# Patient Record
Sex: Male | Born: 1950 | Race: Black or African American | Hispanic: No | Marital: Single | Smoking: Former smoker
Health system: Southern US, Community
[De-identification: ages and names within clinical notes are randomized; demographics above are authoritative.]

## PROBLEM LIST (undated history)

## (undated) DIAGNOSIS — I5189 Other ill-defined heart diseases: Secondary | ICD-10-CM

## (undated) DIAGNOSIS — N183 Chronic kidney disease, stage 3 unspecified: Secondary | ICD-10-CM

## (undated) DIAGNOSIS — R197 Diarrhea, unspecified: Secondary | ICD-10-CM

## (undated) DIAGNOSIS — D638 Anemia in other chronic diseases classified elsewhere: Secondary | ICD-10-CM

## (undated) DIAGNOSIS — E119 Type 2 diabetes mellitus without complications: Secondary | ICD-10-CM

## (undated) DIAGNOSIS — I951 Orthostatic hypotension: Secondary | ICD-10-CM

## (undated) DIAGNOSIS — D472 Monoclonal gammopathy: Secondary | ICD-10-CM

## (undated) DIAGNOSIS — I10 Essential (primary) hypertension: Secondary | ICD-10-CM

## (undated) DIAGNOSIS — R55 Syncope and collapse: Secondary | ICD-10-CM

## (undated) DIAGNOSIS — E039 Hypothyroidism, unspecified: Secondary | ICD-10-CM

## (undated) DIAGNOSIS — H5440 Blindness, one eye, unspecified eye: Secondary | ICD-10-CM

## (undated) HISTORY — DX: Syncope and collapse: R55

## (undated) HISTORY — DX: Chronic kidney disease, stage 3 unspecified: N18.30

## (undated) HISTORY — DX: Chronic kidney disease, stage 3 (moderate): N18.3

## (undated) HISTORY — DX: Blindness, one eye, unspecified eye: H54.40

## (undated) HISTORY — DX: Type 2 diabetes mellitus without complications: E11.9

## (undated) HISTORY — DX: Essential (primary) hypertension: I10

## (undated) HISTORY — PX: THYROID SURGERY: SHX805

---

## 2006-07-14 ENCOUNTER — Other Ambulatory Visit: Payer: Self-pay

## 2006-07-14 ENCOUNTER — Inpatient Hospital Stay: Payer: Self-pay | Admitting: Internal Medicine

## 2011-04-10 ENCOUNTER — Emergency Department: Payer: Self-pay | Admitting: Emergency Medicine

## 2012-04-04 ENCOUNTER — Emergency Department: Payer: Self-pay | Admitting: Internal Medicine

## 2013-04-18 DIAGNOSIS — N62 Hypertrophy of breast: Secondary | ICD-10-CM | POA: Insufficient documentation

## 2013-06-16 DIAGNOSIS — H44522 Atrophy of globe, left eye: Secondary | ICD-10-CM | POA: Insufficient documentation

## 2013-06-16 DIAGNOSIS — E11319 Type 2 diabetes mellitus with unspecified diabetic retinopathy without macular edema: Secondary | ICD-10-CM | POA: Insufficient documentation

## 2013-10-10 ENCOUNTER — Inpatient Hospital Stay: Payer: Self-pay | Admitting: Internal Medicine

## 2013-10-10 LAB — BASIC METABOLIC PANEL
ANION GAP: 5 — AB (ref 7–16)
BUN: 26 mg/dL — ABNORMAL HIGH (ref 7–18)
CHLORIDE: 88 mmol/L — AB (ref 98–107)
Calcium, Total: 9.2 mg/dL (ref 8.5–10.1)
Co2: 28 mmol/L (ref 21–32)
Creatinine: 2.31 mg/dL — ABNORMAL HIGH (ref 0.60–1.30)
EGFR (African American): 34 — ABNORMAL LOW
EGFR (Non-African Amer.): 29 — ABNORMAL LOW
Glucose: 693 mg/dL (ref 65–99)
OSMOLALITY: 282 (ref 275–301)
Potassium: 6.6 mmol/L (ref 3.5–5.1)
SODIUM: 121 mmol/L — AB (ref 136–145)

## 2013-10-10 LAB — CBC
HCT: 40.6 % (ref 40.0–52.0)
HGB: 13.2 g/dL (ref 13.0–18.0)
MCH: 26.9 pg (ref 26.0–34.0)
MCHC: 32.5 g/dL (ref 32.0–36.0)
MCV: 83 fL (ref 80–100)
PLATELETS: 217 10*3/uL (ref 150–440)
RBC: 4.91 10*6/uL (ref 4.40–5.90)
RDW: 12.5 % (ref 11.5–14.5)
WBC: 17 10*3/uL — ABNORMAL HIGH (ref 3.8–10.6)

## 2013-10-10 LAB — TROPONIN I
TROPONIN-I: 0.05 ng/mL
TROPONIN-I: 0.05 ng/mL
Troponin-I: 0.05 ng/mL

## 2013-10-10 LAB — CK TOTAL AND CKMB (NOT AT ARMC)
CK, TOTAL: 101 U/L (ref 35–232)
CK, TOTAL: 102 U/L (ref 35–232)
CK, Total: 95 U/L (ref 35–232)
CK-MB: 0.7 ng/mL (ref 0.5–3.6)
CK-MB: 0.7 ng/mL (ref 0.5–3.6)
CK-MB: 0.7 ng/mL (ref 0.5–3.6)

## 2013-10-10 LAB — RAPID INFLUENZA A&B ANTIGENS (ARMC ONLY)

## 2013-10-10 LAB — POTASSIUM: POTASSIUM: 5.3 mmol/L — AB (ref 3.5–5.1)

## 2013-10-11 LAB — CBC WITH DIFFERENTIAL/PLATELET
BASOS PCT: 0.1 %
Basophil #: 0 10*3/uL (ref 0.0–0.1)
EOS ABS: 0 10*3/uL (ref 0.0–0.7)
Eosinophil %: 0.2 %
HCT: 33.8 % — ABNORMAL LOW (ref 40.0–52.0)
HGB: 11.3 g/dL — ABNORMAL LOW (ref 13.0–18.0)
LYMPHS ABS: 0.8 10*3/uL — AB (ref 1.0–3.6)
Lymphocyte %: 4.3 %
MCH: 27.4 pg (ref 26.0–34.0)
MCHC: 33.4 g/dL (ref 32.0–36.0)
MCV: 82 fL (ref 80–100)
Monocyte #: 0.8 x10 3/mm (ref 0.2–1.0)
Monocyte %: 4.5 %
NEUTROS PCT: 90.9 %
Neutrophil #: 16.2 10*3/uL — ABNORMAL HIGH (ref 1.4–6.5)
Platelet: 182 10*3/uL (ref 150–440)
RBC: 4.12 10*6/uL — ABNORMAL LOW (ref 4.40–5.90)
RDW: 12.6 % (ref 11.5–14.5)
WBC: 17.9 10*3/uL — ABNORMAL HIGH (ref 3.8–10.6)

## 2013-10-11 LAB — BASIC METABOLIC PANEL
Anion Gap: 4 — ABNORMAL LOW (ref 7–16)
BUN: 30 mg/dL — AB (ref 7–18)
CHLORIDE: 98 mmol/L (ref 98–107)
Calcium, Total: 8.5 mg/dL (ref 8.5–10.1)
Co2: 31 mmol/L (ref 21–32)
Creatinine: 2.25 mg/dL — ABNORMAL HIGH (ref 0.60–1.30)
EGFR (African American): 35 — ABNORMAL LOW
EGFR (Non-African Amer.): 30 — ABNORMAL LOW
Glucose: 121 mg/dL — ABNORMAL HIGH (ref 65–99)
OSMOLALITY: 274 (ref 275–301)
POTASSIUM: 3.7 mmol/L (ref 3.5–5.1)
SODIUM: 133 mmol/L — AB (ref 136–145)

## 2013-10-12 LAB — CBC WITH DIFFERENTIAL/PLATELET
Basophil #: 0 10*3/uL (ref 0.0–0.1)
Basophil %: 0.1 %
Eosinophil #: 0.1 10*3/uL (ref 0.0–0.7)
Eosinophil %: 0.6 %
HCT: 30.8 % — ABNORMAL LOW (ref 40.0–52.0)
HGB: 10.1 g/dL — AB (ref 13.0–18.0)
Lymphocyte #: 0.9 10*3/uL — ABNORMAL LOW (ref 1.0–3.6)
Lymphocyte %: 4.6 %
MCH: 26.7 pg (ref 26.0–34.0)
MCHC: 33 g/dL (ref 32.0–36.0)
MCV: 81 fL (ref 80–100)
Monocyte #: 1.3 x10 3/mm — ABNORMAL HIGH (ref 0.2–1.0)
Monocyte %: 6.9 %
NEUTROS ABS: 16.4 10*3/uL — AB (ref 1.4–6.5)
NEUTROS PCT: 87.8 %
PLATELETS: 174 10*3/uL (ref 150–440)
RBC: 3.79 10*6/uL — ABNORMAL LOW (ref 4.40–5.90)
RDW: 12.5 % (ref 11.5–14.5)
WBC: 18.7 10*3/uL — ABNORMAL HIGH (ref 3.8–10.6)

## 2013-10-12 LAB — BASIC METABOLIC PANEL
ANION GAP: 4 — AB (ref 7–16)
BUN: 21 mg/dL — AB (ref 7–18)
CHLORIDE: 103 mmol/L (ref 98–107)
Calcium, Total: 8.1 mg/dL — ABNORMAL LOW (ref 8.5–10.1)
Co2: 29 mmol/L (ref 21–32)
Creatinine: 1.75 mg/dL — ABNORMAL HIGH (ref 0.60–1.30)
EGFR (African American): 47 — ABNORMAL LOW
GFR CALC NON AF AMER: 41 — AB
GLUCOSE: 93 mg/dL (ref 65–99)
Osmolality: 275 (ref 275–301)
POTASSIUM: 3.3 mmol/L — AB (ref 3.5–5.1)
Sodium: 136 mmol/L (ref 136–145)

## 2013-10-13 DIAGNOSIS — I517 Cardiomegaly: Secondary | ICD-10-CM

## 2013-10-13 LAB — CREATININE, SERUM
Creatinine: 1.65 mg/dL — ABNORMAL HIGH (ref 0.60–1.30)
GFR CALC AF AMER: 50 — AB
GFR CALC NON AF AMER: 44 — AB

## 2013-10-14 LAB — CBC WITH DIFFERENTIAL/PLATELET
Basophil #: 0 10*3/uL (ref 0.0–0.1)
Basophil %: 0.2 %
Eosinophil #: 0.1 10*3/uL (ref 0.0–0.7)
Eosinophil %: 0.9 %
HCT: 31.1 % — ABNORMAL LOW (ref 40.0–52.0)
HGB: 10.3 g/dL — ABNORMAL LOW (ref 13.0–18.0)
LYMPHS PCT: 5 %
Lymphocyte #: 0.6 10*3/uL — ABNORMAL LOW (ref 1.0–3.6)
MCH: 26.9 pg (ref 26.0–34.0)
MCHC: 33.1 g/dL (ref 32.0–36.0)
MCV: 81 fL (ref 80–100)
MONOS PCT: 12.3 %
Monocyte #: 1.5 x10 3/mm — ABNORMAL HIGH (ref 0.2–1.0)
Neutrophil #: 9.9 10*3/uL — ABNORMAL HIGH (ref 1.4–6.5)
Neutrophil %: 81.6 %
Platelet: 197 10*3/uL (ref 150–440)
RBC: 3.83 10*6/uL — ABNORMAL LOW (ref 4.40–5.90)
RDW: 13 % (ref 11.5–14.5)
WBC: 12.1 10*3/uL — ABNORMAL HIGH (ref 3.8–10.6)

## 2013-10-14 LAB — BASIC METABOLIC PANEL
Anion Gap: 5 — ABNORMAL LOW (ref 7–16)
BUN: 11 mg/dL (ref 7–18)
CHLORIDE: 106 mmol/L (ref 98–107)
CO2: 27 mmol/L (ref 21–32)
Calcium, Total: 8.1 mg/dL — ABNORMAL LOW (ref 8.5–10.1)
Creatinine: 1.61 mg/dL — ABNORMAL HIGH (ref 0.60–1.30)
EGFR (Non-African Amer.): 45 — ABNORMAL LOW
GFR CALC AF AMER: 52 — AB
Glucose: 104 mg/dL — ABNORMAL HIGH (ref 65–99)
Osmolality: 275 (ref 275–301)
POTASSIUM: 3.9 mmol/L (ref 3.5–5.1)
Sodium: 138 mmol/L (ref 136–145)

## 2013-10-14 LAB — VANCOMYCIN, TROUGH: Vancomycin, Trough: 11 ug/mL (ref 10–20)

## 2013-10-15 LAB — CULTURE, BLOOD (SINGLE)

## 2013-10-16 LAB — CREATININE, SERUM
Creatinine: 1.52 mg/dL — ABNORMAL HIGH (ref 0.60–1.30)
GFR CALC AF AMER: 56 — AB
GFR CALC NON AF AMER: 48 — AB

## 2013-10-16 LAB — VANCOMYCIN, TROUGH: Vancomycin, Trough: 18 ug/mL (ref 10–20)

## 2013-10-20 LAB — EXPECTORATED SPUTUM ASSESSMENT W REFEX TO RESP CULTURE

## 2013-10-31 ENCOUNTER — Encounter: Payer: Self-pay | Admitting: Pulmonary Disease

## 2013-10-31 ENCOUNTER — Ambulatory Visit (INDEPENDENT_AMBULATORY_CARE_PROVIDER_SITE_OTHER): Payer: Medicare Other | Admitting: Pulmonary Disease

## 2013-10-31 VITALS — BP 116/60 | HR 85 | Temp 98.4°F | Ht 70.0 in | Wt 178.0 lb

## 2013-10-31 DIAGNOSIS — R079 Chest pain, unspecified: Secondary | ICD-10-CM

## 2013-10-31 DIAGNOSIS — J9611 Chronic respiratory failure with hypoxia: Secondary | ICD-10-CM | POA: Insufficient documentation

## 2013-10-31 DIAGNOSIS — R0902 Hypoxemia: Secondary | ICD-10-CM

## 2013-10-31 DIAGNOSIS — J9610 Chronic respiratory failure, unspecified whether with hypoxia or hypercapnia: Secondary | ICD-10-CM

## 2013-10-31 DIAGNOSIS — J189 Pneumonia, unspecified organism: Secondary | ICD-10-CM | POA: Insufficient documentation

## 2013-10-31 NOTE — Assessment & Plan Note (Signed)
His chest pain is atypical but it is left-sided and exertional so with his prior smoking history I think it is reasonable for him to see doctors Rockey Situ and Fletcher Anon for possibly a stress test or further workup.

## 2013-10-31 NOTE — Patient Instructions (Signed)
Go get a chest x-ray at the [facility] office this week Use Mucinex (guaifenesin) liquid or tablet twice a day as needed to help break up the mucus in your chest We will refer you to Dr. Rockey Situ and Fletcher Anon for the chest pain We will see you back in 2-3 months or sooner if needed

## 2013-10-31 NOTE — Assessment & Plan Note (Signed)
Cameron Adams had severe community acquired pneumonia but is making slow steady progress since leaving the hospital.  Today we checked for COPD with simple spirometry and it was normal so he does not have COPD.  It appears that he is progressing as one would expect a 63 year old gentleman with poorly controlled diabetes to progress after severe community acquired pneumonia. In fact, I think he is lucky that he survive this illness. I am happy to see that he has made slow progress.  Plan: - Use Mucinex with chest congestion -Repeat chest x-ray now to ensure that radiographic changes are not worsens -Plan repeat chest x-ray in 2-3 months with an office visit, hopefully by then we will see complete resolution

## 2013-10-31 NOTE — Assessment & Plan Note (Signed)
We checked his oxygenation today in the office and he still needs 2 L nasal cannula continuously with both rest and exertion. In fact, his O2 saturation was 89% on room air but with any movement he would drop to think it's best for him to stay on continuous therapy at this point.

## 2013-10-31 NOTE — Progress Notes (Signed)
Subjective:    Patient ID: Cameron Adams, male    DOB: 02-02-1951, 63 y.o.   MRN: [id number]  HPI  Cameron Adams is here to see me for follow up from a recent hospitalziation for severe pneumonia. On Christmas day he got cold symptoms and he started taking over the counter cough and cold medicines.  He took the medicines but his symptoms worsened.  He started taking mucinex in addition and tried this with other OTC medicines for a week at home. None of the medicines helped so he ended up going to the [facility] ED and was admitted.  Apparently his blood sugar went up a lot just before the admission.  He was admitted to the ICU and was treated with IV insulin.  His total hospital stay was 7 days and discharged to a SNF.  He has been at the SNF for three weeks now.  He has been participating in physical therapy daily and he has been taking his medicines regularly.  His appetite has improved quite a bit.   He still has some problems with shortness of breath on exertion.  He feels that he is getting stronger and yesterday he was able to leave the facility and make it out for a few hours without oxygen.  He is still coughing but not as bad as before.  He feels chest congestion is taking a while to get out.  He still sees yellow sputum.   Before going to the hospital he noticed regurgitation of meats and acid reflux.  He treated this by avoiding foods that would cause reflux.   Childhood was normal without asthma.  He had pneumonia once in his early 20's and he was hospitalized for this.  Apparently he underwent a bronchoscopy at [facility] as an inpatient for this.    He previously smoked about 3 cigarettes a day and quit in his late 30's-early 40's, some days he would smoke more than this.  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  No respiratory problems after this.     He feels chest pressure in the left chest and shoulder with exertion.  This has been going on for a few months.     Past Medical History  Diagnosis Date  . Diabetes   . Thyroid dysfunction      Family History  Problem Relation Age of Onset  . Heart disease Brother   . Heart disease Mother      History   Social History  . Marital Status: Single    Spouse Name: N/A    Number of Children: N/A  . Years of Education: N/A   Occupational History  . Not on file.   Social History Main Topics  . Smoking status: Former Smoker -- 0.10 packs/day for 12 years    Types: Cigarettes    Quit date: 10/31/1978  . Smokeless tobacco: Never Used     Comment: smoked 2-3 cigarettes/day  . Alcohol Use: No  . Drug Use: Not on file  . Sexual Activity: Not on file   Other Topics Concern  . Not on file   Social History Narrative  . No narrative on file     Not on File   No outpatient prescriptions prior to visit.   No facility-administered medications prior to visit.      Review of Systems  Constitutional: Positive for appetite change and unexpected weight change. Negative for fever.  HENT: Positive for dental problem. Negative for congestion, ear pain, nosebleeds, postnasal drip, rhinorrhea, sinus pressure, sneezing, sore throat and trouble swallowing.   Eyes: Negative for redness and itching.  Respiratory: Positive for shortness of breath. Negative for cough, chest tightness and wheezing.   Cardiovascular: Positive for leg swelling. Negative for palpitations.  Gastrointestinal: Negative for nausea and vomiting.  Genitourinary: Negative for dysuria.  Musculoskeletal: Positive for joint swelling.  Skin: Negative for rash.  Neurological: Negative for headaches.  Hematological: Does not bruise/bleed easily.  Psychiatric/Behavioral: Negative for dysphoric mood. The patient is not nervous/anxious.        Objective:   Physical Exam  Filed Vitals:   10/31/13 1439  BP: 116/60   Pulse: 85  Temp: 98.4 F (36.9 C)  TempSrc: Oral  Height: 5\' 10"  (1.778 m)  Weight: 178 lb (80.74 kg)  SpO2: 95%  2 L [place]  Gen: well appearing, no acute distress HEENT: NCAT, PERRL, EOMi, OP clear, neck supple without masses PULM: CTA B CV: RRR, no mgr, no JVD AB: BS+, soft, nontender, no hsm Ext: warm, no edema, no clubbing, no cyanosis Derm: no rash or skin breakdown Neuro: A&Ox4, CN II-XII intact, strength 5/5 in all 4 extremities       Assessment & Plan:   CAP (community acquired pneumonia) Cameron Adams had severe community acquired pneumonia but is making slow steady progress since leaving the hospital.  Today we checked for COPD with simple spirometry and it was normal so he does not have COPD.  It appears that he is progressing as one would expect a 63 year old gentleman with poorly controlled diabetes to progress after severe community acquired pneumonia. In fact, I think he is lucky that he survive this illness. I am happy to see that he has made slow progress.  Plan: - Use Mucinex with chest congestion -Repeat chest x-ray now to ensure that radiographic changes are not worsens -Plan repeat chest x-ray in 2-3 months with an office visit, hopefully by then we will see complete resolution  Chronic respiratory failure with hypoxia We checked his oxygenation today in the office and he still needs 2 L nasal cannula continuously with both rest and exertion. In fact, his O2 saturation was 89% on room air but with any movement he would drop to think it's best for him to stay on continuous therapy at this point.  Chest pain His chest pain is atypical but it is left-sided and exertional so with his prior smoking history I think it is reasonable for him to see doctors Rockey Situ and Arida for possibly a stress test or further workup.   Updated Medication List Outpatient Encounter Prescriptions as of 10/31/2013  Medication Sig  . aspirin 81 MG tablet Take 81 mg by mouth daily.   . citalopram (CELEXA) 20 MG tablet Take 20 mg by mouth daily.  . insulin aspart protamine- aspart (NOVOLOG MIX 70/30) (70-30) 100 UNIT/ML injection Inject into the skin 2 (two) times daily with a meal. 40 units a.m., 25 units p.m.  Marland Kitchen levothyroxine (SYNTHROID, LEVOTHROID) 175 MCG tablet Take 175 mcg by mouth daily before breakfast.

## 2013-11-01 ENCOUNTER — Ambulatory Visit: Payer: Medicare Other | Admitting: Cardiology

## 2013-11-07 ENCOUNTER — Encounter: Payer: Self-pay | Admitting: Pulmonary Disease

## 2013-11-07 ENCOUNTER — Telehealth: Payer: Self-pay

## 2013-11-07 NOTE — Telephone Encounter (Signed)
lmtcb X1 

## 2013-11-07 NOTE — Telephone Encounter (Signed)
Message copied by Len Blalock on Mon Nov 07, 2013 10:20 AM ------      Message from: Simonne Maffucci B      Created: Mon Nov 07, 2013 10:00 AM       A,            Please let him know that his CXR showed that his pneumonia is clearing up slowly.  We will get another CXR with the next visit.            Thanks      B ------

## 2013-11-07 NOTE — Telephone Encounter (Signed)
Pt return call °

## 2013-11-07 NOTE — Telephone Encounter (Signed)
Pt aware of cxr results and recs.  Nothing further needed.

## 2013-11-09 ENCOUNTER — Encounter: Payer: Self-pay | Admitting: Cardiovascular Disease

## 2013-11-09 ENCOUNTER — Ambulatory Visit (INDEPENDENT_AMBULATORY_CARE_PROVIDER_SITE_OTHER): Payer: Medicare Other | Admitting: Cardiovascular Disease

## 2013-11-09 VITALS — BP 92/50 | HR 82 | Ht 71.0 in | Wt 178.8 lb

## 2013-11-09 DIAGNOSIS — R0989 Other specified symptoms and signs involving the circulatory and respiratory systems: Secondary | ICD-10-CM

## 2013-11-09 DIAGNOSIS — R9431 Abnormal electrocardiogram [ECG] [EKG]: Secondary | ICD-10-CM

## 2013-11-09 DIAGNOSIS — R0609 Other forms of dyspnea: Secondary | ICD-10-CM

## 2013-11-09 DIAGNOSIS — R0600 Dyspnea, unspecified: Secondary | ICD-10-CM

## 2013-11-09 DIAGNOSIS — R0602 Shortness of breath: Secondary | ICD-10-CM

## 2013-11-09 NOTE — Assessment & Plan Note (Signed)
Cameron Adams presents today for further evaluation of his dyspnea. He was recently admitted to the hospital with pneumonia. He complains of having shortness of breath prior to his hospitalization and also since his hospitalization. He has been seen by Dr. Lake Bells of pulmonary and was told that he did not have COPD.  He does have some nonspecific ST changes in the inferior leads of  his EKG.  We will schedule him for a Lexiscan Myoview.  Given his dyspnea, we will also get an echo.    I will see him again in 2 months.

## 2013-11-09 NOTE — Patient Instructions (Addendum)
Your physician has requested that you have an echocardiogram. Echocardiography is a painless test that uses sound waves to create images of your heart. It provides your doctor with information about the size and shape of your heart and how well your heart's chambers and valves are working. This procedure takes approximately one hour. There are no restrictions for this procedure.  [place]  Your caregiver has ordered a Stress Test with nuclear imaging. The purpose of this test is to evaluate the blood supply to your heart muscle. This procedure is referred to as a "Non-Invasive Stress Test." This is because other than having an IV started in your vein, nothing is inserted or "invades" your body. Cardiac stress tests are done to find areas of poor blood flow to the heart by determining the extent of coronary artery disease (CAD). Some patients exercise on a treadmill, which naturally increases the blood flow to your heart, while others who are  unable to walk on a treadmill due to physical limitations have a pharmacologic/chemical stress agent called Lexiscan . This medicine will mimic walking on a treadmill by temporarily increasing your coronary blood flow.   Please note: these test may take anywhere between 2-4 hours to complete  PLEASE REPORT TO [place] AT THE FIRST DESK WILL DIRECT YOU WHERE TO GO  Date of Procedure:___________02/11/15__________________  Arrival Time for Procedure:_________10:00 am___________   PLEASE NOTIFY THE OFFICE AT LEAST 24 HOURS IN ADVANCE IF YOU ARE UNABLE TO KEEP YOUR APPOINTMENT.  [phone] AND  PLEASE NOTIFY NUCLEAR MEDICINE AT [facility] AT LEAST 24 HOURS IN ADVANCE IF YOU ARE UNABLE TO KEEP YOUR APPOINTMENT. [phone]     Hold insulin the morning of your stress test!   How to prepare for your Myoview test:  1. Do not eat or drink after midnight 2. No caffeine for 24 hours prior to test 3. No smoking 24 hours prior  to test. 4. Your medication may be taken with water.  If your doctor stopped a medication because of this test, do not take that medication. 5. Ladies, please do not wear dresses.  Skirts or pants are appropriate. Please wear a short sleeve shirt. 6. No perfume, cologne or lotion. 7. Wear comfortable walking shoes. No heels!    Your physician recommends that you schedule a follow-up appointment in:  2 months   Call Elmyra Ricks at [phone] if you can not do stress test on Wednesday 11/16/13

## 2013-11-09 NOTE — Progress Notes (Signed)
Cameron Adams Date of Birth  Mar 18, 1951       [id number], [address], [place], [place]  [address]   [place], [place]  [address] [place]   Fax  [phone]    Fax [phone]  Problem List: 1. Diabetes mellitus 2. Hypothyroidism 3. Anxiety 4. Hypertension 5. Chronic kidney disease, stage III   History of Present Illness:  Feb. 4, 2015:  Cameron Adams is a 63 yo with hx of DM, HTN, CKD.  He was admitted recently to [facility] with pneumonia. He still has occasional cough - not often.   He was concerned about his heart.   He presents today mostly as a self referral.   He still has dyspnea  - especially with exertion.    Denies any chest pain.  He just started getting exercise - PT 7 days a week.  He is semi-retired.    Current Outpatient Prescriptions on File Prior to Visit  Medication Sig Dispense Refill  . aspirin 81 MG tablet Take 81 mg by mouth daily.      . citalopram (CELEXA) 20 MG tablet Take 20 mg by mouth daily.      . insulin aspart protamine- aspart (NOVOLOG MIX 70/30) (70-30) 100 UNIT/ML injection Inject into the skin 2 (two) times daily with a meal. 40 units a.m., 25 units p.m.      Marland Kitchen levothyroxine (SYNTHROID, LEVOTHROID) 175 MCG tablet Take 175 mcg by mouth daily before breakfast.       No current facility-administered medications on file prior to visit.    No Known Allergies  Past Medical History  Diagnosis Date  . Diabetes   . Thyroid dysfunction   . Syncope and collapse   . Hypertension   . Blindness of left eye   . Chronic kidney disease, stage III (moderate)     Past Surgical History  Procedure Laterality Date  . Thyroid surgery      History  Smoking status  . Former Smoker -- 0.10 packs/day for 25 years  . Types: Cigarettes  . Quit date: 10/31/1978  Smokeless tobacco  . Never Used    Comment: smoked 2-3 cigarettes/day    History  Alcohol Use No     Family History  Problem Relation Age of Onset  . Heart disease Brother   . Kidney failure Mother   . Heart disease Father   . Kidney failure Father     Reviw of Systems:  Reviewed in the HPI.  All other systems are negative.  Physical Exam: Blood pressure 92/50, pulse 82, height 5\' 11"  (1.803 m), weight 178 lb 12 oz (81.08 kg). Wt Readings from Last 3 Encounters:  11/09/13 178 lb 12 oz (81.08 kg)  10/31/13 178 lb (80.74 kg)     General: Well developed, well nourished, in no acute distress. Head: Normocephalic,  Blind in left eye, mucus membranes are moist,  Neck: Supple. Carotids are 2 + without bruits. No JVD  Lungs: Clear  Heart: RR, soft systolic murmur Abdomen: Soft, non-tender, non-distended with normal bowel sounds. Msk:  Strength and tone are normal  Extremities: No clubbing or cyanosis. No edema.  Distal pedal pulses are 2+ and equal  Neuro: CN II - XII intact.  Alert and oriented X 3.  Psych:  Normal   ECG: Feb. 4, 2015:  NSR at 83.  T wave abn. Inferiorly.  RVH.  Assessment / Plan:

## 2013-11-16 ENCOUNTER — Ambulatory Visit: Payer: Self-pay

## 2013-11-17 ENCOUNTER — Other Ambulatory Visit: Payer: Self-pay

## 2013-11-17 DIAGNOSIS — R0602 Shortness of breath: Secondary | ICD-10-CM

## 2013-11-17 DIAGNOSIS — R9431 Abnormal electrocardiogram [ECG] [EKG]: Secondary | ICD-10-CM

## 2013-11-18 ENCOUNTER — Other Ambulatory Visit (INDEPENDENT_AMBULATORY_CARE_PROVIDER_SITE_OTHER): Payer: Medicare Other

## 2013-11-18 ENCOUNTER — Other Ambulatory Visit (HOSPITAL_COMMUNITY): Payer: Self-pay | Admitting: *Deleted

## 2013-11-18 ENCOUNTER — Other Ambulatory Visit: Payer: Self-pay

## 2013-11-18 ENCOUNTER — Telehealth: Payer: Self-pay | Admitting: *Deleted

## 2013-11-18 DIAGNOSIS — R0600 Dyspnea, unspecified: Secondary | ICD-10-CM

## 2013-11-18 DIAGNOSIS — R0602 Shortness of breath: Secondary | ICD-10-CM

## 2013-11-18 DIAGNOSIS — R9431 Abnormal electrocardiogram [ECG] [EKG]: Secondary | ICD-10-CM

## 2013-11-18 NOTE — Telephone Encounter (Signed)
If he has any worsening symptoms, he should go to the er

## 2013-11-18 NOTE — Telephone Encounter (Signed)
Patient was in for echo today and stated that he was worried he had a stroke and has been feeling funny. I asked him several questions and performed NIH stroke scale exam. He had no deficits on either side that I could see. Upon further questioning he stated that his blood pressure has been running lower than usual. 90's/50's on average. His pressure today was 102/50. He has been feeling dizzy and weak. I instructed him to change positions slowly and wait before taking steps away from his seat when he goes from sitting standing. I told him I would let Dr. Acie Fredrickson know about his symptoms.

## 2013-11-21 NOTE — Telephone Encounter (Signed)
LVM 2/16

## 2013-11-21 NOTE — Telephone Encounter (Signed)
Instructed patient to monitor blood pressure and call if it sustains below 90/50 to call us right away and per Dr. Acie Fredrickson go to ED if he feels he is having stroke symptoms. Patient verbalized understanding.

## 2014-01-16 ENCOUNTER — Ambulatory Visit: Payer: Medicare Other | Admitting: Cardiovascular Disease

## 2014-01-16 ENCOUNTER — Encounter: Payer: Self-pay | Admitting: *Deleted

## 2014-09-16 DIAGNOSIS — R45851 Suicidal ideations: Secondary | ICD-10-CM | POA: Insufficient documentation

## 2014-09-16 DIAGNOSIS — F329 Major depressive disorder, single episode, unspecified: Secondary | ICD-10-CM | POA: Insufficient documentation

## 2014-10-19 ENCOUNTER — Emergency Department: Payer: Self-pay | Admitting: Emergency Medicine

## 2015-01-27 NOTE — Discharge Summary (Signed)
PATIENT NAME:  Cameron Adams, Cameron Adams MR#:  [id number] DATE OF BIRTH:  06-20-1951  DATE OF ADMISSION:  10/10/2013 DATE OF DISCHARGE:  10/17/2013   Please refer to interim discharge summary dictated by Dr. Darvin Neighbours on 11th of January.   FINAL DISCHARGE DIAGNOSES: 1.  Sepsis from bilateral multilobar pneumonia, improving on antibiotic, down to 2 liters of oxygen now.  2.  Acute on chronic kidney disease, stage III, with creatinine of 1.5, likely at baseline, prerenal. 3.  Chest pressure, now resolved. May need outpatient stress test.  4.  Uncontrolled diabetes with hyperglycemia, improving.   SECONDARY DIAGNOSES: As dictated by Dr. Darvin Neighbours in the interim discharge summary.   CONSULTATIONS: Pulmonary, Dr. Devona Konig.   PROCEDURES AND RADIOLOGY: As dictated by Dr. Darvin Neighbours in the interim discharge summary on 11th of January. No new procedures or radiology were obtained subsequent to that.   MAJOR LABORATORY PANEL: As dictated by Dr. Darvin Neighbours.   HISTORY AND SHORT HOSPITAL COURSE: The patient is a 64 year old male with above-mentioned medical problems who was admitted for bilateral pneumonia with hyperglycemia and acute renal failure. Please see Dr. Graciela Husbands dictated history and physical for further details. The patient was started on broad-spectrum IV antibiotics. Please see Dr. Boykin Reaper dictated interim discharge summary on 11th of January for detailed course from admission until January 11. Subsequently, the patient was waiting for oxygen weaning to 2 liters, which has happened by now and he is down to 2 liters. He was also waiting for bed placement, which he has now, and he is being discharged to skilled nursing facility, after discussion with the patient's wife and patient who are both in agreement with the discharge planning.   On the date of discharge, his vital signs are as follows: Temperature 98.9, heart rate 85 per minute, respirations 19 per minute, blood pressure 132/72 mmHg. He was saturating 99%  on 2 liters oxygen via nasal cannula.   PERTINENT PHYSICAL EXAMINATION: On the date of discharge: CARDIOVASCULAR: S1, S2 normal. No murmurs, rubs, or gallop.  LUNGS: Clear to auscultation bilaterally. No wheezing, rales, rhonchi, or crepitation.  ABDOMEN: Soft, benign.  NEUROLOGIC: Nonfocal examination. All other physical examination remained at baseline.   DISCHARGE MEDICATIONS: 1.  Novolin 70/30, 25 units subcutaneous daily in the evening.  2.  Synthroid 175 mcg p.o. daily. 3.  Citalopram 40 mg p.o. daily.  4.  Novolin 70/30, 40 units subcutaneous every morning.  5.  Augmentin 875/125 mg 1 tablet p.o. b.i.d. for 7 days.   DISCHARGE DIET: Low-sodium, low-fat, low-cholesterol, 1800 ADA.   DISCHARGE ACTIVITY: As tolerated.   DISCHARGE INSTRUCTIONS AND FOLLOWUP: The patient was instructed to follow up with his primary care physician at the facility where he is being discharged sometime this week. He will need followup with [facility], Dr. Simonne Maffucci, in 2 to 4 weeks. He will also need followup with Dr. Fletcher Anon from [facility] cardiology in 4 to 6 weeks. He will get physical therapy evaluation and management while at the facility. He will need oxygen weaning trial while at the facility in the next 1 to 2 weeks as his pneumonia antibiotic gets finished.   TOTAL TIME DISCHARGING THIS PATIENT: 55 minutes.    ____________________________ Lucina Mellow. Manuella Ghazi, MD vss:jcm D: 10/17/2013 17:01:40 ET T: 10/17/2013 17:31:11 ET JOB#: [id number]  Enclosure: Interim Discharge Summary, 10/16/2013  cc: Calypso Hagarty S. Manuella Ghazi, MD, <Dictator> Primary Care Physician Juanito Doom, MD Mertie Clause Fletcher Anon, [place] MD ELECTRONICALLY SIGNED 10/19/2013 23:25

## 2015-01-27 NOTE — H&P (Signed)
PATIENT NAME:  Cameron Adams, Cameron Adams MR#:  [id number] DATE OF BIRTH:  Feb 18, 1951  DATE OF ADMISSION:  10/10/2013  REFERRING PHYSICIAN: Lurline Hare, MD  PRIMARY CARE PHYSICIAN: Tora Duck, MD [facility])  CHIEF COMPLAINT: Cough, shortness of breath, fever.   HISTORY OF PRESENT ILLNESS: This is a 64 year old male with significant past medical history of diabetes mellitus, anxiety, and hypothyroidism. The patient presents with complaints of cough for the last week. As well he reports having fever and chills at home and having flulike symptoms as well as having sweating, sore throat, and cough productive with yellow-green sputum in color. As well he reports some chest pain mainly related to cough. The patient's chest x-ray did show what appears to be evolving bilateral lung opacities. As well the patient has significant leukocytosis at 17,000. He had a low-grade temp of 99.6. The patient had blood cultures sent and was started on IV antibiotics for his pneumonia. As well the patient was noticed to have multiple lab abnormalities most significantly for hyperkalemia at 6.6. He received p.o. Kayexalate, received 1 gram of IV calcium gluconate. As well he received subcu NovoLog insulin. The patient has had significantly elevated hyperglycemia at glucose of 693. The patient reports he has been compliant with his insulin. The patient was not in DKA. The patient's sodium was 121 which once corrected his hyperglycemia was around 130, mildly low. Reports he had decreased p.o. intake and fluid intake as well. The patient currently denies any chest pain. Reports his chest pain is mainly with coughing. His EKG did show a right bundle branch block, but we have no baseline EKG for him here. The patient was ordered 324 mg of aspirin. Hospitalist service was requested to admit the patient for further treatment of his pneumonia.   PAST MEDICAL HISTORY:   1.  Diabetes mellitus, on insulin. 2.  Hypertension.  3.   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  4.  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   ALLERGIES: No known drug allergies.   HOME MEDICATIONS: 1.  Novolin 70/30, 40 units in the morning and 25 units in the evening.  2.  Citalopram 40 mg 2 tablets oral daily.  3.  Synthroid 175 mcg oral daily.   FAMILY HISTORY: Significant for hypertension and coronary artery disease.   SOCIAL HISTORY: The patient has history of remote smoking. No alcohol. No illicit drug use.   REVIEW OF SYSTEMS:  CONSTITUTIONAL: Reports fever, chills, fatigue, weakness, poor appetite.  EYES: Denies blurry vision, double vision. He has left eye blindness due to retinal detachment. 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  ENT: Denies tinnitus, ear pain, hearing loss, epistaxis. Reports sore throat and flulike symptoms.  RESPIRATORY: Complains of cough, shortness of breath, productive sputum, green/yellow in color.  CARDIOVASCULAR: Reports chest pain with coughing. Denies any edema, arrhythmia, palpitations, syncope.  GASTROINTESTINAL: Denies nausea, vomiting, diarrhea, abdominal pain, hematemesis, melena, rectal bleed.  GENITOURINARY: Denies dysuria, hematuria, renal colic.  ENDOCRINE: Denies polyuria, polydipsia, heat or cold intolerance.  HEMATOLOGY: Denies anemia, easy bruising, bleeding diathesis.  INTEGUMENTARY: Denies acne, rash or skin lesion.  MUSCULOSKELETAL: Denies any neck pain, cramps, gout.  NEUROLOGIC: Denies history of CVA, TIA, headache, tremors.  PSYCHIATRIC: Denies anxiety, insomnia, schizophrenia. Reports history of depression.   PHYSICAL EXAMINATION: VITAL SIGNS: Temperature 99.1, pulse 80, respiratory rate 20, blood pressure 116/68, saturating 92% on room air.  GENERAL: Elderly male in bed, in no apparent distress.  HEENT: Head atraumatic, normocephalic.  Right eye pupil equal and reactive to light. Pink conjunctivae. Anicteric sclerae. Has  left eye blindness with let eyelid drooping as well. Dry oral mucosa.  NECK: Supple. No thyromegaly. No JVD. No carotid bruits.  CHEST: [place] air entry bilaterally with bilateral rales and rhonchi. No wheezing.  CARDIOVASCULAR: S1, S2 heard. No rubs, murmurs, or gallops.  ABDOMEN: Soft, nontender, nondistended. Bowel sounds present.  EXTREMITIES: No edema. No clubbing. No cyanosis. Has slightly diminished pulses bilaterally but they were felt.  PSYCHIATRIC: Appropriate affect. Awake and alert x3. Intact judgment and insight.  NEUROLOGIC: Cranial nerves grossly intact. Motor 5 out of 5. No focal deficits.  SKIN: Dry, delayed skin turgor.  LYMPHATICS: No cervical lymphadenopathy.  MUSCULOSKELETAL: No joint effusion or erythema.   PERTINENT LABORATORY AND DIAGNOSTICS: Glucose 693. BUN 26, creatinine 2.31, sodium 121, potassium 6.6, chloride 88. Troponin 0.05. White blood cells 17, hemoglobin 13.2, hematocrit 40.6, platelets 217. Influenza negative.  Chest x-ray: Questionable infiltrate in the retrocardiac and right midlung.   ASSESSMENT AND PLAN: 1.  Pneumonia. The patient has evidence of pneumonia. Blood cultures were sent. The patient had evidence of pneumonia on the chest x-ray. Appears to be having right lung infiltrate and retrocardiac opacity. Will have him on Rocephin and Zithromax. We will follow on the blood cultures.  2.  Diabetes mellitus. Has uncontrolled hyperglycemia, but he is not in DKA. We will resume him back on insulin 70/30, but will have him on 20 units 2 times a day. Will add insulin sliding scale for him.  3.  Hyperkalemia. This is most likely due to his renal failure. The patient was given calcium gluconate, given Kayexalate, and subcutaneous insulin and IV fluids. We will recheck level in 3 hours.  4.  Hyponatremia. Appears to be pseudohyponatremia. Once corrected to his glucose level it will be 130. As well it is slightly on the lower side. This is most likely related to  dehydration. We will continue with intravenous normal saline.  5.  Chest pain. Appears to be due to dough, but he will be given 324 mg of aspirin. We will continue to cycle his cardiac enzymes. He is having abnormal EKG, but unsure what is his baseline.  6.  Acute renal failure. This is most likely related to dehydration. Will keep him on IV fluids.  7.  Deep vein thrombosis prophylaxis. Subcutaneous heparin.   CODE STATUS: The patient reports he is a FULL code.   TOTAL TIME SPENT ON ADMISSION AND PATIENT CARE: 55 minutes.  ____________________________ Albertine Patricia, MD dse:sb D: 10/10/2013 06:12:35 ET T: 10/10/2013 07:16:04 ET JOB#: [id number]  cc: Albertine Patricia, MD, <Dictator> Tinzlee Craker Graciela Husbands MD ELECTRONICALLY SIGNED 10/11/2013 1:13

## 2015-08-24 ENCOUNTER — Emergency Department: Payer: Medicare Other

## 2015-08-24 ENCOUNTER — Encounter: Payer: Self-pay | Admitting: Emergency Medicine

## 2015-08-24 ENCOUNTER — Emergency Department
Admission: EM | Admit: 2015-08-24 | Discharge: 2015-08-24 | Disposition: A | Discharge status: Home / Self Care | Payer: Medicare Other | Attending: Emergency Medicine | Admitting: Emergency Medicine

## 2015-08-24 DIAGNOSIS — Z79899 Other long term (current) drug therapy: Secondary | ICD-10-CM | POA: Diagnosis not present

## 2015-08-24 DIAGNOSIS — N183 Chronic kidney disease, stage 3 (moderate): Secondary | ICD-10-CM | POA: Insufficient documentation

## 2015-08-24 DIAGNOSIS — Z794 Long term (current) use of insulin: Secondary | ICD-10-CM | POA: Insufficient documentation

## 2015-08-24 DIAGNOSIS — E11621 Type 2 diabetes mellitus with foot ulcer: Secondary | ICD-10-CM | POA: Insufficient documentation

## 2015-08-24 DIAGNOSIS — Z7982 Long term (current) use of aspirin: Secondary | ICD-10-CM | POA: Insufficient documentation

## 2015-08-24 DIAGNOSIS — I129 Hypertensive chronic kidney disease with stage 1 through stage 4 chronic kidney disease, or unspecified chronic kidney disease: Secondary | ICD-10-CM | POA: Diagnosis not present

## 2015-08-24 DIAGNOSIS — Z87891 Personal history of nicotine dependence: Secondary | ICD-10-CM | POA: Diagnosis not present

## 2015-08-24 DIAGNOSIS — L089 Local infection of the skin and subcutaneous tissue, unspecified: Secondary | ICD-10-CM | POA: Diagnosis present

## 2015-08-24 DIAGNOSIS — L97511 Non-pressure chronic ulcer of other part of right foot limited to breakdown of skin: Secondary | ICD-10-CM | POA: Insufficient documentation

## 2015-08-24 DIAGNOSIS — L97519 Non-pressure chronic ulcer of other part of right foot with unspecified severity: Secondary | ICD-10-CM

## 2015-08-24 LAB — CBC WITH DIFFERENTIAL/PLATELET
BASOS ABS: 0.1 10*3/uL (ref 0–0.1)
BASOS PCT: 1 %
EOS ABS: 0.3 10*3/uL (ref 0–0.7)
Eosinophils Relative: 5 %
HCT: 35.7 % — ABNORMAL LOW (ref 40.0–52.0)
HEMOGLOBIN: 11.8 g/dL — AB (ref 13.0–18.0)
Lymphocytes Relative: 22 %
Lymphs Abs: 1.3 10*3/uL (ref 1.0–3.6)
MCH: 27.4 pg (ref 26.0–34.0)
MCHC: 33 g/dL (ref 32.0–36.0)
MCV: 82.9 fL (ref 80.0–100.0)
Monocytes Absolute: 0.5 10*3/uL (ref 0.2–1.0)
Monocytes Relative: 9 %
NEUTROS ABS: 3.6 10*3/uL (ref 1.4–6.5)
NEUTROS PCT: 63 %
Platelets: 162 10*3/uL (ref 150–440)
RBC: 4.3 MIL/uL — ABNORMAL LOW (ref 4.40–5.90)
RDW: 13.6 % (ref 11.5–14.5)
WBC: 5.7 10*3/uL (ref 3.8–10.6)

## 2015-08-24 LAB — COMPREHENSIVE METABOLIC PANEL
ALBUMIN: 3.5 g/dL (ref 3.5–5.0)
ALT: 22 U/L (ref 17–63)
AST: 26 U/L (ref 15–41)
Alkaline Phosphatase: 139 U/L — ABNORMAL HIGH (ref 38–126)
Anion gap: 5 (ref 5–15)
BILIRUBIN TOTAL: 0.5 mg/dL (ref 0.3–1.2)
BUN: 20 mg/dL (ref 6–20)
CO2: 27 mmol/L (ref 22–32)
Calcium: 8.7 mg/dL — ABNORMAL LOW (ref 8.9–10.3)
Chloride: 103 mmol/L (ref 101–111)
Creatinine, Ser: 2 mg/dL — ABNORMAL HIGH (ref 0.61–1.24)
GFR calc Af Amer: 39 mL/min — ABNORMAL LOW (ref >60)
GFR calc non Af Amer: 33 mL/min — ABNORMAL LOW (ref >60)
GLUCOSE: 306 mg/dL — AB (ref 65–99)
POTASSIUM: 4.6 mmol/L (ref 3.5–5.1)
SODIUM: 135 mmol/L (ref 135–145)
TOTAL PROTEIN: 7 g/dL (ref 6.5–8.1)

## 2015-08-24 MED ORDER — CEPHALEXIN 500 MG PO CAPS
500.0000 mg | ORAL_CAPSULE | Freq: Once | ORAL | Status: AC
  Administered 2015-08-24: 500 mg via ORAL
  Filled 2015-08-24: qty 1

## 2015-08-24 MED ORDER — SULFAMETHOXAZOLE-TRIMETHOPRIM 800-160 MG PO TABS
1.0000 | ORAL_TABLET | Freq: Once | ORAL | Status: AC
  Administered 2015-08-24: 1 via ORAL
  Filled 2015-08-24: qty 1

## 2015-08-24 MED ORDER — CEPHALEXIN 500 MG PO CAPS: 500.0000 mg | ORAL_CAPSULE | Freq: Two times a day (BID) | ORAL | Status: DC

## 2015-08-24 MED ORDER — SULFAMETHOXAZOLE-TRIMETHOPRIM 800-160 MG PO TABS
1.0000 | ORAL_TABLET | Freq: Two times a day (BID) | ORAL | Status: DC
Start: 2015-08-24 — End: 2016-09-08

## 2015-08-24 NOTE — ED Notes (Signed)
Pt states history of DM, was cutting toenails approx 5 days ago when he lacerated right toe. Pt with wound noted with sloughing skin to right great toe. Pt with small amount of purulent drainage noted to right medial great toenail. Pt denies pain to toe.

## 2015-08-24 NOTE — ED Notes (Signed)
Pt reports cutting toenails a week ago and cutting his toe. Pt reports being a diabetic. Pt in no acute distress

## 2015-08-24 NOTE — ED Provider Notes (Signed)
[facility] Emergency Department Provider Note   ____________________________________________  Time seen:  I have reviewed the triage vital signs and the triage nursing note.  HISTORY  Chief Complaint Wound Infection   Historian Patient  HPI Cameron Adams is a 64 y.o. male is here for evaluation of his right great toe wound. Patient does have a history of diabetes. When he was cutting his toenails about 5 days ago he lacerated his right toe. There was some swelling and some small drainage. The skin is peeling. No foot swelling or redness. No skin rash. No fever. No calf pain.    Past Medical History  Diagnosis Date  . Diabetes ([place])   . Thyroid dysfunction   . Syncope and collapse   . Hypertension   . Blindness of left eye   . Chronic kidney disease, stage III (moderate)     Patient Active Problem List   Diagnosis Date Noted  . Dyspnea 11/09/2013  . CAP (community acquired pneumonia) 10/31/2013  . Chest pain 10/31/2013  . Chronic respiratory failure with hypoxia ([place]) 10/31/2013    Past Surgical History  Procedure Laterality Date  . Thyroid surgery      Current Outpatient Rx  Name  Route  Sig  Dispense  Refill  . aspirin 81 MG tablet   Oral   Take 81 mg by mouth daily.         . cephALEXin (KEFLEX) 500 MG capsule   Oral   Take 1 capsule (500 mg total) by mouth 2 (two) times daily.   14 capsule   0   . citalopram (CELEXA) 20 MG tablet   Oral   Take 20 mg by mouth daily.         . ferrous sulfate 325 (65 FE) MG tablet   Oral   Take 325 mg by mouth daily with breakfast.         . insulin aspart protamine- aspart (NOVOLOG MIX 70/30) (70-30) 100 UNIT/ML injection   Subcutaneous   Inject into the skin 2 (two) times daily with a meal. 40 units a.m., 25 units p.m.         Marland Kitchen levothyroxine (SYNTHROID, LEVOTHROID) 175 MCG tablet   Oral   Take 175 mcg by mouth daily before breakfast.         . LORazepam (ATIVAN) 0.5 MG  tablet   Oral   Take 0.5 mg by mouth as needed for anxiety.         . psyllium (METAMUCIL) 58.6 % packet   Oral   Take 1 packet by mouth daily.         Marland Kitchen sulfamethoxazole-trimethoprim (BACTRIM DS,SEPTRA DS) 800-160 MG tablet   Oral   Take 1 tablet by mouth 2 (two) times daily.   20 tablet   0     Allergies Review of patient's allergies indicates no known allergies.  Family History  Problem Relation Age of Onset  . Heart disease Brother   . Kidney failure Mother   . Heart disease Father   . Kidney failure Father     Social History Social History  Substance Use Topics  . Smoking status: Former Smoker -- 0.10 packs/day for 25 years    Types: Cigarettes    Quit date: 10/31/1978  . Smokeless tobacco: Never Used     Comment: smoked 2-3 cigarettes/day  . Alcohol Use: No    Review of Systems  Constitutional: Negative for fever. Eyes: Negative for visual changes. ENT: Negative for  sore throat. Cardiovascular: Negative for chest pain. Respiratory: Negative for shortness of breath. Gastrointestinal: Negative for abdominal pain, vomiting and diarrhea. Genitourinary: Negative for dysuria. Musculoskeletal: Negative for back pain. Skin: Negative for rash. Neurological: Negative for headache. 10 point Review of Systems otherwise negative ____________________________________________   PHYSICAL EXAM:  VITAL SIGNS: ED Triage Vitals  Enc Vitals Group     BP 08/24/15 2019 98/51 mmHg     Pulse Rate 08/24/15 2019 72     Resp 08/24/15 2019 14     Temp 08/24/15 2019 98.3 F (36.8 C)     Temp Source 08/24/15 2019 Oral     SpO2 08/24/15 2019 100 %     Weight 08/24/15 2019 170 lb (77.111 kg)     Height 08/24/15 2019 5\' 11"  (1.803 m)     Head Cir --      Peak Flow --      Pain Score --      Pain Loc --      Pain Edu? --      Excl. in [place]? --      Constitutional: Alert and oriented. Well appearing and in no distress. Eyes: Conjunctivae are normal. PERRL. Normal  extraocular movements. ENT   Head: Normocephalic and atraumatic.   Nose: No congestion/rhinnorhea.   Mouth/Throat: Mucous membranes are moist.   Neck: No stridor. Cardiovascular/Chest: Normal rate, regular rhythm.  No murmurs, rubs, or gallops. Respiratory: Normal respiratory effort without tachypnea nor retractions. Breath sounds are clear and equal bilaterally. No wheezes/rales/rhonchi. Gastrointestinal: Soft. No distention, no guarding, no rebound. Nontender   Genitourinary/rectal:Deferred Musculoskeletal: Nontender with normal range of motion in all extremities. No joint effusions.  No lower extremity tenderness.  No edema.  Right great toe has peeling skin and some necrotic skin with some erythema. No purulent drainage noted. No fluctuance or abscess. No swelling of the foot or redness to the foot. Neurologic:  Normal speech and language. No gross or focal neurologic deficits are appreciated. Skin:  Skin is warm, dry and intact. No rash noted. Psychiatric: Mood and affect are normal. Speech and behavior are normal. Patient exhibits appropriate insight and judgment.  ____________________________________________   EKG I, Lisa [place], MD, the attending physician have personally viewed and interpreted all ECGs.  No EKG performed ____________________________________________  LABS (pertinent positives/negatives)  CBC within normal limits except hemoglobin 11.8 Comprehensive metabolic panel without significant abnormality.  ____________________________________________  RADIOLOGY All Xrays were viewed by me. Imaging interpreted by Radiologist.  Foot right complete:  IMPRESSION: Soft tissue deformity about the great toe. No evidence for underlying the osseous destruction. __________________________________________  PROCEDURES  Procedure(s) performed: None  Critical Care performed: None  ____________________________________________   ED COURSE / ASSESSMENT  AND PLAN  CONSULTATIONS: None  Pertinent labs & imaging results that were available during my care of the patient were reviewed by me and considered in my medical decision making (see chart for details).   Patient states diabetic foot wound. There is still some redness that makes me concerned for underlying infection, localized to the toe. Patient will be placed on Keflex and Bactrim. He will be referred to the [facility].  Patient / Family / Caregiver informed of clinical course, medical decision-making process, and agree with plan.   I discussed return precautions, follow-up instructions, and discharged instructions with patient and/or family.  ___________________________________________   FINAL CLINICAL IMPRESSION(S) / ED DIAGNOSES   Final diagnoses:  Diabetic ulcer of right foot associated with type 2 diabetes mellitus ([place])  Lisa [place], MD 08/24/15 [phone]

## 2015-08-24 NOTE — Discharge Instructions (Signed)
You're being treated for diabetic foot wound. You may soak up to 3 times per day in Epsom salt, available over-the-counter.  You need to follow up with the [facility], you are referred to the [place] wound center.  Return to the emergency department for any worsening condition including pain, redness, swelling, fever, or any other symptoms concerning to you.   Diabetes and Foot Care Diabetes may cause you to have problems because of poor blood supply (circulation) to your feet and legs. This may cause the skin on your feet to become thinner, break easier, and heal more slowly. Your skin may become dry, and the skin may peel and crack. You may also have nerve damage in your legs and feet causing decreased feeling in them. You may not notice minor injuries to your feet that could lead to infections or more serious problems. Taking care of your feet is one of the most important things you can do for yourself.  HOME CARE INSTRUCTIONS  Wear shoes at all times, even in the house. Do not go barefoot. Bare feet are easily injured.  Check your feet daily for blisters, cuts, and redness. If you cannot see the bottom of your feet, use a mirror or ask someone for help.  Wash your feet with warm water (do not use hot water) and mild soap. Then pat your feet and the areas between your toes until they are completely dry. Do not soak your feet as this can dry your skin.  Apply a moisturizing lotion or petroleum jelly (that does not contain alcohol and is unscented) to the skin on your feet and to dry, brittle toenails. Do not apply lotion between your toes.  Trim your toenails straight across. Do not dig under them or around the cuticle. File the edges of your nails with an emery board or nail file.  Do not cut corns or calluses or try to remove them with medicine.  Wear clean socks or stockings every day. Make sure they are not too tight. Do not wear knee-high stockings since they may decrease blood  flow to your legs.  Wear shoes that fit properly and have enough cushioning. To break in new shoes, wear them for just a few hours a day. This prevents you from injuring your feet. Always look in your shoes before you put them on to be sure there are no objects inside.  Do not cross your legs. This may decrease the blood flow to your feet.  If you find a minor scrape, cut, or break in the skin on your feet, keep it and the skin around it clean and dry. These areas may be cleansed with mild soap and water. Do not cleanse the area with peroxide, alcohol, or iodine.  When you remove an adhesive bandage, be sure not to damage the skin around it.  If you have a wound, look at it several times a day to make sure it is healing.  Do not use heating pads or hot water bottles. They may burn your skin. If you have lost feeling in your feet or legs, you may not know it is happening until it is too late.  Make sure your health care provider performs a complete foot exam at least annually or more often if you have foot problems. Report any cuts, sores, or bruises to your health care provider immediately. SEEK MEDICAL CARE IF:   You have an injury that is not healing.  You have cuts or breaks in  the skin.  You have an ingrown nail.  You notice redness on your legs or feet.  You feel burning or tingling in your legs or feet.  You have pain or cramps in your legs and feet.  Your legs or feet are numb.  Your feet always feel cold. SEEK IMMEDIATE MEDICAL CARE IF:   There is increasing redness, swelling, or pain in or around a wound.  There is a red line that goes up your leg.  Pus is coming from a wound.  You develop a fever or as directed by your health care provider.  You notice a bad smell coming from an ulcer or wound.   This information is not intended to replace advice given to you by your health care provider. Make sure you discuss any questions you have with your health care  provider.   Document Released: 09/19/2000 Document Revised: 05/25/2013 Document Reviewed: 03/01/2013 Elsevier Interactive Patient Education [employer].

## 2015-08-24 NOTE — ED Notes (Signed)
One set of blood cultures obtained and sent to lab 

## 2015-10-19 ENCOUNTER — Encounter: Payer: Self-pay | Admitting: Emergency Medicine

## 2015-10-19 ENCOUNTER — Emergency Department
Admission: EM | Admit: 2015-10-19 | Discharge: 2015-10-20 | Disposition: A | Discharge status: Home / Self Care | Payer: Medicare Other | Attending: Emergency Medicine | Admitting: Emergency Medicine

## 2015-10-19 DIAGNOSIS — R110 Nausea: Secondary | ICD-10-CM

## 2015-10-19 DIAGNOSIS — Z794 Long term (current) use of insulin: Secondary | ICD-10-CM | POA: Insufficient documentation

## 2015-10-19 DIAGNOSIS — N183 Chronic kidney disease, stage 3 (moderate): Secondary | ICD-10-CM | POA: Diagnosis not present

## 2015-10-19 DIAGNOSIS — Z7982 Long term (current) use of aspirin: Secondary | ICD-10-CM | POA: Insufficient documentation

## 2015-10-19 DIAGNOSIS — E1165 Type 2 diabetes mellitus with hyperglycemia: Secondary | ICD-10-CM | POA: Insufficient documentation

## 2015-10-19 DIAGNOSIS — I129 Hypertensive chronic kidney disease with stage 1 through stage 4 chronic kidney disease, or unspecified chronic kidney disease: Secondary | ICD-10-CM | POA: Insufficient documentation

## 2015-10-19 DIAGNOSIS — R42 Dizziness and giddiness: Secondary | ICD-10-CM

## 2015-10-19 DIAGNOSIS — Z87891 Personal history of nicotine dependence: Secondary | ICD-10-CM | POA: Insufficient documentation

## 2015-10-19 DIAGNOSIS — Z79899 Other long term (current) drug therapy: Secondary | ICD-10-CM | POA: Insufficient documentation

## 2015-10-19 DIAGNOSIS — R200 Anesthesia of skin: Secondary | ICD-10-CM | POA: Insufficient documentation

## 2015-10-19 DIAGNOSIS — R739 Hyperglycemia, unspecified: Secondary | ICD-10-CM

## 2015-10-19 LAB — URINALYSIS COMPLETE WITH MICROSCOPIC (ARMC ONLY)
BILIRUBIN URINE: NEGATIVE
Bacteria, UA: NONE SEEN
Hgb urine dipstick: NEGATIVE
KETONES UR: NEGATIVE mg/dL
LEUKOCYTES UA: NEGATIVE
NITRITE: NEGATIVE
Protein, ur: NEGATIVE mg/dL
RBC / HPF: NONE SEEN RBC/hpf (ref 0–5)
SPECIFIC GRAVITY, URINE: 1.015 (ref 1.005–1.030)
pH: 5 (ref 5.0–8.0)

## 2015-10-19 LAB — BASIC METABOLIC PANEL
Anion gap: 6 (ref 5–15)
BUN: 23 mg/dL — AB (ref 6–20)
CALCIUM: 9 mg/dL (ref 8.9–10.3)
CO2: 28 mmol/L (ref 22–32)
CREATININE: 2.04 mg/dL — AB (ref 0.61–1.24)
Chloride: 99 mmol/L — ABNORMAL LOW (ref 101–111)
GFR calc Af Amer: 38 mL/min — ABNORMAL LOW (ref >60)
GFR, EST NON AFRICAN AMERICAN: 33 mL/min — AB (ref >60)
Glucose, Bld: 319 mg/dL — ABNORMAL HIGH (ref 65–99)
POTASSIUM: 4.6 mmol/L (ref 3.5–5.1)
SODIUM: 133 mmol/L — AB (ref 135–145)

## 2015-10-19 LAB — CBC
HEMATOCRIT: 37.2 % — AB (ref 40.0–52.0)
Hemoglobin: 12.4 g/dL — ABNORMAL LOW (ref 13.0–18.0)
MCH: 27.6 pg (ref 26.0–34.0)
MCHC: 33.3 g/dL (ref 32.0–36.0)
MCV: 82.9 fL (ref 80.0–100.0)
PLATELETS: 170 10*3/uL (ref 150–440)
RBC: 4.49 MIL/uL (ref 4.40–5.90)
RDW: 13.5 % (ref 11.5–14.5)
WBC: 5.5 10*3/uL (ref 3.8–10.6)

## 2015-10-19 LAB — GLUCOSE, CAPILLARY
GLUCOSE-CAPILLARY: 270 mg/dL — AB (ref 65–99)
GLUCOSE-CAPILLARY: 269 mg/dL — AB (ref 65–99)
Glucose-Capillary: 353 mg/dL — ABNORMAL HIGH (ref 65–99)

## 2015-10-19 LAB — TROPONIN I
TROPONIN I: 0.05 ng/mL — AB (ref <0.031)
Troponin I: 0.04 ng/mL — ABNORMAL HIGH (ref <0.031)

## 2015-10-19 NOTE — Discharge Instructions (Signed)
Hyperglycemia °Hyperglycemia occurs when the glucose (sugar) in your blood is too high. Hyperglycemia can happen for many reasons, but it most often happens to people who do not know they have diabetes or are not managing their diabetes properly.  °CAUSES  °Whether you have diabetes or not, there are other causes of hyperglycemia. Hyperglycemia can occur when you have diabetes, but it can also occur in other situations that you might not be as aware of, such as: °Diabetes °· If you have diabetes and are having problems controlling your blood glucose, hyperglycemia could occur because of some of the following reasons: °· Not following your meal plan. °· Not taking your diabetes medications or not taking it properly. °· Exercising less or doing less activity than you normally do. °· Being sick. °Pre-diabetes °· This cannot be ignored. Before people develop Type 2 diabetes, they almost always have "pre-diabetes." This is when your blood glucose levels are higher than normal, but not yet high enough to be diagnosed as diabetes. Research has shown that some long-term damage to the body, especially the heart and circulatory system, may already be occurring during pre-diabetes. If you take action to manage your blood glucose when you have pre-diabetes, you may delay or prevent Type 2 diabetes from developing. °Stress °· If you have diabetes, you may be "diet" controlled or on oral medications or insulin to control your diabetes. However, you may find that your blood glucose is higher than usual in the hospital whether you have diabetes or not. This is often referred to as "stress hyperglycemia." Stress can elevate your blood glucose. This happens because of hormones put out by the body during times of stress. If stress has been the cause of your high blood glucose, it can be followed regularly by your caregiver. That way he/she can make sure your hyperglycemia does not continue to get worse or progress to  diabetes. °Steroids °· Steroids are medications that act on the infection fighting system (immune system) to block inflammation or infection. One side effect can be a rise in blood glucose. Most people can produce enough extra insulin to allow for this rise, but for those who cannot, steroids make blood glucose levels go even higher. It is not unusual for steroid treatments to "uncover" diabetes that is developing. It is not always possible to determine if the hyperglycemia will go away after the steroids are stopped. A special blood test called an A1c is sometimes done to determine if your blood glucose was elevated before the steroids were started. °SYMPTOMS °· Thirsty. °· Frequent urination. °· Dry mouth. °· Blurred vision. °· Tired or fatigue. °· Weakness. °· Sleepy. °· Tingling in feet or leg. °DIAGNOSIS  °Diagnosis is made by monitoring blood glucose in one or all of the following ways: °· A1c test. This is a chemical found in your blood. °· Fingerstick blood glucose monitoring. °· Laboratory results. °TREATMENT  °First, knowing the cause of the hyperglycemia is important before the hyperglycemia can be treated. Treatment may include, but is not be limited to: °· Education. °· Change or adjustment in medications. °· Change or adjustment in meal plan. °· Treatment for an illness, infection, etc. °· More frequent blood glucose monitoring. °· Change in exercise plan. °· Decreasing or stopping steroids. °· Lifestyle changes. °HOME CARE INSTRUCTIONS  °· Test your blood glucose as directed. °· Exercise regularly. Your caregiver will give you instructions about exercise. Pre-diabetes or diabetes which comes on with stress is helped by exercising. °· Eat wholesome,   balanced meals. Eat often and at regular, fixed times. Your caregiver or nutritionist will give you a meal plan to guide your sugar intake.  Being at an ideal weight is important. If needed, losing as little as 10 to 15 pounds may help improve blood  glucose levels. SEEK MEDICAL CARE IF:   You have questions about medicine, activity, or diet.  You continue to have symptoms (problems such as increased thirst, urination, or weight gain). SEEK IMMEDIATE MEDICAL CARE IF:   You are vomiting or have diarrhea.  Your breath smells fruity.  You are breathing faster or slower.  You are very sleepy or incoherent.  You have numbness, tingling, or pain in your feet or hands.  You have chest pain.  Your symptoms get worse even though you have been following your caregiver's orders.  If you have any other questions or concerns.   This information is not intended to replace advice given to you by your health care provider. Make sure you discuss any questions you have with your health care provider.   Document Released: 03/18/2001 Document Revised: 12/15/2011 Document Reviewed: 05/29/2015 Elsevier Interactive Patient Education 2016 Elsevier Inc.  Nausea, Adult Nausea means you feel sick to your stomach or need to throw up (vomit). It may be a sign of a more serious problem. If nausea gets worse, you may throw up. If you throw up a lot, you may lose too much body fluid (dehydration). HOME CARE   Get plenty of rest.  Ask your doctor how to replace body fluid losses (rehydrate).  Eat small amounts of food. Sip liquids more often.  Take all medicines as told by your doctor. GET HELP RIGHT AWAY IF:  You have a fever.  You pass out (faint).  You keep throwing up or have blood in your throw up.  You are very weak, have dry lips or a dry mouth, or you are very thirsty (dehydrated).  You have dark or bloody poop (stool).  You have very bad chest or belly (abdominal) pain.  You do not get better after 2 days, or you get worse.  You have a headache. MAKE SURE YOU:  Understand these instructions.  Will watch your condition.  Will get help right away if you are not doing well or get worse.   This information is not intended to  replace advice given to you by your health care provider. Make sure you discuss any questions you have with your health care provider.   Document Released: 09/11/2011 Document Revised: 12/15/2011 Document Reviewed: 09/11/2011 Elsevier Interactive Patient Education [employer].

## 2015-10-19 NOTE — ED Provider Notes (Addendum)
[facility] Emergency Department Provider Note  ____________________________________________  Time seen: Approximately 850 PM  I have reviewed the triage vital signs and the nursing notes.   HISTORY  Chief Complaint Blood Sugar Problem    HPI Cameron Adams is a 65 y.o. male with a history of legal blindness, diabetes and hypertension who is presenting today with feelings of lightheadedness as well as nausea. He says that he feels this way when his blood sugar is elevated. He says that his blood sugar is normally in the 180s to 200s. He denies any noncompliance with his blood pressure medications and says that he has not eaten any particular sugary foods and says he has been eating fruits and vegetables. He said that he thinks he did overexert himself today by "doing the laundry and then walking a lot." He said that the last time he overexerted himself by doing his own laundry and then walking he had numbness in his left arm. He denies any pain or shortness of breath.     Past Medical History  Diagnosis Date  . Diabetes ([place])   . Thyroid dysfunction   . Syncope and collapse   . Hypertension   . Blindness of left eye   . Chronic kidney disease, stage III (moderate)     Patient Active Problem List   Diagnosis Date Noted  . Dyspnea 11/09/2013  . CAP (community acquired pneumonia) 10/31/2013  . Chest pain 10/31/2013  . Chronic respiratory failure with hypoxia ([place]) 10/31/2013    Past Surgical History  Procedure Laterality Date  . Thyroid surgery      Current Outpatient Rx  Name  Route  Sig  Dispense  Refill  . levothyroxine (SYNTHROID, LEVOTHROID) 150 MCG tablet   Oral   Take 150 mcg by mouth daily before breakfast.         . aspirin 81 MG tablet   Oral   Take 81 mg by mouth daily.         . cephALEXin (KEFLEX) 500 MG capsule   Oral   Take 1 capsule (500 mg total) by mouth 2 (two) times daily. Patient not taking: Reported on 10/19/2015    14 capsule   0   . citalopram (CELEXA) 20 MG tablet   Oral   Take 20 mg by mouth daily.         . ferrous sulfate 325 (65 FE) MG tablet   Oral   Take 325 mg by mouth daily with breakfast.         . insulin aspart protamine- aspart (NOVOLOG MIX 70/30) (70-30) 100 UNIT/ML injection   Subcutaneous   Inject into the skin 2 (two) times daily with a meal. 40 units a.m., 25 units p.m.         Marland Kitchen LEVEMIR 100 UNIT/ML injection   Subcutaneous   Inject 12 Units into the skin at bedtime.      4     Dispense as written.   Marland Kitchen LORazepam (ATIVAN) 0.5 MG tablet   Oral   Take 0.5 mg by mouth as needed for anxiety.         . psyllium (METAMUCIL) 58.6 % packet   Oral   Take 1 packet by mouth daily.         Marland Kitchen sulfamethoxazole-trimethoprim (BACTRIM DS,SEPTRA DS) 800-160 MG tablet   Oral   Take 1 tablet by mouth 2 (two) times daily. Patient not taking: Reported on 10/19/2015   20 tablet   0  Allergies Review of patient's allergies indicates no known allergies.  Family History  Problem Relation Age of Onset  . Heart disease Brother   . Kidney failure Mother   . Heart disease Father   . Kidney failure Father     Social History Social History  Substance Use Topics  . Smoking status: Former Smoker -- 0.10 packs/day for 25 years    Types: Cigarettes    Quit date: 10/31/1978  . Smokeless tobacco: Never Used     Comment: smoked 2-3 cigarettes/day  . Alcohol Use: No    Review of Systems Constitutional: No fever/chills Eyes: No visual changes. ENT: No sore throat. Cardiovascular: Denies chest pain. Respiratory: Denies shortness of breath. Gastrointestinal: No abdominal pain.  no vomiting.  No diarrhea.  No constipation. Genitourinary: Negative for dysuria. Musculoskeletal: Negative for back pain. Skin: Negative for rash. Neurological: Negative for headaches, focal weakness or numbness.  10-point ROS otherwise  negative.  ____________________________________________   PHYSICAL EXAM:  VITAL SIGNS: ED Triage Vitals  Enc Vitals Group     BP 10/19/15 1551 94/51 mmHg     Pulse Rate 10/19/15 1551 69     Resp 10/19/15 1551 16     Temp 10/19/15 1551 97.9 F (36.6 C)     Temp Source 10/19/15 1551 Oral     SpO2 10/19/15 1551 100 %     Weight 10/19/15 1551 173 lb (78.472 kg)     Height 10/19/15 1551 5\' 11"  (1.803 m)     Head Cir --      Peak Flow --      Pain Score --      Pain Loc --      Pain Edu? --      Excl. in [place]? --     Constitutional: Alert and oriented. Well appearing and in no acute distress. Eyes: Conjunctivae are normal. PERRL. EOMI. Head: Atraumatic. Nose: No congestion/rhinnorhea. Mouth/Throat: Mucous membranes are moist.  Neck: No stridor.   Cardiovascular: Normal rate, regular rhythm. Grossly normal heart sounds.  Good peripheral circulation. Respiratory: Normal respiratory effort.  No retractions. Lungs CTAB. Gastrointestinal: Soft and nontender. No distention. No abdominal bruits. No CVA tenderness. Musculoskeletal: No lower extremity tenderness nor edema.  No joint effusions. Neurologic:  Normal speech and language. No gross focal neurologic deficits are appreciated. No gait instability. Skin:  Skin is warm, dry and intact. No rash noted. Psychiatric: Mood and affect are normal. Speech and behavior are normal.  ____________________________________________   LABS (all labs ordered are listed, but only abnormal results are displayed)  Labs Reviewed  GLUCOSE, CAPILLARY - Abnormal; Notable for the following:    Glucose-Capillary 270 (*)    All other components within normal limits  BASIC METABOLIC PANEL - Abnormal; Notable for the following:    Sodium 133 (*)    Chloride 99 (*)    Glucose, Bld 319 (*)    BUN 23 (*)    Creatinine, Ser 2.04 (*)    GFR calc non Af Amer 33 (*)    GFR calc Af Amer 38 (*)    All other components within normal limits  CBC - Abnormal;  Notable for the following:    Hemoglobin 12.4 (*)    HCT 37.2 (*)    All other components within normal limits  URINALYSIS COMPLETEWITH MICROSCOPIC ([facility] ONLY) - Abnormal; Notable for the following:    Color, Urine YELLOW (*)    APPearance CLEAR (*)    Glucose, UA >500 (*)  Squamous Epithelial / LPF 0-5 (*)    All other components within normal limits  GLUCOSE, CAPILLARY - Abnormal; Notable for the following:    Glucose-Capillary 353 (*)    All other components within normal limits  TROPONIN I - Abnormal; Notable for the following:    Troponin I 0.04 (*)    All other components within normal limits  TROPONIN I - Abnormal; Notable for the following:    Troponin I 0.05 (*)    All other components within normal limits  GLUCOSE, CAPILLARY - Abnormal; Notable for the following:    Glucose-Capillary 269 (*)    All other components within normal limits  CBG MONITORING, ED  CBG MONITORING, ED   ____________________________________________  EKG  ED ECG REPORT I, Jamarius Saha,  Youlanda Roys, the attending physician, personally viewed and interpreted this ECG.   Date: 10/19/2015  EKG Time: 2124  Rate: 66  Rhythm: normal sinus rhythm  Axis: Left axis deviation  Intervals:none  ST&T Change: No ST segment elevation or depression. No abnormal T-wave inversion.  ____________________________________________  RADIOLOGY   ____________________________________________   PROCEDURES   ____________________________________________   INITIAL IMPRESSION / ASSESSMENT AND PLAN / ED COURSE  Pertinent labs & imaging results that were available during my care of the patient were reviewed by me and considered in my medical decision making (see chart for details).  ----------------------------------------- 11:53 PM on 10/19/2015 -----------------------------------------  Patient feeling back to baseline now without any complaints. Says that he is ready to go home. I did discuss with him the  slightly increased troponin. He understands that this could be related to heart disease and that this could result in death or permanent disability of his condition worsens. However, he thinks he just overexerted himself and would like to be discharged home.  Patient is well-appearing and has capacity to make his medical decisions. He has insight into his current medical condition as well as repercussions of refusing further medical care. He'll be discharged home. ____________________________________________   FINAL CLINICAL IMPRESSION(S) / ED DIAGNOSES  Hyperglycemia. Nausea.lightheadedness     Orbie Pyo, MD 10/19/15 2355  The patient understands that he may return to the hospital at any time for any worsening or concerning symptoms.  Orbie Pyo, MD 10/19/15 [phone]

## 2015-10-19 NOTE — ED Notes (Signed)
Patient states that he was feeling light headed earlier and also felt nauseated. Patient went to the pharmacy to have BP checked and it was 114/65 so patient came here to have his blood sugar checked. Patient states that he does not have meter at home because he is legally blind and he is unable to check his blood sugar.   Patient states that the nausea and lightheadedness if now better.

## 2015-10-19 NOTE — ED Notes (Signed)
Pt present with c/o light headed and wants to have his blood sugar checked.

## 2016-03-23 DIAGNOSIS — R739 Hyperglycemia, unspecified: Secondary | ICD-10-CM | POA: Insufficient documentation

## 2016-03-26 DIAGNOSIS — D472 Monoclonal gammopathy: Secondary | ICD-10-CM | POA: Insufficient documentation

## 2016-03-26 DIAGNOSIS — L299 Pruritus, unspecified: Secondary | ICD-10-CM | POA: Insufficient documentation

## 2016-03-26 DIAGNOSIS — I951 Orthostatic hypotension: Secondary | ICD-10-CM | POA: Insufficient documentation

## 2016-03-26 DIAGNOSIS — F1010 Alcohol abuse, uncomplicated: Secondary | ICD-10-CM | POA: Insufficient documentation

## 2016-09-08 ENCOUNTER — Emergency Department
Admission: EM | Admit: 2016-09-08 | Discharge: 2016-09-08 | Disposition: A | Discharge status: Home / Self Care | Payer: Medicare HMO | Attending: Emergency Medicine | Admitting: Emergency Medicine

## 2016-09-08 ENCOUNTER — Encounter: Payer: Self-pay | Admitting: Medical Oncology

## 2016-09-08 DIAGNOSIS — N183 Chronic kidney disease, stage 3 (moderate): Secondary | ICD-10-CM | POA: Insufficient documentation

## 2016-09-08 DIAGNOSIS — Y999 Unspecified external cause status: Secondary | ICD-10-CM | POA: Diagnosis not present

## 2016-09-08 DIAGNOSIS — Z79899 Other long term (current) drug therapy: Secondary | ICD-10-CM | POA: Insufficient documentation

## 2016-09-08 DIAGNOSIS — Z794 Long term (current) use of insulin: Secondary | ICD-10-CM | POA: Insufficient documentation

## 2016-09-08 DIAGNOSIS — Z7982 Long term (current) use of aspirin: Secondary | ICD-10-CM | POA: Diagnosis not present

## 2016-09-08 DIAGNOSIS — E1122 Type 2 diabetes mellitus with diabetic chronic kidney disease: Secondary | ICD-10-CM | POA: Diagnosis not present

## 2016-09-08 DIAGNOSIS — I129 Hypertensive chronic kidney disease with stage 1 through stage 4 chronic kidney disease, or unspecified chronic kidney disease: Secondary | ICD-10-CM | POA: Insufficient documentation

## 2016-09-08 DIAGNOSIS — S0101XA Laceration without foreign body of scalp, initial encounter: Secondary | ICD-10-CM | POA: Diagnosis present

## 2016-09-08 DIAGNOSIS — Z87891 Personal history of nicotine dependence: Secondary | ICD-10-CM | POA: Insufficient documentation

## 2016-09-08 DIAGNOSIS — Y929 Unspecified place or not applicable: Secondary | ICD-10-CM | POA: Diagnosis not present

## 2016-09-08 DIAGNOSIS — Y9389 Activity, other specified: Secondary | ICD-10-CM | POA: Insufficient documentation

## 2016-09-08 DIAGNOSIS — W269XXA Contact with unspecified sharp object(s), initial encounter: Secondary | ICD-10-CM | POA: Insufficient documentation

## 2016-09-08 MED ORDER — TRAMADOL HCL 50 MG PO TABS: 50.0000 mg | ORAL_TABLET | Freq: Four times a day (QID) | ORAL | 0 refills | Status: DC | PRN

## 2016-09-08 NOTE — ED Notes (Signed)
Pt verbalizes understanding about BP and follow up. Pt denies any headache or new onset blurred vision.

## 2016-09-08 NOTE — ED Provider Notes (Signed)
[facility] Emergency Department Provider Note  ____________________________________________   First MD Initiated Contact with Patient 09/08/16 1233     (approximate)  I have reviewed the triage vital signs and the nursing notes.   HISTORY  Chief Complaint Laceration   HPI Cameron Adams is a 65 y.o. male is here with a skin avulsion after cutting his scalp yesterday while shaving it. Patient denies being on any blood thinners. He states that at this present time he believes that there is no active bleeding. Patient states that he has had a tetanus shot within last 5 years. Patient states that the area is tender to touch. Patient states that he was in rehabilitation until July of this year and has not seen his primary care doctor since that time. He states he has a history of what he believes to be low blood pressure and that he is taking medication for it. He is unaware of the name of his medication. He continues to take his regular medication as prescribed by his primary care doctor. Currently he denies any headache, dizziness, shortness of breath, chest pain, vision changes.   Past Medical History:  Diagnosis Date  . Blindness of left eye   . Chronic kidney disease, stage III (moderate)   . Diabetes ([place])   . Hypertension   . Syncope and collapse   . Thyroid dysfunction     Patient Active Problem List   Diagnosis Date Noted  . Dyspnea 11/09/2013  . CAP (community acquired pneumonia) 10/31/2013  . Chest pain 10/31/2013  . Chronic respiratory failure with hypoxia ([place]) 10/31/2013    Past Surgical History:  Procedure Laterality Date  . THYROID SURGERY      Prior to Admission medications   Medication Sig Start Date End Date Taking? Authorizing Provider  aspirin 81 MG tablet Take 81 mg by mouth daily.    Historical Provider, MD  cephALEXin (KEFLEX) 500 MG capsule Take 1 capsule (500 mg total) by mouth 2 (two) times daily. Patient not taking:  Reported on 10/19/2015 08/24/15   Lisa Roca, MD  citalopram (CELEXA) 20 MG tablet Take 20 mg by mouth daily.    Historical Provider, MD  ferrous sulfate 325 (65 FE) MG tablet Take 325 mg by mouth daily with breakfast.    Historical Provider, MD  insulin aspart protamine- aspart (NOVOLOG MIX 70/30) (70-30) 100 UNIT/ML injection Inject into the skin 2 (two) times daily with a meal. 40 units a.m., 25 units p.m.    Historical Provider, MD  LEVEMIR 100 UNIT/ML injection Inject 12 Units into the skin at bedtime. 10/08/15   Historical Provider, MD  levothyroxine (SYNTHROID, LEVOTHROID) 150 MCG tablet Take 150 mcg by mouth daily before breakfast.    Historical Provider, MD  LORazepam (ATIVAN) 0.5 MG tablet Take 0.5 mg by mouth as needed for anxiety.    Historical Provider, MD  psyllium (METAMUCIL) 58.6 % packet Take 1 packet by mouth daily.    Historical Provider, MD  sulfamethoxazole-trimethoprim (BACTRIM DS,SEPTRA DS) 800-160 MG tablet Take 1 tablet by mouth 2 (two) times daily. Patient not taking: Reported on 10/19/2015 08/24/15   Lisa Roca, MD    Allergies Patient has no known allergies.  Family History  Problem Relation Age of Onset  . Kidney failure Mother   . Heart disease Father   . Kidney failure Father   . Heart disease Brother     Social History Social History  Substance Use Topics  . Smoking status: Former Smoker  Packs/day: 0.10    Years: 25.00    Types: Cigarettes    Quit date: 10/31/1978  . Smokeless tobacco: Never Used     Comment: smoked 2-3 cigarettes/day  . Alcohol use No    Review of Systems Constitutional: No fever/chills Eyes: No visual changes. ENT: No sore throat. Cardiovascular: Denies chest pain. Respiratory: Denies shortness of breath. Gastrointestinal: No abdominal pain.  No nausea, no vomiting.  No diarrhea.   Genitourinary: Negative for dysuria. Musculoskeletal: Negative for back pain. Skin: Positive for superficial skin avulsion  scalp. Neurological: Negative for headaches, focal weakness or numbness.  10-point ROS otherwise negative.  ____________________________________________   PHYSICAL EXAM:  VITAL SIGNS: ED Triage Vitals  Enc Vitals Group     BP 09/08/16 1052 (!) 141/71     Pulse Rate 09/08/16 1052 77     Resp 09/08/16 1052 18     Temp 09/08/16 1052 97.8 F (36.6 C)     Temp Source 09/08/16 1052 Oral     SpO2 09/08/16 1052 95 %     Weight 09/08/16 1037 173 lb (78.5 kg)     Height --      Head Circumference --      Peak Flow --      Pain Score 09/08/16 1037 8     Pain Loc --      Pain Edu? --      Excl. in [place]? --     Constitutional: Alert and oriented. Well appearing and in no acute distress. Eyes: Conjunctivae are normal. PERRL. EOMI. Head: Atraumatic. Nose: No congestion/rhinnorhea. Neck: No stridor.   Cardiovascular: Normal rate, regular rhythm. Grossly normal heart sounds.  Good peripheral circulation. Respiratory: Normal respiratory effort.  No retractions. Lungs CTAB. Gastrointestinal: Soft and nontender. No distention.  Musculoskeletal: Moves upper and lower extremities without any difficulty. Normal gait was noted. Neurologic:  Normal speech and language. No gross focal neurologic deficits are appreciated. No gait instability. Skin:  Skin is warm, dry.  There is very superficial 1-1-1/2 cm area superior scalp area without any active bleeding. Patient has a dry dressing placed which has adhered to the area. There is small amount of bleeding after removing this dressing. Psychiatric: Mood and affect are normal. Speech and behavior are normal.  ____________________________________________   LABS (all labs ordered are listed, but only abnormal results are displayed)  Labs Reviewed - No data to display   PROCEDURES  Procedure(s) performed: None  Procedures  Critical Care performed: No  ____________________________________________   INITIAL IMPRESSION / ASSESSMENT AND PLAN  / ED COURSE  Pertinent labs & imaging results that were available during my care of the patient were reviewed by me and considered in my medical decision making (see chart for details).    Clinical Course    Patient's blood pressure was elevated while in the emergency room however he denies any symptoms with his blood pressure. He states that he will make an appointment to see his primary care doctor. Attempts were made to find out what medication he is taking however patient does not have a list and it appears that he does not take any medications other than Synthroid, Ativan, insulin and Metamucil in his chart today. Patient will call his doctor to be seen. He was given instructions to watch area for signs of infection and return if any urgent concerns. Blood pressure was rechecked prior to his discharge however patient states that he is ready to go home and is up in the hallway. He  was again encouraged to follow up with his primary care doctor.  ____________________________________________   FINAL CLINICAL IMPRESSION(S) / ED DIAGNOSES  Final diagnoses:  None      NEW MEDICATIONS STARTED DURING THIS VISIT:  New Prescriptions   No medications on file     Note:  This document was prepared using Dragon voice recognition software and may include unintentional dictation errors.    Johnn Hai, PA-C 09/08/16 1552    Earleen Newport, MD 09/09/16 [phone]

## 2016-09-08 NOTE — ED Triage Notes (Signed)
Pt reports he cut the top of his head shaving it yesterday.

## 2016-09-08 NOTE — Discharge Instructions (Addendum)
Clean area daily with mild soap and water. You may apply a very thin layer of Neosporin to the area once a day. Watch for any signs of infection. Follow-up with her primary care doctor if any continued problems.

## 2016-09-08 NOTE — ED Notes (Signed)
Dressing removed from scalp   Area cleansed with saline

## 2016-09-08 NOTE — ED Notes (Signed)
Provider notified about BP, will recheck in 62min

## 2016-09-13 ENCOUNTER — Encounter: Payer: Self-pay | Admitting: Emergency Medicine

## 2016-09-13 ENCOUNTER — Emergency Department: Payer: Medicare HMO

## 2016-09-13 ENCOUNTER — Inpatient Hospital Stay
Admission: EM | Admit: 2016-09-13 | Discharge: 2016-09-16 | DRG: 300 | Disposition: A | Discharge status: Home / Self Care | Payer: Medicare HMO | Attending: Internal Medicine | Admitting: Internal Medicine

## 2016-09-13 DIAGNOSIS — Z87891 Personal history of nicotine dependence: Secondary | ICD-10-CM | POA: Diagnosis not present

## 2016-09-13 DIAGNOSIS — R9389 Abnormal findings on diagnostic imaging of other specified body structures: Secondary | ICD-10-CM

## 2016-09-13 DIAGNOSIS — E11319 Type 2 diabetes mellitus with unspecified diabetic retinopathy without macular edema: Secondary | ICD-10-CM | POA: Diagnosis present

## 2016-09-13 DIAGNOSIS — R2681 Unsteadiness on feet: Secondary | ICD-10-CM

## 2016-09-13 DIAGNOSIS — E1165 Type 2 diabetes mellitus with hyperglycemia: Secondary | ICD-10-CM | POA: Diagnosis present

## 2016-09-13 DIAGNOSIS — E1140 Type 2 diabetes mellitus with diabetic neuropathy, unspecified: Secondary | ICD-10-CM | POA: Diagnosis present

## 2016-09-13 DIAGNOSIS — Z7982 Long term (current) use of aspirin: Secondary | ICD-10-CM

## 2016-09-13 DIAGNOSIS — E039 Hypothyroidism, unspecified: Secondary | ICD-10-CM | POA: Diagnosis present

## 2016-09-13 DIAGNOSIS — I6523 Occlusion and stenosis of bilateral carotid arteries: Secondary | ICD-10-CM | POA: Diagnosis present

## 2016-09-13 DIAGNOSIS — IMO0002 Reserved for concepts with insufficient information to code with codable children: Secondary | ICD-10-CM

## 2016-09-13 DIAGNOSIS — E785 Hyperlipidemia, unspecified: Secondary | ICD-10-CM | POA: Diagnosis present

## 2016-09-13 DIAGNOSIS — Z79899 Other long term (current) drug therapy: Secondary | ICD-10-CM | POA: Diagnosis not present

## 2016-09-13 DIAGNOSIS — I129 Hypertensive chronic kidney disease with stage 1 through stage 4 chronic kidney disease, or unspecified chronic kidney disease: Secondary | ICD-10-CM | POA: Diagnosis present

## 2016-09-13 DIAGNOSIS — E876 Hypokalemia: Secondary | ICD-10-CM | POA: Diagnosis present

## 2016-09-13 DIAGNOSIS — L97519 Non-pressure chronic ulcer of other part of right foot with unspecified severity: Secondary | ICD-10-CM

## 2016-09-13 DIAGNOSIS — Z794 Long term (current) use of insulin: Secondary | ICD-10-CM

## 2016-09-13 DIAGNOSIS — Z23 Encounter for immunization: Secondary | ICD-10-CM

## 2016-09-13 DIAGNOSIS — M79604 Pain in right leg: Secondary | ICD-10-CM | POA: Diagnosis not present

## 2016-09-13 DIAGNOSIS — M79674 Pain in right toe(s): Secondary | ICD-10-CM | POA: Diagnosis present

## 2016-09-13 DIAGNOSIS — H5462 Unqualified visual loss, left eye, normal vision right eye: Secondary | ICD-10-CM | POA: Diagnosis present

## 2016-09-13 DIAGNOSIS — E871 Hypo-osmolality and hyponatremia: Secondary | ICD-10-CM | POA: Diagnosis present

## 2016-09-13 DIAGNOSIS — E1152 Type 2 diabetes mellitus with diabetic peripheral angiopathy with gangrene: Principal | ICD-10-CM | POA: Diagnosis present

## 2016-09-13 DIAGNOSIS — E1122 Type 2 diabetes mellitus with diabetic chronic kidney disease: Secondary | ICD-10-CM | POA: Diagnosis present

## 2016-09-13 DIAGNOSIS — N183 Chronic kidney disease, stage 3 (moderate): Secondary | ICD-10-CM | POA: Diagnosis present

## 2016-09-13 DIAGNOSIS — I96 Gangrene, not elsewhere classified: Secondary | ICD-10-CM | POA: Diagnosis present

## 2016-09-13 DIAGNOSIS — I739 Peripheral vascular disease, unspecified: Secondary | ICD-10-CM

## 2016-09-13 DIAGNOSIS — I70261 Atherosclerosis of native arteries of extremities with gangrene, right leg: Secondary | ICD-10-CM | POA: Diagnosis present

## 2016-09-13 DIAGNOSIS — L03115 Cellulitis of right lower limb: Secondary | ICD-10-CM

## 2016-09-13 DIAGNOSIS — I10 Essential (primary) hypertension: Secondary | ICD-10-CM

## 2016-09-13 DIAGNOSIS — Z8673 Personal history of transient ischemic attack (TIA), and cerebral infarction without residual deficits: Secondary | ICD-10-CM

## 2016-09-13 LAB — COMPREHENSIVE METABOLIC PANEL
ALK PHOS: 136 U/L — AB (ref 38–126)
ALT: 12 U/L — ABNORMAL LOW (ref 17–63)
ANION GAP: 7 (ref 5–15)
AST: 19 U/L (ref 15–41)
Albumin: 3.2 g/dL — ABNORMAL LOW (ref 3.5–5.0)
BILIRUBIN TOTAL: 0.5 mg/dL (ref 0.3–1.2)
BUN: 18 mg/dL (ref 6–20)
CALCIUM: 8.4 mg/dL — AB (ref 8.9–10.3)
CO2: 28 mmol/L (ref 22–32)
Chloride: 99 mmol/L — ABNORMAL LOW (ref 101–111)
Creatinine, Ser: 1.64 mg/dL — ABNORMAL HIGH (ref 0.61–1.24)
GFR calc non Af Amer: 42 mL/min — ABNORMAL LOW (ref >60)
GFR, EST AFRICAN AMERICAN: 49 mL/min — AB (ref >60)
Glucose, Bld: 447 mg/dL — ABNORMAL HIGH (ref 65–99)
POTASSIUM: 3 mmol/L — AB (ref 3.5–5.1)
SODIUM: 134 mmol/L — AB (ref 135–145)
TOTAL PROTEIN: 7.1 g/dL (ref 6.5–8.1)

## 2016-09-13 LAB — CBC WITH DIFFERENTIAL/PLATELET
Basophils Absolute: 0.1 10*3/uL (ref 0–0.1)
Basophils Relative: 1 %
EOS ABS: 0.2 10*3/uL (ref 0–0.7)
Eosinophils Relative: 3 %
HCT: 34.7 % — ABNORMAL LOW (ref 40.0–52.0)
HEMOGLOBIN: 11.5 g/dL — AB (ref 13.0–18.0)
LYMPHS ABS: 1.4 10*3/uL (ref 1.0–3.6)
Lymphocytes Relative: 20 %
MCH: 26.4 pg (ref 26.0–34.0)
MCHC: 33.2 g/dL (ref 32.0–36.0)
MCV: 79.4 fL — ABNORMAL LOW (ref 80.0–100.0)
MONO ABS: 0.6 10*3/uL (ref 0.2–1.0)
MONOS PCT: 9 %
NEUTROS PCT: 67 %
Neutro Abs: 4.9 10*3/uL (ref 1.4–6.5)
Platelets: 202 10*3/uL (ref 150–440)
RBC: 4.37 MIL/uL — ABNORMAL LOW (ref 4.40–5.90)
RDW: 13.1 % (ref 11.5–14.5)
WBC: 7.3 10*3/uL (ref 3.8–10.6)

## 2016-09-13 LAB — GLUCOSE, CAPILLARY
GLUCOSE-CAPILLARY: 241 mg/dL — AB (ref 65–99)
GLUCOSE-CAPILLARY: 450 mg/dL — AB (ref 65–99)
Glucose-Capillary: 154 mg/dL — ABNORMAL HIGH (ref 65–99)
Glucose-Capillary: 279 mg/dL — ABNORMAL HIGH (ref 65–99)

## 2016-09-13 LAB — SEDIMENTATION RATE: SED RATE: 63 mm/h — AB (ref 0–20)

## 2016-09-13 MED ORDER — INSULIN ASPART 100 UNIT/ML ~~LOC~~ SOLN
5.0000 [IU] | Freq: Once | SUBCUTANEOUS | Status: AC
  Administered 2016-09-13: 5 [IU] via INTRAVENOUS
  Filled 2016-09-13: qty 5

## 2016-09-13 MED ORDER — CITALOPRAM HYDROBROMIDE 20 MG PO TABS
20.0000 mg | ORAL_TABLET | Freq: Every day | ORAL | Status: DC
  Administered 2016-09-13 – 2016-09-16 (×4): 20 mg via ORAL
  Filled 2016-09-13 (×4): qty 1

## 2016-09-13 MED ORDER — INSULIN ASPART 100 UNIT/ML ~~LOC~~ SOLN
5.0000 [IU] | Freq: Three times a day (TID) | SUBCUTANEOUS | Status: DC
  Administered 2016-09-14 – 2016-09-15 (×4): 5 [IU] via SUBCUTANEOUS
  Filled 2016-09-13 (×3): qty 5
  Filled 2016-09-13: qty 2
  Filled 2016-09-13: qty 5

## 2016-09-13 MED ORDER — INSULIN DETEMIR 100 UNIT/ML ~~LOC~~ SOLN
28.0000 [IU] | Freq: Every day | SUBCUTANEOUS | Status: DC
  Administered 2016-09-13 – 2016-09-15 (×2): 28 [IU] via SUBCUTANEOUS
  Filled 2016-09-13 (×5): qty 0.28

## 2016-09-13 MED ORDER — ENOXAPARIN SODIUM 40 MG/0.4ML ~~LOC~~ SOLN
40.0000 mg | SUBCUTANEOUS | Status: DC
  Administered 2016-09-13 – 2016-09-15 (×3): 40 mg via SUBCUTANEOUS
  Filled 2016-09-13 (×3): qty 0.4

## 2016-09-13 MED ORDER — VANCOMYCIN HCL IN DEXTROSE 1-5 GM/200ML-% IV SOLN
1000.0000 mg | Freq: Once | INTRAVENOUS | Status: AC
  Administered 2016-09-13: 1000 mg via INTRAVENOUS
  Filled 2016-09-13: qty 200

## 2016-09-13 MED ORDER — SENNA 8.6 MG PO TABS
1.0000 | ORAL_TABLET | Freq: Two times a day (BID) | ORAL | Status: DC
  Administered 2016-09-13 – 2016-09-15 (×5): 8.6 mg via ORAL
  Filled 2016-09-13 (×6): qty 1

## 2016-09-13 MED ORDER — INFLUENZA VAC SPLIT QUAD 0.5 ML IM SUSY: 0.5000 mL | PREFILLED_SYRINGE | INTRAMUSCULAR | Status: DC

## 2016-09-13 MED ORDER — BISACODYL 5 MG PO TBEC: 5.0000 mg | DELAYED_RELEASE_TABLET | Freq: Every day | ORAL | Status: DC | PRN

## 2016-09-13 MED ORDER — ACETAMINOPHEN 325 MG PO TABS: 650.0000 mg | ORAL_TABLET | Freq: Four times a day (QID) | ORAL | Status: DC | PRN

## 2016-09-13 MED ORDER — PANCRELIPASE (LIP-PROT-AMYL) 12000-38000 UNITS PO CPEP
24000.0000 [IU] | ORAL_CAPSULE | Freq: Three times a day (TID) | ORAL | Status: DC
  Administered 2016-09-14 – 2016-09-16 (×7): 24000 [IU] via ORAL
  Filled 2016-09-13 (×8): qty 2

## 2016-09-13 MED ORDER — ATORVASTATIN CALCIUM 20 MG PO TABS
40.0000 mg | ORAL_TABLET | Freq: Every day | ORAL | Status: DC
  Administered 2016-09-13 – 2016-09-15 (×3): 40 mg via ORAL
  Filled 2016-09-13 (×3): qty 2

## 2016-09-13 MED ORDER — INSULIN ASPART 100 UNIT/ML ~~LOC~~ SOLN
0.0000 [IU] | Freq: Three times a day (TID) | SUBCUTANEOUS | Status: DC
Start: 2016-09-13 — End: 2016-09-16
  Administered 2016-09-13: 3 [IU] via SUBCUTANEOUS
  Administered 2016-09-14: 8 [IU] via SUBCUTANEOUS
  Administered 2016-09-15: 2 [IU] via SUBCUTANEOUS
  Administered 2016-09-15: 5 [IU] via SUBCUTANEOUS
  Administered 2016-09-16 (×2): 2 [IU] via SUBCUTANEOUS
  Filled 2016-09-13: qty 3
  Filled 2016-09-13: qty 8
  Filled 2016-09-13: qty 5
  Filled 2016-09-13: qty 1
  Filled 2016-09-13: qty 2

## 2016-09-13 MED ORDER — LEVOTHYROXINE SODIUM 75 MCG PO TABS
150.0000 ug | ORAL_TABLET | Freq: Every day | ORAL | Status: DC
  Administered 2016-09-14 – 2016-09-16 (×3): 150 ug via ORAL
  Filled 2016-09-13 (×3): qty 2

## 2016-09-13 MED ORDER — ACETAMINOPHEN 650 MG RE SUPP: 650.0000 mg | Freq: Four times a day (QID) | RECTAL | Status: DC | PRN

## 2016-09-13 MED ORDER — SODIUM CHLORIDE 0.9 % IV SOLN
3.0000 g | Freq: Four times a day (QID) | INTRAVENOUS | Status: DC
  Administered 2016-09-13 – 2016-09-16 (×12): 3 g via INTRAVENOUS
  Filled 2016-09-13 (×14): qty 3

## 2016-09-13 MED ORDER — HYDROXYZINE HCL 10 MG PO TABS
10.0000 mg | ORAL_TABLET | Freq: Four times a day (QID) | ORAL | Status: DC | PRN
  Administered 2016-09-13: 10 mg via ORAL
  Filled 2016-09-13 (×3): qty 1

## 2016-09-13 MED ORDER — TRAMADOL HCL 50 MG PO TABS
50.0000 mg | ORAL_TABLET | Freq: Four times a day (QID) | ORAL | Status: DC | PRN
  Administered 2016-09-13 – 2016-09-15 (×3): 50 mg via ORAL
  Filled 2016-09-13 (×3): qty 1

## 2016-09-13 NOTE — H&P (Signed)
[facility] [place] NAME: Cameron Adams    MR#:  [id number]  DATE OF BIRTH:  09-16-1951  DATE OF ADMISSION:  09/13/2016  PRIMARY CARE PHYSICIAN: Tora Duck, MD   REQUESTING/REFERRING PHYSICIAN: Dr Jacqualine Code  CHIEF COMPLAINT:   Right toe swelling and darkening for last few days HISTORY OF PRESENT ILLNESS:  Cameron Adams  is a 65 y.o. 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 Patient has had history of diabetic foot ulcer in the past comes in with darkening/discoloration of his right second toe. Denies any pain since he has significant neuropathy. In the ER patient received 1 dose of IV vancomycin. His white count is normal. His x-ray of the foot shows minimal air around the second toe. 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  Patient reports his sugars have been out of control lately. He takes insulin at home.  PAST MEDICAL HISTORY:   Past Medical History:  Diagnosis Date  . Blindness of left eye   . Chronic kidney disease, stage III (moderate)   . Diabetes ([place])   . Hypertension   . Syncope and collapse   . Thyroid dysfunction     PAST SURGICAL HISTOIRY:   Past Surgical History:  Procedure Laterality Date  . THYROID SURGERY      SOCIAL HISTORY:   Social History  Substance Use Topics  . Smoking status: Former Smoker    Packs/day: 0.10    Years: 25.00    Types: Cigarettes    Quit date: 10/31/1978  . Smokeless tobacco: Never Used     Comment: smoked 2-3 cigarettes/day  . Alcohol use No    FAMILY HISTORY:   Family History  Problem Relation Age of Onset  . Kidney failure Mother   . Heart disease Father   . Kidney failure Father   . Heart disease Brother     DRUG ALLERGIES:  No Known  Allergies  REVIEW OF SYSTEMS:  Review of Systems  Constitutional: Negative for chills, fever and weight loss.  HENT: Negative for ear discharge, ear pain and nosebleeds.   Eyes: Negative for blurred vision, pain and discharge.  Respiratory: Negative for sputum production, shortness of breath, wheezing and stridor.   Cardiovascular: Negative for chest pain, palpitations, orthopnea and PND.  Gastrointestinal: Negative for abdominal pain, diarrhea, nausea and vomiting.  Genitourinary: Negative for frequency and urgency.  Musculoskeletal: Positive for joint pain. Negative for back pain.  Neurological: Positive for weakness. Negative for sensory change, speech change and focal weakness.  Psychiatric/Behavioral: Negative for depression and hallucinations. The patient is not nervous/anxious.      MEDICATIONS AT HOME:   Prior to Admission medications   Medication Sig Start Date End Date Taking? Authorizing Provider  aspirin EC 81 MG tablet Take 81 mg by mouth daily.   Yes Historical Provider, MD  atorvastatin (LIPITOR) 40 MG tablet Take 40 mg by mouth at bedtime.   Yes Historical Provider, MD  citalopram (CELEXA) 20 MG tablet Take 20 mg by mouth daily.   Yes Historical Provider, MD  fludrocortisone (FLORINEF) 0.1 MG tablet Take 0.1 mg by mouth 2 (two) times daily.   Yes Historical Provider, MD  hydrOXYzine (ATARAX/VISTARIL) 10 MG tablet Take 10 mg by mouth every 6 (six) hours as needed  for itching.    Yes Historical Provider, MD  insulin aspart (NOVOLOG) 100 UNIT/ML injection Inject 5 Units into the skin 3 (three) times daily before meals.   Yes Historical Provider, MD  insulin detemir (LEVEMIR) 100 UNIT/ML injection Inject 28 Units into the skin at bedtime.   Yes Historical Provider, MD  levothyroxine (SYNTHROID, LEVOTHROID) 150 MCG tablet Take 150 mcg by mouth daily before breakfast.   Yes Historical Provider, MD  lipase/protease/amylase (CREON) 12000 units CPEP capsule Take 24,000 Units by  mouth 3 (three) times daily with meals.    Yes Historical Provider, MD      VITAL SIGNS:  Blood pressure 134/82, pulse 73, temperature 98.2 F (36.8 C), temperature source Oral, resp. rate 18, height 5\' 11"  (1.803 m), weight 78.5 kg (173 lb), SpO2 95 %.  PHYSICAL EXAMINATION:  GENERAL:  65 y.o.-year-old patient lying in the bed with no acute distress.  EYES: Pupils equal, round, reactive to light and accommodation. No scleral icterus. Extraocular muscles intact.  HEENT: Head atraumatic, normocephalic. Oropharynx and nasopharynx clear.  NECK:  Supple, no jugular venous distention. No thyroid enlargement, no tenderness.  LUNGS: Normal breath sounds bilaterally, no wheezing, rales,rhonchi or crepitation. No use of accessory muscles of respiration.  CARDIOVASCULAR: S1, S2 normal. No murmurs, rubs, or gallops.  ABDOMEN: Soft, nontender, nondistended. Bowel sounds present. No organomegaly or mass.  EXTREMITIES: No pedal edema, cyanosis, or clubbing. Right second toe appears to be dry with discoloration/gangrene and chipped off toenail. No discharge noted.  NEUROLOGIC: Cranial nerves II through XII are intact. Muscle strength 5/5 in all extremities.Patient has peripheral neuropathy bilaterally lower extremity psychiatry: The patient is alert and oriented x 3.  SKIN: No obvious rash, lesion Right second toe for ulcer as above   LABORATORY PANEL:   CBC  Recent Labs Lab 09/13/16 1201  WBC 7.3  HGB 11.5*  HCT 34.7*  PLT 202   ------------------------------------------------------------------------------------------------------------------  Chemistries   Recent Labs Lab 09/13/16 1201  NA 134*  K 3.0*  CL 99*  CO2 28  GLUCOSE 447*  BUN 18  CREATININE 1.64*  CALCIUM 8.4*  AST 19  ALT 12*  ALKPHOS 136*  BILITOT 0.5   ------------------------------------------------------------------------------------------------------------------  Cardiac Enzymes No results for input(s):  TROPONINI in the last 168 hours. ------------------------------------------------------------------------------------------------------------------  RADIOLOGY:  Dg Foot Complete Right  Result Date: 09/13/2016 CLINICAL DATA:  Diabetic with right foot swelling and second distal toe open draining wound. EXAM: RIGHT FOOT COMPLETE - 3+ VIEW COMPARISON:  08/24/2015 FINDINGS: Examination demonstrates mild air over the soft tissues adjacent the second distal phalanx compatible patient's open wound dorsally. Adjacent bony structures are within normal. There is mild degenerative change of the first MTP joint and midfoot. Improved soft tissue irregularity adjacent the distal first toe. Small vessel atherosclerotic disease is present. IMPRESSION: Minimal air in the soft tissues adjacent the second distal phalanx compatible with known open draining wound. Normal adjacent bony structures. Interval improvement and soft tissue irregularity adjacent the first distal phalanx. Small vessel atherosclerotic disease. Electronically Signed   By: Marin Olp M.D.   On: 09/13/2016 14:40    EKG:    IMPRESSION AND PLAN:    Mikyle Sox  is a 65 y.o. 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 Patient has had history of diabetic foot ulcer in the past comes in with darkening/discoloration  of his right second toe.   1. 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 Uncontrolled diabetes  -Resume home dose of Levemir and adjust sliding scale along with regular insulin   3. Chronic kidney disease stage III  -Creatinine baseline 1.7-2   -Avoid nephrotoxins   4. Hyperlipidemia on statins   5. Hypothyroidism continue Synthroid   6. DVT prophylaxis subcutaneous Lovenox   7. Patient is legally blind left eye secondary to diabetic retinopathy  All the records are reviewed and case discussed with ED provider. Management plans discussed with the patient, family and they are in agreement.  CODE STATUS: Full  TOTAL TIME TAKING CARE OF THIS PATIENT: 45s.    Adal Sereno M.D on 09/13/2016 at 3:45 PM  Between 7am to 6pm - Pager - [phone]  After 6pm go to [email or web address] - password [email or web address] [facility]  Office  [phone]  CC: Primary care physician; Tora Duck, MD

## 2016-09-13 NOTE — Consult Note (Signed)
Patient Demographics  Cameron Adams, is a 65 y.o. male   MRN: [id number]   DOB - 1951-03-25  Admit Date - 09/13/2016    Outpatient Primary MD for the patient is Tora Duck, MD  Consult requested in the Hospital by Fritzi Mandes, MD, On 09/13/2016    Reason for consult gangrene right second toe   With History of -  Past Medical History:  Diagnosis Date  . Blindness of left eye   . Chronic kidney disease, stage III (moderate)   . Diabetes ([place])   . Hypertension   . Syncope and collapse   . Thyroid dysfunction       Past Surgical History:  Procedure Laterality Date  . THYROID SURGERY      in for   Chief Complaint  Patient presents with  . Toe Pain     HPI  Cameron Adams  is a 65 y.o. male, Patient is a chronic long-standing diabetic with chronic kidney disease and blindness the left. States that he did not realize the toe was turning colors or was a significant until he had problems with a noticed earlier today and came to the ER.    Review of Systems  patient is alert and well-oriented is pleasant  In addition to the HPI above,  No Fever-chills, No Headache, No changes with Vision or hearing, patient is blind in left eye thinks is due to his diabetic retinopathy. No problems swallowing food or Liquids, No Chest pain, Cough or Shortness of Breath, No Abdominal pain, No Nausea or Vommitting, Bowel movements are regular, No Blood in stool or Urine, No dysuria, Skin: Skin is intact to the lower extremities except for the distal portion of the left second toe No new joints pains-aches,  No new weakness, tingling, numbness in any extremity, No recent weight gain or loss, No polyuria, polydypsia or polyphagia, No significant Mental Stressors.  A full 10 point Review of Systems was done,  except as stated above, all other Review of Systems were negative.   Social History Social History  Substance Use Topics  . Smoking status: Former Smoker    Packs/day: 0.10    Years: 25.00    Types: Cigarettes    Quit date: 10/31/1978  . Smokeless tobacco: Never Used     Comment: smoked 2-3 cigarettes/day  . Alcohol use No     Family History Family History  Problem Relation Age of Onset  . Kidney failure Mother   . Heart disease Father   . Kidney failure Father   . Heart disease Brother      Prior to Admission medications   Medication Sig Start Date End Date Taking? Authorizing Provider  aspirin EC 81 MG tablet Take 81 mg by mouth daily.   Yes Historical Provider, MD  atorvastatin (LIPITOR) 40 MG tablet Take 40 mg by mouth at bedtime.   Yes Historical Provider, MD  citalopram (CELEXA) 20 MG tablet Take 20 mg by mouth daily.   Yes Historical Provider, MD  fludrocortisone (FLORINEF) 0.1 MG tablet Take 0.1 mg by mouth 2 (two) times daily.   Yes Historical Provider, MD  hydrOXYzine (ATARAX/VISTARIL) 10 MG tablet Take 10 mg by mouth  every 6 (six) hours as needed for itching.    Yes Historical Provider, MD  insulin aspart (NOVOLOG) 100 UNIT/ML injection Inject 5 Units into the skin 3 (three) times daily before meals.   Yes Historical Provider, MD  insulin detemir (LEVEMIR) 100 UNIT/ML injection Inject 28 Units into the skin at bedtime.   Yes Historical Provider, MD  levothyroxine (SYNTHROID, LEVOTHROID) 150 MCG tablet Take 150 mcg by mouth daily before breakfast.   Yes Historical Provider, MD  lipase/protease/amylase (CREON) 12000 units CPEP capsule Take 24,000 Units by mouth 3 (three) times daily with meals.    Yes Historical Provider, MD    Anti-infectives    Start     Dose/Rate Route Frequency Ordered Stop   09/13/16 1700  Ampicillin-Sulbactam (UNASYN) 3 g in sodium chloride 0.9 % 100 mL IVPB     3 g 200 mL/hr over 30 Minutes Intravenous Every 6 hours 09/13/16 1606      09/13/16 1430  vancomycin (VANCOCIN) IVPB 1000 mg/200 mL premix     1,000 mg 200 mL/hr over 60 Minutes Intravenous  Once 09/13/16 1416 09/13/16 1536      Scheduled Meds: . insulin aspart  0-15 Units Subcutaneous TID WC   Continuous Infusions: . ampicillin-sulbactam (UNASYN) IV     PRN Meds:.  No Known Allergies  Physical Exam  Vitals  Blood pressure 134/82, pulse 73, temperature 98.2 F (36.8 C), temperature source Oral, resp. rate 18, height 5\' 11"  (1.803 m), weight 78.5 kg (173 lb), SpO2 95 %.  Lower Extremity exam:  Vascular:Nonpalpable pulses to both lower extremities. Duskiness to the right second toe starting at the MTP joint with blackened necrosis to the distal half of the toe on the plantar surface. Does currently have a wound of the tip of the toes well.  Neurological: Patient has significant peripheral neuropathy secondary to diabetes  Ortho: No gross deformities  Data Review  CBC  Recent Labs Lab 09/13/16 1201  WBC 7.3  HGB 11.5*  HCT 34.7*  PLT 202  MCV 79.4*  MCH 26.4  MCHC 33.2  RDW 13.1  LYMPHSABS 1.4  MONOABS 0.6  EOSABS 0.2  BASOSABS 0.1   ------------------------------------------------------------------------------------------------------------------  Chemistries   Recent Labs Lab 09/13/16 1201  NA 134*  K 3.0*  CL 99*  CO2 28  GLUCOSE 447*  BUN 18  CREATININE 1.64*  CALCIUM 8.4*  AST 19  ALT 12*  ALKPHOS 136*  BILITOT 0.5   ---------------------------------------------------------------------------------------------------------------------------------------------------------------------------------------  Urinalysis    Component Value Date/Time   COLORURINE YELLOW (A) 10/19/2015 1615   APPEARANCEUR CLEAR (A) 10/19/2015 1615   LABSPEC 1.015 10/19/2015 1615   PHURINE 5.0 10/19/2015 1615   GLUCOSEU >500 (A) 10/19/2015 1615   HGBUR NEGATIVE 10/19/2015 1615   BILIRUBINUR NEGATIVE 10/19/2015 1615   KETONESUR NEGATIVE  10/19/2015 1615   PROTEINUR NEGATIVE 10/19/2015 1615   NITRITE NEGATIVE 10/19/2015 1615   LEUKOCYTESUR NEGATIVE 10/19/2015 1615     Imaging results:   Dg Foot Complete Right  Result Date: 09/13/2016 CLINICAL DATA:  Diabetic with right foot swelling and second distal toe open draining wound. EXAM: RIGHT FOOT COMPLETE - 3+ VIEW COMPARISON:  08/24/2015 FINDINGS: Examination demonstrates mild air over the soft tissues adjacent the second distal phalanx compatible patient's open wound dorsally. Adjacent bony structures are within normal. There is mild degenerative change of the first MTP joint and midfoot. Improved soft tissue irregularity adjacent the distal first toe. Small vessel atherosclerotic disease is present. IMPRESSION: Minimal air in the soft tissues adjacent  the second distal phalanx compatible with known open draining wound. Normal adjacent bony structures. Interval improvement and soft tissue irregularity adjacent the first distal phalanx. Small vessel atherosclerotic disease. Electronically Signed   By: Marin Olp M.D.   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 Plan to get a vascular consult and like to see if they can improve the circulation before doing any partial or complete amputation of the digit. Based on the duskiness of the toe all the way to the base of the toe to pry need then MTPJ level amputation. Does not appear to have a very significant infection to the area. X-rays are fairly normal with no indication of bony infection or gas in the tissues. We'll try to get a vascular improvement see what vascular surgery has to say about possible angioplasty. Once were able to get that then we should be out of proceed with the amputation with the hopes of improved healing ability.  Active Problems:   Gangrene [facility])     Family Communication: Plan discussed with patient and  family   Perry Mount M.D on 09/13/2016 at 5:18 PM  Thank you for the consult, we will follow the patient with you in the Hospital.

## 2016-09-13 NOTE — Consult Note (Signed)
Pharmacy Antibiotic Note  Cameron Adams is a 65 y.o. male admitted on 09/13/2016 with diabetic foot infection.  Pharmacy has been consulted for unasyn dosing.  Plan: unasyn 3g q 6hr  Height: 5\' 11"  (180.3 cm) Weight: 173 lb (78.5 kg) IBW/kg (Calculated) : 75.3  Temp (24hrs), Avg:98.2 F (36.8 C), Min:98.2 F (36.8 C), Max:98.2 F (36.8 C)   Recent Labs Lab 09/13/16 1201  WBC 7.3  CREATININE 1.64*    Estimated Creatinine Clearance: 47.2 mL/min (by C-G formula based on SCr of 1.64 mg/dL (H)).    No Known Allergies  Antimicrobials this admission: unasyn 12/9 >>  vancomycin 12/9 >> one dose  Dose adjustments this admission:   Microbiology results: 12/9 BCx:   Thank you for allowing pharmacy to be a part of this patient's care.  Ramond Dial, Pharm.D, BCPS Clinical Pharmacist  09/13/2016 5:03 PM

## 2016-09-13 NOTE — ED Triage Notes (Signed)
Reports right second toe is swollen and discolored.

## 2016-09-13 NOTE — ED Provider Notes (Signed)
[facility] Emergency Department Provider Note   ____________________________________________   First MD Initiated Contact with Patient 09/13/16 1414     (approximate)  I have reviewed the triage vital signs and the nursing notes.   HISTORY  Chief Complaint Toe Pain    HPI Cameron Adams is a 65 y.o. male here for evaluation of trouble with his right second toe.  Patient is not quite sure but his right foot started feeling swollen at some point. He is noticed that his second toe has been looking dark. His concern that he may have an infection in his foot feels swollen. He is not sure if he is any fever or chills, but reports that he can't feel anything from about his ankles down on either foot due to diabetes and because of his poor vision he can't really see if there is a problem with his foot.     Past Medical History:  Diagnosis Date  . Blindness of left eye   . Chronic kidney disease, stage III (moderate)   . Diabetes ([place])   . Hypertension   . Syncope and collapse   . Thyroid dysfunction     Patient Active Problem List   Diagnosis Date Noted  . Dyspnea 11/09/2013  . CAP (community acquired pneumonia) 10/31/2013  . Chest pain 10/31/2013  . Chronic respiratory failure with hypoxia ([place]) 10/31/2013    Past Surgical History:  Procedure Laterality Date  . THYROID SURGERY      Prior to Admission medications   Medication Sig Start Date End Date Taking? Authorizing Provider  aspirin EC 81 MG tablet Take 81 mg by mouth daily.   Yes Historical Provider, MD  atorvastatin (LIPITOR) 40 MG tablet Take 40 mg by mouth at bedtime.   Yes Historical Provider, MD  citalopram (CELEXA) 20 MG tablet Take 20 mg by mouth daily.   Yes Historical Provider, MD  hydrOXYzine (ATARAX/VISTARIL) 10 MG tablet Take 10 mg by mouth every 6 (six) hours as needed for itching.    Yes Historical Provider, MD  insulin aspart (NOVOLOG) 100 UNIT/ML injection Inject 5 Units  into the skin 3 (three) times daily before meals.   Yes Historical Provider, MD  insulin detemir (LEVEMIR) 100 UNIT/ML injection Inject 28 Units into the skin at bedtime.   Yes Historical Provider, MD  levothyroxine (SYNTHROID, LEVOTHROID) 150 MCG tablet Take 150 mcg by mouth daily before breakfast.   Yes Historical Provider, MD  lipase/protease/amylase (CREON) 12000 units CPEP capsule Take 24,000 Units by mouth 3 (three) times daily with meals.    Yes Historical Provider, MD    Allergies Patient has no known allergies.  Family History  Problem Relation Age of Onset  . Kidney failure Mother   . Heart disease Father   . Kidney failure Father   . Heart disease Brother     Social History Social History  Substance Use Topics  . Smoking status: Former Smoker    Packs/day: 0.10    Years: 25.00    Types: Cigarettes    Quit date: 10/31/1978  . Smokeless tobacco: Never Used     Comment: smoked 2-3 cigarettes/day  . Alcohol use No    Review of Systems Constitutional: No fever/chills Eyes: No visual changes.He has very poor vision ENT: No sore throat. Cardiovascular: Denies chest pain. Respiratory: Denies shortness of breath. Gastrointestinal: No abdominal pain.  No nausea, no vomiting.   Genitourinary: Negative for dysuria. Musculoskeletal: Negative for back pain. Skin: Negative for rash. Neurological:  Negative for headaches, focal weakness or numbness.  10-point ROS otherwise negative.  ____________________________________________   PHYSICAL EXAM:  VITAL SIGNS: ED Triage Vitals  Enc Vitals Group     BP 09/13/16 1157 (!) 149/61     Pulse Rate 09/13/16 1157 73     Resp 09/13/16 1157 18     Temp 09/13/16 1157 98.2 F (36.8 C)     Temp Source 09/13/16 1157 Oral     SpO2 09/13/16 1157 100 %     Weight 09/13/16 1159 173 lb (78.5 kg)     Height 09/13/16 1159 5\' 11"  (1.803 m)     Head Circumference --      Peak Flow --      Pain Score --      Pain Loc --      Pain Edu?  --      Excl. in [place]? --    Constitutional: Alert and oriented. Well appearing and in no acute distress. Eyes: Conjunctivae are normal. PERRL. EOMI. Head: Atraumatic. Nose: No congestion/rhinnorhea. Mouth/Throat: Mucous membranes are moist.  Oropharynx non-erythematous. Neck: No stridor.   Cardiovascular: Normal rate, regular rhythm. Grossly normal heart sounds.  Good peripheral circulation. Respiratory: Normal respiratory effort.  No retractions.  Gastrointestinal: Soft and nontender. No distention.  Musculoskeletal: No lower extremity tenderness, Patient reports Feel his feet bilaterally. Patient has dopplerable dorsalis pedis and posterior tibial pulses in the right foot, the right dorsal aspect of the foot is notably edematous from about mid foot down with slight erythema and warmth, and a second right toe that appears distally necrotic and ischemic. Remaining toes no evidence of acute abnormality. No Refill. Neurologic:  Normal speech and language. No gross focal neurologic deficits are appreciated.  Skin:  Skin is warm, dry and intact. No rash noted. Psychiatric: Mood and affect are normal. Speech and behavior are normal.  ____________________________________________   LABS (all labs ordered are listed, but only abnormal results are displayed)  Labs Reviewed  CBC WITH DIFFERENTIAL/PLATELET - Abnormal; Notable for the following:       Result Value   RBC 4.37 (*)    Hemoglobin 11.5 (*)    HCT 34.7 (*)    MCV 79.4 (*)    All other components within normal limits  COMPREHENSIVE METABOLIC PANEL - Abnormal; Notable for the following:    Sodium 134 (*)    Potassium 3.0 (*)    Chloride 99 (*)    Glucose, Bld 447 (*)    Creatinine, Ser 1.64 (*)    Calcium 8.4 (*)    Albumin 3.2 (*)    ALT 12 (*)    Alkaline Phosphatase 136 (*)    GFR calc non Af Amer 42 (*)    GFR calc Af Amer 49 (*)    All other components within normal limits  GLUCOSE, CAPILLARY - Abnormal; Notable for the  following:    Glucose-Capillary 450 (*)    All other components within normal limits  CULTURE, BLOOD (ROUTINE X 2)  CULTURE, BLOOD (ROUTINE X 2)  SEDIMENTATION RATE  CBG MONITORING, ED  CBG MONITORING, ED   ____________________________________________  EKG   ____________________________________________  RADIOLOGY  Dg Foot Complete Right  Result Date: 09/13/2016 CLINICAL DATA:  Diabetic with right foot swelling and second distal toe open draining wound. EXAM: RIGHT FOOT COMPLETE - 3+ VIEW COMPARISON:  08/24/2015 FINDINGS: Examination demonstrates mild air over the soft tissues adjacent the second distal phalanx compatible patient's open wound dorsally. Adjacent bony structures are within  normal. There is mild degenerative change of the first MTP joint and midfoot. Improved soft tissue irregularity adjacent the distal first toe. Small vessel atherosclerotic disease is present. IMPRESSION: Minimal air in the soft tissues adjacent the second distal phalanx compatible with known open draining wound. Normal adjacent bony structures. Interval improvement and soft tissue irregularity adjacent the first distal phalanx. Small vessel atherosclerotic disease. Electronically Signed   By: Marin Olp M.D.   On: 09/13/2016 14:40    ____________________________________________   PROCEDURES  Procedure(s) performed: None  Procedures  Critical Care performed: No  ____________________________________________   INITIAL IMPRESSION / ASSESSMENT AND PLAN / ED COURSE  Pertinent labs & imaging results that were available during my care of the patient were reviewed by me and considered in my medical decision making (see chart for details).  Patient with notable necrotic right second toe, associated with diabetes and hyperglycemia with erythema and warmth/edema tracking of the midfoot suspicious for cellulitis and diabetic foot ulcer/gangrene. Discussed with Dr. Elvina Mattes who will perform consultation,  hospitalist service admitting.  Clinical Course      ____________________________________________   FINAL CLINICAL IMPRESSION(S) / ED DIAGNOSES  Final diagnoses:  Dry gangrene ([place])  Cellulitis of right foot      NEW MEDICATIONS STARTED DURING THIS VISIT:  New Prescriptions   No medications on file     Note:  This document was prepared using Dragon voice recognition software and may include unintentional dictation errors.     Delman Kitten, MD 09/13/16 [phone]

## 2016-09-14 ENCOUNTER — Inpatient Hospital Stay: Payer: Medicare HMO

## 2016-09-14 LAB — GLUCOSE, CAPILLARY
GLUCOSE-CAPILLARY: 75 mg/dL (ref 65–99)
GLUCOSE-CAPILLARY: 74 mg/dL (ref 65–99)
GLUCOSE-CAPILLARY: 90 mg/dL (ref 65–99)
Glucose-Capillary: 289 mg/dL — ABNORMAL HIGH (ref 65–99)

## 2016-09-14 MED ORDER — POTASSIUM CHLORIDE CRYS ER 20 MEQ PO TBCR
40.0000 meq | EXTENDED_RELEASE_TABLET | Freq: Once | ORAL | Status: AC
  Administered 2016-09-14: 40 meq via ORAL
  Filled 2016-09-14: qty 2

## 2016-09-14 MED ORDER — DOCUSATE SODIUM 100 MG PO CAPS
100.0000 mg | ORAL_CAPSULE | Freq: Two times a day (BID) | ORAL | Status: DC | PRN
Start: 2016-09-14 — End: 2016-09-16
  Administered 2016-09-14: 100 mg via ORAL
  Filled 2016-09-14: qty 1

## 2016-09-14 MED ORDER — SODIUM CHLORIDE 0.9 % IV SOLN
INTRAVENOUS | Status: DC
  Administered 2016-09-14: 12:00:00 via INTRAVENOUS

## 2016-09-14 NOTE — Consult Note (Signed)
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 Rosalee Kaufman Cameron Adams is an 65 y.o. male.  HPI: Patient with poorly controlled DM, significant peripheral neuropathy, diabetic retinopathy, CKD 3. Patient noticed his right foot was swollen ~1 week ago. It progressed and he subsequently noted right 2nd toe discoloration. He denies drainage, erythema, tenderness. He denies claudication or rest pain, he is able to perform his daily activities only limited by his decreased baseline vision. He only noted discomfort in the lower calf a few days ago. Denies fever, chills. He does report what sounds like a TIA with transient left upper extremity weakness ~6 months ago. He did not seek treatment at that time. He lives alone and states overall he has not taken good care of himself or monitored his blood sugars closely.  Past Medical History:  Diagnosis Date  . Blindness of left eye   . Chronic kidney disease, stage III (moderate)   . Diabetes ([place])   . Hypertension   . Syncope and collapse   . Thyroid dysfunction     Past Surgical History:  Procedure Laterality Date  . THYROID SURGERY      Family History  Problem Relation Age of Onset  . Kidney failure Mother   . Heart disease Father   . Kidney failure Father   . Heart disease Brother     Social History:  reports that he quit smoking about 37 years ago. His smoking use included Cigarettes. He has a 2.50 pack-year smoking history. He has never used smokeless tobacco. He reports that he does not drink alcohol or use drugs.  Allergies: No Known Allergies  Medications: I have reviewed the patient's current medications.  Results for orders placed or performed during the hospital encounter of 09/13/16 (from the past 48 hour(s))  CBC with Differential     Status: Abnormal   Collection Time: 09/13/16 12:01 PM  Result Value Ref Range   WBC 7.3 3.8 - 10.6 K/uL   RBC 4.37 (L) 4.40 - 5.90 MIL/uL   Hemoglobin 11.5  (L) 13.0 - 18.0 g/dL   HCT 34.7 (L) 40.0 - 52.0 %   MCV 79.4 (L) 80.0 - 100.0 fL   MCH 26.4 26.0 - 34.0 pg   MCHC 33.2 32.0 - 36.0 g/dL   RDW 13.1 11.5 - 14.5 %   Platelets 202 150 - 440 K/uL   Neutrophils Relative % 67 %   Neutro Abs 4.9 1.4 - 6.5 K/uL   Lymphocytes Relative 20 %   Lymphs Abs 1.4 1.0 - 3.6 K/uL   Monocytes Relative 9 %   Monocytes Absolute 0.6 0.2 - 1.0 K/uL   Eosinophils Relative 3 %   Eosinophils Absolute 0.2 0 - 0.7 K/uL   Basophils Relative 1 %   Basophils Absolute 0.1 0 - 0.1 K/uL  Comprehensive metabolic panel     Status: Abnormal   Collection Time: 09/13/16 12:01 PM  Result Value Ref Range   Sodium 134 (L) 135 - 145 mmol/L   Potassium 3.0 (L) 3.5 - 5.1 mmol/L   Chloride 99 (L) 101 - 111 mmol/L   CO2 28 22 - 32 mmol/L   Glucose, Bld 447 (H) 65 - 99 mg/dL   BUN 18 6 - 20 mg/dL   Creatinine, Ser 1.64 (H) 0.61 - 1.24 mg/dL   Calcium 8.4 (L) 8.9 - 10.3 mg/dL   Total Protein 7.1 6.5 - 8.1 g/dL   Albumin 3.2 (L) 3.5 - 5.0 g/dL  AST 19 15 - 41 U/L   ALT 12 (L) 17 - 63 U/L   Alkaline Phosphatase 136 (H) 38 - 126 U/L   Total Bilirubin 0.5 0.3 - 1.2 mg/dL   GFR calc non Af Amer 42 (L) >60 mL/min   GFR calc Af Amer 49 (L) >60 mL/min    Comment: (NOTE) The eGFR has been calculated using the CKD EPI equation. This calculation has not been validated in all clinical situations. eGFR's persistently <60 mL/min signify possible Chronic Kidney Disease.    Anion gap 7 5 - 15  Sedimentation rate     Status: Abnormal   Collection Time: 09/13/16  2:29 PM  Result Value Ref Range   Sed Rate 63 (H) 0 - 20 mm/hr  Culture, blood (Routine X 2) w Reflex to ID Panel     Status: None (Preliminary result)   Collection Time: 09/13/16  2:29 PM  Result Value Ref Range   Specimen Description BLOOD LEFT ARM    Special Requests      BOTTLES DRAWN AEROBIC AND ANAEROBIC AER 8CC ANA 11 CC   Culture NO GROWTH < 24 HOURS    Report Status PENDING   Culture, blood (Routine X 2) w  Reflex to ID Panel     Status: None (Preliminary result)   Collection Time: 09/13/16  2:29 PM  Result Value Ref Range   Specimen Description BLOOD RIGHT ARM    Special Requests      BOTTLES DRAWN AEROBIC AND ANAEROBIC AER [place] ANA [place]   Culture NO GROWTH < 24 HOURS    Report Status PENDING   Glucose, capillary     Status: Abnormal   Collection Time: 09/13/16  2:34 PM  Result Value Ref Range   Glucose-Capillary 450 (H) 65 - 99 mg/dL  Glucose, capillary     Status: Abnormal   Collection Time: 09/13/16  3:20 PM  Result Value Ref Range   Glucose-Capillary 241 (H) 65 - 99 mg/dL  Glucose, capillary     Status: Abnormal   Collection Time: 09/13/16  5:56 PM  Result Value Ref Range   Glucose-Capillary 154 (H) 65 - 99 mg/dL   Comment 1 Notify RN   Glucose, capillary     Status: Abnormal   Collection Time: 09/13/16 10:13 PM  Result Value Ref Range   Glucose-Capillary 279 (H) 65 - 99 mg/dL   Comment 1 Notify RN   Glucose, capillary     Status: Abnormal   Collection Time: 09/14/16  7:22 AM  Result Value Ref Range   Glucose-Capillary 289 (H) 65 - 99 mg/dL   Comment 1 Notify RN   Glucose, capillary     Status: None   Collection Time: 09/14/16 11:17 AM  Result Value Ref Range   Glucose-Capillary 75 65 - 99 mg/dL   Comment 1 Notify RN     Dg Foot Complete Right  Result Date: 09/13/2016 CLINICAL DATA:  Diabetic with right foot swelling and second distal toe open draining wound. EXAM: RIGHT FOOT COMPLETE - 3+ VIEW COMPARISON:  08/24/2015 FINDINGS: Examination demonstrates mild air over the soft tissues adjacent the second distal phalanx compatible patient's open wound dorsally. Adjacent bony structures are within normal. There is mild degenerative change of the first MTP joint and midfoot. Improved soft tissue irregularity adjacent the distal first toe. Small vessel atherosclerotic disease is present. IMPRESSION: Minimal air in the soft tissues adjacent the second distal phalanx compatible with  known open draining wound. Normal adjacent bony structures.  Interval improvement and soft tissue irregularity adjacent the first distal phalanx. Small vessel atherosclerotic disease. Electronically Signed   By: Marin Olp M.D.   On: 09/13/2016 14:40    ROS Blood pressure (!) 169/67, pulse 69, temperature 98.1 F (36.7 C), temperature source Oral, resp. rate 18, height 5' 11"  (1.803 m), weight 78.5 kg (173 lb), SpO2 99 %. Physical Exam  Vitals reviewed. Constitutional: He is oriented to person, place, and time. He appears well-developed and well-nourished. No distress.  HENT:  Head: Normocephalic.  Neck: Normal range of motion. No JVD present.  Cardiovascular: Normal rate and regular rhythm.   Palpable right DP pulse. Biphasic PT. Foot warm.  Left Palpable DP/PT  Respiratory: Effort normal and breath sounds normal. No respiratory distress.  GI: Soft. He exhibits distension. There is no tenderness.  Musculoskeletal: He exhibits edema.  Right: lower extremity edema of calf and foot, erythema over dorsum of foot, non tender. 2nd toe- dry gangrene with area of ulceration-dry over dorsum of distal 2nd metatarsal. No drainage. 3rd toe- early gangrene, no ulcerations or lesions  Neurological: He is alert and oriented to person, place, and time.  Skin: Skin is warm. There is erythema.    Assessment/Plan:  Patient with diabetic right 2nd toe infection/dry gangrene. Patient has palpable right DP and biphasic PT signal. The presentation is likely secondary to DM infection and gangrene rather than gross/severe vascular compromise or ischemia.   Recommend vascular duplex/ABI prior to invasive procedures at this point. Continue Betadine toe 2nd toe and IV ABX.  Also, patient reports TIA symptoms in the past without investigation. Will obtain Carotid duplex.  Alexandra Posadas A 09/14/2016, 1:54 PM

## 2016-09-14 NOTE — Clinical Social Work Note (Signed)
CSW received consult that just said "d/c". The CSW spoke with the RN covering the patient, and she was unsure as to what that consult was for. CSW will follow pending PT recommendations. No other CSW involvement seems evident looking at the chart at this time.  Santiago Bumpers, MSW, LCSW-A [phone]

## 2016-09-14 NOTE — Progress Notes (Signed)
PT Cancellation Note  Patient Details Name: Lamir Racca MRN: [id number] DOB: 09-08-1951   Cancelled Treatment:    Reason Eval/Treat Not Completed: Medical issues which prohibited therapy. Consult received. Upon chart review, patient noted to have 3.0 K+ levels, contraindicating movement analysis. PT will continue to follow and perform evaluation when medically ready.   Dorice Lamas, PT, DPT 09/14/2016, 10:44 AM

## 2016-09-14 NOTE — Progress Notes (Signed)
[facility] [place] NAME: Cameron Adams    MR#:  [id number]  DATE OF BIRTH:  June 18, 1951  SUBJECTIVE:  CHIEF COMPLAINT:   Chief Complaint  Patient presents with  . Toe Pain  The patient is a 65 year old African American male with past medical history significant for history of diabetes, CK D stage III, hypothyroidism, who presents to the hospital with complaints of swelling and pain in right foot, right second toe dark discoloration. On arrival to the hospital. It was felt to have right second toe gangrene and was admitted. He was seen by podiatrist, who recommended vascular evaluation due to poor peripheral pulses. X-ray of right foot revealed minimal ear and subcutaneous tissues at second distal phalanx compatible with open draining wound, per radiologist. Podiatrist did not feel that patient had progressing cellulitis or infection. The patient is initiated on Unasyn.    Review of Systems  Constitutional: Negative for chills, fever and weight loss.  HENT: Negative for congestion.   Eyes: Negative for blurred vision and double vision.  Respiratory: Negative for cough, sputum production, shortness of breath and wheezing.   Cardiovascular: Negative for chest pain, palpitations, orthopnea, leg swelling and PND.  Gastrointestinal: Negative for abdominal pain, blood in stool, constipation, diarrhea, nausea and vomiting.  Genitourinary: Negative for dysuria, frequency, hematuria and urgency.  Musculoskeletal: Negative for falls.  Neurological: Negative for dizziness, tremors, focal weakness and headaches.  Endo/Heme/Allergies: Does not bruise/bleed easily.  Psychiatric/Behavioral: Negative for depression. The patient does not have insomnia.     VITAL SIGNS: Blood pressure (!) 169/67, pulse 69, temperature 98.1 F (36.7 C), temperature source Oral, resp. rate 18, height 5\' 11"  (1.803 m), weight 78.5 kg (173 lb), SpO2 99 %.  PHYSICAL  EXAMINATION:   GENERAL:  65 y.o.-year-old patient lying in the bed with no acute distress.  EYES: Pupils equal, round, reactive to light and accommodation. No scleral icterus. Extraocular muscles intact.  HEENT: Head atraumatic, normocephalic. Oropharynx and nasopharynx clear.  NECK:  Supple, no jugular venous distention. No thyroid enlargement, no tenderness.  LUNGS: Normal breath sounds bilaterally, no wheezing, rales,rhonchi or crepitation. No use of accessory muscles of respiration.  CARDIOVASCULAR: S1, S2 normal. No murmurs, rubs, or gallops.  ABDOMEN: Soft, nontender, nondistended. Bowel sounds present. No organomegaly or mass.  EXTREMITIES: Right foot, pedal edema, right second toe distal aspect gangrene, some cyanosis , but no clubbing. Dorsalis pedis pulses are palpable bilaterally NEUROLOGIC: Cranial nerves II through XII are intact. Muscle strength 5/5 in all extremities. Sensation decreased to light touch bilaterally in distal lower extremities. Gait not checked.  PSYCHIATRIC: The patient is alert and oriented x 3.  SKIN: No obvious rash, lesion, or ulcer.   ORDERS/RESULTS REVIEWED:   CBC  Recent Labs Lab 09/13/16 1201  WBC 7.3  HGB 11.5*  HCT 34.7*  PLT 202  MCV 79.4*  MCH 26.4  MCHC 33.2  RDW 13.1  LYMPHSABS 1.4  MONOABS 0.6  EOSABS 0.2  BASOSABS 0.1   ------------------------------------------------------------------------------------------------------------------  Chemistries   Recent Labs Lab 09/13/16 1201  NA 134*  K 3.0*  CL 99*  CO2 28  GLUCOSE 447*  BUN 18  CREATININE 1.64*  CALCIUM 8.4*  AST 19  ALT 12*  ALKPHOS 136*  BILITOT 0.5   ------------------------------------------------------------------------------------------------------------------ estimated creatinine clearance is 47.2 mL/min (by C-G formula based on SCr of 1.64 mg/dL  (H)). ------------------------------------------------------------------------------------------------------------------ No results for input(s): TSH, T4TOTAL, T3FREE, THYROIDAB in the last 72 hours.  Invalid  input(s): FREET3  Cardiac Enzymes No results for input(s): CKMB, TROPONINI, MYOGLOBIN in the last 168 hours.  Invalid input(s): CK ------------------------------------------------------------------------------------------------------------------ Invalid input(s): POCBNP ---------------------------------------------------------------------------------------------------------------  RADIOLOGY: Dg Foot Complete Right  Result Date: 09/13/2016 CLINICAL DATA:  Diabetic with right foot swelling and second distal toe open draining wound. EXAM: RIGHT FOOT COMPLETE - 3+ VIEW COMPARISON:  08/24/2015 FINDINGS: Examination demonstrates mild air over the soft tissues adjacent the second distal phalanx compatible patient's open wound dorsally. Adjacent bony structures are within normal. There is mild degenerative change of the first MTP joint and midfoot. Improved soft tissue irregularity adjacent the distal first toe. Small vessel atherosclerotic disease is present. IMPRESSION: Minimal air in the soft tissues adjacent the second distal phalanx compatible with known open draining wound. Normal adjacent bony structures. Interval improvement and soft tissue irregularity adjacent the first distal phalanx. Small vessel atherosclerotic disease. Electronically Signed   By: Marin Olp M.D.   On: 09/13/2016 14:40    EKG:  Orders placed or performed during the hospital encounter of 10/19/15  . ED EKG  . ED EKG  . EKG 12-Lead  . EKG 12-Lead    ASSESSMENT AND PLAN:  Active Problems:   Gangrene ([place])  #1. Right second toe gangrene, continue Unasyn, blood cultures are negative so far, but that this is planning amputation of the second toe. #2. Suspected peripheral vascular disease, vascular consultation  is pending #3. Chronic renal insufficiency, CK D stage III, seems to be stable, initiate patient on IV fluids, follow creatinine in the morning #4. Hyponatremia, likely due to intravascular depletion, continue IV fluids, follow sodium level in the morning #5. Hypokalemia, supplement orally, follow in the morning #6. Diabetes mellitus with significant hyperglycemia and admission, continue Levemir, sliding scale insulin, diabetic diet, get hemoglobin A1c  Management plans discussed with the patient, family and they are in agreement.   DRUG ALLERGIES: No Known Allergies  CODE STATUS:     Code Status Orders        Start     Ordered   09/13/16 1756  Full code  Continuous     09/13/16 1756    Code Status History    Date Active Date Inactive Code Status Order ID Comments User Context   This patient has a current code status but no historical code status.      TOTAL TIME TAKING CARE OF THIS PATIENT: 50 minutes.  Discussed with patient's daughter extensively, all questions were answered, she voiced understanding and appreciation  Cornelio Parkerson M.D on 09/14/2016 at 3:06 PM  Between 7am to 6pm - Pager - [phone]  After 6pm go to [email or web address] - password [email or web address] [facility]  Office  [phone]  CC: Primary care physician; Tora Duck, MD

## 2016-09-14 NOTE — Progress Notes (Signed)
Patient Demographics  Cameron Adams, is a 65 y.o. male   MRN: [id number]   DOB - [birth year]  Admit Date - 09/13/2016    Outpatient Primary MD for the patient is Tora Duck, MD  Consult requested in the Hospital by Theodoro Grist, MD, On 09/14/2016   With History of -  Past Medical History:  Diagnosis Date  . Blindness of left eye   . Chronic kidney disease, stage III (moderate)   . Diabetes ([place])   . Hypertension   . Syncope and collapse   . Thyroid dysfunction       Past Surgical History:  Procedure Laterality Date  . THYROID SURGERY      in for   Chief Complaint  Patient presents with  . Toe Pain     HPI  Cameron Adams  is a 65 y.o. male, Admitted to the hospital yesterday due to gangrenous changes to the right second toe. Blood sugar was also significantly high at 450. He is a chronic diabetic with chronic kidney disease. Currently is not on dialysis.    Review of Systems  : He is alert and well-oriented relates no significant increase in pain. States he feels a little better with it today.  In addition to the HPI above,  No Fever-chills, No Headache, No changes with Vision or hearing, but patient does have blindness of the left No problems swallowing food or Liquids, No Chest pain, Cough or Shortness of Breath, No Abdominal pain, No Nausea or Vommitting, Bowel movements are regular, No Blood in stool or Urine, No dysuria,  No new joints pains-aches,  No new weakness, tingling, numbness in any extremity, No recent weight gain or loss, No polyuria, polydypsia or polyphagia, No significant Mental Stressors.  A full 10 point Review of Systems was done, except as stated above, all other Review of Systems were negative.   Social History Social History  Substance Use Topics  .  Smoking status: Former Smoker    Packs/day: 0.10    Years: 25.00    Types: Cigarettes    Quit date: 10/31/1978  . Smokeless tobacco: Never Used     Comment: smoked 2-3 cigarettes/day  . Alcohol use No  Family History Family History  Problem Relation Age of Onset  . Kidney failure Mother   . Heart disease Father   . Kidney failure Father   . Heart disease Brother     Prior to Admission medications   Medication Sig Start Date End Date Taking? Authorizing Provider  aspirin EC 81 MG tablet Take 81 mg by mouth daily.   Yes Historical Provider, MD  atorvastatin (LIPITOR) 40 MG tablet Take 40 mg by mouth at bedtime.   Yes Historical Provider, MD  citalopram (CELEXA) 20 MG tablet Take 20 mg by mouth daily.   Yes Historical Provider, MD  fludrocortisone (FLORINEF) 0.1 MG tablet Take 0.1 mg by mouth 2 (two) times daily.   Yes Historical Provider, MD  hydrOXYzine (ATARAX/VISTARIL) 10 MG tablet Take 10 mg by mouth every 6 (six) hours as needed for itching.    Yes Historical Provider, MD  insulin aspart (NOVOLOG) 100 UNIT/ML injection Inject 5 Units into the skin 3 (  three) times daily before meals.   Yes Historical Provider, MD  insulin detemir (LEVEMIR) 100 UNIT/ML injection Inject 28 Units into the skin at bedtime.   Yes Historical Provider, MD  levothyroxine (SYNTHROID, LEVOTHROID) 150 MCG tablet Take 150 mcg by mouth daily before breakfast.   Yes Historical Provider, MD  lipase/protease/amylase (CREON) 12000 units CPEP capsule Take 24,000 Units by mouth 3 (three) times daily with meals.    Yes Historical Provider, MD    Anti-infectives    Start     Dose/Rate Route Frequency Ordered Stop   09/13/16 1700  Ampicillin-Sulbactam (UNASYN) 3 g in sodium chloride 0.9 % 100 mL IVPB     3 g 200 mL/hr over 30 Minutes Intravenous Every 6 hours 09/13/16 1606     09/13/16 1430  vancomycin (VANCOCIN) IVPB 1000 mg/200 mL premix     1,000 mg 200 mL/hr over 60 Minutes Intravenous  Once 09/13/16 1416  09/13/16 1536      Scheduled Meds: . ampicillin-sulbactam (UNASYN) IV  3 g Intravenous Q6H  . atorvastatin  40 mg Oral QHS  . citalopram  20 mg Oral Daily  . enoxaparin (LOVENOX) injection  40 mg Subcutaneous Q24H  . Influenza vac split quadrivalent PF  0.5 mL Intramuscular Tomorrow-1000  . insulin aspart  0-15 Units Subcutaneous TID WC  . insulin aspart  5 Units Subcutaneous TID AC  . insulin detemir  28 Units Subcutaneous QHS  . levothyroxine  150 mcg Oral QAC breakfast  . lipase/protease/amylase  24,000 Units Oral TID WC  . senna  1 tablet Oral BID  No Known Allergies  Physical Exam  Vitals  Blood pressure (!) 90/58, pulse 64, temperature 97.9 F (36.6 C), temperature source Oral, resp. rate 18, height 5\' 11"  (1.803 m), weight 78.5 kg (173 lb), SpO2 98 %.  Lower Extremity exam:Gangrene to the distal one half of the right second toe. Some discoloration is noted to the second toe with some mild cyanosis but no necrosis at this juncture. Swelling to the foot is better today. No evidence of progressing cellulitis redness or infection. No significant lymphadenopathy is noted.  Data Review  CBC  Recent Labs Lab 09/13/16 1201  WBC 7.3  HGB 11.5*  HCT 34.7*  PLT 202  MCV 79.4*  MCH 26.4  MCHC 33.2  RDW 13.1  LYMPHSABS 1.4  MONOABS 0.6  EOSABS 0.2  BASOSABS 0.1   ------------------------------------------------------------------------------------------------------------------  Urinalysis    Component Value Date/Time   COLORURINE YELLOW (A) 10/19/2015 1615   APPEARANCEUR CLEAR (A) 10/19/2015 1615   LABSPEC 1.015 10/19/2015 1615   PHURINE 5.0 10/19/2015 1615   GLUCOSEU >500 (A) 10/19/2015 1615   HGBUR NEGATIVE 10/19/2015 1615   BILIRUBINUR NEGATIVE 10/19/2015 1615   KETONESUR NEGATIVE 10/19/2015 1615   PROTEINUR NEGATIVE 10/19/2015 1615   NITRITE NEGATIVE 10/19/2015 1615   LEUKOCYTESUR NEGATIVE 10/19/2015 1615     Imaging results:   Dg Foot Complete  Right  Result Date: 09/13/2016 CLINICAL DATA:  Diabetic with right foot swelling and second distal toe open draining wound. EXAM: RIGHT FOOT COMPLETE - 3+ VIEW COMPARISON:  08/24/2015 FINDINGS: Examination demonstrates mild air over the soft tissues adjacent the second distal phalanx compatible patient's open wound dorsally. Adjacent bony structures are within normal. There is mild degenerative change of the first MTP joint and midfoot. Improved soft tissue irregularity adjacent the distal first toe. Small vessel atherosclerotic disease is present. IMPRESSION: Minimal air in the soft tissues adjacent the second distal phalanx compatible with known open  draining wound. Normal adjacent bony structures. Interval improvement and soft tissue irregularity adjacent the first distal phalanx. Small vessel atherosclerotic disease. Electronically Signed   By: Marin Olp M.D.   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 Gangrenous changes to the second toe of the right foot with some early cyanosis to the third toe of the right foot. Plan: I asked her consult is in place we await see what they can do hopefully soon to improve his blood flow to this region. Once this has established and the weekend proceed with likely amputation of the second toe. This was at least a partial amputation of not a total amputation but it likely he'll better at the MTP joint level. Dressing changes are ordered for the patient daily . I will follow him again tomorrow  Active Problems:   Gangrene [facility])   Family Communication: Plan discussed with patient    Perry Mount M.D on 09/14/2016 at 9:53 AM

## 2016-09-15 ENCOUNTER — Encounter
Admission: EM | Disposition: A | Discharge status: Home / Self Care | Payer: Self-pay | Source: Home / Self Care | Attending: Internal Medicine

## 2016-09-15 ENCOUNTER — Inpatient Hospital Stay: Payer: Medicare HMO

## 2016-09-15 DIAGNOSIS — M79604 Pain in right leg: Secondary | ICD-10-CM

## 2016-09-15 DIAGNOSIS — I6523 Occlusion and stenosis of bilateral carotid arteries: Secondary | ICD-10-CM

## 2016-09-15 DIAGNOSIS — I70261 Atherosclerosis of native arteries of extremities with gangrene, right leg: Secondary | ICD-10-CM

## 2016-09-15 HISTORY — PX: PERIPHERAL VASCULAR CATHETERIZATION: SHX172C

## 2016-09-15 LAB — BASIC METABOLIC PANEL
ANION GAP: 4 — AB (ref 5–15)
BUN: 18 mg/dL (ref 6–20)
CO2: 28 mmol/L (ref 22–32)
Calcium: 8.2 mg/dL — ABNORMAL LOW (ref 8.9–10.3)
Chloride: 110 mmol/L (ref 101–111)
Creatinine, Ser: 1.38 mg/dL — ABNORMAL HIGH (ref 0.61–1.24)
GFR calc Af Amer: 60 mL/min — ABNORMAL LOW (ref >60)
GFR, EST NON AFRICAN AMERICAN: 52 mL/min — AB (ref >60)
GLUCOSE: 149 mg/dL — AB (ref 65–99)
POTASSIUM: 3.5 mmol/L (ref 3.5–5.1)
SODIUM: 142 mmol/L (ref 135–145)

## 2016-09-15 LAB — GLUCOSE, CAPILLARY
GLUCOSE-CAPILLARY: 131 mg/dL — AB (ref 65–99)
GLUCOSE-CAPILLARY: 218 mg/dL — AB (ref 65–99)
GLUCOSE-CAPILLARY: 268 mg/dL — AB (ref 65–99)
Glucose-Capillary: 190 mg/dL — ABNORMAL HIGH (ref 65–99)
Glucose-Capillary: 150 mg/dL — ABNORMAL HIGH (ref 65–99)

## 2016-09-15 LAB — HEMOGLOBIN: HEMOGLOBIN: 11.2 g/dL — AB (ref 13.0–18.0)

## 2016-09-15 SURGERY — LOWER EXTREMITY ANGIOGRAPHY
Anesthesia: Moderate Sedation | Laterality: Right

## 2016-09-15 SURGERY — LOWER EXTREMITY ANGIOGRAPHY
Anesthesia: Moderate Sedation

## 2016-09-15 MED ORDER — SODIUM CHLORIDE 0.9 % IV SOLN: INTRAVENOUS | Status: DC

## 2016-09-15 MED ORDER — FENTANYL CITRATE (PF) 100 MCG/2ML IJ SOLN
INTRAMUSCULAR | Status: AC
  Filled 2016-09-15: qty 2

## 2016-09-15 MED ORDER — HEPARIN (PORCINE) IN NACL 2-0.9 UNIT/ML-% IJ SOLN
INTRAMUSCULAR | Status: AC
  Filled 2016-09-15: qty 1000

## 2016-09-15 MED ORDER — ASPIRIN EC 81 MG PO TBEC
81.0000 mg | DELAYED_RELEASE_TABLET | Freq: Every day | ORAL | Status: DC
  Administered 2016-09-15 – 2016-09-16 (×2): 81 mg via ORAL
  Filled 2016-09-15 (×2): qty 1

## 2016-09-15 MED ORDER — FENTANYL CITRATE (PF) 100 MCG/2ML IJ SOLN
INTRAMUSCULAR | Status: DC | PRN
  Administered 2016-09-15: 50 ug via INTRAVENOUS

## 2016-09-15 MED ORDER — AMLODIPINE BESYLATE 5 MG PO TABS
5.0000 mg | ORAL_TABLET | Freq: Every day | ORAL | Status: DC
  Administered 2016-09-15: 5 mg via ORAL
  Filled 2016-09-15 (×2): qty 1

## 2016-09-15 MED ORDER — MIDAZOLAM HCL 2 MG/2ML IJ SOLN
INTRAMUSCULAR | Status: DC | PRN
  Administered 2016-09-15: 2 mg via INTRAVENOUS

## 2016-09-15 MED ORDER — HEPARIN SODIUM (PORCINE) 1000 UNIT/ML IJ SOLN
INTRAMUSCULAR | Status: AC
  Filled 2016-09-15: qty 1

## 2016-09-15 MED ORDER — IOPAMIDOL (ISOVUE-300) INJECTION 61%
INTRAVENOUS | Status: DC | PRN
  Administered 2016-09-15: 45 mg via INTRAVENOUS

## 2016-09-15 MED ORDER — MIDAZOLAM HCL 5 MG/5ML IJ SOLN
INTRAMUSCULAR | Status: AC
  Filled 2016-09-15: qty 5

## 2016-09-15 MED ORDER — LIDOCAINE-EPINEPHRINE 1 %-1:100000 IJ SOLN
INTRAMUSCULAR | Status: AC
  Filled 2016-09-15: qty 1

## 2016-09-15 MED ORDER — CEFAZOLIN IN D5W 1 GM/50ML IV SOLN
1.0000 g | Freq: Once | INTRAVENOUS | Status: AC
  Administered 2016-09-15: 1 g via INTRAVENOUS

## 2016-09-15 SURGICAL SUPPLY — 8 items
CATH PIG 70CM (CATHETERS) ×3 IMPLANT
DEVICE STARCLOSE SE CLOSURE (Vascular Products) ×3 IMPLANT
GLIDEWIRE ADV .035X260CM (WIRE) ×3 IMPLANT
PACK ANGIOGRAPHY (CUSTOM PROCEDURE TRAY) ×3 IMPLANT
SHEATH BRITE TIP 5FRX11 (SHEATH) ×3 IMPLANT
SYR MEDRAD MARK V 150ML (SYRINGE) ×3 IMPLANT
TUBING CONTRAST HIGH PRESS 72 (TUBING) ×3 IMPLANT
WIRE J 3MM .035X145CM (WIRE) ×3 IMPLANT

## 2016-09-15 NOTE — Consult Note (Signed)
Patient Demographics  Rithy Mandley, is a 65 y.o. male   MRN: [id number]   DOB - January 08, 1951  Admit Date - 09/13/2016    Outpatient Primary MD for the patient is Tora Duck, MD  Consult requested in the Hospital by Theodoro Grist, MD, On 09/15/2016  With History of -  Past Medical History:  Diagnosis Date  . Blindness of left eye   . Chronic kidney disease, stage III (moderate)   . Diabetes ([place])   . Hypertension   . Syncope and collapse   . Thyroid dysfunction       Past Surgical History:  Procedure Laterality Date  . THYROID SURGERY      in for   Chief Complaint  Patient presents with  . Toe Pain     HPI  Samwise Eckardt  is a 65 y.o. male, Admitted to hospital with gangrenous changes to the second toe of the right foot. He underwent a catheterization on the right lower extremity today and Dr. dew felt like he had reasonable large vessel flow to the right foot. Posterior tibial vessel was occluded but the anterior tibial vessel and peroneals were patent to the foot. He does feel like there significant small vessel disease which is likely contributing to the gangrenous changes to the digit. Previous x-rays showed small vessel calcification.    Social History Social History  Substance Use Topics  . Smoking status: Former Smoker    Packs/day: 0.10    Years: 25.00    Types: Cigarettes    Quit date: 10/31/1978  . Smokeless tobacco: Never Used     Comment: smoked 2-3 cigarettes/day  . Alcohol use No     Family History Family History  Problem Relation Age of Onset  . Kidney failure Mother   . Heart disease Father   . Kidney failure Father   . Heart disease Brother     Prior to Admission medications   Medication Sig Start Date End Date Taking? Authorizing Provider  aspirin EC 81 MG  tablet Take 81 mg by mouth daily.   Yes Historical Provider, MD  atorvastatin (LIPITOR) 40 MG tablet Take 40 mg by mouth at bedtime.   Yes Historical Provider, MD  citalopram (CELEXA) 20 MG tablet Take 20 mg by mouth daily.   Yes Historical Provider, MD  fludrocortisone (FLORINEF) 0.1 MG tablet Take 0.1 mg by mouth 2 (two) times daily.   Yes Historical Provider, MD  hydrOXYzine (ATARAX/VISTARIL) 10 MG tablet Take 10 mg by mouth every 6 (six) hours as needed for itching.    Yes Historical Provider, MD  insulin aspart (NOVOLOG) 100 UNIT/ML injection Inject 5 Units into the skin 3 (three) times daily before meals.   Yes Historical Provider, MD  insulin detemir (LEVEMIR) 100 UNIT/ML injection Inject 28 Units into the skin at bedtime.   Yes Historical Provider, MD  levothyroxine (SYNTHROID, LEVOTHROID) 150 MCG tablet Take 150 mcg by mouth daily before breakfast.   Yes Historical Provider, MD  lipase/protease/amylase (CREON) 12000 units CPEP capsule Take 24,000 Units by mouth 3 (three) times daily with meals.    Yes Historical Provider, MD    Anti-infectives    Start  Dose/Rate Route Frequency Ordered Stop   09/15/16 1500  ceFAZolin (ANCEF) IVPB 1 g/50 mL premix     1 g 100 mL/hr over 30 Minutes Intravenous  Once 09/15/16 1448 09/15/16 1518   09/13/16 1700  Ampicillin-Sulbactam (UNASYN) 3 g in sodium chloride 0.9 % 100 mL IVPB     3 g 200 mL/hr over 30 Minutes Intravenous Every 6 hours 09/13/16 1606     09/13/16 1430  vancomycin (VANCOCIN) IVPB 1000 mg/200 mL premix     1,000 mg 200 mL/hr over 60 Minutes Intravenous  Once 09/13/16 1416 09/13/16 1536      Scheduled Meds: . amLODipine  5 mg Oral Daily  . ampicillin-sulbactam (UNASYN) IV  3 g Intravenous Q6H  . aspirin EC  81 mg Oral Daily  . atorvastatin  40 mg Oral QHS  . citalopram  20 mg Oral Daily  . enoxaparin (LOVENOX) injection  40 mg Subcutaneous Q24H  . Influenza vac split quadrivalent PF  0.5 mL Intramuscular Tomorrow-1000  .  insulin aspart  0-15 Units Subcutaneous TID WC  . insulin aspart  5 Units Subcutaneous TID AC  . insulin detemir  28 Units Subcutaneous QHS  . levothyroxine  150 mcg Oral QAC breakfast  . lipase/protease/amylase  24,000 Units Oral TID WC  . senna  1 tablet Oral BID   Continuous Infusions: PRN Meds:.acetaminophen **OR** acetaminophen, bisacodyl, docusate sodium, hydrOXYzine, traMADol  No Known Allergies  Physical Exam  Vitals  Blood pressure (!) 150/62, pulse 69, temperature 97.8 F (36.6 C), temperature source Oral, resp. rate 15, height 5\' 11"  (1.803 m), weight 78.5 kg (173 lb), SpO2 96 %.  Lower Extremity exam:Gangrenous changes second toe left foot no overt evidence of any spreading infection at this timeframe.  Data Review  CBC  Recent Labs Lab 09/13/16 1201 09/15/16 0351  WBC 7.3  --   HGB 11.5* 11.2*  HCT 34.7*  --   PLT 202  --   MCV 79.4*  --   MCH 26.4  --   MCHC 33.2  --   RDW 13.1  --   LYMPHSABS 1.4  --   MONOABS 0.6  --   EOSABS 0.2  --   BASOSABS 0.1  --    ------------------------------------------------------------------------------------------------------------------  Chemistries   Recent Labs Lab 09/13/16 1201 09/15/16 0351  NA 134* 142  K 3.0* 3.5  CL 99* 110  CO2 28 28  GLUCOSE 447* 149*  BUN 18 18  CREATININE 1.64* 1.38*  CALCIUM 8.4* 8.2*  AST 19  --   ALT 12*  --   ALKPHOS 136*  --   BILITOT 0.5  --    ------Imaging results:   US Carotid Bilateral  Result Date: 09/15/2016 CLINICAL DATA:  TIA. EXAM: BILATERAL CAROTID DUPLEX ULTRASOUND TECHNIQUE: Pearline Cables scale imaging, color Doppler and duplex ultrasound were performed of bilateral carotid and vertebral arteries in the neck. COMPARISON:  No prior. FINDINGS: Criteria: Quantification of carotid stenosis is based on velocity parameters that correlate the residual internal carotid diameter with NASCET-based stenosis levels, using the diameter of the distal internal carotid lumen as the  denominator for stenosis measurement. The following velocity measurements were obtained: RIGHT ICA:  119/11 cm/sec CCA:  997/7 cm/sec SYSTOLIC ICA/CCA RATIO:  1.0 DIASTOLIC ICA/CCA RATIO:  1.5 ECA:  154 cm/sec LEFT ICA:  97/9 cm/sec CCA:  414/2 cm/sec SYSTOLIC ICA/CCA RATIO:  0.8 DIASTOLIC ICA/CCA RATIO:  1.1 ECA:  151 cm/sec RIGHT CAROTID ARTERY: Mild right common carotid and proximal ICA atherosclerotic  vascular plaque. No flow limiting stenosis. RIGHT VERTEBRAL ARTERY:  Patent with antegrade flow. LEFT CAROTID ARTERY: Mild left carotid bifurcation atherosclerotic vascular plaque. No flow limiting stenosis. LEFT VERTEBRAL ARTERY:  Patent with antegrade flow. IMPRESSION: 1. Mild right common carotid and proximal ICA atherosclerotic vascular disease. No flow limiting stenosis. Degree of stenosis less than 50%. 2. Mild left proximal ICA atherosclerotic vascular disease. No flow limiting stenosis. Degree of stenosis less than 50%. 3.  Vertebral arteries are patent with antegrade flow. 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 EXAM: BILATERAL LOWER EXTREMITY ARTERIAL DUPLEX SCAN TECHNIQUE: Gray-scale sonography as well as color Doppler and duplex ultrasound was performed to evaluate the arteries of both lower extremities. COMPARISON:  None. FINDINGS: Right lower extremity: The common femoral, superficial femoral, profunda femoral and popliteal arteries demonstrate normal velocities and triphasic Doppler waveforms. Mild amount of calcified plaque is present at the level of the profunda femoral origin. There is a minimal amount of plaque scattered in the SFA and popliteal arteries. Below the knee, the anterior tibial artery demonstrates triphasic Doppler waveform and normal velocity. The proximal posterior tibial artery demonstrates some mild plaque and monophasic waveform.  Left lower extremity: The common femoral, superficial femoral, profunda femoral and popliteal arteries demonstrate normal triphasic Doppler waveforms. There is a mild amount of calcified plaque present in the left SFA and profunda femoral arteries. Below the knee, anterior tibial and posterior tibial arteries demonstrate normal triphasic waveforms and normal velocities. IMPRESSION: No significant proximal arterial occlusive disease identified in either lower extremity. There likely is some degree of tibial disease present in the distribution of the posterior tibial artery on the right. Distal tibial arteries and pedal vessels were not interrogated. Electronically Signed   By: Aletta Edouard M.D.   On: 09/15/2016 12:25     Assessment & Plan: Patient has gangrene and will eventually need this toe amputated. He is somewhat hesitant to do that at this point. His flow cannot be improved upon due to the microvascular changes that he has calcification in the asthma small vessels to the right lower extremity. Vascular felt like he had a reasonable chance of healing with an amputation to the MTP joint level. He will need amputation at that site. Also there is some color changes to the third toe but does not appear to be significantly cyanotic or necrotic at this timeframe. I explained once again and today the gangrenous changes that he has called his daughter and also explained to her the significant problems that are going on with that foot. She has a good understanding of it and she wants her father to go ahead and have amputation of the digit. He is hesitant at this timeframe. I explained that if he does want to do at this timeframe that's his choice that he could consider doing a lighter but it will need to be done eventually. Preferably before he develops any infection.  Active Problems:   Gangrene [facility])  Family Communication: Plan discussed with patient and family   Perry Mount M.D on 09/15/2016 at  5:31 PM

## 2016-09-15 NOTE — Progress Notes (Signed)
Patient here today for lower extremity angiogram with possible intervention, vss , alert and oriented. Will cont to monitor vitals during and throughout entire procedure.

## 2016-09-15 NOTE — Progress Notes (Signed)
[facility] NAME: Cristhian Vanhook    MR#:  [id number]  DATE OF BIRTH:  August 23, 1951  SUBJECTIVE:  CHIEF COMPLAINT:   Chief Complaint  Patient presents with  . Toe Pain  The patient is a 65 year old African American male with past medical history significant for history of diabetes, CK D stage III, hypothyroidism, who presents to the hospital with complaints of swelling and pain in right foot, right second toe dark discoloration. On arrival to the hospital. It was felt to have right second toe gangrene and was admitted. He was seen by podiatrist, who recommended vascular evaluation due to poor peripheral pulses. X-ray of right foot revealed minimal ear and subcutaneous tissues at second distal phalanx compatible with open draining wound, per radiologist. Podiatrist did not feel that patient had progressing cellulitis or infection. The patient is initiated on Unasyn.  Today, patient feels comfortable, however, he is not sure if he would like to have his toe amputated, he is requesting second opinion, which was discussed with Dr. Elvina Mattes. Dr. Lucky Cowboy saw the patient and patient underwent angiogram, revealing posterior tibialis occlusion above the ankle with no reconstitution.    Review of Systems  Constitutional: Negative for chills, fever and weight loss.  HENT: Negative for congestion.   Eyes: Negative for blurred vision and double vision.  Respiratory: Negative for cough, sputum production, shortness of breath and wheezing.   Cardiovascular: Negative for chest pain, palpitations, orthopnea, leg swelling and PND.  Gastrointestinal: Negative for abdominal pain, blood in stool, constipation, diarrhea, nausea and vomiting.  Genitourinary: Negative for dysuria, frequency, hematuria and urgency.  Musculoskeletal: Negative for falls.  Neurological: Negative for dizziness, tremors, focal weakness and headaches.  Endo/Heme/Allergies: Does not  bruise/bleed easily.  Psychiatric/Behavioral: Negative for depression. The patient does not have insomnia.     VITAL SIGNS: Blood pressure (!) 150/62, pulse 69, temperature 97.8 F (36.6 C), temperature source Oral, resp. rate 15, height 5\' 11"  (1.803 m), weight 78.5 kg (173 lb), SpO2 96 %.  PHYSICAL EXAMINATION:   GENERAL:  65 y.o.-year-old patient lying in the bed with no acute distress.  EYES: Pupils equal, round, reactive to light and accommodation. No scleral icterus. Extraocular muscles intact.  HEENT: Head atraumatic, normocephalic. Oropharynx and nasopharynx clear.  NECK:  Supple, no jugular venous distention. No thyroid enlargement, no tenderness.  LUNGS: Normal breath sounds bilaterally, no wheezing, rales,rhonchi or crepitation. No use of accessory muscles of respiration.  CARDIOVASCULAR: S1, S2 normal. No murmurs, rubs, or gallops.  ABDOMEN: Soft, nontender, nondistended. Bowel sounds present. No organomegaly or mass.  EXTREMITIES: Right foot, pedal edema, right second toe distal aspect gangrene, some cyanosis , but no clubbing. Erythema or dorsum of right foot. Dorsalis pedis pulses are palpable bilaterally NEUROLOGIC: Cranial nerves II through XII are intact. Muscle strength 5/5 in all extremities. Sensation decreased to light touch bilaterally in distal lower extremities. Gait not checked.  PSYCHIATRIC: The patient is alert and oriented x 3.  SKIN: No obvious rash, lesion, or ulcer.   ORDERS/RESULTS REVIEWED:   CBC  Recent Labs Lab 09/13/16 1201 09/15/16 0351  WBC 7.3  --   HGB 11.5* 11.2*  HCT 34.7*  --   PLT 202  --   MCV 79.4*  --   MCH 26.4  --   MCHC 33.2  --   RDW 13.1  --   LYMPHSABS 1.4  --   MONOABS 0.6  --   EOSABS 0.2  --  BASOSABS 0.1  --    ------------------------------------------------------------------------------------------------------------------  Chemistries   Recent Labs Lab 09/13/16 1201 09/15/16 0351  NA 134* 142  K 3.0* 3.5   CL 99* 110  CO2 28 28  GLUCOSE 447* 149*  BUN 18 18  CREATININE 1.64* 1.38*  CALCIUM 8.4* 8.2*  AST 19  --   ALT 12*  --   ALKPHOS 136*  --   BILITOT 0.5  --    ------------------------------------------------------------------------------------------------------------------ estimated creatinine clearance is 56.1 mL/min (by C-G formula based on SCr of 1.38 mg/dL (H)). ------------------------------------------------------------------------------------------------------------------ No results for input(s): TSH, T4TOTAL, T3FREE, THYROIDAB in the last 72 hours.  Invalid input(s): FREET3  Cardiac Enzymes No results for input(s): CKMB, TROPONINI, MYOGLOBIN in the last 168 hours.  Invalid input(s): CK ------------------------------------------------------------------------------------------------------------------ Invalid input(s): POCBNP ---------------------------------------------------------------------------------------------------------------  RADIOLOGY: US Carotid Bilateral  Result Date: 09/15/2016 CLINICAL DATA:  TIA. EXAM: BILATERAL CAROTID DUPLEX ULTRASOUND TECHNIQUE: Pearline Cables scale imaging, color Doppler and duplex ultrasound were performed of bilateral carotid and vertebral arteries in the neck. COMPARISON:  No prior. FINDINGS: Criteria: Quantification of carotid stenosis is based on velocity parameters that correlate the residual internal carotid diameter with NASCET-based stenosis levels, using the diameter of the distal internal carotid lumen as the denominator for stenosis measurement. The following velocity measurements were obtained: RIGHT ICA:  119/11 cm/sec CCA:  097/3 cm/sec SYSTOLIC ICA/CCA RATIO:  1.0 DIASTOLIC ICA/CCA RATIO:  1.5 ECA:  154 cm/sec LEFT ICA:  97/9 cm/sec CCA:  532/9 cm/sec SYSTOLIC ICA/CCA RATIO:  0.8 DIASTOLIC ICA/CCA RATIO:  1.1 ECA:  151 cm/sec RIGHT CAROTID ARTERY: Mild right common carotid and proximal ICA atherosclerotic vascular plaque. No flow  limiting stenosis. RIGHT VERTEBRAL ARTERY:  Patent with antegrade flow. LEFT CAROTID ARTERY: Mild left carotid bifurcation atherosclerotic vascular plaque. No flow limiting stenosis. LEFT VERTEBRAL ARTERY:  Patent with antegrade flow. IMPRESSION: 1. Mild right common carotid and proximal ICA atherosclerotic vascular disease. No flow limiting stenosis. Degree of stenosis less than 50%. 2. Mild left proximal ICA atherosclerotic vascular disease. No flow limiting stenosis. Degree of stenosis less than 50%. 3.  Vertebral arteries are patent with antegrade flow. 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 EXAM: BILATERAL LOWER EXTREMITY ARTERIAL DUPLEX SCAN TECHNIQUE: Gray-scale sonography as well as color Doppler and duplex ultrasound was performed to evaluate the arteries of both lower extremities. COMPARISON:  None. FINDINGS: Right lower extremity: The common femoral, superficial femoral, profunda femoral and popliteal arteries demonstrate normal velocities and triphasic Doppler waveforms. Mild amount of calcified plaque is present at the level of the profunda femoral origin. There is a minimal amount of plaque scattered in the SFA and popliteal arteries. Below the knee, the anterior tibial artery demonstrates triphasic Doppler waveform and normal velocity. The proximal posterior tibial artery demonstrates some mild plaque and monophasic waveform. Left lower extremity: The common femoral, superficial femoral, profunda femoral and popliteal arteries demonstrate normal triphasic Doppler waveforms. There is a mild amount of calcified plaque present in the left SFA and profunda femoral arteries. Below the knee, anterior tibial and posterior tibial arteries demonstrate normal triphasic waveforms and normal velocities. IMPRESSION: No significant proximal arterial occlusive disease  identified in either lower extremity. There likely is some degree of tibial disease present in the distribution of the posterior tibial artery on the right. Distal tibial arteries and pedal vessels were not interrogated.  Electronically Signed   By: Aletta Edouard M.D.   On: 09/15/2016 12:25    EKG:  Orders placed or performed during the hospital encounter of 10/19/15  . ED EKG  . ED EKG  . EKG 12-Lead  . EKG 12-Lead    ASSESSMENT AND PLAN:  Active Problems:   Gangrene ([place])  #1. Right second toe gangrene, Status post angiogram, continue Unasyn, blood cultures are negative so far, podiatrist is planning amputation of the second toe, extent of amputation is unclear at this time. #2. peripheral vascular disease, status post angiogram 09/15/2016 by Dr. dew, posterior tibialis artery occlusion was noted above the ankle with no reconstitution distally, anterior tibialis is patent #3. Chronic renal insufficiency, CK D stage III, follow after the angiogram #4. Hyponatremia, likely due to intravascular depletion, resolved, follow  sodium level in the morning #5. Hypokalemia, supplemented orally, resolved #6. Diabetes mellitus with significant hyperglycemia and admission, continue Levemir, sliding scale insulin, diabetic diet, awaiting for hemoglobin A1c #7. Essential hypertension, significant blood pressure elevation of his IV fluid administration, now off IV fluids, initiate Norvasc, advanced medications as needed   Management plans discussed with the patient, family and they are in agreement.   DRUG ALLERGIES: No Known Allergies  CODE STATUS:     Code Status Orders        Start     Ordered   09/13/16 1756  Full code  Continuous     09/13/16 1756    Code Status History    Date Active Date Inactive Code Status Order ID Comments User Context   This patient has a current code status but no historical code status.      TOTAL TIME TAKING CARE OF THIS PATIENT: 40 minutes.   Discussed with Dr. Elvina Mattes for prolonged discussion this patient himself and his family   Kortlynn Poust M.D on 09/15/2016 at 4:16 PM  Between 7am to 6pm - Pager - [phone]  After 6pm go to [email or web address] - password [email or web address] [facility]  Office  [phone]  CC: Primary care physician; Tora Duck, MD

## 2016-09-15 NOTE — Progress Notes (Signed)
Procedure done, pt stable throughout , vitals stable.will transfer to recovery specials to further monitor prior to transfer back to room

## 2016-09-15 NOTE — Op Note (Addendum)
[facility] Percutaneous Study/Intervention Procedural Note   Date of Surgery: 09/15/2016  Surgeon(s):Ezma Rehm   Assistants:none  Pre-operative Diagnosis: PAD with gangrene right foot  Post-operative diagnosis: Same  Procedure(s) Performed: 1. Ultrasound guidance for vascular access left femoral artery 2. Catheter placement into right superficial femoral artery from left femoral approach 3. Aortogram and selective right lower extremity angiogram 4.  StarClose closure device left femoral artery  EBL: Minimal  Contrast: 45 cc  Fluoro Time: 1.0 minute  Moderate Conscious Sedation Time: approximately 20 minutes using 2 mg of Versed and 50 mcg of Fentanyl  Indications: Patient is a 65 y.o.male with a gangrenous right second toe and a marginal right third toe. The patient has noninvasive study showing no proximal stenosis but calcification was present and tibial vessel disease cannot be ruled out. ABIs are not available at our institution to evaluate digital pressures and pulse volume recordings. The patient is brought in for angiography for further evaluation and potential treatment. Risks and benefits are discussed and informed consent is obtained  Procedure: The patient was identified and appropriate procedural time out was performed. The patient was then placed supine on the table and prepped and draped in the usual sterile fashion.Moderate conscious sedation was administered during a face to face encounter with the patient throughout the procedure with my supervision of the RN administering medicines and monitoring the patient's vital signs, pulse oximetry, telemetry and mental status throughout from the start of the procedure until the patient was taken to the recovery room. Ultrasound was used to evaluate the left common femoral artery. It was patent . A digital ultrasound image was  acquired. A Seldinger needle was used to access the left common femoral artery under direct ultrasound guidance and a permanent image was performed. A 0.035 J wire was advanced without resistance and a 5Fr sheath was placed. Pigtail catheter was placed into the aorta and an AP aortogram was performed. This demonstrated normal renal arteries and normal aorta and iliac segments without significant stenosis. I then crossed the aortic bifurcation and advanced to the right femoral head. Selective right lower extremity angiogram was then performed. For tibial imaging, I advanced a pigtail catheter into the mid superficial femoral artery over an advantage wire without difficulty to get better opacification of the tibial vessels. This demonstrated normal common femoral artery, profunda femoris artery, superficial femoral artery, and popliteal artery. Normal tibial trifurcation. The peroneal artery was continuous in its normal course to the ankle but was small. The anterior tibial artery was the best runoff to the foot and was continuous into the foot. There was some degree of pruning of the small vessels in the foot with significant small vessel disease present. The posterior tibial artery occluded just above the ankle with no distal reconstitution and no significant stenosis proximal to its occlusion. It appeared he had reasonably good flow into the foot with only small and microvascular disease present that would not be amenable to any endovascular revascularization and his potential of healing a toe amputation was reasonably good. I elected to terminate the procedure. The sheath was removed and StarClose closure device was deployed in the left femoral artery with excellent hemostatic result. The patient was taken to the recovery room in stable condition having tolerated the procedure well.  Findings:  Aortogram: Normal renal arteries, normal aorta, and normal iliac arteries without significant  stenosis Right Lower Extremity: Normal common femoral artery, profunda femoris artery, superficial femoral artery, and popliteal artery. Normal tibial trifurcation.  The peroneal artery was continuous in its normal course to the ankle but was small. The anterior tibial artery was the best runoff to the foot and was continuous into the foot. There was some degree of pruning of the small vessels in the foot with significant small vessel disease present. The posterior tibial artery occluded just above the ankle with no distal reconstitution and no significant stenosis proximal to its occlusion.   Disposition: Patient was taken to the recovery room in stable condition having tolerated the procedure well.  Complications: None  Cameron Adams 09/15/2016 2:59 PM   This note was created with Dragon Medical transcription system. Any errors in dictation are purely unintentional.

## 2016-09-15 NOTE — Progress Notes (Signed)
[facility]  Daily Progress Note   Subjective  - * No surgery date entered *  Patient seen during his duplex ultrasound today. Still having problems with his right foot and also ward by the jerking in his legs. He has significant neuropathy. Noninvasive studies show less than 50% carotid artery stenosis bilaterally. Arterial duplex being performed while I examined the patient and there is clearly some atherosclerotic plaque, but no high-grade proximal stenosis is identified on duplex. Final report pending.  Objective Vitals:   09/14/16 1332 09/14/16 1936 09/15/16 0436 09/15/16 0816  BP: (!) 169/67 (!) 116/48 121/65 (!) 176/67  Pulse: 69 76 66 71  Resp: 18 18 18 17   Temp: 98.1 F (36.7 C) 98.1 F (36.7 C) 98.1 F (36.7 C) 98.4 F (36.9 C)  TempSrc: Oral Oral Oral Oral  SpO2: 99% 100% 99% 100%  Weight:      Height:        Intake/Output Summary (Last 24 hours) at 09/15/16 1143 Last data filed at 09/15/16 1017  Gross per 24 hour  Intake          2652.08 ml  Output                0 ml  Net          2652.08 ml    PULM  CTAB CV  RRR   Laboratory CBC    Component Value Date/Time   WBC 7.3 09/13/2016 1201   HGB 11.2 (L) 09/15/2016 0351   HGB 10.3 (L) 10/14/2013 0435   HCT 34.7 (L) 09/13/2016 1201   HCT 31.1 (L) 10/14/2013 0435   PLT 202 09/13/2016 1201   PLT 197 10/14/2013 0435    BMET    Component Value Date/Time   NA 142 09/15/2016 0351   NA 138 10/14/2013 0435   K 3.5 09/15/2016 0351   K 3.9 10/14/2013 0435   CL 110 09/15/2016 0351   CL 106 10/14/2013 0435   CO2 28 09/15/2016 0351   CO2 27 10/14/2013 0435   GLUCOSE 149 (H) 09/15/2016 0351   GLUCOSE 104 (H) 10/14/2013 0435   BUN 18 09/15/2016 0351   BUN 11 10/14/2013 0435   CREATININE 1.38 (H) 09/15/2016 0351   CREATININE 1.52 (H) 10/16/2013 0517   CALCIUM 8.2 (L) 09/15/2016 0351   CALCIUM 8.1 (L) 10/14/2013 0435   GFRNONAA 52 (L) 09/15/2016 0351   GFRNONAA 48 (L) 10/16/2013 0517    GFRAA 60 (L) 09/15/2016 0351   GFRAA 56 (L) 10/16/2013 0517    Assessment/Planning: Gangrene right foot   I discussed the case with Dr. Elvina Mattes as well as with the patient in some detail. Although there is no proximal stenosis seen on duplex today, he does have a gangrenous toe and there is concern over the third toe on the right foot as well. He likely has small vessel disease, but he may have some tibial disease that would be amenable to treatment and an angiogram is reasonable for this reason. Discussed with the patient that there is a reasonable chance that the angiogram will be diagnostic only, but he is willing to have any procedure done that would avoid further amputation. Discussed the risks and benefits of the procedure in detail and the patient desires to proceed. In discussions with Dr. Elvina Mattes, angiogram was also felt to be reasonable to try to preserve as much tissue as possible if any lesion amenable to revascularization is found. This has been scheduled for today  Mild carotid stenosis bilaterally was likely not the cause of his previous TIA. Should be checked every one to 2 years with follow-up duplex. This can be done as an outpatient and I would be happy to do this.    Leotis Pain  09/15/2016, 11:43 AM

## 2016-09-15 NOTE — Evaluation (Signed)
Physical Therapy Evaluation Patient Details Name: Cameron Cameron Adams MRN: [id number] DOB: March 09, 1951 Today's Date: 09/15/2016   History of Present Illness  Cameron Cameron Adams  is a 65 y.o. 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 Patient has had history of diabetic foot ulcer in the past comes in with darkening/discoloration of his right second toe. Denies any pain since he has significant neuropathy. In the ER patient received 1 dose of IV vancomycin. His white count is normal. His x-ray of the foot shows minimal air around the second toe. 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 Currently MD is considering revascularization procedure and DPM considering possible toe amputation  Clinical Impression  Pt admitted with above diagnosis. Pt currently with functional limitations due to the deficits listed below (see PT Problem List).  Pt demonstrates good mobility but does requires CGA during ambulation due to some mild unsteadiness with gait. Ambulation is antalgic on RLE with most of his weight through his right heel. He presents with some balance deficits with feet together and in single leg stance. Pt reports 5 falls in the last 12 months but relates them to syncopal episodes due to hypotension. He would benefit from use of single point cane at this time for added stability with ambulation. Will continue to follow patient as medical team is still trying to determine his plan of care with respect to revascularization/amputation. If patient undergoes surgical procedure under general anesthesia or amputation will need continuation or new orders as well as updated WB status. He would benefit from OP PT to work on balance and strength but unclear if patient would agree to this. Major discharge  barrier is 13 steps to get into his second floor apartment. Pt will benefit from skilled PT services to address deficits in strength, balance, and mobility in order to return to full function at home.     Follow Up Recommendations Outpatient PT;Other (comment) (Unclear if pt would agree)    Equipment Recommendations  None recommended by PT  Utilize single point cane vs RW at discharge   Recommendations for Other Services       Precautions / Restrictions Precautions Precautions: None Restrictions Weight Bearing Restrictions: No      Mobility  Bed Mobility Overal bed mobility: Independent             General bed mobility comments: Good speed and sequencing noted during bed mobility  Transfers Overall transfer level: Modified independent Equipment used: None             General transfer comment: Pt with Cameron Adams hand placement during transfer but does appear to rely on UE support to perform. Fair stability noted in standing without UE support  Ambulation/Gait Ambulation/Gait assistance: Min guard Ambulation Distance (Feet): 200 Feet Assistive device: None Gait Pattern/deviations: Antalgic Gait velocity: Decreased but functional for limited community mobility Gait velocity interpretation: <1.8 ft/sec, indicative of risk for recurrent falls General Gait Details: Pt ambulates with antalgic gait pattern on RLE. Decreased stance time on RLE and wide BOS. Pt appears mildly unsteady but no overt LOB. Able to perform head turns with only minimal lateral gait deviations. VSS and pt denies DOE during ambulation. Pt mostly ambulates on right heel  Stairs            Wheelchair Mobility  Modified Rankin (Stroke Patients Only)       Balance Overall balance assessment: Needs assistance Sitting-balance support: No upper extremity supported Sitting balance-Leahy Scale: Good     Standing balance support: No upper extremity supported Standing balance-Leahy Scale:  Fair Standing balance comment: Pt with some increased trunk sway with feet together stance. Positive Rhomberg and decreased single leg stance balance falling backwards onto bed during attempt.                             Pertinent Vitals/Pain Pain Assessment: 0-10 Pain Score: 9  Pain Location: Pt reports pain from R hip extending down to R foot. Medical staff aware of RLE pain Pain Intervention(s): Monitored during session    Fidelis expects to be discharged to:: Private residence Living Arrangements: Alone Available Help at Discharge: Other (Comment) (Pt very vague regarding family situation) Type of Home: Apartment Home Access: Stairs to enter Entrance Stairs-Rails: Left Entrance Stairs-Number of Steps: 13 Home Layout: One level Home Equipment: Walker - 2 wheels;Grab bars - tub/shower      Prior Function Level of Independence: Independent         Comments: Pt reports independent community ambulation without assistive device. Reports independence with ADLs/IADLs but also states that he has help with transportation because he doesn't drive currently. Pt reports 5 falls in the last 12 months due to hypotension     Hand Dominance   Dominant Hand: Right    Extremity/Trunk Assessment   Upper Extremity Assessment: Overall WFL for tasks assessed           Lower Extremity Assessment: Overall WFL for tasks assessed (No focal deficits identified)         Communication   Communication: No difficulties  Cognition Arousal/Alertness: Awake/alert Behavior During Therapy: WFL for tasks assessed/performed Overall Cognitive Status: Within Functional Limits for tasks assessed                      General Comments      Exercises     Assessment/Plan    PT Assessment Patient needs continued PT services  PT Problem List Decreased strength;Decreased balance;Pain;Decreased knowledge of use of DME          PT Treatment Interventions  DME instruction;Gait training;Stair training;Balance training;Patient/family education    PT Goals (Current goals can be found in the Care Plan section)  Acute Rehab PT Goals Patient Stated Goal: Return to prior level of function at home PT Goal Formulation: With patient Time For Goal Achievement: 09/29/16 Potential to Achieve Goals: Fair    Frequency Min 2X/week   Barriers to discharge Decreased caregiver support;Inaccessible home environment Pt lives alone, has 13 steps to get to his second floor apartment    Co-evaluation               End of Session Equipment Utilized During Treatment: Gait belt Activity Tolerance: Patient tolerated treatment well Patient left: in bed;with call bell/phone within reach Nurse Communication: Mobility status         Time: 9326-7124 PT Time Calculation (min) (ACUTE ONLY): 14 min   Charges:   PT Evaluation $PT Eval Low Complexity: 1 Procedure     PT G Codes:       Cameron Cameron Adams Cameron Cameron Adams PT, DPT   Cameron Cameron Adams 09/15/2016, 11:32 AM

## 2016-09-15 NOTE — Progress Notes (Signed)
Pt clinically stable post lower extremity angiogram,diagnostic. Vitals stable. Left groin without bleeding nor hematoma, sr per monitor. Report called to care nurse on ortho- 253. With plan reviewed. Dr Lucky Cowboy out to speak with patient with questions aswered.

## 2016-09-16 ENCOUNTER — Encounter: Payer: Self-pay | Admitting: Anesthesiology

## 2016-09-16 DIAGNOSIS — I70261 Atherosclerosis of native arteries of extremities with gangrene, right leg: Secondary | ICD-10-CM

## 2016-09-16 DIAGNOSIS — I10 Essential (primary) hypertension: Secondary | ICD-10-CM

## 2016-09-16 DIAGNOSIS — I96 Gangrene, not elsewhere classified: Secondary | ICD-10-CM

## 2016-09-16 DIAGNOSIS — R9389 Abnormal findings on diagnostic imaging of other specified body structures: Secondary | ICD-10-CM

## 2016-09-16 DIAGNOSIS — E1165 Type 2 diabetes mellitus with hyperglycemia: Secondary | ICD-10-CM

## 2016-09-16 DIAGNOSIS — E876 Hypokalemia: Secondary | ICD-10-CM

## 2016-09-16 DIAGNOSIS — IMO0002 Reserved for concepts with insufficient information to code with codable children: Secondary | ICD-10-CM

## 2016-09-16 DIAGNOSIS — E871 Hypo-osmolality and hyponatremia: Secondary | ICD-10-CM

## 2016-09-16 DIAGNOSIS — I739 Peripheral vascular disease, unspecified: Secondary | ICD-10-CM

## 2016-09-16 LAB — HEMOGLOBIN A1C
HEMOGLOBIN A1C: 14.6 % — AB (ref 4.8–5.6)
MEAN PLASMA GLUCOSE: 372 mg/dL

## 2016-09-16 LAB — BASIC METABOLIC PANEL
Anion gap: 5 (ref 5–15)
BUN: 19 mg/dL (ref 6–20)
CALCIUM: 8.5 mg/dL — AB (ref 8.9–10.3)
CO2: 28 mmol/L (ref 22–32)
CREATININE: 1.46 mg/dL — AB (ref 0.61–1.24)
Chloride: 105 mmol/L (ref 101–111)
GFR calc non Af Amer: 48 mL/min — ABNORMAL LOW (ref >60)
GFR, EST AFRICAN AMERICAN: 56 mL/min — AB (ref >60)
Glucose, Bld: 128 mg/dL — ABNORMAL HIGH (ref 65–99)
Potassium: 3.7 mmol/L (ref 3.5–5.1)
SODIUM: 138 mmol/L (ref 135–145)

## 2016-09-16 LAB — GLUCOSE, CAPILLARY
GLUCOSE-CAPILLARY: 127 mg/dL — AB (ref 65–99)
Glucose-Capillary: 123 mg/dL — ABNORMAL HIGH (ref 65–99)

## 2016-09-16 MED ORDER — AMLODIPINE BESYLATE 10 MG PO TABS: 10.0000 mg | ORAL_TABLET | Freq: Every day | ORAL | 6 refills | Status: DC

## 2016-09-16 MED ORDER — AMOXICILLIN-POT CLAVULANATE 875-125 MG PO TABS: 1.0000 | ORAL_TABLET | Freq: Two times a day (BID) | ORAL | 0 refills | Status: DC

## 2016-09-16 NOTE — Care Management (Addendum)
No RNCM or home health follow up needed. PT recommending OP PT. Patient drives. Lives alone. Independent.

## 2016-09-16 NOTE — Progress Notes (Signed)
DR. Elvina Mattes called from OR- requested STAT anesthesia consult while patient still in hospital. Anesthesia ( Dr. Marcello Moores called and made aware of consult request. States that they will see patient. Patient made aware that anesthesia will be coming to see patient prior to D/C as well.

## 2016-09-16 NOTE — Progress Notes (Signed)
Inpatient Diabetes Program Recommendations  AACE/ADA: New Consensus Statement on Inpatient Glycemic Control (2015)  Target Ranges:  Prepandial:   less than 140 mg/dL      Peak postprandial:   less than 180 mg/dL (1-2 hours)      Critically ill patients:  140 - 180 mg/dL   Results for PARRIS, CUDWORTH (MRN [id number]) as of 09/16/2016 14:11  Ref. Range 09/15/2016 04:34 09/15/2016 08:14 09/15/2016 11:24 09/15/2016 15:59 09/15/2016 20:50 09/16/2016 07:28 09/16/2016 11:25  Glucose-Capillary Latest Ref Range: 65 - 99 mg/dL 131 (H) 218 (H) 190 (H) 150 (H) 268 (H) 123 (H) 127 (H)   Review of Glycemic Control  Diabetes history: DM2 Outpatient Diabetes medications: Levemir 28 units QHS, Novolog 5 units TID with meals Current orders for Inpatient glycemic control: Levemir 28 units QHS, Novolog 5 units TID with meals, Novolog 0-15 units TID with meals, Novolog 0-5 units QHS  Inpatient Diabetes Program Recommendations: HgbA1C: A1C 14.6% on 09/15/16 indicating and average glucose of 372 mg/dl over the past 2-3 months.  NOTE: Spoke with patient about diabetes and home regimen for diabetes control. Patient reports that he is followed by PCP for diabetes management and currently he takes Levemir 28 units QHS and Novolog 5 units TID with meals as an outpatient for diabetes control. Patient states that he is legally blind and currently has the 1cc insulin syringes that hold 100 units of insulins and due to his poor eyesight patient is not sure he is taking the correct insulin dosages.  Patient states that he is not able to see well enough to check his own glucose and can only check it when someone comes over to visit. Patient reports that the last time it was checked a few days ago it was 127 mg/dl.  Discussed A1C results (14.6.0% on 09/15/16) and explained that his current A1C indicates an average glucose of 372 mg/dl over the past 2-3 months. Discussed glucose and A1C goals. Discussed importance of checking  CBGs and maintaining good CBG control to prevent long-term and short-term complications. Explained how hyperglycemia leads to damage within blood vessels which lead to the common complications seen with uncontrolled diabetes. Stressed to the patient the importance of improving glycemic control to prevent further complications from uncontrolled diabetes. Discussed impact of nutrition, exercise, stress, sickness, and medications on diabetes control. Patient states that he would like to have the 1/3 cc insulin syringe which only holds 30 units of insulin to use at home. Patient reports that he could see those a little better and he would not be afraid he was taking too much insulin since it only holds 30 units. Inquired about insulin pens since each click is 1 unit of insulin and patient reports that he "tried the insulin pens in the past and they don't work for me."  Informed patient a request would be made for MD to provide him with a prescription for the 1/3 cc insulin syringe at time of discharge. Patient states that he is not sure he can afford to get the prescription filled but he will take it to the pharmacy and see if he can afford to get them. Encouraged patient to see if could get assistance with monitoring his glucose more often which will help MD to make adjustments with insulins if needed. Patient verbalized understanding of information discussed and he states that he has no further questions at this time related to diabetes.  Thanks, Barnie Alderman, RN, MSN, CDE Diabetes Coordinator Inpatient Diabetes Program [phone] (Team Pager)

## 2016-09-16 NOTE — Progress Notes (Signed)
Patient was discharged home. Reviewed discharge meds, scripts, and last dose given.  IV removed with cath intact. Patient understands that he has to follow-up tomorrow.  Talked with daughter about patient leaving, no concerns expressed.

## 2016-09-16 NOTE — Discharge Summary (Signed)
[facility] [place] NAME: Cameron Adams    MR#:  [id number]  DATE OF BIRTH:  [birth year]  DATE OF ADMISSION:  09/13/2016 ADMITTING PHYSICIAN: Fritzi Mandes, MD  DATE OF DISCHARGE: 09/16/2016  3:30 PM  PRIMARY CARE PHYSICIAN: Tora Duck, MD     ADMISSION DIAGNOSIS:  Cellulitis of right foot [L03.115] Dry gangrene ([place]) [I96]  DISCHARGE DIAGNOSIS:  Active Problems:   Gangrene ([place])   Toe gangrene ([facility])   PVD (peripheral vascular disease) ([place])   Uncontrolled diabetes mellitus ([place])   Essential hypertension, malignant   Abnormal angiogram   Hyponatremia   Hypokalemia   SECONDARY DIAGNOSIS:   Past Medical History:  Diagnosis Date  . Blindness of left eye   . Chronic kidney disease, stage III (moderate)   . Diabetes ([place])   . Hypertension   . Syncope and collapse   . Thyroid dysfunction     .pro HOSPITAL COURSE:  The patient is a 65 year old African American male with past medical history significant for history of diabetes, CKD stage III, hypothyroidism, who presents to the hospital with complaints of swelling and pain in right foot, right second toe dark discoloration. On arrival to the hospital he was felt to have right second toe gangrene and was admitted. He was seen by podiatrist, who recommended vascular evaluation due to poor peripheral pulses. X-ray of right foot revealed minimal air in subcutaneous tissues at second distal phalanx compatible with open draining wound, per radiologist. Podiatrist did not feel that patient had progressing cellulitis or infection. The patient was initiated on Unasyn.  The patient was seen by vascular surgeon, Dr. Lucky Cowboy  and patient underwent angiogram, revealing posterior tibialis occlusion above the ankle with no reconstitution distally, consistent with small vessel disease. Patient was somewhat reluctant to undergo operation, he requested to have that operation done on Friday, in about 4  days from now, this was discussed with Dr. Elvina Mattes, who recommended patient to be discharged home with follow-up with him tomorrow, 09/17/2016 for further discussions about operation. She was advised to continue Augmentin for infection prophylaxis. While in the hospital patient was noted to have significant hypertension requiring Norvasc administration. Discussion by problem: #1. Right second toe gangrene, status post angiogram 09/15/2016. Small vessel disease noted, not amenable for revascularization, continue Augmentin, blood cultures were negative, podiatrist was planning amputation of the second toe, patient would like to have that operation done on Friday, discussed with Dr. Elvina Mattes who recommended patient to follow-up with him as outpatient tomorrow, 09/17/2016 and he's office to further discuss timing of operation. #2. peripheral vascular disease, status post angiogram 09/15/2016 by Dr. Lucky Cowboy, posterior tibialis artery occlusion was noted above the ankle with no reconstitution distally, anterior tibialis was patent, patient is to continue supportive therapy with aspirin #3. Chronic renal insufficiency, CK D stage III, creatinine is stable after the angiogram #4. Hyponatremia, likely due to intravascular depletion, resolved #5. Hypokalemia, supplemented orally, resolved #6. Diabetes mellitus with significant hyperglycemia and admission, continue Levemir, sliding scale insulin, diabetic diet, hemoglobin A1c 14.6, patient's blood glucose readings are ranging between 128-260 in the hospital, patient was seen by diabetic educator, who recommended to continue insulin, the patient stated he prefered to get 1/3 cc of insulin syringe, will defer this prescription to his primary care physician #7. Essential hypertension, initiated patient on Norvasc, blood pressure has improved DISCHARGE CONDITIONS:   Stable  CONSULTS OBTAINED:  Treatment Team:  Albertine Patricia, DPM  DRUG ALLERGIES:  No  Known  Allergies  DISCHARGE MEDICATIONS:   Discharge Medication List as of 09/16/2016  3:20 PM    START taking these medications   Details  amLODipine (NORVASC) 10 MG tablet Take 1 tablet (10 mg total) by mouth daily., Starting Tue 09/16/2016, Normal    amoxicillin-clavulanate (AUGMENTIN) 875-125 MG tablet Take 1 tablet by mouth 2 (two) times daily., Starting Tue 09/16/2016, Normal      CONTINUE these medications which have NOT CHANGED   Details  aspirin EC 81 MG tablet Take 81 mg by mouth daily., Historical Med    atorvastatin (LIPITOR) 40 MG tablet Take 40 mg by mouth at bedtime., Historical Med    citalopram (CELEXA) 20 MG tablet Take 20 mg by mouth daily., Historical Med    hydrOXYzine (ATARAX/VISTARIL) 10 MG tablet Take 10 mg by mouth every 6 (six) hours as needed for itching. , Historical Med    insulin aspart (NOVOLOG) 100 UNIT/ML injection Inject 5 Units into the skin 3 (three) times daily before meals., Historical Med    insulin detemir (LEVEMIR) 100 UNIT/ML injection Inject 28 Units into the skin at bedtime., Historical Med    levothyroxine (SYNTHROID, LEVOTHROID) 150 MCG tablet Take 150 mcg by mouth daily before breakfast., Historical Med    lipase/protease/amylase (CREON) 12000 units CPEP capsule Take 24,000 Units by mouth 3 (three) times daily with meals. , Historical Med      STOP taking these medications     fludrocortisone (FLORINEF) 0.1 MG tablet          DISCHARGE INSTRUCTIONS:    The patient is to follow-up with Dr. Elvina Mattes tomorrow  If you experience worsening of your admission symptoms, develop shortness of breath, life threatening emergency, suicidal or homicidal thoughts you must seek medical attention immediately by calling 911 or calling your MD immediately  if symptoms less severe.  You Must read complete instructions/literature along with all the possible adverse reactions/side effects for all the Medicines you take and that have been prescribed to  you. Take any new Medicines after you have completely understood and accept all the possible adverse reactions/side effects.   Please note  You were cared for by a hospitalist during your hospital stay. If you have any questions about your discharge medications or the care you received while you were in the hospital after you are discharged, you can call the unit and asked to speak with the hospitalist on call if the hospitalist that took care of you is not available. Once you are discharged, your primary care physician will handle any further medical issues. Please note that NO REFILLS for any discharge medications will be authorized once you are discharged, as it is imperative that you return to your primary care physician (or establish a relationship with a primary care physician if you do not have one) for your aftercare needs so that they can reassess your need for medications and monitor your lab values.    Today   CHIEF COMPLAINT:   Chief Complaint  Patient presents with  . Toe Pain    HISTORY OF PRESENT ILLNESS:  Siddharth Babington  is a 65 y.o. male with a known history of diabetes, CKD stage III, hypothyroidism, who presents to the hospital with complaints of swelling and pain in right foot, right second toe dark discoloration. On arrival to the hospital he was felt to have right second toe gangrene and was admitted. He was seen by podiatrist, who recommended vascular evaluation due to poor peripheral pulses. X-ray of  right foot revealed minimal air in subcutaneous tissues at second distal phalanx compatible with open draining wound, per radiologist. Podiatrist did not feel that patient had progressing cellulitis or infection. The patient was initiated on Unasyn.  The patient was seen by vascular surgeon, Dr. Lucky Cowboy  and patient underwent angiogram, revealing posterior tibialis occlusion above the ankle with no reconstitution distally, consistent with small vessel disease. Patient was somewhat  reluctant to undergo operation, he requested to have that operation done on Friday, in about 4 days from now, this was discussed with Dr. Elvina Mattes, who recommended patient to be discharged home with follow-up with him tomorrow, 09/17/2016 for further discussions about operation. She was advised to continue Augmentin for infection prophylaxis. While in the hospital patient was noted to have significant hypertension requiring Norvasc administration. Discussion by problem: #1. Right second toe gangrene, status post angiogram 09/15/2016. Small vessel disease noted, not amenable for revascularization, continue Augmentin, blood cultures were negative, podiatrist was planning amputation of the second toe, patient would like to have that operation done on Friday, discussed with Dr. Elvina Mattes who recommended patient to follow-up with him as outpatient tomorrow, 09/17/2016 and he's office to further discuss timing of operation. #2. peripheral vascular disease, status post angiogram 09/15/2016 by Dr. Lucky Cowboy, posterior tibialis artery occlusion was noted above the ankle with no reconstitution distally, anterior tibialis was patent, patient is to continue supportive therapy with aspirin #3. Chronic renal insufficiency, CK D stage III, creatinine is stable after the angiogram #4. Hyponatremia, likely due to intravascular depletion, resolved #5. Hypokalemia, supplemented orally, resolved #6. Diabetes mellitus with significant hyperglycemia and admission, continue Levemir, sliding scale insulin, diabetic diet, hemoglobin A1c 14.6, patient's blood glucose readings are ranging between 128-260 in the hospital, patient was seen by diabetic educator, who recommended to continue insulin, the patient stated he prefered to get 1/3 cc of insulin syringe, will defer this prescription to his primary care physician #7. Essential hypertension, initiated patient on Norvasc, blood pressure has improved    VITAL SIGNS:  Blood pressure  125/62, pulse 68, temperature 97.9 F (36.6 C), temperature source Oral, resp. rate 14, height 5\' 11"  (1.803 m), weight 78.5 kg (173 lb), SpO2 100 %.  I/O:   Intake/Output Summary (Last 24 hours) at 09/16/16 1602 Last data filed at 09/16/16 1300  Gross per 24 hour  Intake             1360 ml  Output                0 ml  Net             1360 ml    PHYSICAL EXAMINATION:  GENERAL:  65 y.o.-year-old patient lying in the bed with no acute distress.  EYES: Pupils equal, round, reactive to light and accommodation. No scleral icterus. Extraocular muscles intact.  HEENT: Head atraumatic, normocephalic. Oropharynx and nasopharynx clear.  NECK:  Supple, no jugular venous distention. No thyroid enlargement, no tenderness.  LUNGS: Normal breath sounds bilaterally, no wheezing, rales,rhonchi or crepitation. No use of accessory muscles of respiration.  CARDIOVASCULAR: S1, S2 normal. No murmurs, rubs, or gallops.  ABDOMEN: Soft, non-tender, non-distended. Bowel sounds present. No organomegaly or mass.  EXTREMITIES: No pedal edema, cyanosis, or clubbing.  NEUROLOGIC: Cranial nerves II through XII are intact. Muscle strength 5/5 in all extremities. Sensation intact. Gait not checked.  PSYCHIATRIC: The patient is alert and oriented x 3.  SKIN: No obvious rash, lesion, or ulcer.   DATA REVIEW:   CBC  Recent Labs Lab 09/13/16 1201 09/15/16 0351  WBC 7.3  --   HGB 11.5* 11.2*  HCT 34.7*  --   PLT 202  --     Chemistries   Recent Labs Lab 09/13/16 1201  09/16/16 1235  NA 134*  < > 138  K 3.0*  < > 3.7  CL 99*  < > 105  CO2 28  < > 28  GLUCOSE 447*  < > 128*  BUN 18  < > 19  CREATININE 1.64*  < > 1.46*  CALCIUM 8.4*  < > 8.5*  AST 19  --   --   ALT 12*  --   --   ALKPHOS 136*  --   --   BILITOT 0.5  --   --   < > = values in this interval not displayed.  Cardiac Enzymes No results for input(s): TROPONINI in the last 168 hours.  Microbiology Results  Results for orders placed  or performed during the hospital encounter of 09/13/16  Culture, blood (Routine X 2) w Reflex to ID Panel     Status: None (Preliminary result)   Collection Time: 09/13/16  2:29 PM  Result Value Ref Range Status   Specimen Description BLOOD LEFT ARM  Final   Special Requests   Final    BOTTLES DRAWN AEROBIC AND ANAEROBIC AER 8CC ANA 11 CC   Culture NO GROWTH 3 DAYS  Final   Report Status PENDING  Incomplete  Culture, blood (Routine X 2) w Reflex to ID Panel     Status: None (Preliminary result)   Collection Time: 09/13/16  2:29 PM  Result Value Ref Range Status   Specimen Description BLOOD RIGHT ARM  Final   Special Requests   Final    BOTTLES DRAWN AEROBIC AND ANAEROBIC AER [place] ANA [place]   Culture NO GROWTH 3 DAYS  Final   Report Status PENDING  Incomplete    RADIOLOGY:  US Carotid Bilateral  Result Date: 09/15/2016 CLINICAL DATA:  TIA. EXAM: BILATERAL CAROTID DUPLEX ULTRASOUND TECHNIQUE: Pearline Cables scale imaging, color Doppler and duplex ultrasound were performed of bilateral carotid and vertebral arteries in the neck. COMPARISON:  No prior. FINDINGS: Criteria: Quantification of carotid stenosis is based on velocity parameters that correlate the residual internal carotid diameter with NASCET-based stenosis levels, using the diameter of the distal internal carotid lumen as the denominator for stenosis measurement. The following velocity measurements were obtained: RIGHT ICA:  119/11 cm/sec CCA:  474/2 cm/sec SYSTOLIC ICA/CCA RATIO:  1.0 DIASTOLIC ICA/CCA RATIO:  1.5 ECA:  154 cm/sec LEFT ICA:  97/9 cm/sec CCA:  595/6 cm/sec SYSTOLIC ICA/CCA RATIO:  0.8 DIASTOLIC ICA/CCA RATIO:  1.1 ECA:  151 cm/sec RIGHT CAROTID ARTERY: Mild right common carotid and proximal ICA atherosclerotic vascular plaque. No flow limiting stenosis. RIGHT VERTEBRAL ARTERY:  Patent with antegrade flow. LEFT CAROTID ARTERY: Mild left carotid bifurcation atherosclerotic vascular plaque. No flow limiting stenosis. LEFT VERTEBRAL  ARTERY:  Patent with antegrade flow. IMPRESSION: 1. Mild right common carotid and proximal ICA atherosclerotic vascular disease. No flow limiting stenosis. Degree of stenosis less than 50%. 2. Mild left proximal ICA atherosclerotic vascular disease. No flow limiting stenosis. Degree of stenosis less than 50%. 3.  Vertebral arteries are patent with antegrade flow. 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 EXAM: BILATERAL LOWER EXTREMITY  ARTERIAL DUPLEX SCAN TECHNIQUE: Gray-scale sonography as well as color Doppler and duplex ultrasound was performed to evaluate the arteries of both lower extremities. COMPARISON:  None. FINDINGS: Right lower extremity: The common femoral, superficial femoral, profunda femoral and popliteal arteries demonstrate normal velocities and triphasic Doppler waveforms. Mild amount of calcified plaque is present at the level of the profunda femoral origin. There is a minimal amount of plaque scattered in the SFA and popliteal arteries. Below the knee, the anterior tibial artery demonstrates triphasic Doppler waveform and normal velocity. The proximal posterior tibial artery demonstrates some mild plaque and monophasic waveform. Left lower extremity: The common femoral, superficial femoral, profunda femoral and popliteal arteries demonstrate normal triphasic Doppler waveforms. There is a mild amount of calcified plaque present in the left SFA and profunda femoral arteries. Below the knee, anterior tibial and posterior tibial arteries demonstrate normal triphasic waveforms and normal velocities. IMPRESSION: No significant proximal arterial occlusive disease identified in either lower extremity. There likely is some degree of tibial disease present in the distribution of the posterior tibial artery on the right. Distal tibial arteries and pedal vessels  were not interrogated. Electronically Signed   By: Aletta Edouard M.D.   On: 09/15/2016 12:25    EKG:   Orders placed or performed during the hospital encounter of 10/19/15  . ED EKG  . ED EKG  . EKG 12-Lead  . EKG 12-Lead      Management plans discussed with the patient, family and they are in agreement.  CODE STATUS:     Code Status Orders        Start     Ordered   09/13/16 1756  Full code  Continuous     09/13/16 1756    Code Status History    Date Active Date Inactive Code Status Order ID Comments User Context   This patient has a current code status but no historical code status.      TOTAL TIME TAKING CARE OF THIS PATIENT: 40 minutes.    Theodoro Grist M.D on 09/16/2016 at 4:02 PM  Between 7am to 6pm - Pager - [phone]  After 6pm go to [email or web address] - password [email or web address] [facility]  Office  [phone]  CC: Primary care physician; Tora Duck, MD

## 2016-09-16 NOTE — Progress Notes (Signed)
Hospitalist, Dr Ether Griffins, made aware of patient's current vision status as patient states that he is blind in the Left eye and has impaired vision in the right eye. Patient states that he drove himself to the hospital and that that he only drives during the day. Patient encouraged to call someone to pick him up and not to drive himself home.

## 2016-09-16 NOTE — Progress Notes (Signed)
Spoke with Dr. Marcello Moores to let him know that patient needed anesthesia consult before he discharged and that the patient only drives during daylight hours. Dr. Marcello Moores informed us to give him an hour and if the anesthesia has not seen the patient within that time frame, to let him go and they will take care of it later.

## 2016-09-16 NOTE — Consult Note (Signed)
Pharmacy Antibiotic Note  Cameron Adams is a 65 y.o. male admitted on 09/13/2016 with diabetic foot infectionPharmacy has been consulted for unasyn dosing.  Plan: Will continue Unasyn 3 grams iv q 6 hours.   Height: 5\' 11"  (180.3 cm) Weight: 173 lb (78.5 kg) IBW/kg (Calculated) : 75.3  Temp (24hrs), Avg:98.1 F (36.7 C), Min:97.6 F (36.4 C), Max:98.5 F (36.9 C)   Recent Labs Lab 09/13/16 1201 09/15/16 0351  WBC 7.3  --   CREATININE 1.64* 1.38*    Estimated Creatinine Clearance: 56.1 mL/min (by C-G formula based on SCr of 1.38 mg/dL (H)).    No Known Allergies  Antimicrobials this admission: unasyn 12/9 >>  vancomycin 12/9 >> one dose  Dose adjustments this admission:   Microbiology results: 12/9 BCx: NGTD  Thank you for allowing pharmacy to be a part of this patient's care.  Ulice Dash, PharmD Clinical Pharmacist  09/16/2016 10:34 AM

## 2016-09-16 NOTE — Progress Notes (Signed)
[facility]  Daily Progress Note   Subjective: 1 Day Post-Op: Ultrasound guidance for vascular access left femoral artery, Catheter placement into right superficial femoral artery from left femoral approach, Aortogram and selective right lower extremity angiogram with StarClose closure device left femoral artery.   Patient without complaint this AM.   Objective: Vitals:   09/15/16 2001 09/15/16 2059 09/16/16 0401 09/16/16 0753  BP: (!) 168/49 (!) 179/63 (!) 168/59 (!) 182/114  Pulse: 71  64 67  Resp: 18  17   Temp: 98.5 F (36.9 C)  98.2 F (36.8 C) 97.6 F (36.4 C)  TempSrc: Oral  Oral Oral  SpO2: 99%  98% 98%  Weight:      Height:        Intake/Output Summary (Last 24 hours) at 09/16/16 1005 Last data filed at 09/16/16 0900  Gross per 24 hour  Intake             1900 ml  Output                0 ml  Net             1900 ml   Physical Exam: A&Ox3, NAD CV: RRR Pulmonary: CTA Bilaterally Abdomen: Soft, Nontender, Nondistended Left Groin: Procedure dressing in place. No swelling or drainage.  Vascular:  Right Lower Extremity: Warm, Non-tender, DP faint. Gangrene of toes.  Laboratory: CBC    Component Value Date/Time   WBC 7.3 09/13/2016 1201   HGB 11.2 (L) 09/15/2016 0351   HGB 10.3 (L) 10/14/2013 0435   HCT 34.7 (L) 09/13/2016 1201   HCT 31.1 (L) 10/14/2013 0435   PLT 202 09/13/2016 1201   PLT 197 10/14/2013 0435   BMET    Component Value Date/Time   NA 142 09/15/2016 0351   NA 138 10/14/2013 0435   K 3.5 09/15/2016 0351   K 3.9 10/14/2013 0435   CL 110 09/15/2016 0351   CL 106 10/14/2013 0435   CO2 28 09/15/2016 0351   CO2 27 10/14/2013 0435   GLUCOSE 149 (H) 09/15/2016 0351   GLUCOSE 104 (H) 10/14/2013 0435   BUN 18 09/15/2016 0351   BUN 11 10/14/2013 0435   CREATININE 1.38 (H) 09/15/2016 0351   CREATININE 1.52 (H) 10/16/2013 0517   CALCIUM 8.2 (L) 09/15/2016 0351   CALCIUM 8.1 (L) 10/14/2013 0435   GFRNONAA 52 (L)  09/15/2016 0351   GFRNONAA 48 (L) 10/16/2013 0517   GFRAA 60 (L) 09/15/2016 0351   GFRAA 56 (L) 10/16/2013 0517   Assessment/Planning: 65 year old male with 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 Patient has small vessel disease not amenable for revascularization.  2) No need for patient to follow up as outpatient. 3) Will sign off at this time. Please re-consult if necessary.   Marcelle Overlie PA-C 09/16/2016 10:05 AM

## 2016-09-16 NOTE — Progress Notes (Signed)
Anesthesia Pre-op Consult completed by Dr. Marcello Moores, MD.

## 2016-09-18 ENCOUNTER — Encounter: Payer: Self-pay | Admitting: *Deleted

## 2016-09-18 LAB — CULTURE, BLOOD (ROUTINE X 2)
Culture: NO GROWTH
Culture: NO GROWTH

## 2016-09-18 NOTE — Pre-Procedure Instructions (Signed)
DR Gildardo Griffes ANESTHESIOLOGIST REVIEWED PATIENT HISTORY ON 09/16/16 PRIOR TO PATIENT DISCHARGE

## 2016-09-19 ENCOUNTER — Ambulatory Visit: Payer: Medicare HMO | Admitting: Anesthesiology

## 2016-09-19 ENCOUNTER — Encounter: Payer: Self-pay | Admitting: *Deleted

## 2016-09-19 ENCOUNTER — Encounter
Admission: RE | Disposition: A | Discharge status: Home / Self Care | Payer: Self-pay | Source: Ambulatory Visit | Attending: Podiatry

## 2016-09-19 ENCOUNTER — Ambulatory Visit
Admission: RE | Admit: 2016-09-19 | Discharge: 2016-09-19 | Disposition: A | Discharge status: Home / Self Care | Payer: Medicare HMO | Source: Ambulatory Visit | Attending: Podiatry | Admitting: Podiatry

## 2016-09-19 DIAGNOSIS — Z7982 Long term (current) use of aspirin: Secondary | ICD-10-CM | POA: Diagnosis not present

## 2016-09-19 DIAGNOSIS — E1152 Type 2 diabetes mellitus with diabetic peripheral angiopathy with gangrene: Secondary | ICD-10-CM | POA: Insufficient documentation

## 2016-09-19 DIAGNOSIS — E1122 Type 2 diabetes mellitus with diabetic chronic kidney disease: Secondary | ICD-10-CM | POA: Insufficient documentation

## 2016-09-19 DIAGNOSIS — Z794 Long term (current) use of insulin: Secondary | ICD-10-CM | POA: Insufficient documentation

## 2016-09-19 DIAGNOSIS — I129 Hypertensive chronic kidney disease with stage 1 through stage 4 chronic kidney disease, or unspecified chronic kidney disease: Secondary | ICD-10-CM | POA: Diagnosis not present

## 2016-09-19 DIAGNOSIS — I96 Gangrene, not elsewhere classified: Secondary | ICD-10-CM | POA: Diagnosis present

## 2016-09-19 DIAGNOSIS — Z87891 Personal history of nicotine dependence: Secondary | ICD-10-CM | POA: Insufficient documentation

## 2016-09-19 DIAGNOSIS — Z79899 Other long term (current) drug therapy: Secondary | ICD-10-CM | POA: Diagnosis not present

## 2016-09-19 DIAGNOSIS — N183 Chronic kidney disease, stage 3 (moderate): Secondary | ICD-10-CM | POA: Diagnosis not present

## 2016-09-19 HISTORY — PX: AMPUTATION TOE: SHX6595

## 2016-09-19 LAB — GLUCOSE, CAPILLARY
GLUCOSE-CAPILLARY: 256 mg/dL — AB (ref 65–99)
Glucose-Capillary: 207 mg/dL — ABNORMAL HIGH (ref 65–99)

## 2016-09-19 SURGERY — AMPUTATION, TOE
Anesthesia: General | Site: Toe | Laterality: Right | Wound class: Dirty or Infected

## 2016-09-19 MED ORDER — LIDOCAINE HCL (PF) 1 % IJ SOLN
INTRAMUSCULAR | Status: AC
  Filled 2016-09-19: qty 30

## 2016-09-19 MED ORDER — SODIUM CHLORIDE 0.9 % IV SOLN
INTRAVENOUS | Status: DC
  Administered 2016-09-19 (×2): via INTRAVENOUS

## 2016-09-19 MED ORDER — HYDROCODONE-ACETAMINOPHEN 5-325 MG PO TABS: 1.0000 | ORAL_TABLET | Freq: Four times a day (QID) | ORAL | 0 refills | Status: DC | PRN

## 2016-09-19 MED ORDER — LIDOCAINE HCL 1 % IJ SOLN
INTRAMUSCULAR | Status: DC | PRN
  Administered 2016-09-19: 3.5 mL

## 2016-09-19 MED ORDER — ONDANSETRON HCL 4 MG/2ML IJ SOLN
INTRAMUSCULAR | Status: DC | PRN
Start: 2016-09-19 — End: 2016-09-19
  Administered 2016-09-19: 4 mg via INTRAVENOUS

## 2016-09-19 MED ORDER — CEFAZOLIN SODIUM-DEXTROSE 2-4 GM/100ML-% IV SOLN
INTRAVENOUS | Status: AC
Start: 2016-09-19 — End: 2016-09-19
  Filled 2016-09-19: qty 100

## 2016-09-19 MED ORDER — FENTANYL CITRATE (PF) 100 MCG/2ML IJ SOLN: 25.0000 ug | INTRAMUSCULAR | Status: DC | PRN

## 2016-09-19 MED ORDER — POVIDONE-IODINE 7.5 % EX SOLN: CUTANEOUS | Status: DC | PRN

## 2016-09-19 MED ORDER — OXYCODONE HCL 5 MG PO TABS: 5.0000 mg | ORAL_TABLET | Freq: Once | ORAL | Status: DC | PRN

## 2016-09-19 MED ORDER — FENTANYL CITRATE (PF) 100 MCG/2ML IJ SOLN
INTRAMUSCULAR | Status: DC | PRN
  Administered 2016-09-19: 50 ug via INTRAVENOUS

## 2016-09-19 MED ORDER — LIDOCAINE HCL (CARDIAC) 20 MG/ML IV SOLN
INTRAVENOUS | Status: DC | PRN
  Administered 2016-09-19: 80 mg via INTRAVENOUS

## 2016-09-19 MED ORDER — OXYCODONE HCL 5 MG/5ML PO SOLN: 5.0000 mg | Freq: Once | ORAL | Status: DC | PRN

## 2016-09-19 MED ORDER — BUPIVACAINE HCL (PF) 0.5 % IJ SOLN
INTRAMUSCULAR | Status: AC
  Filled 2016-09-19: qty 30

## 2016-09-19 MED ORDER — GENTAMICIN SULFATE 40 MG/ML IJ SOLN
INTRAMUSCULAR | Status: AC
  Filled 2016-09-19: qty 4

## 2016-09-19 MED ORDER — EPHEDRINE SULFATE 50 MG/ML IJ SOLN
INTRAMUSCULAR | Status: DC | PRN
  Administered 2016-09-19: 10 mg via INTRAVENOUS

## 2016-09-19 MED ORDER — PROMETHAZINE HCL 25 MG/ML IJ SOLN: 6.2500 mg | INTRAMUSCULAR | Status: DC | PRN

## 2016-09-19 MED ORDER — MEPERIDINE HCL 25 MG/ML IJ SOLN: 6.2500 mg | INTRAMUSCULAR | Status: DC | PRN

## 2016-09-19 MED ORDER — VANCOMYCIN HCL 1000 MG IV SOLR
INTRAVENOUS | Status: AC
  Filled 2016-09-19: qty 1000

## 2016-09-19 MED ORDER — BUPIVACAINE HCL 0.5 % IJ SOLN
INTRAMUSCULAR | Status: DC | PRN
  Administered 2016-09-19: 3.5 mL

## 2016-09-19 MED ORDER — GLYCOPYRROLATE 0.2 MG/ML IJ SOLN
INTRAMUSCULAR | Status: DC | PRN
  Administered 2016-09-19: 0.2 mg via INTRAVENOUS

## 2016-09-19 MED ORDER — CEFAZOLIN SODIUM-DEXTROSE 2-4 GM/100ML-% IV SOLN
2.0000 g | Freq: Once | INTRAVENOUS | Status: AC
  Administered 2016-09-19: 2 g via INTRAVENOUS

## 2016-09-19 MED ORDER — MIDAZOLAM HCL 2 MG/2ML IJ SOLN
INTRAMUSCULAR | Status: DC | PRN
  Administered 2016-09-19: 1 mg via INTRAVENOUS

## 2016-09-19 MED ORDER — PROPOFOL 10 MG/ML IV BOLUS
INTRAVENOUS | Status: DC | PRN
  Administered 2016-09-19: 150 mg via INTRAVENOUS

## 2016-09-19 SURGICAL SUPPLY — 34 items
BANDAGE ELASTIC 4 LF NS (GAUZE/BANDAGES/DRESSINGS) ×2 IMPLANT
BANDAGE STRETCH 3X4.1 STRL (GAUZE/BANDAGES/DRESSINGS) ×2 IMPLANT
BLADE MED AGGRESSIVE (BLADE) ×2 IMPLANT
BLADE SURG 15 STRL LF DISP TIS (BLADE) ×4 IMPLANT
BLADE SURG 15 STRL SS (BLADE) ×4
BNDG ESMARK 4X12 TAN STRL LF (GAUZE/BANDAGES/DRESSINGS) IMPLANT
BNDG GAUZE 4.5X4.1 6PLY STRL (MISCELLANEOUS) ×2 IMPLANT
CANISTER SUCT 1200ML W/VALVE (MISCELLANEOUS) ×2 IMPLANT
CUFF TOURN 18 STER (MISCELLANEOUS) ×2 IMPLANT
CUFF TOURN DUAL PL 12 NO SLV (MISCELLANEOUS) IMPLANT
DRAPE FLUOR MINI C-ARM 54X84 (DRAPES) ×2 IMPLANT
DURAPREP 26ML APPLICATOR (WOUND CARE) ×2 IMPLANT
ELECT REM PT RETURN 9FT ADLT (ELECTROSURGICAL) ×2
ELECTRODE REM PT RTRN 9FT ADLT (ELECTROSURGICAL) ×1 IMPLANT
GAUZE PETRO XEROFOAM 1X8 (MISCELLANEOUS) ×2 IMPLANT
GAUZE SPONGE 4X4 12PLY STRL (GAUZE/BANDAGES/DRESSINGS) ×2 IMPLANT
GLOVE BIO SURGEON STRL SZ8 (GLOVE) ×2 IMPLANT
GLOVE INDICATOR 7.5 STRL GRN (GLOVE) ×2 IMPLANT
GOWN STRL REUS W/ TWL LRG LVL3 (GOWN DISPOSABLE) ×2 IMPLANT
GOWN STRL REUS W/TWL LRG LVL3 (GOWN DISPOSABLE) ×2
KIT RM TURNOVER STRD PROC AR (KITS) ×2 IMPLANT
LABEL OR SOLS (LABEL) ×2 IMPLANT
NDL SAFETY 18GX1.5 (NEEDLE) ×2 IMPLANT
NEEDLE HYPO 25X1 1.5 SAFETY (NEEDLE) ×6 IMPLANT
NS IRRIG 500ML POUR BTL (IV SOLUTION) ×2 IMPLANT
PACK EXTREMITY ARMC (MISCELLANEOUS) ×2 IMPLANT
PENCIL ELECTRO HAND CTR (MISCELLANEOUS) ×2 IMPLANT
RASP SM TEAR CROSS CUT (RASP) ×2 IMPLANT
STOCKINETTE STRL 6IN 960660 (GAUZE/BANDAGES/DRESSINGS) ×2 IMPLANT
SUT ETH BLK MONO 3 0 FS 1 12/B (SUTURE) ×2 IMPLANT
SUT ETHILON 5 0 PS 2 18 (SUTURE) ×2 IMPLANT
SUT VIC AB 4-0 FS2 27 (SUTURE) ×2 IMPLANT
SWAB CULTURE AMIES ANAERIB BLU (MISCELLANEOUS) IMPLANT
SYRINGE 10CC LL (SYRINGE) ×2 IMPLANT

## 2016-09-19 NOTE — Transfer of Care (Signed)
Immediate Anesthesia Transfer of Care Note  Patient: Cameron Adams  Procedure(s) Performed: Procedure(s): AMPUTATION TOE (Right)  Patient Location: PACU  Anesthesia Type:General  Level of Consciousness: awake, oriented and patient cooperative  Airway & Oxygen Therapy: Patient Spontanous Breathing and Patient connected to face mask oxygen  Post-op Assessment: Report given to RN, Post -op Vital signs reviewed and stable and Patient moving all extremities X 4  Post vital signs: Reviewed and stable  Last Vitals:  Vitals:   09/19/16 0911  BP: (!) 90/57  Pulse: 73  Temp: 36.7 C    Last Pain:  Vitals:   09/19/16 0911  TempSrc: Oral         Complications: No apparent anesthesia complications

## 2016-09-19 NOTE — Anesthesia Procedure Notes (Signed)
Procedure Name: LMA Insertion Date/Time: 09/19/2016 10:15 AM Performed by: Silvana Newness Pre-anesthesia Checklist: Patient identified, Emergency Drugs available, Suction available, Patient being monitored and Timeout performed Patient Re-evaluated:Patient Re-evaluated prior to inductionOxygen Delivery Method: Circle system utilized Preoxygenation: Pre-oxygenation with 100% oxygen Intubation Type: IV induction Ventilation: Mask ventilation without difficulty LMA: LMA inserted LMA Size: 4.5 Number of attempts: 2 Placement Confirmation: positive ETCO2 and breath sounds checked- equal and bilateral Dental Injury: Teeth and Oropharynx as per pre-operative assessment  Comments: Placed LMA 4.0 leaking around LMA, tidal volumes decreasing.  Removed LMA and placed LMA 4.5 with ease, no leaks TV 400s sats 100%.

## 2016-09-19 NOTE — Discharge Instructions (Signed)
Haivana Nakya DR. Andrews   1. Take your medication as prescribed.  Pain medication should be taken only as needed.  2. Keep the dressing clean, dry and intact.  3. Keep your foot elevated only to the hip level for the first 48 hours.  4. Walking to the bathroom and brief periods of walking are acceptable, unless we have instructed you to be non-weight bearing.  5. Always wear your post-op shoe when walking. Do not take a shower. Baths are permissible as long as the foot is kept out of the water.   6. Every hour you are awake:  - Bend your knee 15 times. - Flex foot 15 times - Massage calf 15 times  7. Call [facility] [phone]) if any of the following problems occur: - You develop a temperature or fever. - The bandage becomes saturated with blood. - Medication does not stop your pain. - Injury of the foot occurs. - Any symptoms of infection including redness, odor, or red streaks running from wound.  AMBULATORY SURGERY  DISCHARGE INSTRUCTIONS   1) The drugs that you were given will stay in your system until tomorrow so for the next 24 hours you should not:  A) Drive an automobile B) Make any legal decisions C) Drink any alcoholic beverage   2) You may resume regular meals tomorrow.  Today it is better to start with liquids and gradually work up to solid foods.  You may eat anything you prefer, but it is better to start with liquids, then soup and crackers, and gradually work up to solid foods.   3) Please notify your doctor immediately if you have any unusual bleeding, trouble breathing, redness and pain at the surgery site, drainage, fever, or pain not relieved by medication.    4) Additional Instructions:        Please contact your physician with any problems or Same Day Surgery at [phone], Monday through Friday 6 am to  4 pm, or [place] at [facility] number at [phone].

## 2016-09-19 NOTE — Anesthesia Postprocedure Evaluation (Signed)
Anesthesia Post Note  Patient: Cameron Adams  Procedure(s) Performed: Procedure(s) (LRB): AMPUTATION TOE (Right)  Patient location during evaluation: PACU Anesthesia Type: General Level of consciousness: awake and alert and oriented Pain management: pain level controlled Vital Signs Assessment: post-procedure vital signs reviewed and stable Respiratory status: spontaneous breathing, nonlabored ventilation and respiratory function stable Cardiovascular status: blood pressure returned to baseline and stable Postop Assessment: no signs of nausea or vomiting Anesthetic complications: no    Last Vitals:  Vitals:   09/19/16 1138 09/19/16 1202  BP: (!) 158/75 (!) 141/68  Pulse:  76  Resp:  12  Temp: 36.6 C 36.7 C    Last Pain:  Vitals:   09/19/16 1202  TempSrc: Oral  PainSc: 0-No pain                 Mitchell Epling

## 2016-09-19 NOTE — Op Note (Signed)
Operative note   Surgeon: Dr. Albertine Patricia, DPM.    Assistant:none    Preop diagnosis: Gangrene second toe right foot    Postop diagnosis: Same    Procedure:   1. Amputation second toe to the MTPJ level.          EBL: Approximately 8 cc    Anesthesia:general LMA given by the anesthesia team and local block of 7 cc of lidocaine and Marcaine plain mixed half and half injected at the base the operative site by me prior to surgery.    Hemostasis: None    Specimen: Gangrenous second toe right foot    Complications: None    Operative indications: Patient started developing gangrenous changes couple of weeks ago and has progressively worsened. Vascular consult showed microvascular disease but negative no more to improve flow to the foot and amputation was indicated.    Procedure:  Patient was brought into the OR and placed on the operating table in thesupine position. After anesthesia was obtained theright lower extremity was prepped and draped in usual sterile fashion.  Operative Report: This time attention was directed the second toe of the right foot dorsally. 2 semielliptical incisions were made around the toe preserving some of the tissue and normal scan at the base of toe both mediolaterally. The incision was carried down to bone at this point soft tissue tendon and ligament and capsule tissue was released from second MTP joint and the toe was removed in toto. This was sent to pathology for evaluation. Small bleeders were identified and bovied. After copious irrigation at this time the capsular and deep tissue was closed with 3-0 Vicryl simple interrupted sutures. Skin was closed this time with 3-0 nylon simple interrupted sutures. Tissues appear to be fairly healthy blood supply was reduced. No evidence of infection to the deeper tissues was noted. At this time a sterile dressing was placed across wound consisting of Xeroform gauze 4 x 4's conformation Kerlix and an Ace wrap.   Patient tolerated the procedure and anesthesia well.  Was transported from the OR to the PACU with all vital signs stable and vascular status intact. To be discharged per routine protocol.  Will follow up in approximately 1 week in the outpatient clinic.

## 2016-09-19 NOTE — H&P (Signed)
H and P has been reviewed and no changes are noted.  

## 2016-09-19 NOTE — Anesthesia Preprocedure Evaluation (Signed)
Anesthesia Evaluation  Patient identified by MRN, date of birth, ID band Patient awake    Reviewed: Allergy & Precautions, NPO status , Patient's Chart, lab work & pertinent test results  History of Anesthesia Complications Negative for: history of anesthetic complications  Airway Mallampati: II  TM Distance: >3 FB Neck ROM: Full    Dental  (+) Poor Dentition, Missing   Pulmonary neg sleep apnea, neg COPD, former smoker,    breath sounds clear to auscultation- rhonchi (-) wheezing      Cardiovascular Exercise Tolerance: Good hypertension, Pt. on medications + Peripheral Vascular Disease  (-) CAD and (-) Past MI  Rhythm:Regular Rate:Normal - Systolic murmurs and - Diastolic murmurs Echo 9/32/35:  Left ventricular hypertrophy - mild  Normal left ventricular systolic function, ejection fraction 65 to 57%  Diastolic dysfunction - grade II (elevated filling pressures)  Degenerative mitral valve disease  Dilated ascending aorta - mild  Normal right ventricular systolic function   Neuro/Psych negative neurological ROS  negative psych ROS   GI/Hepatic negative GI ROS, Neg liver ROS,   Endo/Other  diabetes, Insulin DependentHypothyroidism   Renal/GU CRFRenal disease     Musculoskeletal negative musculoskeletal ROS (+)   Abdominal (+) - obese,   Peds  Hematology negative hematology ROS (+)   Anesthesia Other Findings Past Medical History: No date: Blindness of left eye No date: Chronic kidney disease, stage III (moderate) No date: Diabetes (HCC) No date: Hypertension No date: Syncope and collapse No date: Thyroid dysfunction   Reproductive/Obstetrics                             Anesthesia Physical Anesthesia Plan  ASA: III  Anesthesia Plan: General   Post-op Pain Management:    Induction: Intravenous  Airway Management Planned: LMA  Additional Equipment:   Intra-op Plan:    Post-operative Plan:   Informed Consent: I have reviewed the patients History and Physical, chart, labs and discussed the procedure including the risks, benefits and alternatives for the proposed anesthesia with the patient or authorized representative who has indicated his/her understanding and acceptance.   Dental advisory given  Plan Discussed with: CRNA and Anesthesiologist  Anesthesia Plan Comments:         Anesthesia Quick Evaluation

## 2016-09-22 LAB — SURGICAL PATHOLOGY

## 2017-05-13 DIAGNOSIS — N183 Chronic kidney disease, stage 3 unspecified: Secondary | ICD-10-CM | POA: Insufficient documentation

## 2017-05-13 DIAGNOSIS — E119 Type 2 diabetes mellitus without complications: Secondary | ICD-10-CM

## 2017-05-13 DIAGNOSIS — IMO0001 Reserved for inherently not codable concepts without codable children: Secondary | ICD-10-CM | POA: Insufficient documentation

## 2017-05-13 DIAGNOSIS — F411 Generalized anxiety disorder: Secondary | ICD-10-CM | POA: Insufficient documentation

## 2017-05-13 DIAGNOSIS — Z794 Long term (current) use of insulin: Secondary | ICD-10-CM

## 2017-05-13 DIAGNOSIS — E039 Hypothyroidism, unspecified: Secondary | ICD-10-CM | POA: Insufficient documentation

## 2017-05-13 DIAGNOSIS — F3341 Major depressive disorder, recurrent, in partial remission: Secondary | ICD-10-CM | POA: Insufficient documentation

## 2017-05-13 DIAGNOSIS — E7800 Pure hypercholesterolemia, unspecified: Secondary | ICD-10-CM | POA: Insufficient documentation

## 2017-05-17 ENCOUNTER — Encounter: Payer: Self-pay | Admitting: Internal Medicine

## 2017-05-17 ENCOUNTER — Emergency Department: Payer: Medicare Other

## 2017-05-17 ENCOUNTER — Inpatient Hospital Stay
Admission: EM | Admit: 2017-05-17 | Discharge: 2017-05-20 | DRG: 637 | Disposition: A | Discharge status: Home Health | Payer: Medicare Other | Attending: Internal Medicine | Admitting: Internal Medicine

## 2017-05-17 DIAGNOSIS — E871 Hypo-osmolality and hyponatremia: Secondary | ICD-10-CM | POA: Diagnosis present

## 2017-05-17 DIAGNOSIS — Z794 Long term (current) use of insulin: Secondary | ICD-10-CM | POA: Diagnosis not present

## 2017-05-17 DIAGNOSIS — R1310 Dysphagia, unspecified: Secondary | ICD-10-CM

## 2017-05-17 DIAGNOSIS — IMO0002 Reserved for concepts with insufficient information to code with codable children: Secondary | ICD-10-CM | POA: Diagnosis present

## 2017-05-17 DIAGNOSIS — W109XXA Fall (on) (from) unspecified stairs and steps, initial encounter: Secondary | ICD-10-CM | POA: Diagnosis present

## 2017-05-17 DIAGNOSIS — E11621 Type 2 diabetes mellitus with foot ulcer: Secondary | ICD-10-CM | POA: Diagnosis present

## 2017-05-17 DIAGNOSIS — I129 Hypertensive chronic kidney disease with stage 1 through stage 4 chronic kidney disease, or unspecified chronic kidney disease: Secondary | ICD-10-CM | POA: Diagnosis present

## 2017-05-17 DIAGNOSIS — N183 Chronic kidney disease, stage 3 (moderate): Secondary | ICD-10-CM | POA: Diagnosis present

## 2017-05-17 DIAGNOSIS — E1122 Type 2 diabetes mellitus with diabetic chronic kidney disease: Secondary | ICD-10-CM | POA: Diagnosis present

## 2017-05-17 DIAGNOSIS — E039 Hypothyroidism, unspecified: Secondary | ICD-10-CM | POA: Diagnosis present

## 2017-05-17 DIAGNOSIS — Z87891 Personal history of nicotine dependence: Secondary | ICD-10-CM

## 2017-05-17 DIAGNOSIS — H5462 Unqualified visual loss, left eye, normal vision right eye: Secondary | ICD-10-CM | POA: Diagnosis present

## 2017-05-17 DIAGNOSIS — E860 Dehydration: Secondary | ICD-10-CM | POA: Diagnosis present

## 2017-05-17 DIAGNOSIS — K224 Dyskinesia of esophagus: Secondary | ICD-10-CM | POA: Diagnosis not present

## 2017-05-17 DIAGNOSIS — Z7982 Long term (current) use of aspirin: Secondary | ICD-10-CM | POA: Diagnosis not present

## 2017-05-17 DIAGNOSIS — S2241XA Multiple fractures of ribs, right side, initial encounter for closed fracture: Secondary | ICD-10-CM | POA: Diagnosis present

## 2017-05-17 DIAGNOSIS — D631 Anemia in chronic kidney disease: Secondary | ICD-10-CM | POA: Diagnosis present

## 2017-05-17 DIAGNOSIS — L97529 Non-pressure chronic ulcer of other part of left foot with unspecified severity: Secondary | ICD-10-CM | POA: Diagnosis present

## 2017-05-17 DIAGNOSIS — R739 Hyperglycemia, unspecified: Secondary | ICD-10-CM | POA: Diagnosis not present

## 2017-05-17 DIAGNOSIS — S271XXA Traumatic hemothorax, initial encounter: Secondary | ICD-10-CM | POA: Diagnosis present

## 2017-05-17 DIAGNOSIS — Z9119 Patient's noncompliance with other medical treatment and regimen: Secondary | ICD-10-CM | POA: Diagnosis not present

## 2017-05-17 DIAGNOSIS — N179 Acute kidney failure, unspecified: Secondary | ICD-10-CM

## 2017-05-17 DIAGNOSIS — E1165 Type 2 diabetes mellitus with hyperglycemia: Secondary | ICD-10-CM | POA: Diagnosis present

## 2017-05-17 DIAGNOSIS — J942 Hemothorax: Secondary | ICD-10-CM | POA: Diagnosis not present

## 2017-05-17 DIAGNOSIS — Z79899 Other long term (current) drug therapy: Secondary | ICD-10-CM

## 2017-05-17 DIAGNOSIS — Z89421 Acquired absence of other right toe(s): Secondary | ICD-10-CM

## 2017-05-17 DIAGNOSIS — S2249XA Multiple fractures of ribs, unspecified side, initial encounter for closed fracture: Secondary | ICD-10-CM

## 2017-05-17 DIAGNOSIS — S2239XA Fracture of one rib, unspecified side, initial encounter for closed fracture: Secondary | ICD-10-CM

## 2017-05-17 LAB — CBC
HCT: 33.3 % — ABNORMAL LOW (ref 40.0–52.0)
Hemoglobin: 10.9 g/dL — ABNORMAL LOW (ref 13.0–18.0)
MCH: 26.9 pg (ref 26.0–34.0)
MCHC: 32.8 g/dL (ref 32.0–36.0)
MCV: 82.2 fL (ref 80.0–100.0)
PLATELETS: 157 10*3/uL (ref 150–440)
RBC: 4.05 MIL/uL — AB (ref 4.40–5.90)
RDW: 13.7 % (ref 11.5–14.5)
WBC: 8.5 10*3/uL (ref 3.8–10.6)

## 2017-05-17 LAB — URINALYSIS, COMPLETE (UACMP) WITH MICROSCOPIC
Bacteria, UA: NONE SEEN
Bilirubin Urine: NEGATIVE
HGB URINE DIPSTICK: NEGATIVE
Ketones, ur: NEGATIVE mg/dL
Leukocytes, UA: NEGATIVE
Nitrite: NEGATIVE
PH: 5 (ref 5.0–8.0)
PROTEIN: NEGATIVE mg/dL
Specific Gravity, Urine: 1.017 (ref 1.005–1.030)
Squamous Epithelial / LPF: NONE SEEN

## 2017-05-17 LAB — BASIC METABOLIC PANEL
Anion gap: 8 (ref 5–15)
BUN: 24 mg/dL — ABNORMAL HIGH (ref 6–20)
CALCIUM: 8.7 mg/dL — AB (ref 8.9–10.3)
CO2: 25 mmol/L (ref 22–32)
CREATININE: 2.35 mg/dL — AB (ref 0.61–1.24)
Chloride: 96 mmol/L — ABNORMAL LOW (ref 101–111)
GFR calc non Af Amer: 27 mL/min — ABNORMAL LOW (ref >60)
GFR, EST AFRICAN AMERICAN: 32 mL/min — AB (ref >60)
Glucose, Bld: 620 mg/dL (ref 65–99)
Potassium: 5.6 mmol/L — ABNORMAL HIGH (ref 3.5–5.1)
SODIUM: 129 mmol/L — AB (ref 135–145)

## 2017-05-17 LAB — GLUCOSE, CAPILLARY
Glucose-Capillary: 367 mg/dL — ABNORMAL HIGH (ref 65–99)
Glucose-Capillary: 319 mg/dL — ABNORMAL HIGH (ref 65–99)
Glucose-Capillary: 325 mg/dL — ABNORMAL HIGH (ref 65–99)
Glucose-Capillary: 290 mg/dL — ABNORMAL HIGH (ref 65–99)

## 2017-05-17 LAB — LACTIC ACID, PLASMA: Lactic Acid, Venous: 1.7 mmol/L (ref 0.5–1.9)

## 2017-05-17 MED ORDER — IPRATROPIUM-ALBUTEROL 0.5-2.5 (3) MG/3ML IN SOLN
3.0000 mL | Freq: Three times a day (TID) | RESPIRATORY_TRACT | Status: DC
  Administered 2017-05-17 – 2017-05-19 (×5): 3 mL via RESPIRATORY_TRACT
  Filled 2017-05-17 (×5): qty 3

## 2017-05-17 MED ORDER — ONDANSETRON HCL 4 MG PO TABS: 4.0000 mg | ORAL_TABLET | Freq: Four times a day (QID) | ORAL | Status: DC | PRN

## 2017-05-17 MED ORDER — IPRATROPIUM-ALBUTEROL 0.5-2.5 (3) MG/3ML IN SOLN
3.0000 mL | Freq: Four times a day (QID) | RESPIRATORY_TRACT | Status: DC
  Administered 2017-05-17: 3 mL via RESPIRATORY_TRACT
  Filled 2017-05-17: qty 3

## 2017-05-17 MED ORDER — ATORVASTATIN CALCIUM 20 MG PO TABS
40.0000 mg | ORAL_TABLET | Freq: Every day | ORAL | Status: DC
  Administered 2017-05-17 – 2017-05-19 (×3): 40 mg via ORAL
  Filled 2017-05-17 (×3): qty 2

## 2017-05-17 MED ORDER — DEXTROSE 5 % IV SOLN
500.0000 mg | INTRAVENOUS | Status: DC
  Administered 2017-05-17: 500 mg via INTRAVENOUS
  Filled 2017-05-17 (×2): qty 500

## 2017-05-17 MED ORDER — INSULIN DETEMIR 100 UNIT/ML ~~LOC~~ SOLN
30.0000 [IU] | Freq: Every day | SUBCUTANEOUS | Status: DC
  Administered 2017-05-17 – 2017-05-18 (×2): 30 [IU] via SUBCUTANEOUS
  Filled 2017-05-17 (×3): qty 0.3

## 2017-05-17 MED ORDER — LEVOTHYROXINE SODIUM 50 MCG PO TABS
150.0000 ug | ORAL_TABLET | Freq: Every day | ORAL | Status: DC
  Administered 2017-05-18: 09:00:00 150 ug via ORAL
  Filled 2017-05-17: qty 1

## 2017-05-17 MED ORDER — HYDROCODONE-ACETAMINOPHEN 10-325 MG PO TABS
1.0000 | ORAL_TABLET | ORAL | Status: DC | PRN
  Administered 2017-05-17 – 2017-05-19 (×3): 1 via ORAL
  Filled 2017-05-17 (×3): qty 1

## 2017-05-17 MED ORDER — FENTANYL CITRATE (PF) 100 MCG/2ML IJ SOLN
50.0000 ug | Freq: Once | INTRAMUSCULAR | Status: AC
  Administered 2017-05-17: 50 ug via INTRAVENOUS
  Filled 2017-05-17: qty 2

## 2017-05-17 MED ORDER — PANTOPRAZOLE SODIUM 40 MG IV SOLR
40.0000 mg | Freq: Two times a day (BID) | INTRAVENOUS | Status: DC
  Administered 2017-05-17 – 2017-05-19 (×4): 40 mg via INTRAVENOUS
  Filled 2017-05-17 (×4): qty 40

## 2017-05-17 MED ORDER — INSULIN ASPART 100 UNIT/ML ~~LOC~~ SOLN
0.0000 [IU] | Freq: Three times a day (TID) | SUBCUTANEOUS | Status: DC
  Administered 2017-05-17: 7 [IU] via SUBCUTANEOUS
  Administered 2017-05-18 (×2): 3 [IU] via SUBCUTANEOUS
  Administered 2017-05-18 – 2017-05-20 (×5): 2 [IU] via SUBCUTANEOUS
  Administered 2017-05-20: 1 [IU] via SUBCUTANEOUS
  Filled 2017-05-17 (×8): qty 1

## 2017-05-17 MED ORDER — INSULIN ASPART 100 UNIT/ML ~~LOC~~ SOLN
10.0000 [IU] | Freq: Once | SUBCUTANEOUS | Status: AC
  Administered 2017-05-17: 10 [IU] via SUBCUTANEOUS
  Filled 2017-05-17: qty 1

## 2017-05-17 MED ORDER — HYDROCODONE-ACETAMINOPHEN 10-325 MG PO TABS: 1.0000 | ORAL_TABLET | ORAL | Status: DC | PRN

## 2017-05-17 MED ORDER — SODIUM CHLORIDE 0.9 % IV BOLUS (SEPSIS)
500.0000 mL | Freq: Once | INTRAVENOUS | Status: AC
  Administered 2017-05-17: 500 mL via INTRAVENOUS

## 2017-05-17 MED ORDER — CITALOPRAM HYDROBROMIDE 20 MG PO TABS
20.0000 mg | ORAL_TABLET | Freq: Every day | ORAL | Status: DC
  Administered 2017-05-17 – 2017-05-20 (×4): 20 mg via ORAL
  Filled 2017-05-17 (×4): qty 1

## 2017-05-17 MED ORDER — MORPHINE SULFATE (PF) 2 MG/ML IV SOLN: 2.0000 mg | INTRAVENOUS | Status: DC | PRN

## 2017-05-17 MED ORDER — ASPIRIN EC 81 MG PO TBEC
81.0000 mg | DELAYED_RELEASE_TABLET | Freq: Every day | ORAL | Status: DC
  Administered 2017-05-17 – 2017-05-20 (×4): 81 mg via ORAL
  Filled 2017-05-17 (×4): qty 1

## 2017-05-17 MED ORDER — HYDROXYZINE HCL 10 MG PO TABS
10.0000 mg | ORAL_TABLET | Freq: Three times a day (TID) | ORAL | Status: DC | PRN
  Administered 2017-05-17: 10 mg via ORAL
  Filled 2017-05-17 (×2): qty 1

## 2017-05-17 MED ORDER — LIDOCAINE 5 % EX PTCH
1.0000 | MEDICATED_PATCH | CUTANEOUS | Status: DC
  Administered 2017-05-17 – 2017-05-19 (×3): 1 via TRANSDERMAL
  Filled 2017-05-17 (×4): qty 1

## 2017-05-17 MED ORDER — ACETAMINOPHEN 650 MG RE SUPP: 650.0000 mg | Freq: Four times a day (QID) | RECTAL | Status: DC | PRN

## 2017-05-17 MED ORDER — BISACODYL 10 MG RE SUPP: 10.0000 mg | Freq: Every day | RECTAL | Status: DC | PRN

## 2017-05-17 MED ORDER — SODIUM CHLORIDE 0.9 % IV SOLN
INTRAVENOUS | Status: DC
  Administered 2017-05-17 – 2017-05-20 (×6): via INTRAVENOUS

## 2017-05-17 MED ORDER — MORPHINE SULFATE (PF) 2 MG/ML IV SOLN
2.0000 mg | Freq: Once | INTRAVENOUS | Status: AC
  Administered 2017-05-17: 2 mg via INTRAVENOUS
  Filled 2017-05-17: qty 1

## 2017-05-17 MED ORDER — ACETAMINOPHEN 325 MG PO TABS: 650.0000 mg | ORAL_TABLET | Freq: Four times a day (QID) | ORAL | Status: DC | PRN

## 2017-05-17 MED ORDER — AMLODIPINE BESYLATE 5 MG PO TABS
5.0000 mg | ORAL_TABLET | Freq: Every day | ORAL | Status: DC
  Administered 2017-05-18 – 2017-05-20 (×2): 5 mg via ORAL
  Filled 2017-05-17 (×3): qty 1

## 2017-05-17 MED ORDER — ONDANSETRON HCL 4 MG/2ML IJ SOLN: 4.0000 mg | Freq: Four times a day (QID) | INTRAMUSCULAR | Status: DC | PRN

## 2017-05-17 MED ORDER — SODIUM CHLORIDE 0.9 % IV BOLUS (SEPSIS)
1000.0000 mL | Freq: Once | INTRAVENOUS | Status: AC
  Administered 2017-05-17: 1000 mL via INTRAVENOUS

## 2017-05-17 MED ORDER — DOCUSATE SODIUM 100 MG PO CAPS
100.0000 mg | ORAL_CAPSULE | Freq: Two times a day (BID) | ORAL | Status: DC
  Administered 2017-05-18 – 2017-05-19 (×4): 100 mg via ORAL
  Filled 2017-05-17 (×5): qty 1

## 2017-05-17 MED ORDER — PANCRELIPASE (LIP-PROT-AMYL) 12000-38000 UNITS PO CPEP
24000.0000 [IU] | ORAL_CAPSULE | Freq: Three times a day (TID) | ORAL | Status: DC
  Administered 2017-05-17 – 2017-05-20 (×9): 24000 [IU] via ORAL
  Filled 2017-05-17 (×10): qty 2

## 2017-05-17 NOTE — ED Notes (Signed)
Critical glucose reported to Dr Jacqualine Code - new orders received - discussed diabetic status with pt - he states he is insulin dependent diabetic and takes 30u of levemir every night but he has not checked his sugar or taken insulin since Thursday due to rib/right side pain

## 2017-05-17 NOTE — ED Notes (Signed)
Pt states that Friday he fell off a "step off" and hit his right side on a metal railing - since this time pt c/o severe right sided pain and shortness of breath - denies loss of consciousness - denies N/V - denies dizziness

## 2017-05-17 NOTE — ED Notes (Signed)
Patient transported to X-ray 

## 2017-05-17 NOTE — H&P (Signed)
History and Physical    Cameron Adams ZOX:[id number] DOB: 01-04-1951 DOA: 05/17/2017  Referring physician: Dr. Jacqualine Code PCP: Patient, No Pcp Per  Specialists: none  Chief Complaint: rib/chest pain  HPI: Cameron Adams is a 66 y.o. male has a past medical history significant for DM, HTN, and CKD who fell down some steps 2 days ago who presents to ER with worsening rib and pleuritic chest pain. In ER, pt was noted to have multiple rib fractures with hemothorax noted on CT. He is in severe pain despite IV morphine and Fentanyl. Initial blood sugar>600 and sodium is low once again. He is now admitted. No fever. No N/V/D.  Review of Systems: The patient denies anorexia, fever, weight loss,, vision loss, decreased hearing, hoarseness,  syncope, peripheral edema, balance deficits, hemoptysis, abdominal pain, melena, hematochezia, severe indigestion/heartburn, hematuria, incontinence, genital sores, muscle weakness, suspicious skin lesions, transient blindness, difficulty walking, depression, unusual weight change, abnormal bleeding, enlarged lymph nodes, angioedema, and breast masses.   Past Medical History:  Diagnosis Date  . Blindness of left eye   . Chronic kidney disease, stage III (moderate)   . Diabetes ([place])   . Hypertension   . Syncope and collapse   . Thyroid dysfunction    Past Surgical History:  Procedure Laterality Date  . 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 PERIPHERAL VASCULAR CATHETERIZATION N/A 09/15/2016   Procedure: Lower Extremity Angiography;  Surgeon: Algernon Huxley, MD;  Location: [place] [facility];  Service: Cardiovascular;  Laterality: N/A;  . THYROID SURGERY     Social History:  reports that he quit smoking about 38 years ago. His smoking use included Cigarettes. He has a 2.50 pack-year smoking history. He has never used smokeless tobacco. He  reports that he does not drink alcohol or use drugs.  No Known Allergies  Family History  Problem Relation Age of Onset  . Kidney failure Mother   . Heart disease Father   . Kidney failure Father   . Heart disease Brother     Prior to Admission medications   Medication Sig Start Date End Date Taking? Authorizing Provider  aspirin EC 81 MG tablet Take 81 mg by mouth daily.   Yes [provider]  atorvastatin (LIPITOR) 40 MG tablet Take 40 mg by mouth at bedtime.   Yes [provider]  citalopram (CELEXA) 20 MG tablet Take 20 mg by mouth daily.   Yes [provider]  hydroxypropyl methylcellulose / hypromellose (ISOPTO TEARS / GONIOVISC) 2.5 % ophthalmic solution Place 1 drop into both eyes 3 (three) times daily as needed for dry eyes.   Yes [provider]  hydrOXYzine (ATARAX/VISTARIL) 10 MG tablet Take 10 mg by mouth every 8 (eight) hours as needed for itching.    Yes [provider]  insulin aspart (NOVOLOG) 100 UNIT/ML injection Inject 5 Units into the skin 3 (three) times daily before meals.   Yes [provider]  insulin detemir (LEVEMIR) 100 UNIT/ML injection Inject 30 Units into the skin at bedtime.    Yes [provider]  levothyroxine (SYNTHROID, LEVOTHROID) 150 MCG tablet Take 150 mcg by mouth daily before breakfast.   Yes [provider]  lipase/protease/amylase (CREON) 12000 units CPEP capsule Take 24,000 Units by mouth 3 (three) times daily with meals.    Yes [provider]  neomycin-bacitracin-polymyxin (NEOSPORIN) [phone] ointment Apply 1 application topically  4 (four) times daily. Apply to toe   Yes [provider]  amLODipine (NORVASC) 10 MG tablet Take 1 tablet (10 mg total) by mouth daily. Patient not taking: Reported on 05/17/2017 09/16/16   Theodoro Grist, MD  HYDROcodone-acetaminophen (NORCO) 5-325 MG tablet Take 1 tablet by mouth every 6 (six) hours as needed for moderate  pain. Patient not taking: Reported on 05/17/2017 09/19/16   Albertine Patricia, DPM   Physical Exam: Vitals:   05/17/17 1130 05/17/17 1200 05/17/17 1215 05/17/17 1230  BP: (!) 146/66 (!) 150/71 (!) 116/59 (!) 156/72  Pulse: 72 71 64 68  Resp: 14 10 (!) 9 11  Temp:      TempSrc:      SpO2: 97% 100% 91% 99%  Weight:      Height:         General:  [name]/AT, WDWN, in moderate distress  Eyes: PERRL, EOMI, no scleral icterus, conjunctiva clear  ENT: moist oropharynx without exudate, TM's benign, dentition poor  Neck: supple, no lymphadenopathy. No bruits or thyromegaly  Cardiovascular: regular rate without MRG; 2+ peripheral pulses, no JVD, no peripheral edema  Respiratory: decreased breath sounds with slpinting and scattered rhonchi. Dullness at the bases. No wheezes or rales. Respiratory effort is increased  Abdomen: soft, non tender to palpation, positive bowel sounds, no guarding, no rebound  Skin: no rashes or lesions  Musculoskeletal: normal bulk and tone, no joint swelling  Psychiatric: normal mood and affect, A&OX3  Neurologic: CN 2-12 grossly intact, Motor strength 5/5 in all 4 groups with symmetric DTR's and non-focal sensory exam  Labs on Admission:  Basic Metabolic Panel:  Recent Labs Lab 05/17/17 1117  NA 129*  K 5.6*  CL 96*  CO2 25  GLUCOSE 620*  BUN 24*  CREATININE 2.35*  CALCIUM 8.7*   Liver Function Tests: No results for input(s): AST, ALT, ALKPHOS, BILITOT, PROT, ALBUMIN in the last 168 hours. No results for input(s): LIPASE, AMYLASE in the last 168 hours. No results for input(s): AMMONIA in the last 168 hours. CBC:  Recent Labs Lab 05/17/17 1117  WBC 8.5  HGB 10.9*  HCT 33.3*  MCV 82.2  PLT 157   Cardiac Enzymes: No results for input(s): CKTOTAL, CKMB, CKMBINDEX, TROPONINI in the last 168 hours.  BNP (last 3 results) No results for input(s): BNP in the last 8760 hours.  ProBNP (last 3 results) No results for input(s): PROBNP in the  last 8760 hours.  CBG:  Recent Labs Lab 05/17/17 1350  GLUCAP 367*    Radiological Exams on Admission: Ct Abdomen Pelvis Wo Contrast  Result Date: 05/17/2017 CLINICAL DATA:  [place] last week, continued chest, abdominal, and pelvic pain EXAM: CT CHEST, ABDOMEN AND PELVIS WITHOUT CONTRAST TECHNIQUE: Multidetector CT imaging of the chest, abdomen and pelvis was performed following the standard protocol without IV contrast. Sagittal and coronal MPR images reconstructed from axial data set. Neither oral nor intravenous contrast were administered. COMPARISON:  CT chest 10/12/2013 FINDINGS: CT CHEST FINDINGS Cardiovascular: Atherosclerotic calcifications in coronary arteries. Upper normal caliber of ascending thoracic aorta 3.9 cm transverse image 27. No pericardial effusion. Mediastinum/Nodes: Gaseous distention of esophagus throughout the thorax. Small amount of dependent fluid within thoracic esophagus. Base of cervical region normal appearance. Few normal sized mediastinal lymph nodes without thoracic adenopathy. Lungs/Pleura: Cylindrical bronchiectasis RIGHT upper and RIGHT lower lobes. Interstitial prominence in the RIGHT upper and RIGHT lower lobes may be postinflammatory, with previous exam demonstrating extensive infiltrates bilaterally. Question tracheal megaly. No acute pulmonary  infiltrate or pneumothorax. Small basilar RIGHT hemothorax. Calcified granuloma RIGHT upper lobe. Musculoskeletal: Acute fractures of the lateral RIGHT eighth ninth tenth and eleventh ribs as well as the posterior RIGHT ninth rib and posterolateral RIGHT eighth rib. Associated tiny foci of chest wall emphysema. CT ABDOMEN PELVIS FINDINGS Hepatobiliary: Gallbladder and liver normal appearance Pancreas: Normal appearance Spleen: Small splenule inferior to a normal-appearing spleen Adrenals/Urinary Tract: Suspected LEFT renal cyst 18 x 15 mm image 75. Kidneys otherwise normal appearance. No hydronephrosis, ureteral  calcification, or ureteral dilatation. Distended bladder. Stomach/Bowel: Normal appendix. Colon interposition between liver and diaphragm. Stomach and bowel loops otherwise unremarkable for technique. Vascular/Lymphatic: Atherosclerotic calcifications aorta and iliac arteries. No adenopathy. Reproductive: Mild prostatic enlargement. Seminal vessels unremarkable. Other: Tiny umbilical hernia containing fat. No free air or free fluid. Musculoskeletal: Question old healed fracture of the posterior LEFT 11th rib. No acute fractures. IMPRESSION: Multiple lower RIGHT rib fractures with minimal hemothorax at inferior RIGHT chest. Bronchiectasis RIGHT upper lobe and RIGHT lower lobe. No acute intrathoracic, intra-abdominal or intrapelvic abnormalities. Aortic Atherosclerosis (ICD10-I70.0). Electronically Signed   By: Lavonia Dana M.D.   On: 05/17/2017 13:31   Dg Chest 2 View  Result Date: 05/17/2017 CLINICAL DATA:  Fall 2 days ago with right-sided chest pain, initial encounter EXAM: CHEST  2 VIEW COMPARISON:  10/11/2013 FINDINGS: Cardiac shadow is within normal limits. Elevation of right hemidiaphragm is seen. Fractures of the right eighth through tenth ribs are seen. No pneumothorax or sizable effusion is seen. Degenerative changes in the thoracic spine are noted. IMPRESSION: Multiple right rib fractures without complicating factors. Electronically Signed   By: Inez Catalina M.D.   On: 05/17/2017 11:58   Ct Chest Wo Contrast  Result Date: 05/17/2017 CLINICAL DATA:  [place] last week, continued chest, abdominal, and pelvic pain EXAM: CT CHEST, ABDOMEN AND PELVIS WITHOUT CONTRAST TECHNIQUE: Multidetector CT imaging of the chest, abdomen and pelvis was performed following the standard protocol without IV contrast. Sagittal and coronal MPR images reconstructed from axial data set. Neither oral nor intravenous contrast were administered. COMPARISON:  CT chest 10/12/2013 FINDINGS: CT CHEST FINDINGS Cardiovascular:  Atherosclerotic calcifications in coronary arteries. Upper normal caliber of ascending thoracic aorta 3.9 cm transverse image 27. No pericardial effusion. Mediastinum/Nodes: Gaseous distention of esophagus throughout the thorax. Small amount of dependent fluid within thoracic esophagus. Base of cervical region normal appearance. Few normal sized mediastinal lymph nodes without thoracic adenopathy. Lungs/Pleura: Cylindrical bronchiectasis RIGHT upper and RIGHT lower lobes. Interstitial prominence in the RIGHT upper and RIGHT lower lobes may be postinflammatory, with previous exam demonstrating extensive infiltrates bilaterally. Question tracheal megaly. No acute pulmonary infiltrate or pneumothorax. Small basilar RIGHT hemothorax. Calcified granuloma RIGHT upper lobe. Musculoskeletal: Acute fractures of the lateral RIGHT eighth ninth tenth and eleventh ribs as well as the posterior RIGHT ninth rib and posterolateral RIGHT eighth rib. Associated tiny foci of chest wall emphysema. CT ABDOMEN PELVIS FINDINGS Hepatobiliary: Gallbladder and liver normal appearance Pancreas: Normal appearance Spleen: Small splenule inferior to a normal-appearing spleen Adrenals/Urinary Tract: Suspected LEFT renal cyst 18 x 15 mm image 75. Kidneys otherwise normal appearance. No hydronephrosis, ureteral calcification, or ureteral dilatation. Distended bladder. Stomach/Bowel: Normal appendix. Colon interposition between liver and diaphragm. Stomach and bowel loops otherwise unremarkable for technique. Vascular/Lymphatic: Atherosclerotic calcifications aorta and iliac arteries. No adenopathy. Reproductive: Mild prostatic enlargement. Seminal vessels unremarkable. Other: Tiny umbilical hernia containing fat. No free air or free fluid. Musculoskeletal: Question old healed fracture of the posterior LEFT 11th rib. No acute fractures.  IMPRESSION: Multiple lower RIGHT rib fractures with minimal hemothorax at inferior RIGHT chest. Bronchiectasis  RIGHT upper lobe and RIGHT lower lobe. No acute intrathoracic, intra-abdominal or intrapelvic abnormalities. Aortic Atherosclerosis (ICD10-I70.0). Electronically Signed   By: Lavonia Dana M.D.   On: 05/17/2017 13:31    EKG: Independently reviewed.  Assessment/Plan Principal Problem:   Uncontrolled diabetes mellitus ([place]) Active Problems:   Hyponatremia   Rib fractures   Hemothorax   Will admit to floor with IV fluids and IV pain meds. SSI for now but will consider insulin drip if sugars do not improve. Begin SVN's and incentive spirometry with empiric IV ABX. Consult Surgery for hemothorax. Repeat labs in Am.  Diet: clear liquids Fluids: NS@125  DVT Prophylaxis: TED hose  Code Status: FULL  Family Communication: none  Disposition Plan: home  Time spent: 55 min

## 2017-05-17 NOTE — Consult Note (Addendum)
Patient ID: Cameron Adams, male   DOB: 1951-01-20, 66 y.o.   MRN: [id number]  HPI Cameron Adams is a 66 y.o. male asked to see in consultation by Dr. Jacqualine Code ( case d/w him in detail). He percent to the emergency room complaining of right-sided rib pain and dizziness. 2 days ago the patient was walking down stairs and he tripped and landed with his right chest striking a metal bar. Since then he complains of chest wall pain. He reports that the pain is sharp and severe and worsening with deep inspiration. Patient denies any loss of consciousness. He is not hurting anywhere else. He has significant comorbidities including diabetes chronic kidney disease and currently his glycemia was greater than 600 with an acute kidney injury. His hemoglobin is stable but he did have a bump in his creatinine. CT scan of the chest abdomen and pelvis were personally reviewed. There is evidence of rib fractures from the eighth  To 11th rib. Minimal hemothorax. No evidence of contrast extravasation. No other injuries   HPI  Past Medical History:  Diagnosis Date  . Blindness of left eye   . Chronic kidney disease, stage III (moderate)   . Diabetes ([place])   . Hypertension   . Syncope and collapse   . Thyroid dysfunction     Past Surgical History:  Procedure Laterality Date  . 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 PERIPHERAL VASCULAR CATHETERIZATION N/A 09/15/2016   Procedure: Lower Extremity Angiography;  Surgeon: Algernon Huxley, MD;  Location: [place] [facility];  Service: Cardiovascular;  Laterality: N/A;  . THYROID SURGERY      Family History  Problem Relation Age of Onset  . Kidney failure Mother   . Heart disease Father   . Kidney failure Father   . Heart disease Brother     Social History Social History  Substance Use Topics  . Smoking status: Former Smoker    Packs/day:  0.10    Years: 25.00    Types: Cigarettes    Quit date: 10/31/1978  . Smokeless tobacco: Never Used     Comment: smoked 2-3 cigarettes/day  . Alcohol use No    No Known Allergies  Current Facility-Administered Medications  Medication Dose Route Frequency Provider Last Rate Last Dose  . 0.9 %  sodium chloride infusion   Intravenous Continuous Idelle Crouch, MD 125 mL/hr at 05/17/17 1619    . acetaminophen (TYLENOL) tablet 650 mg  650 mg Oral Q6H PRN Idelle Crouch, MD       Or  . acetaminophen (TYLENOL) suppository 650 mg  650 mg Rectal Q6H PRN Idelle Crouch, MD      . amLODipine (NORVASC) tablet 5 mg  5 mg Oral Daily Idelle Crouch, MD      . aspirin EC tablet 81 mg  81 mg Oral Daily Idelle Crouch, MD   81 mg at 05/17/17 1619  . atorvastatin (LIPITOR) tablet 40 mg  40 mg Oral QHS Idelle Crouch, MD      . azithromycin (ZITHROMAX) 500 mg in dextrose 5 % 250 mL IVPB  500 mg Intravenous Q24H Idelle Crouch, MD 250 mL/hr at 05/17/17 1630 500 mg at 05/17/17 1630  . bisacodyl (DULCOLAX) suppository 10 mg  10 mg Rectal Daily PRN Idelle Crouch, MD      . citalopram (CELEXA) tablet 20 mg  20 mg Oral Daily Idelle Crouch, MD   20 mg at 05/17/17 1619  . docusate sodium (COLACE) capsule 100 mg  100 mg Oral BID Idelle Crouch, MD      . HYDROcodone-acetaminophen (NORCO) 10-325 MG per tablet 1 tablet  1 tablet Oral Q4H PRN Idelle Crouch, MD      . hydrOXYzine (ATARAX/VISTARIL) tablet 10 mg  10 mg Oral Q8H PRN Idelle Crouch, MD      . insulin aspart (novoLOG) injection 0-9 Units  0-9 Units Subcutaneous TID WC Idelle Crouch, MD      . insulin detemir (LEVEMIR) injection 30 Units  30 Units Subcutaneous QHS Fulton Reek D, MD      . ipratropium-albuterol (DUONEB) 0.5-2.5 (3) MG/3ML nebulizer solution 3 mL  3 mL Nebulization TID Idelle Crouch, MD      . Derrill Memo ON 05/18/2017] levothyroxine (SYNTHROID, LEVOTHROID) tablet 150 mcg  150 mcg Oral QAC breakfast  Idelle Crouch, MD      . lidocaine (LIDODERM) 5 % 1 patch  1 patch Transdermal Q24H Delman Kitten, MD   1 patch at 05/17/17 1311  . lipase/protease/amylase (CREON) capsule 24,000 Units  24,000 Units Oral TID WC Idelle Crouch, MD      . morphine 2 MG/ML injection 2 mg  2 mg Intravenous Q2H PRN Idelle Crouch, MD      . ondansetron [facility]) tablet 4 mg  4 mg Oral Q6H PRN Idelle Crouch, MD       Or  . ondansetron (ZOFRAN) injection 4 mg  4 mg Intravenous Q6H PRN Idelle Crouch, MD      . pantoprazole (PROTONIX) injection 40 mg  40 mg Intravenous Q12H Idelle Crouch, MD         Review of Systems Full ROS  was asked and was negative except for the information on the HPI  Physical Exam Blood pressure (!) 126/58, pulse (!) 59, temperature 98.3 F (36.8 C), temperature source Oral, resp. rate 12, height 5\' 11"  (1.803 m), weight 74 kg (163 lb 1.6 oz), SpO2 98 %. CONSTITUTIONAL: NAD, alert EYES: Pupils are equal, round, and reactive to light, Sclera are non-icteric. EARS, NOSE, MOUTH AND THROAT: The oropharynx is clear. The oral mucosa is pink and moist. Hearing is intact to voice. LYMPH NODES:  Lymph nodes in the neck are normal. Spine: no c spine tenderness, flex/ext w/o issues. No back tenderness RESPIRATORY:  Lungs are clear. There is normal respiratory effort, with equal breath sounds bilaterally, and without pathologic use of accessory muscles. Posterior chest wall tenderness, no evidence of expanding hematoma or sub q air CARDIOVASCULAR: Heart is regular without murmurs, gallops, or rubs. GI: The abdomen is  soft, nontender, and nondistended. There are no palpable masses. There is no hepatosplenomegaly. There are normal bowel sounds in all quadrants. GU: Rectal deferred.   MUSCULOSKELETAL: Normal muscle strength and tone. No cyanosis or edema.   SKIN: Turgor is good and there are no pathologic skin lesions or ulcers. NEUROLOGIC: Motor and sensation is grossly normal.  Cranial nerves are grossly intact. PSYCH:  Oriented to person, place and time. Affect is normal.  Data Reviewed  I have personally reviewed the patient's imaging, laboratory findings and medical records.    Assessment/Plan 66 year old male with multiple right-sided rib fractures and I am miniscule hemothorax. Turning no need for chest tube or surgical intervention at this time. Recommend aggressive pulmonary toilet with incentive spirometer and aggressive pain control. We'll  recheck a chest x-ray in the morning and will continue to follow along with you. He does have an acute kidney injury that is properly being addressed with crystalloid resuscitation. Scars with nursing staff and with the patient about my thought process.  Caroleen Hamman, MD FACS General Surgeon 05/17/2017, 5:07 PM

## 2017-05-17 NOTE — Progress Notes (Signed)
Notified Dr. Dahlia Byes about patient swallowing; verbal order for speech eval; Will continue to monitor.

## 2017-05-17 NOTE — ED Notes (Signed)
CBG 367

## 2017-05-17 NOTE — ED Notes (Signed)
Patient transported to CT 

## 2017-05-17 NOTE — ED Triage Notes (Signed)
Pt arrived via POV from home with reports of falling into an iron rail on Friday. Pt states when he fell he hit the right side of his rib cage. C/o pain on the right side when taking a deep breath and when coughing. Pt also states his blood pressure has been low and has been feeling dizzy and has had no appetite.

## 2017-05-17 NOTE — ED Provider Notes (Signed)
[facility] Emergency Department Provider Note   ____________________________________________   First MD Initiated Contact with Patient 05/17/17 1122     (approximate)  I have reviewed the triage vital signs and the nursing notes.   HISTORY  Chief Complaint Fall; Rib Pain (right); and Dizziness    HPI Cameron Adams is a 66 y.o. male   Evaluation of right rib painwhich started on Friday. Patient was walking down stairs when he fell and landed with his right chest striking a metal bar.  40s been having moderate to severe pa especially with deep breathing over the right mid rib cage since that time. He says the pain has been severe enough he wasn't thinking much about anything else and stopped using his insulin  No left-sided chest pain. Reports some trouble breathing due to the pain. Mild pain in the right upper abdomen which are reports is due to the ribs  No nausea or vomiting. No fevers or chills. Size doctor on Wednesday and his blood pressure medicine was stopped as they noticed his blood pressure had been low, and he reports feeling slightly lightheaded with low blood pressure for at least a week or 2.  Did not strike his head. He did not injure his head or neck.   Past Medical History:  Diagnosis Date  . Blindness of left eye   . Chronic kidney disease, stage III (moderate)   . Diabetes ([place])   . Hypertension   . Syncope and collapse   . Thyroid dysfunction     Patient Active Problem List   Diagnosis Date Noted  . Toe gangrene ([place]) 09/16/2016  . PVD (peripheral vascular disease) ([place]) 09/16/2016  . Uncontrolled diabetes mellitus ([place]) 09/16/2016  . Hyponatremia 09/16/2016  . Hypokalemia 09/16/2016  . Essential hypertension, malignant 09/16/2016  . Abnormal angiogram 09/16/2016  . Gangrene ([place]) 09/13/2016  . Dyspnea 11/09/2013  . CAP (community acquired pneumonia) 10/31/2013  . Chest pain 10/31/2013  . Chronic respiratory  failure with hypoxia ([place]) 10/31/2013    Past Surgical History:  Procedure Laterality Date  . 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 PERIPHERAL VASCULAR CATHETERIZATION N/A 09/15/2016   Procedure: Lower Extremity Angiography;  Surgeon: Algernon Huxley, MD;  Location: [place] [facility];  Service: Cardiovascular;  Laterality: N/A;  . THYROID SURGERY      Prior to Admission medications   Medication Sig Start Date End Date Taking? Authorizing Provider  amLODipine (NORVASC) 10 MG tablet Take 1 tablet (10 mg total) by mouth daily. 09/16/16   Theodoro Grist, MD  amoxicillin-clavulanate (AUGMENTIN) 875-125 MG tablet Take 1 tablet by mouth 2 (two) times daily. 09/16/16   Theodoro Grist, MD  aspirin EC 81 MG tablet Take 81 mg by mouth daily.    [provider]  atorvastatin (LIPITOR) 40 MG tablet Take 40 mg by mouth at bedtime.    [provider]  cholecalciferol (VITAMIN D) 1000 units tablet Take 1,000 Units by mouth daily.    [provider]  citalopram (CELEXA) 20 MG tablet Take 20 mg by mouth daily.    [provider]  fludrocortisone (FLORINEF) 0.1 MG tablet Take 0.1 mg by mouth 2 (two) times daily.    [provider]  HYDROcodone-acetaminophen (NORCO) 5-325 MG tablet Take 1 tablet by mouth every 6 (six) hours as needed for moderate pain. 09/19/16   Albertine Patricia, DPM  hydroxypropyl methylcellulose / hypromellose (ISOPTO TEARS / GONIOVISC) 2.5 % ophthalmic solution Place 1 drop into both eyes 3 (three) times daily as needed for dry eyes.    [provider]  hydrOXYzine (ATARAX/VISTARIL) 10 MG tablet Take 10 mg by mouth every 8 (eight) hours as needed for itching.     [provider]  insulin aspart (NOVOLOG) 100 UNIT/ML injection Inject 5 Units into the skin 3 (three) times daily before meals.    [provider]  insulin detemir (LEVEMIR) 100 UNIT/ML injection Inject 28 Units into the skin at bedtime.    [provider]  insulin regular (NOVOLIN R,HUMULIN R) 250 units/2.36mL (100 units/mL) injection Inject 5 Units into the skin 3 (three) times daily before meals.    [provider]  levothyroxine (SYNTHROID, LEVOTHROID) 150 MCG tablet Take 150 mcg by mouth daily before breakfast.    [provider]  lipase/protease/amylase (CREON) 12000 units CPEP capsule Take 24,000 Units by mouth 3 (three) times daily with meals.     [provider]  vitamin C (ASCORBIC ACID) 500 MG tablet Take 500 mg by mouth daily.    [provider]  vitamin E (VITAMIN E) 400 UNIT capsule Take 400 Units by mouth daily.    [provider]    Allergies Patient has no known allergies.  Family History  Problem Relation Age of Onset  . Kidney failure Mother   . Heart disease Father   . Kidney failure Father   . Heart disease Brother     Social History Social History  Substance Use Topics  . Smoking status: Former Smoker    Packs/day: 0.10    Years: 25.00    Types: Cigarettes    Quit date: 10/31/1978  . Smokeless tobacco: Never Used     Comment: smoked 2-3 cigarettes/day  . Alcohol use No    Review of Systems Constitutional: No fever/chills Eyes: No visual changes. ENT: No sore throat. Cardiovascular: history of present illnessRespiratory: Dsee history of present illness Gastrointestinal: No abdominal pain.  No nausea, no vomiting.  No diarrhea.  No constipation. Genitourinary: Negative for dysuria. Musculoskeletal: Negative for back pain. Skin: Negative for rash. Neurological: Negative for headaches, focal weakness or numbness.    ____________________________________________   PHYSICAL EXAM:  VITAL SIGNS: ED Triage Vitals  Enc Vitals Group     BP 05/17/17 1111 (!) 92/48     Pulse Rate 05/17/17 1111 75     Resp 05/17/17 1111 18     Temp 05/17/17  1111 98.3 F (36.8 C)     Temp Source 05/17/17 1111 Oral     SpO2 05/17/17 1111 97 %     Weight 05/17/17 1111 175 lb (79.4 kg)     Height 05/17/17 1111 5\' 11"  (1.803 m)     Head Circumference --      Peak Flow --      Pain Score 05/17/17 1115 10     Pain Loc --      Pain Edu? --      Excl. in [place]? --     Constitutional: Alert and oriented. Well appearing and in no acute distress. Eyes: Conjunctivae are normal. Head: Atraumatic. Nose: No congestion/rhinnorhea. Mouth/Throat: Mucous membranes are moist. Neck: No stridor.   Cardiovascular: Normal rate, regular rhythm. Grossly normal heart sounds.  Good peripheral circulation. Respiratory: Normal respiratory effort.  No retractions. Lungs CTAB. Gastrointestinal: Soft and nontender. No distention. Musculoskeletal: No lower extremity tenderness nor edema. Neurologic:  Normal  speech and language. No gross focal neurologic deficits are appreciated.  Skin:  Skin is warm, dry and intact. No rash noted. Psychiatric: Mood and affect are normal. Speech and behavior are normal.  ____________________________________________   LABS (all labs ordered are listed, but only abnormal results are displayed)  Labs Reviewed  BASIC METABOLIC PANEL - Abnormal; Notable for the following:       Result Value   Sodium 129 (*)    Potassium 5.6 (*)    Chloride 96 (*)    Glucose, Bld 620 (*)    BUN 24 (*)    Creatinine, Ser 2.35 (*)    Calcium 8.7 (*)    GFR calc non Af Amer 27 (*)    GFR calc Af Amer 32 (*)    All other components within normal limits  CBC - Abnormal; Notable for the following:    RBC 4.05 (*)    Hemoglobin 10.9 (*)    HCT 33.3 (*)    All other components within normal limits  GLUCOSE, CAPILLARY - Abnormal; Notable for the following:    Glucose-Capillary 367 (*)    All other components within normal limits  LACTIC ACID, PLASMA  URINALYSIS, COMPLETE (UACMP) WITH MICROSCOPIC  CBG MONITORING, ED    ____________________________________________  EKG  Reviewed and interpreted by me at 11:30 Heart rate 75 QRS 90 QT normal  Normal sinus rhythm, no evidence of ischemia or ectopy ____________________________________________  RADIOLOGY  Ct Abdomen Pelvis Wo Contrast  Result Date: 05/17/2017 CLINICAL DATA:  [place] last week, continued chest, abdominal, and pelvic pain EXAM: CT CHEST, ABDOMEN AND PELVIS WITHOUT CONTRAST TECHNIQUE: Multidetector CT imaging of the chest, abdomen and pelvis was performed following the standard protocol without IV contrast. Sagittal and coronal MPR images reconstructed from axial data set. Neither oral nor intravenous contrast were administered. COMPARISON:  CT chest 10/12/2013 FINDINGS: CT CHEST FINDINGS Cardiovascular: Atherosclerotic calcifications in coronary arteries. Upper normal caliber of ascending thoracic aorta 3.9 cm transverse image 27. No pericardial effusion. Mediastinum/Nodes: Gaseous distention of esophagus throughout the thorax. Small amount of dependent fluid within thoracic esophagus. Base of cervical region normal appearance. Few normal sized mediastinal lymph nodes without thoracic adenopathy. Lungs/Pleura: Cylindrical bronchiectasis RIGHT upper and RIGHT lower lobes. Interstitial prominence in the RIGHT upper and RIGHT lower lobes may be postinflammatory, with previous exam demonstrating extensive infiltrates bilaterally. Question tracheal megaly. No acute pulmonary infiltrate or pneumothorax. Small basilar RIGHT hemothorax. Calcified granuloma RIGHT upper lobe. Musculoskeletal: Acute fractures of the lateral RIGHT eighth ninth tenth and eleventh ribs as well as the posterior RIGHT ninth rib and posterolateral RIGHT eighth rib. Associated tiny foci of chest wall emphysema. CT ABDOMEN PELVIS FINDINGS Hepatobiliary: Gallbladder and liver normal appearance Pancreas: Normal appearance Spleen: Small splenule inferior to a normal-appearing spleen  Adrenals/Urinary Tract: Suspected LEFT renal cyst 18 x 15 mm image 75. Kidneys otherwise normal appearance. No hydronephrosis, ureteral calcification, or ureteral dilatation. Distended bladder. Stomach/Bowel: Normal appendix. Colon interposition between liver and diaphragm. Stomach and bowel loops otherwise unremarkable for technique. Vascular/Lymphatic: Atherosclerotic calcifications aorta and iliac arteries. No adenopathy. Reproductive: Mild prostatic enlargement. Seminal vessels unremarkable. Other: Tiny umbilical hernia containing fat. No free air or free fluid. Musculoskeletal: Question old healed fracture of the posterior LEFT 11th rib. No acute fractures. IMPRESSION: Multiple lower RIGHT rib fractures with minimal hemothorax at inferior RIGHT chest. Bronchiectasis RIGHT upper lobe and RIGHT lower lobe. No acute intrathoracic, intra-abdominal or intrapelvic abnormalities. Aortic Atherosclerosis (ICD10-I70.0). Electronically Signed   By: Lavonia Dana  M.D.   On: 05/17/2017 13:31   Dg Chest 2 View  Result Date: 05/17/2017 CLINICAL DATA:  Fall 2 days ago with right-sided chest pain, initial encounter EXAM: CHEST  2 VIEW COMPARISON:  10/11/2013 FINDINGS: Cardiac shadow is within normal limits. Elevation of right hemidiaphragm is seen. Fractures of the right eighth through tenth ribs are seen. No pneumothorax or sizable effusion is seen. Degenerative changes in the thoracic spine are noted. IMPRESSION: Multiple right rib fractures without complicating factors. Electronically Signed   By: Inez Catalina M.D.   On: 05/17/2017 11:58   Ct Chest Wo Contrast  Result Date: 05/17/2017 CLINICAL DATA:  [place] last week, continued chest, abdominal, and pelvic pain EXAM: CT CHEST, ABDOMEN AND PELVIS WITHOUT CONTRAST TECHNIQUE: Multidetector CT imaging of the chest, abdomen and pelvis was performed following the standard protocol without IV contrast. Sagittal and coronal MPR images reconstructed from axial data set. Neither  oral nor intravenous contrast were administered. COMPARISON:  CT chest 10/12/2013 FINDINGS: CT CHEST FINDINGS Cardiovascular: Atherosclerotic calcifications in coronary arteries. Upper normal caliber of ascending thoracic aorta 3.9 cm transverse image 27. No pericardial effusion. Mediastinum/Nodes: Gaseous distention of esophagus throughout the thorax. Small amount of dependent fluid within thoracic esophagus. Base of cervical region normal appearance. Few normal sized mediastinal lymph nodes without thoracic adenopathy. Lungs/Pleura: Cylindrical bronchiectasis RIGHT upper and RIGHT lower lobes. Interstitial prominence in the RIGHT upper and RIGHT lower lobes may be postinflammatory, with previous exam demonstrating extensive infiltrates bilaterally. Question tracheal megaly. No acute pulmonary infiltrate or pneumothorax. Small basilar RIGHT hemothorax. Calcified granuloma RIGHT upper lobe. Musculoskeletal: Acute fractures of the lateral RIGHT eighth ninth tenth and eleventh ribs as well as the posterior RIGHT ninth rib and posterolateral RIGHT eighth rib. Associated tiny foci of chest wall emphysema. CT ABDOMEN PELVIS FINDINGS Hepatobiliary: Gallbladder and liver normal appearance Pancreas: Normal appearance Spleen: Small splenule inferior to a normal-appearing spleen Adrenals/Urinary Tract: Suspected LEFT renal cyst 18 x 15 mm image 75. Kidneys otherwise normal appearance. No hydronephrosis, ureteral calcification, or ureteral dilatation. Distended bladder. Stomach/Bowel: Normal appendix. Colon interposition between liver and diaphragm. Stomach and bowel loops otherwise unremarkable for technique. Vascular/Lymphatic: Atherosclerotic calcifications aorta and iliac arteries. No adenopathy. Reproductive: Mild prostatic enlargement. Seminal vessels unremarkable. Other: Tiny umbilical hernia containing fat. No free air or free fluid. Musculoskeletal: Question old healed fracture of the posterior LEFT 11th rib. No acute  fractures. IMPRESSION: Multiple lower RIGHT rib fractures with minimal hemothorax at inferior RIGHT chest. Bronchiectasis RIGHT upper lobe and RIGHT lower lobe. No acute intrathoracic, intra-abdominal or intrapelvic abnormalities. Aortic Atherosclerosis (ICD10-I70.0). Electronically Signed   By: Lavonia Dana M.D.   On: 05/17/2017 13:31    ____________________________________________   PROCEDURES  Procedure(s) performed: None  Procedures  Critical Care performed: No  ____________________________________________   INITIAL IMPRESSION / ASSESSMENT AND PLAN / ED COURSE  Pertinent labs & imaging results that were available during my care of the patient were reviewed by me and considered in my medical decision making (see chart for details).  Fall with right-sided chest pain. Chest x-ray demonstrated multiple right-sided rib fractures. Given fentanyl as good effect, patient resting but reports pain when aroused. Vital signs stable with mild hypotension which she reports is been ongoing for at least a week or 2. Significant elevation of his glucose, and normal anion gap without evidence of DKA. Continue to hydrate, provide insulin, has acute kidney injury and thus we'll obtain CT is without contrast to further evaluate for other trauma. No obvious pneumothorax  on chest x-ray  Clinical Course as of May 18 1407  Sun May 17, 2017  1405 Hospitalist will admit (Discussed with Dr. Doy Hutching) for hyperglycemia, AKI, pain control. Dr. Dahlia Byes will see in consult regarding rib fractures and small hemothorax.  Patient alert, stable. VS improved.  [MQ]  1406 Patient glucose improving.We'll give additional fluid, he is alert, reports pain beginning to creep back. We'll give additional morphine at this time his blood pressure now improved. Awaiting admission  [MQ]    Clinical Course User Index [MQ] Delman Kitten, MD     ____________________________________________   FINAL CLINICAL IMPRESSION(S) / ED  DIAGNOSES  Final diagnoses:  Closed fracture of multiple ribs of right side, initial encounter  AKI (acute kidney injury) ([place])  Hyperglycemia      NEW MEDICATIONS STARTED DURING THIS VISIT:  New Prescriptions   No medications on file     Note:  This document was prepared using Dragon voice recognition software and may include unintentional dictation errors.     Delman Kitten, MD 05/17/17 [phone]

## 2017-05-18 ENCOUNTER — Inpatient Hospital Stay: Payer: Medicare Other

## 2017-05-18 DIAGNOSIS — J942 Hemothorax: Secondary | ICD-10-CM

## 2017-05-18 LAB — GLUCOSE, CAPILLARY
GLUCOSE-CAPILLARY: 225 mg/dL — AB (ref 65–99)
Glucose-Capillary: 181 mg/dL — ABNORMAL HIGH (ref 65–99)
Glucose-Capillary: 230 mg/dL — ABNORMAL HIGH (ref 65–99)
Glucose-Capillary: 279 mg/dL — ABNORMAL HIGH (ref 65–99)

## 2017-05-18 LAB — COMPREHENSIVE METABOLIC PANEL
ALBUMIN: 2.7 g/dL — AB (ref 3.5–5.0)
ALT: 19 U/L (ref 17–63)
AST: 28 U/L (ref 15–41)
Alkaline Phosphatase: 79 U/L (ref 38–126)
Anion gap: 4 — ABNORMAL LOW (ref 5–15)
BILIRUBIN TOTAL: 0.9 mg/dL (ref 0.3–1.2)
BUN: 20 mg/dL (ref 6–20)
CHLORIDE: 107 mmol/L (ref 101–111)
CO2: 24 mmol/L (ref 22–32)
Calcium: 7.9 mg/dL — ABNORMAL LOW (ref 8.9–10.3)
Creatinine, Ser: 1.73 mg/dL — ABNORMAL HIGH (ref 0.61–1.24)
GFR calc Af Amer: 46 mL/min — ABNORMAL LOW (ref >60)
GFR calc non Af Amer: 39 mL/min — ABNORMAL LOW (ref >60)
GLUCOSE: 232 mg/dL — AB (ref 65–99)
Potassium: 4.5 mmol/L (ref 3.5–5.1)
Sodium: 135 mmol/L (ref 135–145)
Total Protein: 6.1 g/dL — ABNORMAL LOW (ref 6.5–8.1)

## 2017-05-18 LAB — CBC
HEMATOCRIT: 28.4 % — AB (ref 40.0–52.0)
Hemoglobin: 9.3 g/dL — ABNORMAL LOW (ref 13.0–18.0)
MCH: 26.6 pg (ref 26.0–34.0)
MCHC: 33 g/dL (ref 32.0–36.0)
MCV: 80.7 fL (ref 80.0–100.0)
Platelets: 138 10*3/uL — ABNORMAL LOW (ref 150–440)
RBC: 3.51 MIL/uL — ABNORMAL LOW (ref 4.40–5.90)
RDW: 13.8 % (ref 11.5–14.5)
WBC: 6.2 10*3/uL (ref 3.8–10.6)

## 2017-05-18 LAB — TSH: TSH: 23.722 u[IU]/mL — ABNORMAL HIGH (ref 0.350–4.500)

## 2017-05-18 MED ORDER — LEVOTHYROXINE SODIUM 100 MCG PO TABS
200.0000 ug | ORAL_TABLET | Freq: Every day | ORAL | Status: DC
  Administered 2017-05-19 – 2017-05-20 (×2): 200 ug via ORAL
  Filled 2017-05-18 (×2): qty 2

## 2017-05-18 NOTE — Progress Notes (Signed)
SURGICAL PROGRESS NOTE (cpt [phone])  Hospital Day(s): 1.   Post op day(s):  Marland Kitchen   Interval History: Patient seen and examined, no acute events or new complaints overnight. Patient reports Right lower chest musculoskeletal pain early this morning that has improved with incentive spirometry, which has likewise improved since this morning. Patient otherwise denies CP, SOB, fever/chills, N/V, or abdominal pain.  Review of Systems:  Constitutional: denies fever, chills  HEENT: denies cough or congestion  Respiratory: denies any shortness of breath  Cardiovascular: denies chest pain or palpitations  Gastrointestinal: denies abdominal pain, N/V, or diarrhea Genitourinary: denies burning with urination or urinary frequency Musculoskeletal: denies decreased motor or sensation, Right lower chest wall pain as per interval history Integumentary: denies any other rashes or skin discolorations Neurological: denies HA or vision/hearing changes   Vital signs in last 24 hours: [min-max] current  Temp:  [98 F (36.7 C)-98.2 F (36.8 C)] 98.1 F (36.7 C) (08/13 1426) Pulse Rate:  [61-80] 80 (08/13 1426) Resp:  [18] 18 (08/13 0549) BP: (114-128)/(46-62) 125/54 (08/13 1426) SpO2:  [94 %-98 %] 94 % (08/13 1426) Weight:  [165 lb 4.8 oz (75 kg)] 165 lb 4.8 oz (75 kg) (08/13 0549)     Height: 5\' 11"  (180.3 cm) Weight: 165 lb 4.8 oz (75 kg) BMI (Calculated): 22.8   Intake/Output this shift:  Total I/O In: 480 [P.O.:480] Out: -    Intake/Output last 2 shifts:  @IOLAST2SHIFTS @   Physical Exam:  Constitutional: alert, cooperative and no distress  HENT: normocephalic without obvious abnormality  Eyes: PERRL, EOM's grossly intact and symmetric  Neuro: CN II - XII grossly intact and symmetric without deficit  Respiratory: breathing non-labored at rest, using incentive spirometer to >1500 cc, moderate lower Right chest wall tenderness Cardiovascular: regular rate and sinus rhythm  Gastrointestinal:  soft, non-tender, and non-distended  Musculoskeletal: UE and LE FROM, no edema or wounds, motor and sensation grossly intact, NT   Labs:  CBC Latest Ref Rng & Units 05/18/2017 05/17/2017 09/15/2016  WBC 3.8 - 10.6 K/uL 6.2 8.5 -  Hemoglobin 13.0 - 18.0 g/dL 9.3(L) 10.9(L) 11.2(L)  Hematocrit 40.0 - 52.0 % 28.4(L) 33.3(L) -  Platelets 150 - 440 K/uL 138(L) 157 -   CMP Latest Ref Rng & Units 05/18/2017 05/17/2017 09/16/2016  Glucose 65 - 99 mg/dL 232(H) 620(HH) 128(H)  BUN 6 - 20 mg/dL 20 24(H) 19  Creatinine 0.61 - 1.24 mg/dL 1.73(H) 2.35(H) 1.46(H)  Sodium 135 - 145 mmol/L 135 129(L) 138  Potassium 3.5 - 5.1 mmol/L 4.5 5.6(H) 3.7  Chloride 101 - 111 mmol/L 107 96(L) 105  CO2 22 - 32 mmol/L 24 25 28   Calcium 8.9 - 10.3 mg/dL 7.9(L) 8.7(L) 8.5(L)  Total Protein 6.5 - 8.1 g/dL 6.1(L) - -  Total Bilirubin 0.3 - 1.2 mg/dL 0.9 - -  Alkaline Phos 38 - 126 U/L 79 - -  AST 15 - 41 U/L 28 - -  ALT 17 - 63 U/L 19 - -   Imaging studies:  Chest X-ray (05/18/2017) Worsening of hypoinflation on the right. No pneumothorax or pneumomediastinum or significant pleural effusion. Known multiple lower right lateral rib fractures. Probable bibasilar subsegmental atelectasis.  Assessment/Plan: (ICD-10's: J94.2, S22.41XA) 66 y.o. male with tiny Right hemothorax and multiple Right rib fractures (8 - 11) s/p fall from standing, complicated by pertinent comorbidities including poorly controlled diabetes mellitus, HTN, PAD, CKD (stage 3), Left eye blindness, and thyroid dysfunction (not otherwise specified).   - pain control prn   -  incentive spirometry  - follow-up CXR tomorrow morning  - medical management of comorbidities per medical team  - DVT prophylaxis, ambulation encouraged   All of the above findings and recommendations were discussed with the patient and the medical team, and all of patient's questions were answered to his expressed satisfaction.  Thank you for the opportunity to participate in  this patient's care.  -- Marilynne Drivers Rosana Hoes, MD, [place]: [facility] - Partnering for exceptional care. Office: [phone]

## 2017-05-18 NOTE — Progress Notes (Signed)
[facility] [place] NAME: Cameron Adams    MR#:  [id number]  DATE OF BIRTH:  1951-08-27  SUBJECTIVE:  CHIEF COMPLAINT:   Chief Complaint  Patient presents with  . Fall  . Rib Pain    right  . Dizziness     Had a fall, since than pain in chest and so not moving or walking much, did not take his insulin or even eat or drink much. Noted to have rib fractures and dehydration and very high Blood sugar. Now getting better.  REVIEW OF SYSTEMS:  CONSTITUTIONAL: No fever, fatigue or weakness.  EYES: No blurred or double vision.  EARS, NOSE, AND THROAT: No tinnitus or ear pain.  RESPIRATORY: No cough, shortness of breath, wheezing or hemoptysis.  CARDIOVASCULAR: No chest pain, orthopnea, edema.  GASTROINTESTINAL: No nausea, vomiting, diarrhea or abdominal pain.  GENITOURINARY: No dysuria, hematuria.  ENDOCRINE: No polyuria, nocturia,  HEMATOLOGY: No anemia, easy bruising or bleeding SKIN: No rash or lesion. MUSCULOSKELETAL: No joint pain or arthritis.   NEUROLOGIC: No tingling, numbness, weakness.  PSYCHIATRY: No anxiety or depression.   ROS  DRUG ALLERGIES:  No Known Allergies  VITALS:  Blood pressure 128/62, pulse 61, temperature 98 F (36.7 C), temperature source Oral, resp. rate 18, height 5\' 11"  (1.803 m), weight 75 kg (165 lb 4.8 oz), SpO2 98 %.  PHYSICAL EXAMINATION:  GENERAL:  66 y.o.-year-old patient lying in the bed with no acute distress.  EYES: Pupils equal, round, reactive to light and accommodation. No scleral icterus. Extraocular muscles intact.  HEENT: Head atraumatic, normocephalic. Oropharynx and nasopharynx clear.  NECK:  Supple, no jugular venous distention. No thyroid enlargement, no tenderness.  LUNGS: Normal breath sounds bilaterally, no wheezing, rales,rhonchi or crepitation. No use of accessory muscles of respiration. Tender in right lower chest. CARDIOVASCULAR: S1, S2 normal. No murmurs, rubs, or gallops.   ABDOMEN: Soft, nontender, nondistended. Bowel sounds present. No organomegaly or mass.  EXTREMITIES: No pedal edema, cyanosis, or clubbing.  NEUROLOGIC: Cranial nerves II through XII are intact. Muscle strength 4/5 in all extremities. Sensation intact. Gait not checked.  PSYCHIATRIC: The patient is alert and oriented x 3.  SKIN: No obvious rash, lesion, or ulcer. Ulceration on left 5th toe.  Physical Exam LABORATORY PANEL:   CBC  Recent Labs Lab 05/18/17 0528  WBC 6.2  HGB 9.3*  HCT 28.4*  PLT 138*   ------------------------------------------------------------------------------------------------------------------  Chemistries   Recent Labs Lab 05/18/17 0528  NA 135  K 4.5  CL 107  CO2 24  GLUCOSE 232*  BUN 20  CREATININE 1.73*  CALCIUM 7.9*  AST 28  ALT 19  ALKPHOS 79  BILITOT 0.9   ------------------------------------------------------------------------------------------------------------------  Cardiac Enzymes No results for input(s): TROPONINI in the last 168 hours. ------------------------------------------------------------------------------------------------------------------  RADIOLOGY:  Ct Abdomen Pelvis Wo Contrast  Result Date: 05/17/2017 CLINICAL DATA:  [place] last week, continued chest, abdominal, and pelvic pain EXAM: CT CHEST, ABDOMEN AND PELVIS WITHOUT CONTRAST TECHNIQUE: Multidetector CT imaging of the chest, abdomen and pelvis was performed following the standard protocol without IV contrast. Sagittal and coronal MPR images reconstructed from axial data set. Neither oral nor intravenous contrast were administered. COMPARISON:  CT chest 10/12/2013 FINDINGS: CT CHEST FINDINGS Cardiovascular: Atherosclerotic calcifications in coronary arteries. Upper normal caliber of ascending thoracic aorta 3.9 cm transverse image 27. No pericardial effusion. Mediastinum/Nodes: Gaseous distention of esophagus throughout the thorax. Small amount of dependent fluid within  thoracic esophagus. Base of cervical region normal  appearance. Few normal sized mediastinal lymph nodes without thoracic adenopathy. Lungs/Pleura: Cylindrical bronchiectasis RIGHT upper and RIGHT lower lobes. Interstitial prominence in the RIGHT upper and RIGHT lower lobes may be postinflammatory, with previous exam demonstrating extensive infiltrates bilaterally. Question tracheal megaly. No acute pulmonary infiltrate or pneumothorax. Small basilar RIGHT hemothorax. Calcified granuloma RIGHT upper lobe. Musculoskeletal: Acute fractures of the lateral RIGHT eighth ninth tenth and eleventh ribs as well as the posterior RIGHT ninth rib and posterolateral RIGHT eighth rib. Associated tiny foci of chest wall emphysema. CT ABDOMEN PELVIS FINDINGS Hepatobiliary: Gallbladder and liver normal appearance Pancreas: Normal appearance Spleen: Small splenule inferior to a normal-appearing spleen Adrenals/Urinary Tract: Suspected LEFT renal cyst 18 x 15 mm image 75. Kidneys otherwise normal appearance. No hydronephrosis, ureteral calcification, or ureteral dilatation. Distended bladder. Stomach/Bowel: Normal appendix. Colon interposition between liver and diaphragm. Stomach and bowel loops otherwise unremarkable for technique. Vascular/Lymphatic: Atherosclerotic calcifications aorta and iliac arteries. No adenopathy. Reproductive: Mild prostatic enlargement. Seminal vessels unremarkable. Other: Tiny umbilical hernia containing fat. No free air or free fluid. Musculoskeletal: Question old healed fracture of the posterior LEFT 11th rib. No acute fractures. IMPRESSION: Multiple lower RIGHT rib fractures with minimal hemothorax at inferior RIGHT chest. Bronchiectasis RIGHT upper lobe and RIGHT lower lobe. No acute intrathoracic, intra-abdominal or intrapelvic abnormalities. Aortic Atherosclerosis (ICD10-I70.0). Electronically Signed   By: Lavonia Dana M.D.   On: 05/17/2017 13:31   Dg Chest 2 View  Result Date: 05/18/2017 CLINICAL  DATA:  Status post fall 3 days ago sustaining multiple right rib fractures. Persistent chest discomfort. History of diabetes, community-acquired pneumonia, former smoker. EXAM: CHEST  2 VIEW COMPARISON:  Chest x-ray and chest CT scan of May 17, 2017 FINDINGS: Hypoinflation of the right lung is more conspicuous today. Multiple right lateral rib fractures are visible. On the left the lung is well-expanded. There are coarse lung markings at the left lung base more conspicuous today. The heart is normal in size. The pulmonary vascularity is not engorged. There is a stable 3 mm metallic density that projects along the base of the neck seen to lie adjacent to the left thyroid lobe on the previous CT scan. IMPRESSION: Worsening of hypoinflation on the right. No pneumothorax or pneumomediastinum or significant pleural effusion. Known multiple lower right lateral rib fractures. Probable bibasilar subsegmental atelectasis. Electronically Signed   By: David  Martinique M.D.   On: 05/18/2017 07:45   Dg Chest 2 View  Result Date: 05/17/2017 CLINICAL DATA:  Fall 2 days ago with right-sided chest pain, initial encounter EXAM: CHEST  2 VIEW COMPARISON:  10/11/2013 FINDINGS: Cardiac shadow is within normal limits. Elevation of right hemidiaphragm is seen. Fractures of the right eighth through tenth ribs are seen. No pneumothorax or sizable effusion is seen. Degenerative changes in the thoracic spine are noted. IMPRESSION: Multiple right rib fractures without complicating factors. Electronically Signed   By: Inez Catalina M.D.   On: 05/17/2017 11:58   Ct Chest Wo Contrast  Result Date: 05/17/2017 CLINICAL DATA:  [place] last week, continued chest, abdominal, and pelvic pain EXAM: CT CHEST, ABDOMEN AND PELVIS WITHOUT CONTRAST TECHNIQUE: Multidetector CT imaging of the chest, abdomen and pelvis was performed following the standard protocol without IV contrast. Sagittal and coronal MPR images reconstructed from axial data set.  Neither oral nor intravenous contrast were administered. COMPARISON:  CT chest 10/12/2013 FINDINGS: CT CHEST FINDINGS Cardiovascular: Atherosclerotic calcifications in coronary arteries. Upper normal caliber of ascending thoracic aorta 3.9 cm transverse image 27. No pericardial effusion. Mediastinum/Nodes:  Gaseous distention of esophagus throughout the thorax. Small amount of dependent fluid within thoracic esophagus. Base of cervical region normal appearance. Few normal sized mediastinal lymph nodes without thoracic adenopathy. Lungs/Pleura: Cylindrical bronchiectasis RIGHT upper and RIGHT lower lobes. Interstitial prominence in the RIGHT upper and RIGHT lower lobes may be postinflammatory, with previous exam demonstrating extensive infiltrates bilaterally. Question tracheal megaly. No acute pulmonary infiltrate or pneumothorax. Small basilar RIGHT hemothorax. Calcified granuloma RIGHT upper lobe. Musculoskeletal: Acute fractures of the lateral RIGHT eighth ninth tenth and eleventh ribs as well as the posterior RIGHT ninth rib and posterolateral RIGHT eighth rib. Associated tiny foci of chest wall emphysema. CT ABDOMEN PELVIS FINDINGS Hepatobiliary: Gallbladder and liver normal appearance Pancreas: Normal appearance Spleen: Small splenule inferior to a normal-appearing spleen Adrenals/Urinary Tract: Suspected LEFT renal cyst 18 x 15 mm image 75. Kidneys otherwise normal appearance. No hydronephrosis, ureteral calcification, or ureteral dilatation. Distended bladder. Stomach/Bowel: Normal appendix. Colon interposition between liver and diaphragm. Stomach and bowel loops otherwise unremarkable for technique. Vascular/Lymphatic: Atherosclerotic calcifications aorta and iliac arteries. No adenopathy. Reproductive: Mild prostatic enlargement. Seminal vessels unremarkable. Other: Tiny umbilical hernia containing fat. No free air or free fluid. Musculoskeletal: Question old healed fracture of the posterior LEFT 11th rib.  No acute fractures. IMPRESSION: Multiple lower RIGHT rib fractures with minimal hemothorax at inferior RIGHT chest. Bronchiectasis RIGHT upper lobe and RIGHT lower lobe. No acute intrathoracic, intra-abdominal or intrapelvic abnormalities. Aortic Atherosclerosis (ICD10-I70.0). Electronically Signed   By: Lavonia Dana M.D.   On: 05/17/2017 13:31    ASSESSMENT AND PLAN:   Principal Problem:   Uncontrolled diabetes mellitus ([place]) Active Problems:   Hyponatremia   Rib fractures   Hemothorax  * Uncontrolled Diabetes   Due to non compliance for last 2 days due to fall and pain.   Started on home dose insulin and now better controlled.  * Hyponatremia   Due to hyperglycemia, better now,.  * Rib fractures on right and hemothorax- small   Seen by surgery, no intervention.   Pain control, norco and morphine.   Encouraged to use spirometer.   PT eval.  * Hypothyroidism   TSH is high   Increase dose.   He had dysphagia also, so may not have got full prescribed dose.  * Dysphagia   This is chronic complain, feels like food is getting stuck in throat.   Also have diarrhea, was seen by Gi in [facility].   Was given pancreatic enz and liquid foods.   Have to crush his pills at home and sometimes have regurg of rotten food.   GI consult.  * foot ulcer- left 5th toe    Seen by podiatry recently in clinic.    Monitor.   All the records are reviewed and case discussed with Care Management/Social Workerr. Management plans discussed with the patient, family and they are in agreement.  CODE STATUS: Full.  TOTAL TIME TAKING CARE OF THIS PATIENT: 35 minutes.    POSSIBLE D/C IN 1-2 DAYS, DEPENDING ON CLINICAL CONDITION.   Vaughan Basta M.D on 05/18/2017   Between 7am to 6pm - Pager - [phone]  After 6pm go to [email or web address] - password [email or web address] [facility]  Office  [phone]  CC: Primary care physician; Patient, No Pcp Per  Note: This dictation was  prepared with Dragon dictation along with smaller phrase technology. Any transcriptional errors that result from this process are unintentional.

## 2017-05-18 NOTE — Evaluation (Addendum)
Clinical/Bedside Swallow Evaluation Patient Details  Name: Cameron Adams MRN: [id number] Date of Birth: May 21, 1951  Today's Date: 05/18/2017 Time: SLP Start Time (ACUTE ONLY): 1200 SLP Stop Time (ACUTE ONLY): 1300 SLP Time Calculation (min) (ACUTE ONLY): 60 min  Past Medical History:  Past Medical History:  Diagnosis Date  . Blindness of left eye   . Chronic kidney disease, stage III (moderate)   . Diabetes ([place])   . Hypertension   . Syncope and collapse   . Thyroid dysfunction    Past Surgical History:  Past Surgical History:  Procedure Laterality Date  . 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 PERIPHERAL VASCULAR CATHETERIZATION N/A 09/15/2016   Procedure: Lower Extremity Angiography;  Surgeon: Algernon Huxley, MD;  Location: [place] [facility];  Service: Cardiovascular;  Laterality: N/A;  . THYROID SURGERY     HPI:  Pt is a 67 y.o. male has a past medical history significant for thyroid issues, blindness in L eye, DM, HTN, and CKD who fell down some steps 2 days ago who presents to ER with worsening rib and pleuritic chest pain. In ER, pt was noted to have multiple rib fractures with hemothorax noted on CT. He is in severe pain despite IV morphine and Fentanyl. Initial blood sugar>600 and sodium is low once again. He is now admitted. No fever. Of note, pt describes moderate s/s of ESOPHAGEAL dysmotility; deficits c/b food "stopping in my chest", a "rotting smell" after 1-2 days "coming from me", need to go to the bathroom and regurgitate, belching, pressure feeling in the chest during and after meals. Pt stated he has not been eating much in past few weeks and has had moderate weight loss. Pt stated he is eating hot cereals and drinking juices.    Assessment / Plan / Recommendation Clinical Impression  Pt appears to present w/ adequate oropharyngeal phase swallow  function w/ no noted, overt s/s of aspiration noted w/ po trials during this session w/ thin liquids, and during meal of puree/soft solids. Pt fed self and was educated on following general aspiration precautions which he followed during po trials during session. No decline in respiratory status or change in vocal quality apparent w/ po trials given; pt consumed several ounces of thin liquids. Oral phase was [facility] for bolus management and clearing w/ liquids and purees. Pt is lacking many dentition and exhibited increased mastication time/effort to fully breakdown the food boluses of soft solids. He would benefit from a modified diet of softened, minced foods - less meats and breads. Of note, during the po trials and at home prior to admission, pt c/o Esophageal phase discomfort and described s/s of dysmotility at baseline at home. Pt endorses feelings of pressure post swallowing trials and even seemed hesitant to swallow the solid trials. Pt also described issues of Regurgitation and feelings of food "getting stuck" and "rotting inside of me". Educated pt on the importance of following general Reflux precautions to allow for easier Esophageal motility and clearing during intake. Pt needs to f/u w/ GI/MD re: REFLUX discussion, further assessment, and management; possible PPI. Any person is at increased risk for aspiration of REFLUX/regurgitated material thus impacting Pulmonary status. Gave brief education on Reflux precautions; aspiration precautions. Recommend aspiration precautions w/ any oral intake including sitting fully upright during and post meals. NSG updated on pt's status; precautions w/ Pills - to crush as he  desires/able to. Recommend more of a minced diet at this time. MD consulted re: eval results and recommendation for f/u w/ GI. Would want to r/o Esophageal dysmotility, or a Zenker's diverticulum, or a stricture.  SLP Visit Diagnosis: Dysphagia, pharyngoesophageal phase (R13.14)    Aspiration  Risk   (reduced from an oropharyngeal phase standpoint)    Diet Recommendation  Dysphagia level 2(minced, moist foods) w/ Thin liquids; general aspiration precautions; REFLUX precautions  Medication Administration: Crushed with puree    Other  Recommendations Recommended Consults: Consider GI evaluation;Consider esophageal assessment (increased risk for aspiration from a REFLUX standpoint) Oral Care Recommendations: Oral care BID;Patient independent with oral care;Staff/trained caregiver to provide oral care   Follow up Recommendations None      Frequency and Duration  n/a         Prognosis Prognosis for Safe Diet Advancement: Good Barriers to Reach Goals:  (GI dysmotility)      Swallow Study   General Date of Onset: 05/17/17 HPI: Pt is a 66 y.o. male has a past medical history significant for thyroid issues, blindness in L eye, DM, HTN, and CKD who fell down some steps 2 days ago who presents to ER with worsening rib and pleuritic chest pain. In ER, pt was noted to have multiple rib fractures with hemothorax noted on CT. He is in severe pain despite IV morphine and Fentanyl. Initial blood sugar>600 and sodium is low once again. He is now admitted. No fever. Of note, pt describes moderate s/s of ESOPHAGEAL dysmotility; deficits c/b food "stopping in my chest", a "rotting smell" after 1-2 days "coming from me", need to go to the bathroom and regurgitate, belching, pressure feeling in the chest during and after meals. Pt stated he has not been eating much in past few weeks and has had moderate weight loss. Pt stated he is eating hot cereals and drinking juices.  Type of Study: Bedside Swallow Evaluation Previous Swallow Assessment: none; has had some contact w/ GI through [facility]? Diet Prior to this Study: Thin liquids;Dysphagia 1 (puree) (some solids) Temperature Spikes Noted: No (wbc 6.2) Respiratory Status: Room air History of Recent Intubation: No Behavior/Cognition:  Alert;Cooperative;Pleasant mood Oral Cavity Assessment: Within Functional Limits Oral Care Completed by SLP: Recent completion by staff Oral Cavity - Dentition: Missing dentition Vision: Functional for self-feeding (L eye deficits) Self-Feeding Abilities: Able to feed self;Needs set up (assist - min) Patient Positioning: Upright in bed Baseline Vocal Quality: Normal Volitional Cough: Strong Volitional Swallow: Able to elicit    Oral/Motor/Sensory Function Overall Oral Motor/Sensory Function: Within functional limits   Ice Chips Ice chips: Within functional limits Presentation: Spoon (fed; 2 trials)   Thin Liquid Thin Liquid: Within functional limits Presentation: Cup;Self Fed;Straw (multiple sips of tea, water, Ensure) Other Comments: ~10+ ozs    Nectar Thick Nectar Thick Liquid: Not tested   Honey Thick Honey Thick Liquid: Not tested   Puree Puree: Within functional limits Presentation: Self Fed;Spoon (assisted; 5 trials)   Solid   GO   Solid: Impaired Presentation: Spoon;Self Fed (mech soft trials - 3) Oral Phase Impairments: Impaired mastication (lack of denitition; not wanting to swallow) Oral Phase Functional Implications: Impaired mastication (lack of dentition) Pharyngeal Phase Impairments:  (none) Other Comments: c/o Esophageal discomfort; need to chew a "long time" d/t lack of sufficient dentition         Orinda Kenner, MS, CCC-SLP Watson,Katherine 05/18/2017,3:26 PM

## 2017-05-18 NOTE — Progress Notes (Signed)
Inpatient Diabetes Program Recommendations  AACE/ADA: New Consensus Statement on Inpatient Glycemic Control (2015)  Target Ranges:  Prepandial:   less than 140 mg/dL      [place] postprandial:   less than 180 mg/dL (1-2 hours)      Critically ill patients:  140 - 180 mg/dL   Results for Cameron Adams, Cameron Adams (MRN [id number]) as of 05/18/2017 12:27  Ref. Range 05/18/2017 07:45 05/18/2017 11:42  Glucose-Capillary Latest Ref Range: 65 - 99 mg/dL 181 (H) 230 (H)    Home DM Meds: Levemir 30 units QHS        Novolog 5 units TID  Current Orders: Levemir 30 units QHS      Novolog Sensitive Correction Scale/ SSI (0-9 units) TID AC        MD- Note Levemir 30 units QHS started last PM.    CBGs elevated today.  Patient eating 100% meals today.  Please consider the following:  1. Increase Levemir to 32 units QHS  2. Start Novolog Meal Coverage: Novolog 4 units TID with meals to start (hold if pt eats <50% of meal)       --Will follow patient during hospitalization--  Wyn Quaker RN, MSN, CDE Diabetes Coordinator Inpatient Glycemic Control Team Team Pager: [phone] (8a-5p)

## 2017-05-19 ENCOUNTER — Inpatient Hospital Stay: Payer: Medicare Other

## 2017-05-19 DIAGNOSIS — K224 Dyskinesia of esophagus: Secondary | ICD-10-CM

## 2017-05-19 DIAGNOSIS — R1310 Dysphagia, unspecified: Secondary | ICD-10-CM

## 2017-05-19 LAB — MISC LABCORP TEST (SEND OUT): LABCORP TEST CODE: 1453

## 2017-05-19 LAB — GLUCOSE, CAPILLARY
GLUCOSE-CAPILLARY: 199 mg/dL — AB (ref 65–99)
GLUCOSE-CAPILLARY: 164 mg/dL — AB (ref 65–99)
GLUCOSE-CAPILLARY: 140 mg/dL — AB (ref 65–99)
Glucose-Capillary: 180 mg/dL — ABNORMAL HIGH (ref 65–99)
Glucose-Capillary: 198 mg/dL — ABNORMAL HIGH (ref 65–99)

## 2017-05-19 LAB — CBC
HEMATOCRIT: 29.5 % — AB (ref 40.0–52.0)
HEMOGLOBIN: 9.6 g/dL — AB (ref 13.0–18.0)
MCH: 26.8 pg (ref 26.0–34.0)
MCHC: 32.6 g/dL (ref 32.0–36.0)
MCV: 82.4 fL (ref 80.0–100.0)
Platelets: 145 10*3/uL — ABNORMAL LOW (ref 150–440)
RBC: 3.57 MIL/uL — AB (ref 4.40–5.90)
RDW: 13.9 % (ref 11.5–14.5)
WBC: 5.5 10*3/uL (ref 3.8–10.6)

## 2017-05-19 LAB — BASIC METABOLIC PANEL
Anion gap: 4 — ABNORMAL LOW (ref 5–15)
BUN: 17 mg/dL (ref 6–20)
CHLORIDE: 110 mmol/L (ref 101–111)
CO2: 25 mmol/L (ref 22–32)
Calcium: 8.3 mg/dL — ABNORMAL LOW (ref 8.9–10.3)
Creatinine, Ser: 1.63 mg/dL — ABNORMAL HIGH (ref 0.61–1.24)
GFR calc non Af Amer: 42 mL/min — ABNORMAL LOW (ref >60)
GFR, EST AFRICAN AMERICAN: 49 mL/min — AB (ref >60)
Glucose, Bld: 198 mg/dL — ABNORMAL HIGH (ref 65–99)
POTASSIUM: 4.5 mmol/L (ref 3.5–5.1)
SODIUM: 139 mmol/L (ref 135–145)

## 2017-05-19 MED ORDER — INSULIN GLARGINE 100 UNIT/ML ~~LOC~~ SOLN
30.0000 [IU] | Freq: Every day | SUBCUTANEOUS | Status: DC
  Administered 2017-05-19: 30 [IU] via SUBCUTANEOUS
  Filled 2017-05-19 (×2): qty 0.3

## 2017-05-19 MED ORDER — ENSURE ENLIVE PO LIQD
237.0000 mL | Freq: Three times a day (TID) | ORAL | Status: DC
  Administered 2017-05-19 – 2017-05-20 (×3): 237 mL via ORAL

## 2017-05-19 MED ORDER — PANTOPRAZOLE SODIUM 40 MG PO PACK
40.0000 mg | PACK | Freq: Two times a day (BID) | ORAL | Status: DC
  Administered 2017-05-19 – 2017-05-20 (×2): 40 mg via ORAL
  Filled 2017-05-19 (×3): qty 20

## 2017-05-19 MED ORDER — ADULT MULTIVITAMIN LIQUID CH
15.0000 mL | Freq: Every day | ORAL | Status: DC
  Administered 2017-05-19 – 2017-05-20 (×2): 15 mL via ORAL
  Filled 2017-05-19 (×2): qty 15

## 2017-05-19 MED ORDER — IPRATROPIUM-ALBUTEROL 0.5-2.5 (3) MG/3ML IN SOLN: 3.0000 mL | Freq: Four times a day (QID) | RESPIRATORY_TRACT | Status: DC | PRN

## 2017-05-19 NOTE — NC FL2 (Signed)
[place] LEVEL OF CARE SCREENING TOOL     IDENTIFICATION  Patient Name: Cameron Adams Birthdate: [birth year] Sex: male Admission Date (Current Location): 05/17/2017  [place] and [place] Number:  Engineering geologist and Address:  [facility], [address], [place], [place] [address]      Provider Number: [id number]  Attending Physician Name and Address:  Vaughan Basta, *  Relative Name and Phone Number:       Current Level of Care: Hospital Recommended Level of Care: [place] Prior Approval Number:    Date Approved/Denied:   PASRR Number: [id number]  Discharge Plan: SNF    Current Diagnoses: Patient Active Problem List   Diagnosis Date Noted  . Esophageal dysmotilities   . Dysphagia   . Rib fractures 05/17/2017  . Hemothorax 05/17/2017  . Toe gangrene ([place]) 09/16/2016  . PVD (peripheral vascular disease) ([place]) 09/16/2016  . Uncontrolled diabetes mellitus ([place]) 09/16/2016  . Hyponatremia 09/16/2016  . Hypokalemia 09/16/2016  . Essential hypertension, malignant 09/16/2016  . Abnormal angiogram 09/16/2016  . Gangrene ([place]) 09/13/2016  . Dyspnea 11/09/2013  . CAP (community acquired pneumonia) 10/31/2013  . Chest pain 10/31/2013  . Chronic respiratory failure with hypoxia (HCC) 10/31/2013    Orientation RESPIRATION BLADDER Height & Weight     Self, Time, Situation, Place  Normal Continent Weight: 171 lb 1.6 oz (77.6 kg) Height:  5\' 11"  (180.3 cm)  BEHAVIORAL SYMPTOMS/MOOD NEUROLOGICAL BOWEL NUTRITION STATUS   (none)  (none) Continent Diet (dysphagia 2)  AMBULATORY STATUS COMMUNICATION OF NEEDS Skin   Limited Assist Verbally Normal                       Personal Care Assistance Level of Assistance  Bathing, Dressing Bathing Assistance: Limited assistance   Dressing Assistance: Limited assistance     Functional Limitations Info  Sight Sight Info: Impaired        SPECIAL CARE  FACTORS FREQUENCY  PT (By licensed PT)                    Contractures Contractures Info: Not present    Additional Factors Info  Insulin Sliding Scale, Code Status, Allergies Code Status Info: full Allergies Info: nka           Current Medications (05/19/2017):  This is the current hospital active medication list Current Facility-Administered Medications  Medication Dose Route Frequency Provider Last Rate Last Dose  . 0.9 %  sodium chloride infusion   Intravenous Continuous Vaughan Basta, MD 75 mL/hr at 05/18/17 1941    . acetaminophen (TYLENOL) tablet 650 mg  650 mg Oral Q6H PRN Idelle Crouch, MD       Or  . acetaminophen (TYLENOL) suppository 650 mg  650 mg Rectal Q6H PRN Idelle Crouch, MD      . amLODipine (NORVASC) tablet 5 mg  5 mg Oral Daily Idelle Crouch, MD   5 mg at 05/18/17 0840  . aspirin EC tablet 81 mg  81 mg Oral Daily Idelle Crouch, MD   81 mg at 05/19/17 1014  . atorvastatin (LIPITOR) tablet 40 mg  40 mg Oral QHS Idelle Crouch, MD   40 mg at 05/18/17 2152  . bisacodyl (DULCOLAX) suppository 10 mg  10 mg Rectal Daily PRN Idelle Crouch, MD      . citalopram (CELEXA) tablet 20 mg  20 mg Oral Daily Idelle Crouch, MD   20  mg at 05/19/17 1014  . docusate sodium (COLACE) capsule 100 mg  100 mg Oral BID Idelle Crouch, MD   100 mg at 05/19/17 1015  . feeding supplement (ENSURE ENLIVE) (ENSURE ENLIVE) liquid 237 mL  237 mL Oral TID BM Vaughan Basta, MD   237 mL at 05/19/17 1427  . HYDROcodone-acetaminophen (NORCO) 10-325 MG per tablet 1 tablet  1 tablet Oral Q4H PRN Idelle Crouch, MD   1 tablet at 05/19/17 0424  . hydrOXYzine (ATARAX/VISTARIL) tablet 10 mg  10 mg Oral Q8H PRN Idelle Crouch, MD   10 mg at 05/17/17 2101  . insulin aspart (novoLOG) injection 0-9 Units  0-9 Units Subcutaneous TID WC Idelle Crouch, MD   2 Units at 05/19/17 1425  . insulin detemir (LEVEMIR) injection 30 Units  30 Units Subcutaneous  QHS Idelle Crouch, MD   30 Units at 05/18/17 2155  . ipratropium-albuterol (DUONEB) 0.5-2.5 (3) MG/3ML nebulizer solution 3 mL  3 mL Nebulization Q6H PRN Vaughan Basta, MD      . levothyroxine (SYNTHROID, LEVOTHROID) tablet 200 mcg  200 mcg Oral QAC breakfast Vaughan Basta, MD   200 mcg at 05/19/17 [phone]  . lidocaine (LIDODERM) 5 % 1 patch  1 patch Transdermal Q24H Delman Kitten, MD   1 patch at 05/19/17 1426  . lipase/protease/amylase (CREON) capsule 24,000 Units  24,000 Units Oral TID WC Idelle Crouch, MD   24,000 Units at 05/19/17 1426  . morphine 2 MG/ML injection 2 mg  2 mg Intravenous Q2H PRN Idelle Crouch, MD      . multivitamin liquid 15 mL  15 mL Oral Daily Vaughan Basta, MD   15 mL at 05/19/17 1427  . ondansetron (ZOFRAN) tablet 4 mg  4 mg Oral Q6H PRN Idelle Crouch, MD       Or  . ondansetron [facility]) injection 4 mg  4 mg Intravenous Q6H PRN Idelle Crouch, MD      . pantoprazole sodium (PROTONIX) 40 mg/20 mL oral suspension 40 mg  40 mg Oral BID AC Vaughan Basta, MD         Discharge Medications: Please see discharge summary for a list of discharge medications.  Relevant Imaging Results:  Relevant Lab Results:   Additional Information ss: [id number]  Shela Leff, LCSW

## 2017-05-19 NOTE — Clinical Social Work Note (Signed)
Clinical Social Work Assessment  Patient Details  Name: Cameron Adams MRN: [id number] Date of Birth: 09-01-1951  Date of referral:  05/19/17               Reason for consult:  Discharge Planning                Permission sought to share information with:  Chartered certified accountant granted to share information::  Yes, Verbal Permission Granted  Name::        Agency::     Relationship::     Contact Information:     Housing/Transportation Living arrangements for the past 2 months:  Single Family Home Source of Information:  Patient Patient Interpreter Needed:  None Criminal Activity/Legal Involvement Pertinent to Current Situation/Hospitalization:  No - Comment as needed Significant Relationships:  Adult Children Lives with:  Self Do you feel safe going back to the place where you live?  Yes Need for family participation in patient care:  Yes (Comment)  Care giving concerns:  Patient lives alone and there is concern he may not have been taking his medications appropriately and did not schedule a follow up appointment.  Social Worker assessment / plan:  CSW spoke with patient this afternoon and explained role and purpose of visit. Patient was alert and oriented at the time of CSW assessment. Patient stated that he lives alone and that his daughter visits him daily. He stated that with his vision, that sometime it doesn't prick his finger to text his blood sugars and sometimes has difficulty with giving himself insulin injections. 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 Patient states that he did not make his follow up appointment last time because he didn't have transportation but he assures CSW that his daughter can take him this time. Patient states he wishes to return home at discharge but is willing to have CSW do a bed search for [facility] in the event he changes his mind. Patient stated that he has been to STR twice in the past and went to  [facility] in [place] after staying at [facility]. Patient states he has heard good things about [facility]. CSW has initiated a bed search.     Employment status:  Retired Forensic scientist:  Medicare PT Recommendations:  [place] / Referral to community resources:     Patient/Family's Response to care:  Patient expressed appreciation for CSW visit and assistance. Patient/Family's Understanding of and Emotional Response to Diagnosis, Current Treatment, and Prognosis:  Patient is aware of his limitations but believes he will do better at home.   Emotional Assessment Appearance:  Appears stated age Attitude/Demeanor/Rapport:   (pleasant and cooperative) Affect (typically observed):  Accepting, Adaptable, Calm, Pleasant Orientation:  Oriented to Self, Oriented to Place, Oriented to  Time, Oriented to Situation Alcohol / Substance use:  Not Applicable Psych involvement (Current and /or in the community):  No (Comment)  Discharge Needs  Concerns to be addressed:  Care Coordination Readmission within the last 30 days:  No Current discharge risk:  None Barriers to Discharge:  No Barriers Identified   Shela Leff, LCSW 05/19/2017, 3:13 PM

## 2017-05-19 NOTE — Consult Note (Signed)
Cameron Lame, MD [facility]  [address] [place], [place] [address] Phone: [phone] Fax : [phone]  Consultation  Referring Provider:     Dr. Anselm Jungling Primary Care Physician:  Patient, No Pcp Per Primary Gastroenterologist:  Dr. At [facility]         Reason for Consultation:     Dysphagia  Date of Admission:  05/17/2017 Date of Consultation:  05/19/2017         HPI:   Cameron Adams is a 66 y.o. male who was admitted with a rib pain and refractory with a right hemothorax. The patient had been reporting dysphagia and a GI consultation was called. The patient states he has had dysphagia for many years. The patient denies a be any worse unusual. The patient had a consultation at [facility] which rate rest of that he undergo an upper endoscopy to look for the reason of his dysphagia. The patient did not follow up with [facility] for the EGD. The patient has no worsening of his complaints at this time.  Past Medical History:  Diagnosis Date  . Blindness of left eye   . Chronic kidney disease, stage III (moderate)   . Diabetes ([place])   . Hypertension   . Syncope and collapse   . Thyroid dysfunction     Past Surgical History:  Procedure Laterality Date  . 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 PERIPHERAL VASCULAR CATHETERIZATION N/A 09/15/2016   Procedure: Lower Extremity Angiography;  Surgeon: Algernon Huxley, MD;  Location: [facility];  Service: Cardiovascular;  Laterality: N/A;  . THYROID SURGERY      Prior to Admission medications   Medication Sig Start Date End Date Taking? Authorizing Provider  aspirin EC 81 MG tablet Take 81 mg by mouth daily.   Yes [provider]  atorvastatin (LIPITOR) 40 MG tablet Take 40 mg by mouth at bedtime.   Yes [provider]  citalopram (CELEXA) 20 MG tablet Take 20 mg by mouth daily.   Yes [provider]  hydroxypropyl methylcellulose / hypromellose (ISOPTO TEARS / GONIOVISC) 2.5 % ophthalmic solution Place 1 drop into both eyes 3 (three) times daily as needed for dry eyes.   Yes [provider]  hydrOXYzine (ATARAX/VISTARIL) 10 MG tablet Take 10 mg by mouth every 8 (eight) hours as needed for itching.    Yes [provider]  insulin aspart (NOVOLOG) 100 UNIT/ML injection Inject 5 Units into the skin 3 (three) times daily before meals.   Yes [provider]  insulin detemir (LEVEMIR) 100 UNIT/ML injection Inject 30 Units into the skin at bedtime.    Yes [provider]  levothyroxine (SYNTHROID, LEVOTHROID) 150 MCG tablet Take 150 mcg by mouth daily before breakfast.   Yes [provider]  lipase/protease/amylase (CREON) 12000 units CPEP capsule Take 24,000 Units by mouth 3 (three) times daily with meals.    Yes [provider]  neomycin-bacitracin-polymyxin (NEOSPORIN) [phone] ointment Apply 1 application topically 4 (four) times daily. Apply to toe   Yes [provider]  amLODipine (NORVASC) 10 MG tablet Take 1 tablet (10 mg total) by mouth daily. Patient not taking: Reported on 05/17/2017 09/16/16   Theodoro Grist, MD  HYDROcodone-acetaminophen (NORCO) 5-325 MG tablet Take 1 tablet by mouth every 6 (six) hours as needed for moderate pain. Patient not taking: Reported on 05/17/2017 09/19/16  Albertine Patricia, DPM    Family History  Problem Relation Age of Onset  . Kidney failure Mother   . Heart disease Father   . Kidney failure Father   . Heart disease Brother      Social History  Substance Use Topics  . Smoking status: Former Smoker    Packs/day: 0.10    Years: 25.00    Types: Cigarettes    Quit date: 10/31/1978  . Smokeless tobacco: Never Used     Comment: smoked 2-3 cigarettes/day  . Alcohol use No    Allergies as of 05/17/2017  . (No Known Allergies)    Review of Systems:    All systems reviewed and  negative except where noted in HPI.   Physical Exam:  Vital signs in last 24 hours: Temp:  [98.1 F (36.7 C)-98.6 F (37 C)] 98.6 F (37 C) (08/14 0448) Pulse Rate:  [74-81] 76 (08/14 1033) Resp:  [16] 16 (08/14 0448) BP: (92-143)/(43-67) 92/43 (08/14 1033) SpO2:  [93 %-100 %] 93 % (08/14 1033) Weight:  [171 lb 1.6 oz (77.6 kg)] 171 lb 1.6 oz (77.6 kg) (08/14 0448) Last BM Date:  (pt is not sure) General:   Pleasant, cooperative in NAD Head:  Normocephalic and atraumatic. Eyes:   No icterus.   Conjunctiva pink. PERRLA. Ears:  Normal auditory acuity. Neck:  Supple; no masses or thyroidomegaly Lungs: Respirations even and unlabored. Lungs clear to auscultation bilaterally.   No wheezes, crackles, or rhonchi.  Heart:  Regular rate and rhythm;  Without murmur, clicks, rubs or gallops Abdomen:  Soft, nondistended, nontender. Normal bowel sounds. No appreciable masses or hepatomegaly.  No rebound or guarding.  Rectal:  Not performed. Msk:  Symmetrical without gross deformities.    Extremities:  Without edema, cyanosis or clubbing. Neurologic:  Alert and oriented x3;  grossly normal neurologically. Skin:  Intact without significant lesions or rashes. Cervical Nodes:  No significant cervical adenopathy. Psych:  Alert and cooperative. Normal affect.  LAB RESULTS:  Recent Labs  05/17/17 1117 05/18/17 0528 05/19/17 1126  WBC 8.5 6.2 5.5  HGB 10.9* 9.3* 9.6*  HCT 33.3* 28.4* 29.5*  PLT 157 138* 145*   BMET  Recent Labs  05/17/17 1117 05/18/17 0528 05/19/17 1126  NA 129* 135 139  K 5.6* 4.5 4.5  CL 96* 107 110  CO2 25 24 25   GLUCOSE 620* 232* 198*  BUN 24* 20 17  CREATININE 2.35* 1.73* 1.63*  CALCIUM 8.7* 7.9* 8.3*   LFT  Recent Labs  05/18/17 0528  PROT 6.1*  ALBUMIN 2.7*  AST 28  ALT 19  ALKPHOS 79  BILITOT 0.9   PT/INR No results for input(s): LABPROT, INR in the last 72 hours.  STUDIES: Ct Abdomen Pelvis Wo Contrast  Result Date: 05/17/2017 CLINICAL  DATA:  [place] last week, continued chest, abdominal, and pelvic pain EXAM: CT CHEST, ABDOMEN AND PELVIS WITHOUT CONTRAST TECHNIQUE: Multidetector CT imaging of the chest, abdomen and pelvis was performed following the standard protocol without IV contrast. Sagittal and coronal MPR images reconstructed from axial data set. Neither oral nor intravenous contrast were administered. COMPARISON:  CT chest 10/12/2013 FINDINGS: CT CHEST FINDINGS Cardiovascular: Atherosclerotic calcifications in coronary arteries. Upper normal caliber of ascending thoracic aorta 3.9 cm transverse image 27. No pericardial effusion. Mediastinum/Nodes: Gaseous distention of esophagus throughout the thorax. Small amount of dependent fluid within thoracic esophagus. Base of cervical region normal appearance. Few normal sized mediastinal lymph nodes without thoracic adenopathy. Lungs/Pleura: Cylindrical bronchiectasis RIGHT upper  and RIGHT lower lobes. Interstitial prominence in the RIGHT upper and RIGHT lower lobes may be postinflammatory, with previous exam demonstrating extensive infiltrates bilaterally. Question tracheal megaly. No acute pulmonary infiltrate or pneumothorax. Small basilar RIGHT hemothorax. Calcified granuloma RIGHT upper lobe. Musculoskeletal: Acute fractures of the lateral RIGHT eighth ninth tenth and eleventh ribs as well as the posterior RIGHT ninth rib and posterolateral RIGHT eighth rib. Associated tiny foci of chest wall emphysema. CT ABDOMEN PELVIS FINDINGS Hepatobiliary: Gallbladder and liver normal appearance Pancreas: Normal appearance Spleen: Small splenule inferior to a normal-appearing spleen Adrenals/Urinary Tract: Suspected LEFT renal cyst 18 x 15 mm image 75. Kidneys otherwise normal appearance. No hydronephrosis, ureteral calcification, or ureteral dilatation. Distended bladder. Stomach/Bowel: Normal appendix. Colon interposition between liver and diaphragm. Stomach and bowel loops otherwise unremarkable for  technique. Vascular/Lymphatic: Atherosclerotic calcifications aorta and iliac arteries. No adenopathy. Reproductive: Mild prostatic enlargement. Seminal vessels unremarkable. Other: Tiny umbilical hernia containing fat. No free air or free fluid. Musculoskeletal: Question old healed fracture of the posterior LEFT 11th rib. No acute fractures. IMPRESSION: Multiple lower RIGHT rib fractures with minimal hemothorax at inferior RIGHT chest. Bronchiectasis RIGHT upper lobe and RIGHT lower lobe. No acute intrathoracic, intra-abdominal or intrapelvic abnormalities. Aortic Atherosclerosis (ICD10-I70.0). Electronically Signed   By: Lavonia Dana M.D.   On: 05/17/2017 13:31   Dg Chest 2 View  Result Date: 05/18/2017 CLINICAL DATA:  Status post fall 3 days ago sustaining multiple right rib fractures. Persistent chest discomfort. History of diabetes, community-acquired pneumonia, former smoker. EXAM: CHEST  2 VIEW COMPARISON:  Chest x-ray and chest CT scan of May 17, 2017 FINDINGS: Hypoinflation of the right lung is more conspicuous today. Multiple right lateral rib fractures are visible. On the left the lung is well-expanded. There are coarse lung markings at the left lung base more conspicuous today. The heart is normal in size. The pulmonary vascularity is not engorged. There is a stable 3 mm metallic density that projects along the base of the neck seen to lie adjacent to the left thyroid lobe on the previous CT scan. IMPRESSION: Worsening of hypoinflation on the right. No pneumothorax or pneumomediastinum or significant pleural effusion. Known multiple lower right lateral rib fractures. Probable bibasilar subsegmental atelectasis. Electronically Signed   By: David  Martinique M.D.   On: 05/18/2017 07:45   Ct Chest Wo Contrast  Result Date: 05/17/2017 CLINICAL DATA:  [place] last week, continued chest, abdominal, and pelvic pain EXAM: CT CHEST, ABDOMEN AND PELVIS WITHOUT CONTRAST TECHNIQUE: Multidetector CT imaging of the  chest, abdomen and pelvis was performed following the standard protocol without IV contrast. Sagittal and coronal MPR images reconstructed from axial data set. Neither oral nor intravenous contrast were administered. COMPARISON:  CT chest 10/12/2013 FINDINGS: CT CHEST FINDINGS Cardiovascular: Atherosclerotic calcifications in coronary arteries. Upper normal caliber of ascending thoracic aorta 3.9 cm transverse image 27. No pericardial effusion. Mediastinum/Nodes: Gaseous distention of esophagus throughout the thorax. Small amount of dependent fluid within thoracic esophagus. Base of cervical region normal appearance. Few normal sized mediastinal lymph nodes without thoracic adenopathy. Lungs/Pleura: Cylindrical bronchiectasis RIGHT upper and RIGHT lower lobes. Interstitial prominence in the RIGHT upper and RIGHT lower lobes may be postinflammatory, with previous exam demonstrating extensive infiltrates bilaterally. Question tracheal megaly. No acute pulmonary infiltrate or pneumothorax. Small basilar RIGHT hemothorax. Calcified granuloma RIGHT upper lobe. Musculoskeletal: Acute fractures of the lateral RIGHT eighth ninth tenth and eleventh ribs as well as the posterior RIGHT ninth rib and posterolateral RIGHT eighth rib. Associated tiny foci  of chest wall emphysema. CT ABDOMEN PELVIS FINDINGS Hepatobiliary: Gallbladder and liver normal appearance Pancreas: Normal appearance Spleen: Small splenule inferior to a normal-appearing spleen Adrenals/Urinary Tract: Suspected LEFT renal cyst 18 x 15 mm image 75. Kidneys otherwise normal appearance. No hydronephrosis, ureteral calcification, or ureteral dilatation. Distended bladder. Stomach/Bowel: Normal appendix. Colon interposition between liver and diaphragm. Stomach and bowel loops otherwise unremarkable for technique. Vascular/Lymphatic: Atherosclerotic calcifications aorta and iliac arteries. No adenopathy. Reproductive: Mild prostatic enlargement. Seminal vessels  unremarkable. Other: Tiny umbilical hernia containing fat. No free air or free fluid. Musculoskeletal: Question old healed fracture of the posterior LEFT 11th rib. No acute fractures. IMPRESSION: Multiple lower RIGHT rib fractures with minimal hemothorax at inferior RIGHT chest. Bronchiectasis RIGHT upper lobe and RIGHT lower lobe. No acute intrathoracic, intra-abdominal or intrapelvic abnormalities. Aortic Atherosclerosis (ICD10-I70.0). Electronically Signed   By: Lavonia Dana M.D.   On: 05/17/2017 13:31   Dg Chest Port 1 View  Result Date: 05/19/2017 CLINICAL DATA:  Right hemothorax. EXAM: PORTABLE CHEST 1 VIEW COMPARISON:  05/18/2017.  CT 05/17/2017. FINDINGS: Heart size stable. Stable mild scratched it mild right base subsegmental atelectasis . Small right pleural effusion . No pneumothorax. Right lower rib fractures again noted. Tiny metallic density again in the neck. IMPRESSION: 1. Mild right base subsegmental atelectasis. Small stable right pleural effusion. No pneumothorax. 2.  Right lower rib fractures again noted . Electronically Signed   By: Marcello Moores  Register   On: 05/19/2017 06:25      Impression / Plan:   Cameron Adams is a 66 y.o. y/o male with With a history of long-standing dysphagia. The patient was seen by dietary advice supplementation and chopping his food to cauterize pieces. The patient will follow-up with me as an outpatient if he wants to continue finding out why he has dysphagia. The patient has been given my card and will contact me as an outpatient since this is not an urgent matter. I will sign off.  Please call if any further GI concerns or questions.  We would like to thank you for the opportunity to participate in the care of Cameron Adams.   Thank you for involving me in the care of this patient.      LOS: 2 days   Cameron Lame, MD  05/19/2017, 12:36 PM   Note: This dictation was prepared with Dragon dictation along with smaller phrase technology. Any  transcriptional errors that result from this process are unintentional.

## 2017-05-19 NOTE — Progress Notes (Signed)
Nutrition Education Note   RD consulted for nutrition education regarding diabetes.   Lab Results  Component Value Date   HGBA1C 14.6 (H) 09/15/2016    RD provided "Nutrition and Type II Diabetes" handout from the Academy of [employer]etics. Discussed different food groups and their effects on blood sugar, emphasizing carbohydrate-containing foods. Provided list of carbohydrates and recommended serving sizes of common foods.  Discussed importance of controlled and consistent carbohydrate intake throughout the day. Provided examples of ways to balance meals/snacks and encouraged intake of high-fiber, whole grain complex carbohydrates. Teach back method used.  Expect fair compliance.  Body mass index is 23.86 kg/m. Pt meets criteria for normal weight  based on current BMI.  Current diet order is DYS 2/ thin, patient is consuming approximately 100% of meals at this time. Labs and medications reviewed. No further nutrition interventions warranted at this time. RD contact information provided. If additional nutrition issues arise, please re-consult RD.  Koleen Distance MS, RD, LDN Pager #- [phone] After Hours Pager: [phone]

## 2017-05-19 NOTE — Progress Notes (Signed)
SURGICAL PROGRESS NOTE (cpt [phone])  Hospital Day(s): 2.   Post op day(s):  Marland Kitchen   Interval History: Patient seen and examined, no acute events or new complaints overnight. Patient reports well-controlled and mild Right lower chest pain associated with Right rib fractures (8 - 11) and using incentive spirometer regularly without difficulty, denies SOB, CP (no chest tightness), or fever/chills.  Review of Systems:  Constitutional: denies fever, chills  HEENT: denies cough or congestion  Respiratory: denies any shortness of breath  Cardiovascular: denies chest pain or palpitations  Gastrointestinal: denies abdominal pain, N/V, or diarrhea Genitourinary: denies burning with urination or urinary frequency Musculoskeletal: denies decreased motor or sensation, Right lower chest wall pain as per interval history Integumentary: denies any other rashes or skin discolorations Neurological: denies HA or vision/hearing changes   Vital signs in last 24 hours: [min-max] current  Temp:  [98.1 F (36.7 C)-98.6 F (37 C)] 98.6 F (37 C) (08/14 0448) Pulse Rate:  [74-81] 74 (08/14 0448) Resp:  [16] 16 (08/14 0448) BP: (108-135)/(47-67) 135/67 (08/14 0448) SpO2:  [94 %-100 %] 98 % (08/14 0448) Weight:  [171 lb 1.6 oz (77.6 kg)] 171 lb 1.6 oz (77.6 kg) (08/14 0448)     Height: 5\' 11"  (180.3 cm) Weight: 171 lb 1.6 oz (77.6 kg) BMI (Calculated): 22.8   Intake/Output this shift:  No intake/output data recorded.   Intake/Output last 2 shifts:  @IOLAST2SHIFTS @   Physical Exam:  Constitutional: alert, cooperative and no distress  HENT: normocephalic without obvious abnormality  Eyes: PERRL, EOM's grossly intact and symmetric  Neuro: CN II - XII grossly intact and symmetric without deficit  Respiratory: breathing non-labored at rest  Cardiovascular: regular rate and sinus rhythm  Gastrointestinal: soft, non-tender, and non-distended  Musculoskeletal: UE and LE FROM, no edema or wounds, motor and  sensation grossly intact, NT   Labs:  CBC Latest Ref Rng & Units 05/18/2017 05/17/2017 09/15/2016  WBC 3.8 - 10.6 K/uL 6.2 8.5 -  Hemoglobin 13.0 - 18.0 g/dL 9.3(L) 10.9(L) 11.2(L)  Hematocrit 40.0 - 52.0 % 28.4(L) 33.3(L) -  Platelets 150 - 440 K/uL 138(L) 157 -   CMP Latest Ref Rng & Units 05/18/2017 05/17/2017 09/16/2016  Glucose 65 - 99 mg/dL 232(H) 620(HH) 128(H)  BUN 6 - 20 mg/dL 20 24(H) 19  Creatinine 0.61 - 1.24 mg/dL 1.73(H) 2.35(H) 1.46(H)  Sodium 135 - 145 mmol/L 135 129(L) 138  Potassium 3.5 - 5.1 mmol/L 4.5 5.6(H) 3.7  Chloride 101 - 111 mmol/L 107 96(L) 105  CO2 22 - 32 mmol/L 24 25 28   Calcium 8.9 - 10.3 mg/dL 7.9(L) 8.7(L) 8.5(L)  Total Protein 6.5 - 8.1 g/dL 6.1(L) - -  Total Bilirubin 0.3 - 1.2 mg/dL 0.9 - -  Alkaline Phos 38 - 126 U/L 79 - -  AST 15 - 41 U/L 28 - -  ALT 17 - 63 U/L 19 - -    Imaging studies:  Chest X-ray (05/19/2017) Mild right base subsegmental atelectasis. Small stable right pleural effusion. No pneumothorax. Right lower rib fractures again noted.  Assessment/Plan: (ICD-10's: J94.2, S22.41XA) 66 y.o. male with small Right hemothorax and multiple Right rib fractures (8 - 11) s/p fall from standing, complicated by likely acute dilutional anemia and by pertinent comorbidities including poorly controlled diabetes mellitus, HTN, PAD, CKD (stage 3), Left eye blindness, and thyroid dysfunction (not otherwise specified).              - pain control prn              -  incentive spirometry             - no need for additional daily CXR  - no surgical intervention indicated, will signoff. please call if questions.             - discharge planning and medical management of comorbidities per medical team             - DVT prophylaxis, ambulation encouraged  All of the above findings and recommendations were discussed with the patient and the medical team, and all of patient's questions were answered to his expressed satisfaction.  Thank you for the  opportunity to participate in this patient's care.  -- Marilynne Drivers Rosana Hoes, MD, [place]: [facility] - Partnering for exceptional care. Office: [phone]

## 2017-05-19 NOTE — Evaluation (Signed)
Physical Therapy Evaluation Patient Details Name: Cameron Adams MRN: [id number] DOB: 28-Mar-1951 Today's Date: 05/19/2017   History of Present Illness  66 y.o. male has a past medical history significant for DM, HTN, and CKD who fell down some steps a few days ago who presents with worsening rib and pleuritic chest pain.  Noted to have multiple rib fractures with hemothorax noted on CT. N  Clinical Impression  Pt is very unsteady/unsafe with ambulation even with a walker (apparently he does not use an AD and is regularly out of the home).  He struggled to maintain balance w/o significant UE use and despite a lot of cuing, guidance and light assist during gait training he was not safe.  He feels he will feel and do better tomorrow and hopes to be able to go home but realizes that per today's effort he is not safe to do so.     Follow Up Recommendations SNF (Pt hoping to improve enough to go home safely)    Equipment Recommendations  Rolling walker with 5" wheels    Recommendations for Other Services       Precautions / Restrictions Precautions Precautions: Fall Restrictions Weight Bearing Restrictions: No      Mobility  Bed Mobility Overal bed mobility: Modified Independent             General bed mobility comments: Pt was able to get himself up to EOB, but was reliant on rails and needed some cuing  Transfers Overall transfer level: Modified independent Equipment used: Rolling walker (2 wheeled)             General transfer comment: Pt initially felt as though he could stand w/o AD, pt did poorly needing to lean back on the bed and ultimately needing PT's hands to hold and then a walker to maintain upright balance  Ambulation/Gait Ambulation/Gait assistance: Min assist Ambulation Distance (Feet): 55 Feet Assistive device: Rolling walker (2 wheeled)       General Gait Details: Pt showed good effort with ambulation but was very reliant on the walker (no AD at  baseline) and was unsteady with changes in direction.  Despite a lot of cuing he regularly had his feet outside the walker (R more than L) and even ran wheels into unmoving obstacles numerous times.  Pt not safe without direct assist.   Stairs            Wheelchair Mobility    Modified Rankin (Stroke Patients Only)       Balance Overall balance assessment: Needs assistance Sitting-balance support: No upper extremity supported Sitting balance-Leahy Scale: Good     Standing balance support: Bilateral upper extremity supported Standing balance-Leahy Scale: Poor Standing balance comment: Pt unsteady and inconsistent with stability with all static and dynamic standing balance acts.                              Pertinent Vitals/Pain      Home Living Family/patient expects to be discharged to:: Private residence Living Arrangements: Alone Available Help at Discharge:  (Appears that he has a daughter that helps regularly) Type of Home: Apartment Home Access: Stairs to enter Entrance Stairs-Rails: Left Entrance Stairs-Number of Steps: [place]: One level Home Equipment: Walker - 2 wheels;Grab bars - tub/shower      Prior Function Level of Independence: Independent         Comments: Pt states that he does not use a  walker, can do some limited driving, is out of the home with some regularity but that daughter does grocery shopping, errands     Hand Dominance        Extremity/Trunk Assessment   Upper Extremity Assessment Upper Extremity Assessment: Generalized weakness    Lower Extremity Assessment Lower Extremity Assessment: Generalized weakness       Communication   Communication: No difficulties  Cognition Arousal/Alertness: Lethargic Behavior During Therapy: WFL for tasks assessed/performed;Impulsive Overall Cognitive Status: Within Functional Limits for tasks assessed                                        General  Comments      Exercises     Assessment/Plan    PT Assessment Patient needs continued PT services  PT Problem List Decreased strength;Decreased activity tolerance;Decreased balance;Decreased mobility;Decreased range of motion;Decreased coordination;Decreased knowledge of use of DME;Decreased safety awareness;Decreased knowledge of precautions       PT Treatment Interventions DME instruction;Gait training;Stair training;Functional mobility training;Therapeutic activities;Therapeutic exercise;Balance training;Cognitive remediation;Patient/family education    PT Goals (Current goals can be found in the Care Plan section)  Acute Rehab PT Goals Patient Stated Goal: go home PT Goal Formulation: With patient Time For Goal Achievement: 06/02/17 Potential to Achieve Goals: Fair    Frequency Min 2X/week   Barriers to discharge        Co-evaluation               AM-PAC PT "6 Clicks" Daily Activity  Outcome Measure Difficulty turning over in bed (including adjusting bedclothes, sheets and blankets)?: A Little Difficulty moving from lying on back to sitting on the side of the bed? : A Little Difficulty sitting down on and standing up from a chair with arms (e.g., wheelchair, bedside commode, etc,.)?: Total Help needed moving to and from a bed to chair (including a wheelchair)?: A Little Help needed walking in hospital room?: A Lot Help needed climbing 3-5 steps with a railing? : A Lot 6 Click Score: 14    End of Session Equipment Utilized During Treatment: Gait belt Activity Tolerance: Patient limited by fatigue Patient left: with chair alarm set;with call bell/phone within reach;with nursing/sitter in room Nurse Communication: Mobility status PT Visit Diagnosis: Muscle weakness (generalized) (M62.81);Difficulty in walking, not elsewhere classified (R26.2)    Time: 4536-4680 PT Time Calculation (min) (ACUTE ONLY): 26 min   Charges:   PT Evaluation $PT Eval Low Complexity:  1 Low PT Treatments $Gait Training: 8-22 mins   PT G Codes:   PT G-Codes **NOT FOR INPATIENT CLASS** Functional Assessment Tool Used: AM-PAC 6 Clicks Basic Mobility Functional Limitation: Mobility: Walking and moving around Mobility: Walking and Moving Around Current Status (H2122): At least 40 percent but less than 60 percent impaired, limited or restricted Mobility: Walking and Moving Around Goal Status [phone]): At least 1 percent but less than 20 percent impaired, limited or restricted    Kreg Shropshire, DPT 05/19/2017, 4:23 PM

## 2017-05-19 NOTE — Progress Notes (Signed)
Inpatient Diabetes Program Recommendations  AACE/ADA: New Consensus Statement on Inpatient Glycemic Control (2015)  Target Ranges:  Prepandial:   less than 140 mg/dL      Peak postprandial:   less than 180 mg/dL (1-2 hours)      Critically ill patients:  140 - 180 mg/dL   Results for Cameron Adams, Cameron Adams (MRN [id number]) as of 05/19/2017 10:48  Ref. Range 05/18/2017 07:45 05/18/2017 11:42 05/18/2017 16:44 05/18/2017 21:35  Glucose-Capillary Latest Ref Range: 65 - 99 mg/dL 181 (H) 230 (H) 225 (H) 279 (H)   Results for Cameron Adams, Cameron Adams (MRN [id number]) as of 05/19/2017 10:48  Ref. Range 05/19/2017 08:06  Glucose-Capillary Latest Ref Range: 65 - 99 mg/dL 180 (H)    Home DM Meds: Levemir 30 units QHS                              Novolog 5 units TID  Current Orders: Levemir 30 units QHS                            Novolog Sensitive Correction Scale/ SSI (0-9 units) TID AC        MD- CBGs elevated today.  Patient eating 100% meals today.  Please consider the following:  1. Increase Levemir to 32 units QHS  2. Start Novolog Meal Coverage: Novolog 4 units TID with meals to start (hold if pt eats <50% of meal)     --Will follow patient during hospitalization--  Wyn Quaker RN, MSN, CDE Diabetes Coordinator Inpatient Glycemic Control Team Team Pager: [phone] (8a-5p)
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
## 2017-05-19 NOTE — Progress Notes (Addendum)
Initial Nutrition Assessment  DOCUMENTATION CODES:   Not applicable  INTERVENTION:   Ensure Enlive po TID, each supplement provides 350 kcal and 20 grams of protein  MVI  NUTRITION DIAGNOSIS:   Swallowing difficulty related to dysphagia as evidenced by per patient/family report.  GOAL:   Patient will meet greater than or equal to 90% of their needs  MONITOR:   PO intake, Supplement acceptance, Labs, Weight trends  REASON FOR ASSESSMENT:   Consult Assessment of nutrition requirement/status  ASSESSMENT:    66 y.o. male has a past medical history significant for DM, HTN, and CKD who fell down some steps 2 days ago who presents to ER with worsening rib and pleuritic chest pain. In ER, pt was noted to have multiple rib fractures with hemothorax noted on CT. He is in severe pain despite IV morphine and Fentanyl. Initial blood sugar>600    Met with pt in room today. Pt reports that he has been having difficulty with foods getting stuck in his throat for the past two years. Pt reports that food will get stuck and he will have to regurgitate the food back up. Pt also reports rotten smells and regurgitation of rotten foods. Pt reports that he has the most trouble with meats and bread. Pt reports that he also has poor dentition which makes it difficult for him to chew foods thoroughly. Pt also reports severe reflux and spitting up acid. Pt reports a 35lb weight loss over the past two years; however, per chart, pt appears to be weight stable. Pt s/p CSE on 8/13 and approved for a Dys 2/thin liquid diet. Pt with moderate muscle wasting that RD suspects is r/t his inability to eat meats and his poorly controlled DM. Pt was educated today regarding a diabetic diet and was educated regarding following a DYS 2 diet. Pt advised to chop foods in 1/4 inch pieces and to soak meats and breads in gravy to moisten them. Pt also advised to chew foods thoroughly as much as possible. Pt should not avoid meats  as this is a significant source of iron and B-12. RD encouraged pt to start on liquid protein supplements at home such as Boost, Ensure, etc. Per MD note, pt with chronic diarrhea, was seen by GI at [facility] and started on pancreatic enzymes and liquid foods. GI consult. Pt currently eating 100% of meals. RD will order Ensure.   Medications reviewed and include: aspirin, colace, insulin, synthroid, creon, protonix, hydrocodone   Labs reviewed: creat 1.73(H), Ca 7.9(L), adj. 8.94 wnl, alb 2.7(L) Hgb 9.3(L), Hct 28.4(L) cbgs- 620, 232 x 24 hrs   Nutrition-Focused physical exam completed. Findings are no fat depletion, moderate muscle depletion over entire body, and no edema.   Diet Order:  DIET DYS 2 Room service appropriate? Yes with Assist; Fluid consistency: Thin  Skin:  Wound (see comment) (back and toe wounds )  Last BM:  PTA  Height:   Ht Readings from Last 1 Encounters:  05/17/17 5' 11"  (1.803 m)    Weight:   Wt Readings from Last 1 Encounters:  05/19/17 171 lb 1.6 oz (77.6 kg)    Ideal Body Weight:  78.1 kg  BMI:  Body mass index is 23.86 kg/m.  Estimated Nutritional Needs:   Kcal:  2100-2400kcal/day  Protein:  85-101g/day   Fluid:  >2L/day   EDUCATION NEEDS:   Education needs addressed  Koleen Distance MS, RD, LDN Pager #[phone] After Hours Pager: [phone]

## 2017-05-20 LAB — MISC LABCORP TEST (SEND OUT): LABCORP TEST CODE: 1453

## 2017-05-20 LAB — GLUCOSE, CAPILLARY
GLUCOSE-CAPILLARY: 142 mg/dL — AB (ref 65–99)
Glucose-Capillary: 195 mg/dL — ABNORMAL HIGH (ref 65–99)

## 2017-05-20 MED ORDER — INSULIN GLARGINE 100 UNIT/ML ~~LOC~~ SOLN: 30.0000 [IU] | Freq: Every day | SUBCUTANEOUS | 11 refills | Status: DC

## 2017-05-20 MED ORDER — ENSURE ENLIVE PO LIQD: 237.0000 mL | Freq: Three times a day (TID) | ORAL | 12 refills | Status: DC

## 2017-05-20 MED ORDER — LEVOTHYROXINE SODIUM 200 MCG PO TABS: 200.0000 ug | ORAL_TABLET | Freq: Every day | ORAL | 0 refills | Status: DC

## 2017-05-20 MED ORDER — HYDROCODONE-ACETAMINOPHEN 5-325 MG PO TABS: 1.0000 | ORAL_TABLET | Freq: Four times a day (QID) | ORAL | 0 refills | Status: DC | PRN

## 2017-05-20 MED ORDER — PANTOPRAZOLE SODIUM 40 MG PO PACK: 40.0000 mg | PACK | Freq: Two times a day (BID) | ORAL | 0 refills | Status: DC

## 2017-05-20 MED ORDER — ADULT MULTIVITAMIN LIQUID CH: 15.0000 mL | Freq: Every day | ORAL | 0 refills | Status: DC
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
## 2017-05-20 NOTE — Progress Notes (Signed)
Pnt is resting and appears comfortable. Pnt 02 sats drop to 84-85 on room air. When pnt awakened 02 sats return to >93%. Applied 02 05L [place] overnight while pnt sleeping sats remained 92% or greater. Pnt has slept this evening and appeared comfortable. No issues or concerns. Will continue to monitor and assess.

## 2017-05-20 NOTE — Care Management (Signed)
Patient admitted for uncontrolled DM.  Patient sustained a fall at home which resulted in broken ribs.  Patient lives at home alone.  States that 3 daughters lives locally for support.  One of his daughters checks on him daily.  PCP [place].  PCP [facility].  PT has assessed patient recommend SNF.  Patient declined SNF.  Patient was in agreement to home health services.  Patient was provided home health agency preference.  Patient does not have preference of agency.  Referral made to [place] with [facility].  [place] delivered to room by Corene Cornea from [facility] prior to discharge.  RNCM signing off.

## 2017-05-20 NOTE — Progress Notes (Addendum)
Patient discharge teaching given, including activity, diet, follow-up appoints, and medications. Patient verbalized understanding of all discharge instructions. IV access was d/c'd. Vitals are stable. Skin is intact except as charted in most recent assessments. Pt to be escorted out by NT. RN offered to call pt.'s daughter or neighbor to come and get pt. However, pt wants to drive home since he drove himself here.   Cameron Adams [employer]

## 2017-05-20 NOTE — Care Management Important Message (Signed)
Important Message  Patient Details  Name: Cameron Adams MRN: [id number] Date of Birth: 1950-12-08   Medicare Important Message Given:  Yes    Beverly Sessions, RN 05/20/2017, 11:34 AM

## 2017-05-20 NOTE — Clinical Social Work Note (Signed)
[facility] offered to accept patient for STR. Patient has now made the decision to return home. The physician has spoken with patient regarding this and will order home health. RN CM is aware and will arrange the home health. Shela Leff MSW,LCSW [phone]

## 2017-05-20 NOTE — Discharge Instructions (Signed)

## 2017-06-10 ENCOUNTER — Encounter: Payer: Self-pay | Admitting: Gastroenterology

## 2017-06-10 ENCOUNTER — Ambulatory Visit: Payer: Medicare Other | Admitting: Gastroenterology

## 2017-06-10 ENCOUNTER — Other Ambulatory Visit: Payer: Self-pay

## 2017-06-16 ENCOUNTER — Inpatient Hospital Stay
Admission: EM | Admit: 2017-06-16 | Discharge: 2017-06-19 | DRG: 641 | Disposition: A | Discharge status: Home Health | Payer: Medicare Other | Attending: Internal Medicine | Admitting: Internal Medicine

## 2017-06-16 DIAGNOSIS — Z87891 Personal history of nicotine dependence: Secondary | ICD-10-CM

## 2017-06-16 DIAGNOSIS — E875 Hyperkalemia: Secondary | ICD-10-CM | POA: Diagnosis not present

## 2017-06-16 DIAGNOSIS — N183 Chronic kidney disease, stage 3 (moderate): Secondary | ICD-10-CM | POA: Diagnosis present

## 2017-06-16 DIAGNOSIS — Z79899 Other long term (current) drug therapy: Secondary | ICD-10-CM | POA: Diagnosis not present

## 2017-06-16 DIAGNOSIS — I959 Hypotension, unspecified: Secondary | ICD-10-CM | POA: Diagnosis present

## 2017-06-16 DIAGNOSIS — E1122 Type 2 diabetes mellitus with diabetic chronic kidney disease: Secondary | ICD-10-CM | POA: Diagnosis present

## 2017-06-16 DIAGNOSIS — I129 Hypertensive chronic kidney disease with stage 1 through stage 4 chronic kidney disease, or unspecified chronic kidney disease: Secondary | ICD-10-CM | POA: Diagnosis present

## 2017-06-16 DIAGNOSIS — Z8249 Family history of ischemic heart disease and other diseases of the circulatory system: Secondary | ICD-10-CM | POA: Diagnosis not present

## 2017-06-16 DIAGNOSIS — R1310 Dysphagia, unspecified: Secondary | ICD-10-CM | POA: Diagnosis present

## 2017-06-16 DIAGNOSIS — N179 Acute kidney failure, unspecified: Secondary | ICD-10-CM | POA: Diagnosis present

## 2017-06-16 DIAGNOSIS — E11649 Type 2 diabetes mellitus with hypoglycemia without coma: Secondary | ICD-10-CM | POA: Diagnosis present

## 2017-06-16 DIAGNOSIS — H5462 Unqualified visual loss, left eye, normal vision right eye: Secondary | ICD-10-CM | POA: Diagnosis present

## 2017-06-16 DIAGNOSIS — Z794 Long term (current) use of insulin: Secondary | ICD-10-CM

## 2017-06-16 DIAGNOSIS — Z7982 Long term (current) use of aspirin: Secondary | ICD-10-CM

## 2017-06-16 DIAGNOSIS — R197 Diarrhea, unspecified: Secondary | ICD-10-CM

## 2017-06-16 LAB — CBC
HEMATOCRIT: 32.8 % — AB (ref 40.0–52.0)
HEMOGLOBIN: 10.9 g/dL — AB (ref 13.0–18.0)
MCH: 26.9 pg (ref 26.0–34.0)
MCHC: 33.1 g/dL (ref 32.0–36.0)
MCV: 81.3 fL (ref 80.0–100.0)
Platelets: 164 10*3/uL (ref 150–440)
RBC: 4.04 MIL/uL — ABNORMAL LOW (ref 4.40–5.90)
RDW: 13.9 % (ref 11.5–14.5)
WBC: 5.3 10*3/uL (ref 3.8–10.6)

## 2017-06-16 LAB — COMPREHENSIVE METABOLIC PANEL
ALBUMIN: 3.5 g/dL (ref 3.5–5.0)
ALT: 15 U/L — ABNORMAL LOW (ref 17–63)
ANION GAP: 7 (ref 5–15)
AST: 26 U/L (ref 15–41)
Alkaline Phosphatase: 140 U/L — ABNORMAL HIGH (ref 38–126)
BILIRUBIN TOTAL: 0.6 mg/dL (ref 0.3–1.2)
BUN: 36 mg/dL — AB (ref 6–20)
CHLORIDE: 103 mmol/L (ref 101–111)
CO2: 21 mmol/L — ABNORMAL LOW (ref 22–32)
Calcium: 8.6 mg/dL — ABNORMAL LOW (ref 8.9–10.3)
Creatinine, Ser: 2.05 mg/dL — ABNORMAL HIGH (ref 0.61–1.24)
GFR calc Af Amer: 37 mL/min — ABNORMAL LOW (ref >60)
GFR calc non Af Amer: 32 mL/min — ABNORMAL LOW (ref >60)
GLUCOSE: 409 mg/dL — AB (ref 65–99)
POTASSIUM: 6.4 mmol/L — AB (ref 3.5–5.1)
SODIUM: 131 mmol/L — AB (ref 135–145)
TOTAL PROTEIN: 6.6 g/dL (ref 6.5–8.1)

## 2017-06-16 LAB — GLUCOSE, CAPILLARY: GLUCOSE-CAPILLARY: 182 mg/dL — AB (ref 65–99)

## 2017-06-16 LAB — URINALYSIS, COMPLETE (UACMP) WITH MICROSCOPIC
Bilirubin Urine: NEGATIVE
HGB URINE DIPSTICK: NEGATIVE
Ketones, ur: NEGATIVE mg/dL
LEUKOCYTES UA: NEGATIVE
NITRITE: NEGATIVE
PH: 5 (ref 5.0–8.0)
PROTEIN: NEGATIVE mg/dL
SPECIFIC GRAVITY, URINE: 1.014 (ref 1.005–1.030)

## 2017-06-16 LAB — LIPASE, BLOOD: LIPASE: 42 U/L (ref 11–51)

## 2017-06-16 LAB — POTASSIUM: POTASSIUM: 5.4 mmol/L — AB (ref 3.5–5.1)

## 2017-06-16 MED ORDER — LEVOTHYROXINE SODIUM 100 MCG PO TABS
200.0000 ug | ORAL_TABLET | Freq: Every day | ORAL | Status: DC
  Administered 2017-06-17 – 2017-06-19 (×3): 200 ug via ORAL
  Filled 2017-06-16 (×3): qty 2

## 2017-06-16 MED ORDER — ATORVASTATIN CALCIUM 20 MG PO TABS
40.0000 mg | ORAL_TABLET | Freq: Every day | ORAL | Status: DC
  Administered 2017-06-16 – 2017-06-18 (×3): 40 mg via ORAL
  Filled 2017-06-16 (×3): qty 2

## 2017-06-16 MED ORDER — CITALOPRAM HYDROBROMIDE 20 MG PO TABS
20.0000 mg | ORAL_TABLET | Freq: Every day | ORAL | Status: DC
  Administered 2017-06-17 – 2017-06-19 (×3): 20 mg via ORAL
  Filled 2017-06-16 (×3): qty 1

## 2017-06-16 MED ORDER — PATIROMER SORBITEX CALCIUM 8.4 G PO PACK
8.4000 g | PACK | Freq: Once | ORAL | Status: DC
  Filled 2017-06-16: qty 4

## 2017-06-16 MED ORDER — HEPARIN SODIUM (PORCINE) 5000 UNIT/ML IJ SOLN
5000.0000 [IU] | Freq: Three times a day (TID) | INTRAMUSCULAR | Status: DC
  Administered 2017-06-16 – 2017-06-19 (×8): 5000 [IU] via SUBCUTANEOUS
  Filled 2017-06-16 (×8): qty 1

## 2017-06-16 MED ORDER — HYDROXYZINE HCL 10 MG PO TABS
10.0000 mg | ORAL_TABLET | Freq: Three times a day (TID) | ORAL | Status: DC | PRN
  Filled 2017-06-16: qty 1

## 2017-06-16 MED ORDER — INSULIN ASPART 100 UNIT/ML ~~LOC~~ SOLN
0.0000 [IU] | Freq: Three times a day (TID) | SUBCUTANEOUS | Status: DC
  Administered 2017-06-17: 3 [IU] via SUBCUTANEOUS
  Administered 2017-06-17: 1 [IU] via SUBCUTANEOUS
  Administered 2017-06-18: 2 [IU] via SUBCUTANEOUS
  Administered 2017-06-18: 5 [IU] via SUBCUTANEOUS
  Administered 2017-06-18 – 2017-06-19 (×2): 3 [IU] via SUBCUTANEOUS
  Administered 2017-06-19: 5 [IU] via SUBCUTANEOUS
  Filled 2017-06-16 (×7): qty 1

## 2017-06-16 MED ORDER — LORAZEPAM 2 MG/ML IJ SOLN
0.5000 mg | Freq: Once | INTRAMUSCULAR | Status: AC
  Administered 2017-06-16: 0.5 mg via INTRAVENOUS

## 2017-06-16 MED ORDER — SODIUM CHLORIDE 0.9 % IV SOLN
INTRAVENOUS | Status: DC
Start: 2017-06-16 — End: 2017-06-19
  Administered 2017-06-17 – 2017-06-19 (×3): via INTRAVENOUS

## 2017-06-16 MED ORDER — SODIUM POLYSTYRENE SULFONATE 15 GM/60ML PO SUSP: 30.0000 g | Freq: Once | ORAL | Status: DC

## 2017-06-16 MED ORDER — AMLODIPINE BESYLATE 10 MG PO TABS
10.0000 mg | ORAL_TABLET | Freq: Every day | ORAL | Status: DC
  Administered 2017-06-17: 10 mg via ORAL
  Filled 2017-06-16 (×3): qty 1

## 2017-06-16 MED ORDER — CALCIUM GLUCONATE 10 % IV SOLN
1.0000 g | Freq: Once | INTRAVENOUS | Status: AC
  Administered 2017-06-16: 1 g via INTRAVENOUS
  Filled 2017-06-16: qty 10

## 2017-06-16 MED ORDER — DOCUSATE SODIUM 100 MG PO CAPS: 100.0000 mg | ORAL_CAPSULE | Freq: Two times a day (BID) | ORAL | Status: DC | PRN

## 2017-06-16 MED ORDER — PANCRELIPASE (LIP-PROT-AMYL) 12000-38000 UNITS PO CPEP
24000.0000 [IU] | ORAL_CAPSULE | Freq: Three times a day (TID) | ORAL | Status: DC
  Administered 2017-06-17 – 2017-06-19 (×8): 24000 [IU] via ORAL
  Filled 2017-06-16 (×8): qty 2

## 2017-06-16 MED ORDER — ASPIRIN EC 81 MG PO TBEC
81.0000 mg | DELAYED_RELEASE_TABLET | Freq: Every day | ORAL | Status: DC
  Administered 2017-06-17 – 2017-06-19 (×3): 81 mg via ORAL
  Filled 2017-06-16 (×3): qty 1

## 2017-06-16 MED ORDER — SODIUM BICARBONATE 8.4 % IV SOLN
50.0000 meq | Freq: Once | INTRAVENOUS | Status: AC
  Administered 2017-06-16: 50 meq via INTRAVENOUS
  Filled 2017-06-16: qty 50

## 2017-06-16 MED ORDER — INSULIN ASPART 100 UNIT/ML ~~LOC~~ SOLN
6.0000 [IU] | Freq: Once | SUBCUTANEOUS | Status: AC
  Administered 2017-06-16: 6 [IU] via INTRAVENOUS
  Filled 2017-06-16: qty 1

## 2017-06-16 MED ORDER — SODIUM CHLORIDE 0.9 % IV BOLUS (SEPSIS)
1000.0000 mL | Freq: Once | INTRAVENOUS | Status: AC
Start: 2017-06-16 — End: 2017-06-16
  Administered 2017-06-16: 1000 mL via INTRAVENOUS

## 2017-06-16 MED ORDER — HYDROCODONE-ACETAMINOPHEN 5-325 MG PO TABS
1.0000 | ORAL_TABLET | Freq: Four times a day (QID) | ORAL | Status: DC | PRN
  Administered 2017-06-16: 1 via ORAL
  Filled 2017-06-16: qty 1

## 2017-06-16 MED ORDER — GABAPENTIN 300 MG PO CAPS
300.0000 mg | ORAL_CAPSULE | Freq: Every day | ORAL | Status: DC
  Administered 2017-06-16 – 2017-06-18 (×3): 300 mg via ORAL
  Filled 2017-06-16 (×3): qty 1

## 2017-06-16 MED ORDER — LORAZEPAM 2 MG/ML IJ SOLN
INTRAMUSCULAR | Status: AC
  Filled 2017-06-16: qty 1

## 2017-06-16 MED ORDER — INSULIN ASPART 100 UNIT/ML ~~LOC~~ SOLN
5.0000 [IU] | Freq: Three times a day (TID) | SUBCUTANEOUS | Status: DC
  Administered 2017-06-17 (×2): 5 [IU] via SUBCUTANEOUS
  Filled 2017-06-16 (×2): qty 1

## 2017-06-16 NOTE — H&P (Signed)
[place] [facility] [place] NAME: Cameron Adams    MR#:  [id number]  DATE OF BIRTH:  11-Nov-1950  DATE OF ADMISSION:  06/16/2017  PRIMARY CARE PHYSICIAN: Patient, No Pcp Per   REQUESTING/REFERRING PHYSICIAN: Rifenbark  CHIEF COMPLAINT:   Chief Complaint  Patient presents with  . Hypotension  . Diarrhea    HISTORY OF PRESENT ILLNESS: Cameron Adams  is a 66 y.o. male with a known history of DM, Htn, Thyroid dysfunction, Dysphagia and esophageal dysmotility- have some diarrhea for last 2-3 days and noted hypotensive and dizzi, so sent to ER from office- noted to have renal failure and hyperkalemia.  PAST MEDICAL HISTORY:   Past Medical History:  Diagnosis Date  . Blindness of left eye   . Chronic kidney disease, stage III (moderate)   . Diabetes ([place])   . Hypertension   . Syncope and collapse   . Thyroid dysfunction     PAST SURGICAL HISTORY: Past Surgical History:  Procedure Laterality Date  . 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 PERIPHERAL VASCULAR CATHETERIZATION N/A 09/15/2016   Procedure: Lower Extremity Angiography;  Surgeon: Algernon Huxley, MD;  Location: [facility];  Service: Cardiovascular;  Laterality: N/A;  . THYROID SURGERY      SOCIAL HISTORY:  Social History  Substance Use Topics  . Smoking status: Former Smoker    Packs/day: 0.10    Years: 25.00    Types: Cigarettes    Quit date: 10/31/1978  . Smokeless tobacco: Never Used     Comment: smoked 2-3 cigarettes/day  . Alcohol use No    FAMILY HISTORY:  Family History  Problem Relation Age of Onset  . Kidney failure Mother   . Heart disease Father   . Kidney failure Father   . Heart disease Brother     DRUG ALLERGIES: No Known Allergies  REVIEW OF SYSTEMS:   CONSTITUTIONAL: No fever, fatigue or weakness.  EYES: No blurred or double  vision.  EARS, NOSE, AND THROAT: No tinnitus or ear pain.  RESPIRATORY: No cough, shortness of breath, wheezing or hemoptysis.  CARDIOVASCULAR: No chest pain, orthopnea, edema.  GASTROINTESTINAL: No nausea, vomiting, have some diarrhea , no abdominal pain.  GENITOURINARY: No dysuria, hematuria.  ENDOCRINE: No polyuria, nocturia,  HEMATOLOGY: No anemia, easy bruising or bleeding SKIN: No rash or lesion. MUSCULOSKELETAL: No joint pain or arthritis.   NEUROLOGIC: No tingling, numbness, weakness.  PSYCHIATRY: No anxiety or depression.   MEDICATIONS AT HOME:  Prior to Admission medications   Medication Sig Start Date End Date Taking? Authorizing Provider  aspirin EC 81 MG tablet Take 81 mg by mouth daily.   Yes [provider]  atorvastatin (LIPITOR) 40 MG tablet Take 40 mg by mouth at bedtime.   Yes [provider]  citalopram (CELEXA) 20 MG tablet Take 20 mg by mouth daily.   Yes [provider]  gabapentin (NEURONTIN) 100 MG capsule TAKE 1-3 CAPSULES BY MOUTH BEFORE BEDTIME FOR ARM PAIN 01/14/17  Yes [provider]  HYDROcodone-acetaminophen (NORCO) 5-325 MG tablet Take 1 tablet by mouth every 6 (six) hours as needed for moderate pain. 05/20/17  Yes Vaughan Basta, MD  hydrOXYzine (ATARAX/VISTARIL) 10 MG tablet Take 10 mg by mouth every 8 (eight) hours as needed for itching.    Yes [provider]  insulin aspart (NOVOLOG) 100 UNIT/ML  injection Inject 5 Units into the skin 3 (three) times daily before meals.   Yes [provider]  insulin glargine (LANTUS) 100 UNIT/ML injection Inject 0.3 mLs (30 Units total) into the skin at bedtime. 05/20/17  Yes Vaughan Basta, MD  levothyroxine (SYNTHROID, LEVOTHROID) 200 MCG tablet Take 1 tablet (200 mcg total) by mouth daily before breakfast. 05/21/17  Yes Vaughan Basta, MD  lipase/protease/amylase (CREON) 12000 units CPEP capsule Take 24,000 Units by mouth 3 (three) times daily  with meals.    Yes [provider]  amLODipine (NORVASC) 10 MG tablet Take 1 tablet (10 mg total) by mouth daily. Patient not taking: Reported on 05/17/2017 09/16/16   Theodoro Grist, MD  feeding supplement, ENSURE ENLIVE, (ENSURE ENLIVE) LIQD Take 237 mLs by mouth 3 (three) times daily between meals. 05/20/17   Vaughan Basta, MD  hydroxypropyl methylcellulose / hypromellose (ISOPTO TEARS / GONIOVISC) 2.5 % ophthalmic solution Place 1 drop into both eyes 3 (three) times daily as needed for dry eyes.    [provider]  Multiple Vitamin (MULTIVITAMIN) LIQD Take 15 mLs by mouth daily. Patient not taking: Reported on 06/16/2017 05/21/17   Vaughan Basta, MD  pantoprazole sodium (PROTONIX) 40 mg/20 mL PACK Take 20 mLs (40 mg total) by mouth 2 (two) times daily before a meal. Patient not taking: Reported on 06/16/2017 05/20/17   Vaughan Basta, MD      PHYSICAL EXAMINATION:   VITAL SIGNS: Blood pressure (!) 107/57, pulse 83, temperature 97.7 F (36.5 C), temperature source Oral, resp. rate 18, height 5\' 11"  (1.803 m), weight 77.1 kg (170 lb), SpO2 100 %.  GENERAL:  66 y.o.-year-old patient lying in the bed with no acute distress.  EYES: Pupils equal, round, reactive to light and accommodation. No scleral icterus. Extraocular muscles intact.  HEENT: Head atraumatic, normocephalic. Oropharynx and nasopharynx clear.  NECK:  Supple, no jugular venous distention. No thyroid enlargement, no tenderness.  LUNGS: Normal breath sounds bilaterally, no wheezing, rales,rhonchi or crepitation. No use of accessory muscles of respiration.  CARDIOVASCULAR: S1, S2 normal. No murmurs, rubs, or gallops.  ABDOMEN: Soft, nontender, nondistended. Bowel sounds present. No organomegaly or mass.  EXTREMITIES: No pedal edema, cyanosis, or clubbing.  NEUROLOGIC: Cranial nerves II through XII are intact. Muscle strength 5/5 in all extremities. Sensation intact. Gait not checked.   PSYCHIATRIC: The patient is alert and oriented x 3.  SKIN: No obvious rash, lesion, or ulcer.   LABORATORY PANEL:   CBC  Recent Labs Lab 06/16/17 1431  WBC 5.3  HGB 10.9*  HCT 32.8*  PLT 164  MCV 81.3  MCH 26.9  MCHC 33.1  RDW 13.9   ------------------------------------------------------------------------------------------------------------------  Chemistries   Recent Labs Lab 06/16/17 1431  NA 131*  K 6.4*  CL 103  CO2 21*  GLUCOSE 409*  BUN 36*  CREATININE 2.05*  CALCIUM 8.6*  AST 26  ALT 15*  ALKPHOS 140*  BILITOT 0.6   ------------------------------------------------------------------------------------------------------------------ estimated creatinine clearance is 37.2 mL/min (A) (by C-G formula based on SCr of 2.05 mg/dL (H)). ------------------------------------------------------------------------------------------------------------------ No results for input(s): TSH, T4TOTAL, T3FREE, THYROIDAB in the last 72 hours.  Invalid input(s): FREET3   Coagulation profile No results for input(s): INR, PROTIME in the last 168 hours. ------------------------------------------------------------------------------------------------------------------- No results for input(s): DDIMER in the last 72 hours. -------------------------------------------------------------------------------------------------------------------  Cardiac Enzymes No results for input(s): CKMB, TROPONINI, MYOGLOBIN in the last 168 hours.  Invalid input(s): CK ------------------------------------------------------------------------------------------------------------------ Invalid input(s): POCBNP  ---------------------------------------------------------------------------------------------------------------  Urinalysis    Component Value Date/Time  COLORURINE YELLOW (A) 06/16/2017 1431   APPEARANCEUR CLEAR (A) 06/16/2017 1431   LABSPEC 1.014 06/16/2017 1431   PHURINE 5.0 06/16/2017  1431   GLUCOSEU >=500 (A) 06/16/2017 1431   HGBUR NEGATIVE 06/16/2017 1431   BILIRUBINUR NEGATIVE 06/16/2017 1431   KETONESUR NEGATIVE 06/16/2017 1431   PROTEINUR NEGATIVE 06/16/2017 1431   NITRITE NEGATIVE 06/16/2017 1431   LEUKOCYTESUR NEGATIVE 06/16/2017 1431     RADIOLOGY: No results found.  EKG: Orders placed or performed during the hospital encounter of 06/16/17  . ED EKG  . ED EKG  . EKG 12-Lead  . EKG 12-Lead    IMPRESSION AND PLAN:  * Hyperkalemia   [place], Follow.    Calcium and insulin IV given by ER.    IV fluids    No EKG changes.  * Diarrhea   Recent admission, pt said he had pneumonia also, but I don't see in last d/c summary.   Will check for c diff.  * Ac on CKD stage 3   IV fluids , monitor.  * Hypotension    Pt was sent with this, but his BP is high now.     Cont IV fluids and amlodipine.  * DM   ISS.  * Dysphagia    Have esophageal issues     Barium study done last time, GI suggest to come in office.       All the records are reviewed and case discussed with ED provider. Management plans discussed with the patient, family and they are in agreement.  CODE STATUS: Full. Code Status History    Date Active Date Inactive Code Status Order ID Comments User Context   05/17/2017  3:50 PM 05/20/2017  5:44 PM Full Code [id number]  Idelle Crouch, MD Inpatient   09/13/2016  5:56 PM 09/16/2016  6:56 PM Full Code [id number]  Fritzi Mandes, MD Inpatient       TOTAL TIME TAKING CARE OF THIS PATIENT: 50 minutes.    Vaughan Basta M.D on 06/16/2017   Between 7am to 6pm - Pager - [phone]  After 6pm go to [email or web address] - password [email or web address] [facility]  Office  [phone]  CC: Primary care physician; Patient, No Pcp Per   Note: This dictation was prepared with Dragon dictation along with smaller phrase technology. Any transcriptional errors that result from this process are unintentional.

## 2017-06-16 NOTE — ED Notes (Signed)
Dr. Quentin Cornwall notified of critical K-dur

## 2017-06-16 NOTE — ED Triage Notes (Signed)
Pt sent over from [facility] for hypotension, states diarrhea for several days, reports BP of 70s in the office, BP 117/69 HR 83 upon arrival to ED

## 2017-06-16 NOTE — ED Provider Notes (Signed)
[facility] Emergency Department Provider Note    None    (approximate)  I have reviewed the triage vital signs and the nursing notes.   HISTORY  Chief Complaint Hypotension and Diarrhea    HPI Cameron Adams is a 66 y.o. male presents with several days of nonbloody watery diarrhea associated with lightheadedness and fatigue. Patient was seen by PCP this morning and had systolic blood pressure in the 70s. Was recently admitted for syncopal episode fall with rib fractures. denies any melena or hematochezia. Does endorse decreased appetite. No recent medication changes. His pain or shortness of breath.   Past Medical History:  Diagnosis Date  . Blindness of left eye   . Chronic kidney disease, stage III (moderate)   . Diabetes ([place])   . Hypertension   . Syncope and collapse   . Thyroid dysfunction    Family History  Problem Relation Age of Onset  . Kidney failure Mother   . Heart disease Father   . Kidney failure Father   . Heart disease Brother    Past Surgical History:  Procedure Laterality Date  . 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 PERIPHERAL VASCULAR CATHETERIZATION N/A 09/15/2016   Procedure: Lower Extremity Angiography;  Surgeon: Algernon Huxley, MD;  Location: [facility];  Service: Cardiovascular;  Laterality: N/A;  . THYROID SURGERY     Patient Active Problem List   Diagnosis Date Noted  . Esophageal dysmotilities   . Dysphagia   . Rib fractures 05/17/2017  . Hemothorax 05/17/2017  . Recurrent major depressive disorder, in partial remission ([place]) 05/13/2017  . Pure hypercholesterolemia 05/13/2017  . IDDM (insulin dependent diabetes mellitus) ([place]) 05/13/2017  . Hypothyroidism (acquired) 05/13/2017  . GAD (generalized anxiety disorder) 05/13/2017  . CKD (chronic kidney disease) stage 3, GFR 30-59  ml/min 05/13/2017  . Toe gangrene ([place]) 09/16/2016  . PVD (peripheral vascular disease) ([place]) 09/16/2016  . Uncontrolled diabetes mellitus ([place]) 09/16/2016  . Hyponatremia 09/16/2016  . Hypokalemia 09/16/2016  . Essential hypertension, malignant 09/16/2016  . Abnormal angiogram 09/16/2016  . Gangrene ([place]) 09/13/2016  . Pruritus 03/26/2016  . Orthostatic hypotension 03/26/2016  . 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 Alcohol abuse 03/26/2016  . Hyperglycemia 03/23/2016  . Passive suicidal ideations 09/16/2014  . Dyspnea 11/09/2013  . CAP (community acquired pneumonia) 10/31/2013  . Chest pain 10/31/2013  . Chronic respiratory failure with hypoxia ([place]) 10/31/2013  . 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 Diabetic retinopathy ([place]) 06/16/2013  . Gynecomastia 04/18/2013      Prior to Admission medications   Medication Sig Start Date End Date Taking? Authorizing Provider  aspirin EC 81 MG tablet Take 81 mg by mouth daily.   Yes [provider]  atorvastatin (LIPITOR) 40 MG tablet Take 40 mg by mouth at bedtime.   Yes [provider]  citalopram (CELEXA) 20 MG tablet Take 20 mg by mouth daily.   Yes [provider]  HYDROcodone-acetaminophen (NORCO) 5-325 MG tablet Take 1 tablet by mouth every 6 (six) hours as needed for moderate pain. 05/20/17  Yes Vaughan Basta, MD  insulin aspart (NOVOLOG) 100 UNIT/ML injection Inject 5 Units into the skin 3 (three) times daily before meals.   Yes [provider]  insulin glargine (LANTUS) 100 UNIT/ML injection Inject 0.3 mLs (30 Units total) into the skin at  bedtime. 05/20/17  Yes Vaughan Basta, MD  levothyroxine (SYNTHROID, LEVOTHROID) 200 MCG tablet Take 1 tablet (200 mcg total) by mouth daily before breakfast. 05/21/17  Yes Vaughan Basta, MD  amLODipine (NORVASC) 10 MG tablet Take 1 tablet (10 mg total) by mouth daily. Patient not taking: Reported on  05/17/2017 09/16/16   Theodoro Grist, MD  feeding supplement, ENSURE ENLIVE, (ENSURE ENLIVE) LIQD Take 237 mLs by mouth 3 (three) times daily between meals. 05/20/17   Vaughan Basta, MD  gabapentin (NEURONTIN) 100 MG capsule TAKE 1-3 CAPSULES BY MOUTH BEFORE BEDTIME FOR ARM PAIN 01/14/17   [provider]  hydroxypropyl methylcellulose / hypromellose (ISOPTO TEARS / GONIOVISC) 2.5 % ophthalmic solution Place 1 drop into both eyes 3 (three) times daily as needed for dry eyes.    [provider]  hydrOXYzine (ATARAX/VISTARIL) 10 MG tablet Take 10 mg by mouth every 8 (eight) hours as needed for itching.     [provider]  lipase/protease/amylase (CREON) 12000 units CPEP capsule Take 24,000 Units by mouth 3 (three) times daily with meals.     [provider]  Multiple Vitamin (MULTIVITAMIN) LIQD Take 15 mLs by mouth daily. Patient not taking: Reported on 06/16/2017 05/21/17   Vaughan Basta, MD  neomycin-bacitracin-polymyxin (NEOSPORIN) [id number] ointment Apply 1 application topically 4 (four) times daily. Apply to toe    [provider]  pantoprazole sodium (PROTONIX) 40 mg/20 mL PACK Take 20 mLs (40 mg total) by mouth 2 (two) times daily before a meal. Patient not taking: Reported on 06/16/2017 05/20/17   Vaughan Basta, MD    Allergies Patient has no known allergies.    Social History Social History  Substance Use Topics  . Smoking status: Former Smoker    Packs/day: 0.10    Years: 25.00    Types: Cigarettes    Quit date: 10/31/1978  . Smokeless tobacco: Never Used     Comment: smoked 2-3 cigarettes/day  . Alcohol use No    Review of Systems Patient denies headaches, rhinorrhea, blurry vision, numbness, shortness of breath, chest pain, edema, cough, abdominal pain, nausea, vomiting, diarrhea, dysuria, fevers, rashes or hallucinations unless otherwise stated above in  HPI. ____________________________________________   PHYSICAL EXAM:  VITAL SIGNS: Vitals:   06/16/17 1425 06/16/17 1432  BP: 117/69 (!) 107/57  Pulse: 83 83  Resp:  18  Temp:  97.7 F (36.5 C)  SpO2:  100%    Constitutional: Alert and oriented. Well appearing and in no acute distress. Eyes: Conjunctivae are normal.  Head: Atraumatic. Nose: No congestion/rhinnorhea. Mouth/Throat: Mucous membranes are moist.   Neck: No stridor. Painless ROM.  Cardiovascular: Normal rate, regular rhythm. Grossly normal heart sounds.  Good peripheral circulation. Respiratory: Normal respiratory effort.  No retractions. Lungs CTAB. Gastrointestinal: Soft and nontender. No distention. No abdominal bruits. No CVA tenderness. Musculoskeletal: No lower extremity tenderness nor edema.  No joint effusions. Neurologic:  Normal speech and language. No gross focal neurologic deficits are appreciated. No facial droop Skin:  Skin is warm, dry and intact. No rash noted. Psychiatric: Mood and affect are normal. Speech and behavior are normal.  ____________________________________________   LABS (all labs ordered are listed, but only abnormal results are displayed)  Results for orders placed or performed during the hospital encounter of 06/16/17 (from the past 24 hour(s))  Lipase, blood     Status: None   Collection Time: 06/16/17  2:31 PM  Result Value Ref Range   Lipase 42 11 - 51 U/L  Comprehensive metabolic  panel     Status: Abnormal   Collection Time: 06/16/17  2:31 PM  Result Value Ref Range   Sodium 131 (L) 135 - 145 mmol/L   Potassium 6.4 (HH) 3.5 - 5.1 mmol/L   Chloride 103 101 - 111 mmol/L   CO2 21 (L) 22 - 32 mmol/L   Glucose, Bld 409 (H) 65 - 99 mg/dL   BUN 36 (H) 6 - 20 mg/dL   Creatinine, Ser 2.05 (H) 0.61 - 1.24 mg/dL   Calcium 8.6 (L) 8.9 - 10.3 mg/dL   Total Protein 6.6 6.5 - 8.1 g/dL   Albumin 3.5 3.5 - 5.0 g/dL   AST 26 15 - 41 U/L   ALT 15 (L) 17 - 63 U/L   Alkaline Phosphatase  140 (H) 38 - 126 U/L   Total Bilirubin 0.6 0.3 - 1.2 mg/dL   GFR calc non Af Amer 32 (L) >60 mL/min   GFR calc Af Amer 37 (L) >60 mL/min   Anion gap 7 5 - 15  CBC     Status: Abnormal   Collection Time: 06/16/17  2:31 PM  Result Value Ref Range   WBC 5.3 3.8 - 10.6 K/uL   RBC 4.04 (L) 4.40 - 5.90 MIL/uL   Hemoglobin 10.9 (L) 13.0 - 18.0 g/dL   HCT 32.8 (L) 40.0 - 52.0 %   MCV 81.3 80.0 - 100.0 fL   MCH 26.9 26.0 - 34.0 pg   MCHC 33.1 32.0 - 36.0 g/dL   RDW 13.9 11.5 - 14.5 %   Platelets 164 150 - 440 K/uL  Urinalysis, Complete w Microscopic     Status: Abnormal   Collection Time: 06/16/17  2:31 PM  Result Value Ref Range   Color, Urine YELLOW (A) YELLOW   APPearance CLEAR (A) CLEAR   Specific Gravity, Urine 1.014 1.005 - 1.030   pH 5.0 5.0 - 8.0   Glucose, UA >=500 (A) NEGATIVE mg/dL   Hgb urine dipstick NEGATIVE NEGATIVE   Bilirubin Urine NEGATIVE NEGATIVE   Ketones, ur NEGATIVE NEGATIVE mg/dL   Protein, ur NEGATIVE NEGATIVE mg/dL   Nitrite NEGATIVE NEGATIVE   Leukocytes, UA NEGATIVE NEGATIVE   RBC / HPF 0-5 0 - 5 RBC/hpf   WBC, UA 0-5 0 - 5 WBC/hpf   Bacteria, UA RARE (A) NONE SEEN   Squamous Epithelial / LPF 0-5 (A) NONE SEEN   ____________________________________________  EKG My review and personal interpretation at Time: 16:13   Indication: hyperk  Rate: 70  Rhythm: sinus Axis: normal Other: normal intervals, no stemi, no peaked t waves ____________________________________________  RADIOLOGY  ____________________________________________   PROCEDURES  Procedure(s) performed:  Procedures    Critical Care performed: yes CRITICAL CARE Performed by: Merlyn Lot   Total critical care time: 30 minutes  Critical care time was exclusive of separately billable procedures and treating other patients.  Critical care was necessary to treat or prevent imminent or life-threatening deterioration.  Critical care was time spent personally by me on the  following activities: development of treatment plan with patient and/or surrogate as well as nursing, discussions with consultants, evaluation of patient's response to treatment, examination of patient, obtaining history from patient or surrogate, ordering and performing treatments and interventions, ordering and review of laboratory studies, ordering and review of radiographic studies, pulse oximetry and re-evaluation of patient's condition.  ____________________________________________   INITIAL IMPRESSION / ASSESSMENT AND PLAN / ED COURSE  Pertinent labs & imaging results that were available during my care of the patient  were reviewed by me and considered in my medical decision making (see chart for details).  DDX: electroyle abn, dehydration, enteritis, c dif, aki  LONI ABDON is a 66 y.o. who presents to the ED with Diarrhea hypotension and hyperkalemia. Patient currently hemodynamically stable and in no acute distress but based on his elevated potassium level and evidence of volume losses we'll start IV fluids as well as IV insulin IV calcium and sodium bicarbonate. His abdominal exam is soft and benign. Does not seem consistent with a surgical process. Will send stool cultures to evaluate for any component of infectious process. At this point do feel patient will require admission for serial lab tests as well as IV hydration and hemodynamic monitoring.      ____________________________________________   FINAL CLINICAL IMPRESSION(S) / ED DIAGNOSES  Final diagnoses:  Hyperkalemia  Diarrhea, unspecified type      NEW MEDICATIONS STARTED DURING THIS VISIT:  New Prescriptions   No medications on file     Note:  This document was prepared using Dragon voice recognition software and may include unintentional dictation errors.    Merlyn Lot, MD 06/16/17 [phone]

## 2017-06-16 NOTE — ED Triage Notes (Signed)
Pt was sent from PCP for having a systolic pressure in the 67'T. States he was there for a follow up from a fall.pt states he has had loose stools for 6-7 months  And was having dizziness this morning. Denies any abd pain, c/o right rib pain from previous fall.

## 2017-06-17 LAB — GLUCOSE, CAPILLARY
GLUCOSE-CAPILLARY: 193 mg/dL — AB (ref 65–99)
GLUCOSE-CAPILLARY: 111 mg/dL — AB (ref 65–99)
GLUCOSE-CAPILLARY: 142 mg/dL — AB (ref 65–99)
Glucose-Capillary: 248 mg/dL — ABNORMAL HIGH (ref 65–99)
Glucose-Capillary: 34 mg/dL — CL (ref 65–99)
Glucose-Capillary: 52 mg/dL — ABNORMAL LOW (ref 65–99)
Glucose-Capillary: 271 mg/dL — ABNORMAL HIGH (ref 65–99)

## 2017-06-17 LAB — CBC
HCT: 33.3 % — ABNORMAL LOW (ref 40.0–52.0)
Hemoglobin: 11.2 g/dL — ABNORMAL LOW (ref 13.0–18.0)
MCH: 27.5 pg (ref 26.0–34.0)
MCHC: 33.5 g/dL (ref 32.0–36.0)
MCV: 82.3 fL (ref 80.0–100.0)
Platelets: 154 10*3/uL (ref 150–440)
RBC: 4.05 MIL/uL — ABNORMAL LOW (ref 4.40–5.90)
RDW: 14 % (ref 11.5–14.5)
WBC: 3.9 10*3/uL (ref 3.8–10.6)

## 2017-06-17 LAB — BASIC METABOLIC PANEL
Anion gap: 3 — ABNORMAL LOW (ref 5–15)
BUN: 27 mg/dL — AB (ref 6–20)
CO2: 25 mmol/L (ref 22–32)
Calcium: 8.3 mg/dL — ABNORMAL LOW (ref 8.9–10.3)
Chloride: 109 mmol/L (ref 101–111)
Creatinine, Ser: 1.66 mg/dL — ABNORMAL HIGH (ref 0.61–1.24)
GFR calc Af Amer: 48 mL/min — ABNORMAL LOW (ref >60)
GFR, EST NON AFRICAN AMERICAN: 41 mL/min — AB (ref >60)
GLUCOSE: 280 mg/dL — AB (ref 65–99)
Potassium: 5.4 mmol/L — ABNORMAL HIGH (ref 3.5–5.1)
Sodium: 137 mmol/L (ref 135–145)

## 2017-06-17 MED ORDER — PATIROMER SORBITEX CALCIUM 8.4 G PO PACK
8.4000 g | PACK | Freq: Once | ORAL | Status: AC
  Administered 2017-06-17: 8.4 g via ORAL
  Filled 2017-06-17: qty 4

## 2017-06-17 MED ORDER — ENSURE ENLIVE PO LIQD
237.0000 mL | Freq: Three times a day (TID) | ORAL | Status: DC
  Administered 2017-06-17 – 2017-06-19 (×6): 237 mL via ORAL

## 2017-06-17 MED ORDER — ACETAMINOPHEN 325 MG PO TABS: 650.0000 mg | ORAL_TABLET | Freq: Four times a day (QID) | ORAL | Status: DC | PRN

## 2017-06-17 NOTE — Care Management (Signed)
Patient had home health referral with [facility] 8/12.  Patient says his sister set up without his approval "and when she got to my house there were words and that was that." It sound as if patient was not open to [facility].  Have reached out to discuss.  There is a consult for CM for medication needs.  Patient says his [employer] dropped him but they picked him back up September 1st.  he has a rolling walker. CM is investigating pharmacy coverage.

## 2017-06-17 NOTE — Progress Notes (Signed)
Patient's daughter updated on patient's condition. She would like to be notified when patient is going to be discharged. Earleen Reaper, RN

## 2017-06-17 NOTE — Progress Notes (Signed)
Patient's B.S. found to be only 36. Patient still A + O x 4, but drowsy. Patient given 2 OJ's by NT and lunch tray ordered. Will recheck B.S. in 15 - 20 minutes. Wenda Low [facility]

## 2017-06-17 NOTE — Progress Notes (Signed)
Inpatient Diabetes Program Recommendations  AACE/ADA: New Consensus Statement on Inpatient Glycemic Control (2015)  Target Ranges:  Prepandial:   less than 140 mg/dL      Peak postprandial:   less than 180 mg/dL (1-2 hours)      Critically ill patients:  140 - 180 mg/dL   Results for GABOR, LUSK (MRN [id number]) as of 06/17/2017 11:15  Ref. Range 06/16/2017 21:46 06/17/2017 02:55 06/17/2017 07:27  Glucose-Capillary Latest Ref Range: 65 - 99 mg/dL 182 (H) 193 (H) 248 (H)    Review of Glycemic Control  Diabetes history: DM2 Outpatient Diabetes medications: Lantus 30 units QHS, Novolog 5 units TID with meals Current orders for Inpatient glycemic control: Novolog 0-9 units TID with meals, Novolog 5 units TID with meals  Inpatient Diabetes Program Recommendations: Insulin - Basal: Please consider ordering Lantus 15 units daily (based on 77 kg x 0.2 units). Correction (SSI): Please consider ordering Novolog 0-5 units QHS.  Thanks, Barnie Alderman, RN, MSN, CDE Diabetes Coordinator Inpatient Diabetes Program [phone] (Team Pager from 8am to 5pm)

## 2017-06-17 NOTE — Plan of Care (Signed)
Problem: Fluid Volume: Goal: Ability to maintain a balanced intake and output will improve Outcome: Progressing Patient willing to drink Ensure supplements throughout the shift. Will continue to monitor. Wenda Low [facility]

## 2017-06-17 NOTE — Progress Notes (Signed)
Initial Nutrition Assessment  DOCUMENTATION CODES:   Not applicable  INTERVENTION:  1. Recommended Ensure Enlive po TID, each supplement provides 350 kcal and 20 grams of protein  NUTRITION DIAGNOSIS:   Biting/chewing difficulty related to dysphagia as evidenced by per patient/family report (lack of teeth).  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 He reports good appetite, no weight loss, but he struggles with food feeling like it gets stuck in his upper chest. At home normally eats 3 meals a day, but mostly eats soft food or liquids. Drinks Ensure 3 times a day, milk, other liquids. Upon previous admission he was on NDD2 diet, also has poor dentition. Ate all of his eggs and french toast this morning. Discussed with RN, no issues or risk of aspiration noted. Will continue on current diet right now, and monitor for needs. No other needs apparent at this time. Nutrition-Focused physical exam completed. Findings are no fat depletion, no muscle depletion, and no edema.   Labs reviewed:  CBGs 248, 193, 182 K 5.4, BUN/Creatinine 27/1.66  Medications reviewed and include:  Novolog 0-9 Units TID with meals, 5 Units TID before meals Creon 24000 units TID NS at 63mL/hr   Intake/Output Summary (Last 24 hours) at 06/17/17 1207 Last data filed at 06/17/17 0441  Gross per 24 hour  Intake             1000 ml  Output              800 ml  Net              200 ml      Diet Order:  Diet heart healthy/carb modified Room service appropriate? Yes; Fluid  consistency: Thin  Skin:  Wound (see comment) (abrasion to R knee, L Shoulder)  Last BM:  PTA  Height:   Ht Readings from Last 1 Encounters:  06/16/17 5\' 11"  (1.803 m)    Weight:   Wt Readings from Last 1 Encounters:  06/16/17 170 lb (77.1 kg)    Ideal Body Weight:  78.18 kg  BMI:  Body mass index is 23.71 kg/m.  Estimated Nutritional Needs:   Kcal:  1900-2050 calories (MSJ x1.2-1.3)  Protein:  93-108 grams (1.2-1.4g/kg)  Fluid:  1.9-2.1L  EDUCATION NEEDS:   No education needs identified at this time  Cameron Adams. Cameron Darko, MS, RD LDN Inpatient Clinical Dietitian Pager [phone]

## 2017-06-18 LAB — BASIC METABOLIC PANEL
ANION GAP: 4 — AB (ref 5–15)
BUN: 26 mg/dL — AB (ref 6–20)
CHLORIDE: 107 mmol/L (ref 101–111)
CO2: 25 mmol/L (ref 22–32)
Calcium: 8.6 mg/dL — ABNORMAL LOW (ref 8.9–10.3)
Creatinine, Ser: 1.6 mg/dL — ABNORMAL HIGH (ref 0.61–1.24)
GFR calc Af Amer: 50 mL/min — ABNORMAL LOW (ref >60)
GFR calc non Af Amer: 43 mL/min — ABNORMAL LOW (ref >60)
GLUCOSE: 303 mg/dL — AB (ref 65–99)
POTASSIUM: 6.3 mmol/L — AB (ref 3.5–5.1)
Sodium: 136 mmol/L (ref 135–145)

## 2017-06-18 LAB — GLUCOSE, CAPILLARY
GLUCOSE-CAPILLARY: 279 mg/dL — AB (ref 65–99)
GLUCOSE-CAPILLARY: 245 mg/dL — AB (ref 65–99)
GLUCOSE-CAPILLARY: 230 mg/dL — AB (ref 65–99)
Glucose-Capillary: 184 mg/dL — ABNORMAL HIGH (ref 65–99)
Glucose-Capillary: 183 mg/dL — ABNORMAL HIGH (ref 65–99)

## 2017-06-18 LAB — POTASSIUM: POTASSIUM: 5.1 mmol/L (ref 3.5–5.1)

## 2017-06-18 MED ORDER — INSULIN ASPART 100 UNIT/ML ~~LOC~~ SOLN: 5.0000 [IU] | Freq: Once | SUBCUTANEOUS | Status: DC

## 2017-06-18 MED ORDER — SODIUM CHLORIDE 0.9 % IV BOLUS (SEPSIS)
250.0000 mL | Freq: Once | INTRAVENOUS | Status: AC
  Administered 2017-06-18: 250 mL via INTRAVENOUS

## 2017-06-18 MED ORDER — INSULIN GLARGINE 100 UNIT/ML ~~LOC~~ SOLN
10.0000 [IU] | Freq: Every day | SUBCUTANEOUS | Status: DC
  Administered 2017-06-18: 10 [IU] via SUBCUTANEOUS
  Filled 2017-06-18 (×2): qty 0.1

## 2017-06-18 MED ORDER — INSULIN REGULAR HUMAN 100 UNIT/ML IJ SOLN
10.0000 [IU] | Freq: Once | INTRAMUSCULAR | Status: AC
  Administered 2017-06-18: 10 [IU] via INTRAVENOUS
  Filled 2017-06-18: qty 0.1

## 2017-06-18 MED ORDER — SODIUM POLYSTYRENE SULFONATE 15 GM/60ML PO SUSP
30.0000 g | Freq: Once | ORAL | Status: AC
  Administered 2017-06-18: 30 g via ORAL
  Filled 2017-06-18: qty 120

## 2017-06-18 NOTE — Progress Notes (Signed)
Inpatient Diabetes Program Recommendations  AACE/ADA: New Consensus Statement on Inpatient Glycemic Control (2015)  Target Ranges:  Prepandial:   less than 140 mg/dL      Peak postprandial:   less than 180 mg/dL (1-2 hours)      Critically ill patients:  140 - 180 mg/dL  Results for MCKENZIE, TORUNO (MRN [id number]) as of 06/18/2017 11:10  Ref. Range 06/18/2017 03:32  Glucose Latest Ref Range: 65 - 99 mg/dL 303 (H)  Regular 10 units @ 5:07 am   Results for EZEQUIAS, LARD (MRN [id number]) as of 06/18/2017 11:10  Ref. Range 06/17/2017 07:27 06/17/2017 12:00 06/17/2017 14:20 06/17/2017 14:37 06/17/2017 16:01 06/17/2017 20:41 06/18/2017 06:03 06/18/2017 07:58  Glucose-Capillary Latest Ref Range: 65 - 99 mg/dL 248 (H)  Novolog 8 units 111 (H)  Novolog 5 untis 34 (LL) 52 (L) 142 (H)  Novolog 1 unit 271 (H)   184 (H) 183 (H)   Review of Glycemic Control Diabetes history: DM2 Outpatient Diabetes medications: Lantus 30 units QHS, Novolog 5 units TID with meals Current orders for Inpatient glycemic control: Novolog 0-9 units TID with meals, Novolog 5 units TID with meals  Inpatient Diabetes Program Recommendations: Insulin - Basal: Please consider ordering Lantus 10 units daily. Correction (SSI): Please consider ordering Novolog 0-5 units QHS.  NOTE: In reviewing chart, noted patient received Novolog 5 units at 12:27 on 06/17/17 for meal coverage and following glucose down to 34 mg/dl at 14:20 on 06/17/17.  Anticipate hypoglycemia from getting meal coverage and not eating (per chart-lunch tray was not ordered until after hypoglycemic episode). Lab glucose 303 mg/dl at 3:32 am today and Regular 10 units given at 5:07 am for treatment of hyperkalemia. Recommend ordering low dose basal insulin and ordering Novolog bedtime correction scale.  Thanks, Barnie Alderman, RN, MSN, CDE Diabetes Coordinator Inpatient Diabetes Program [phone] (Team Pager from 8am to 5pm)

## 2017-06-18 NOTE — Progress Notes (Signed)
[facility] [place] NAME: Cameron Adams    MR#:  [id number]  DATE OF BIRTH:  Jan 08, 1951  SUBJECTIVE:  CHIEF COMPLAINT:   Chief Complaint  Patient presents with  . Hypotension  . Diarrhea     Had hypoglycemia today, stopped Long acting insulin.  REVIEW OF SYSTEMS:  CONSTITUTIONAL: No fever, fatigue or weakness.  EYES: No blurred or double vision.  EARS, NOSE, AND THROAT: No tinnitus or ear pain.  RESPIRATORY: No cough, shortness of breath, wheezing or hemoptysis.  CARDIOVASCULAR: No chest pain, orthopnea, edema.  GASTROINTESTINAL: No nausea, vomiting, diarrhea or abdominal pain.  GENITOURINARY: No dysuria, hematuria.  ENDOCRINE: No polyuria, nocturia,  HEMATOLOGY: No anemia, easy bruising or bleeding SKIN: No rash or lesion. MUSCULOSKELETAL: No joint pain or arthritis.   NEUROLOGIC: No tingling, numbness, weakness.  PSYCHIATRY: No anxiety or depression.   ROS  DRUG ALLERGIES:  No Known Allergies  VITALS:  Blood pressure (!) 120/59, pulse 74, temperature (!) 97.5 F (36.4 C), resp. rate 18, height 5\' 11"  (1.803 m), weight 77.1 kg (170 lb), SpO2 100 %.  PHYSICAL EXAMINATION:  GENERAL:  66 y.o.-year-old patient lying in the bed with no acute distress.  EYES: Pupils equal, round, reactive to light and accommodation. No scleral icterus. Extraocular muscles intact.  HEENT: Head atraumatic, normocephalic. Oropharynx and nasopharynx clear.  NECK:  Supple, no jugular venous distention. No thyroid enlargement, no tenderness.  LUNGS: Normal breath sounds bilaterally, no wheezing, rales,rhonchi or crepitation. No use of accessory muscles of respiration.  CARDIOVASCULAR: S1, S2 normal. No murmurs, rubs, or gallops.  ABDOMEN: Soft, nontender, nondistended. Bowel sounds present. No organomegaly or mass.  EXTREMITIES: No pedal edema, cyanosis, or clubbing.  NEUROLOGIC: Cranial nerves II through XII are intact. Muscle strength 4/5 in all  extremities. Sensation intact. Gait not checked.  PSYCHIATRIC: The patient is alert and oriented x 3.  SKIN: No obvious rash, lesion, or ulcer.   Physical Exam LABORATORY PANEL:   CBC  Recent Labs Lab 06/17/17 0605  WBC 3.9  HGB 11.2*  HCT 33.3*  PLT 154   ------------------------------------------------------------------------------------------------------------------  Chemistries   Recent Labs Lab 06/16/17 1431  06/18/17 0332 06/18/17 0830  NA 131*  < > 136  --   K 6.4*  < > 6.3* 5.1  CL 103  < > 107  --   CO2 21*  < > 25  --   GLUCOSE 409*  < > 303*  --   BUN 36*  < > 26*  --   CREATININE 2.05*  < > 1.60*  --   CALCIUM 8.6*  < > 8.6*  --   AST 26  --   --   --   ALT 15*  --   --   --   ALKPHOS 140*  --   --   --   BILITOT 0.6  --   --   --   < > = values in this interval not displayed. ------------------------------------------------------------------------------------------------------------------  Cardiac Enzymes No results for input(s): TROPONINI in the last 168 hours. ------------------------------------------------------------------------------------------------------------------  RADIOLOGY:  No results found.  ASSESSMENT AND PLAN:   Active Problems:   Hyperkalemia   * Hyperkalemia   Valtassa, Follow. Came down.    Calcium and insulin IV given by ER.    IV fluids    No EKG changes.  * Diarrhea   Recent admission, pt said he had pneumonia also, but I don't see in last d/c summary.  Will check for c diff.   He did not have any BM, so d.c order for c diff.  * Ac on CKD stage 3   IV fluids , monitor. Improved.  * Hypotension    Pt was sent with this, but his BP is high now.     Cont IV fluids and amlodipine. Stable.  * Hypoglycemia   Stop long acting insulin.   Responded to juices oral.  * DM   ISS.  * Dysphagia    Have esophageal issues     Barium study done last time, GI suggest to come in office.   All the records are  reviewed and case discussed with Care Management/Social Workerr. Management plans discussed with the patient, family and they are in agreement.  CODE STATUS: full.  TOTAL TIME TAKING CARE OF THIS PATIENT: 35 minutes.     POSSIBLE D/C IN 1-2 DAYS, DEPENDING ON CLINICAL CONDITION.   Vaughan Basta M.D on 06/18/2017   Between 7am to 6pm - Pager - [phone]  After 6pm go to [email or web address] - password [email or web address] [facility]  Office  [phone]  CC: Primary care physician; Patient, No Pcp Per  Note: This dictation was prepared with Dragon dictation along with smaller phrase technology. Any transcriptional errors that result from this process are unintentional.

## 2017-06-18 NOTE — Care Management (Signed)
Was able to confirm that patient is current with [facility] for his medications- effective Sept 1 2018- plan ID- [id number]. Attending verbalizes concern that patient is not compliant with his medications at home.  Discussed the home health referral for nursing at discharge and at present, the patient has verbalized agreement. Barrier to discharge today was hypotension

## 2017-06-18 NOTE — Progress Notes (Addendum)
CRITICAL VALUE ALERT  Critical Value:  Potassium 6.3  Date & Time Notied:  06/18/2017 0429  Provider Notified: Pyreddy MD  Orders Received/Actions taken: Give 30g Kayexalate PO and 10 units regular insulin IV now, recheck CBG in 30-45 minutes, and recheck potassium at 0800.   Nursing staff will continue to monitor for any changes in patient status. Earleen Reaper, RN

## 2017-06-18 NOTE — Progress Notes (Signed)
Upon assessment pt b/p found to be 90/44 pulse 52 on Left arm, bp on right arm 84/42, manually 85/52. Pt c/o of dizziness. MD Anselm Jungling notified, new orders received to give 250 ml n/s bolus, and hold amlodipine. Will continue to monitor.

## 2017-06-18 NOTE — Progress Notes (Signed)
[facility] at [place] NAME: Cameron Adams    MR#:  [id number]  DATE OF BIRTH:  1950-11-14  SUBJECTIVE:  CHIEF COMPLAINT:   Chief Complaint  Patient presents with  . Hypotension  . Diarrhea    Blood sugar is rising up again today, but had borderline hypotension and he was symptomatic with that. Required 250 ml IV fluid bolus.  REVIEW OF SYSTEMS:  CONSTITUTIONAL: No fever, fatigue or weakness.  EYES: No blurred or double vision.  EARS, NOSE, AND THROAT: No tinnitus or ear pain.  RESPIRATORY: No cough, shortness of breath, wheezing or hemoptysis.  CARDIOVASCULAR: No chest pain, orthopnea, edema.  GASTROINTESTINAL: No nausea, vomiting, diarrhea or abdominal pain.  GENITOURINARY: No dysuria, hematuria.  ENDOCRINE: No polyuria, nocturia,  HEMATOLOGY: No anemia, easy bruising or bleeding SKIN: No rash or lesion. MUSCULOSKELETAL: No joint pain or arthritis.   NEUROLOGIC: No tingling, numbness, weakness.  PSYCHIATRY: No anxiety or depression.   ROS  DRUG ALLERGIES:  No Known Allergies  VITALS:  Blood pressure (!) 120/59, pulse 74, temperature (!) 97.5 F (36.4 C), resp. rate 18, height 5\' 11"  (1.803 m), weight 77.1 kg (170 lb), SpO2 100 %.  PHYSICAL EXAMINATION:  GENERAL:  66 y.o.-year-old patient lying in the bed with no acute distress.  EYES: Pupils equal, round, reactive to light and accommodation. No scleral icterus. Extraocular muscles intact.  HEENT: Head atraumatic, normocephalic. Oropharynx and nasopharynx clear.  NECK:  Supple, no jugular venous distention. No thyroid enlargement, no tenderness.  LUNGS: Normal breath sounds bilaterally, no wheezing, rales,rhonchi or crepitation. No use of accessory muscles of respiration.  CARDIOVASCULAR: S1, S2 normal. No murmurs, rubs, or gallops.  ABDOMEN: Soft, nontender, nondistended. Bowel sounds present. No organomegaly or mass.  EXTREMITIES: No pedal edema, cyanosis, or clubbing.   NEUROLOGIC: Cranial nerves II through XII are intact. Muscle strength 4/5 in all extremities. Sensation intact. Gait not checked.  PSYCHIATRIC: The patient is alert and oriented x 3.  SKIN: No obvious rash, lesion, or ulcer.   Physical Exam LABORATORY PANEL:   CBC  Recent Labs Lab 06/17/17 0605  WBC 3.9  HGB 11.2*  HCT 33.3*  PLT 154   ------------------------------------------------------------------------------------------------------------------  Chemistries   Recent Labs Lab 06/16/17 1431  06/18/17 0332 06/18/17 0830  NA 131*  < > 136  --   K 6.4*  < > 6.3* 5.1  CL 103  < > 107  --   CO2 21*  < > 25  --   GLUCOSE 409*  < > 303*  --   BUN 36*  < > 26*  --   CREATININE 2.05*  < > 1.60*  --   CALCIUM 8.6*  < > 8.6*  --   AST 26  --   --   --   ALT 15*  --   --   --   ALKPHOS 140*  --   --   --   BILITOT 0.6  --   --   --   < > = values in this interval not displayed. ------------------------------------------------------------------------------------------------------------------  Cardiac Enzymes No results for input(s): TROPONINI in the last 168 hours. ------------------------------------------------------------------------------------------------------------------  RADIOLOGY:  No results found.  ASSESSMENT AND PLAN:   Active Problems:   Hyperkalemia   * Hyperkalemia   Valtassa, Follow. Came down.    Calcium and insulin IV given by ER.    IV fluids    No EKG changes.   Potassium went high again today,  and given kayexalate, with correction.  * Diarrhea   Recent admission, pt said he had pneumonia also, but I don't see in last d/c summary.   Will check for c diff.   He did not have any BM, so d.c order for c diff.  * Ac on CKD stage 3   IV fluids , monitor. Improved.  * Hypotension    Pt was sent with this, then BP was high in ER, and cont on amlodipine.    TODAY BP on lower side, and have some dizziness with that.    Stop Amlodipine and IV  fluids bolus given, responded.  * Hypoglycemia   Stop long acting insulin.   Responded to juices oral.   Now Blood sugar slightly high, start small dose lantus.  * DM   ISS.  * Dysphagia    Have esophageal issues     Barium study done last time, GI suggest to come in office.   All the records are reviewed and case discussed with Care Management/Social Workerr. Management plans discussed with the patient, family and they are in agreement.  CODE STATUS: full.  TOTAL TIME TAKING CARE OF THIS PATIENT: 35 minutes.    POSSIBLE D/C IN 1-2 DAYS, DEPENDING ON CLINICAL CONDITION.   Vaughan Basta M.D on 06/18/2017   Between 7am to 6pm - Pager - [phone]  After 6pm go to [email or web address] - password [email or web address] [facility]  Office  [phone]  CC: Primary care physician; Patient, No Pcp Per  Note: This dictation was prepared with Dragon dictation along with smaller phrase technology. Any transcriptional errors that result from this process are unintentional.

## 2017-06-19 LAB — BASIC METABOLIC PANEL
ANION GAP: 4 — AB (ref 5–15)
BUN: 26 mg/dL — ABNORMAL HIGH (ref 6–20)
CHLORIDE: 110 mmol/L (ref 101–111)
CO2: 26 mmol/L (ref 22–32)
Calcium: 8.5 mg/dL — ABNORMAL LOW (ref 8.9–10.3)
Creatinine, Ser: 1.48 mg/dL — ABNORMAL HIGH (ref 0.61–1.24)
GFR calc non Af Amer: 47 mL/min — ABNORMAL LOW (ref >60)
GFR, EST AFRICAN AMERICAN: 55 mL/min — AB (ref >60)
Glucose, Bld: 230 mg/dL — ABNORMAL HIGH (ref 65–99)
Potassium: 5 mmol/L (ref 3.5–5.1)
Sodium: 140 mmol/L (ref 135–145)

## 2017-06-19 LAB — GLUCOSE, CAPILLARY
GLUCOSE-CAPILLARY: 240 mg/dL — AB (ref 65–99)
Glucose-Capillary: 272 mg/dL — ABNORMAL HIGH (ref 65–99)

## 2017-06-19 MED ORDER — INSULIN GLARGINE 100 UNIT/ML ~~LOC~~ SOLN: 10.0000 [IU] | Freq: Every day | SUBCUTANEOUS | 11 refills | Status: DC

## 2017-06-19 NOTE — Progress Notes (Signed)
Inpatient Diabetes Program Recommendations  AACE/ADA: New Consensus Statement on Inpatient Glycemic Control (2015)  Target Ranges:  Prepandial:   less than 140 mg/dL      Peak postprandial:   less than 180 mg/dL (1-2 hours)      Critically ill patients:  140 - 180 mg/dL   Results for Cameron Adams, Cameron Adams (MRN [id number]) as of 06/19/2017 08:38  Ref. Range 06/18/2017 07:58 06/18/2017 12:17 06/18/2017 16:02 06/18/2017 21:08 06/19/2017 07:52  Glucose-Capillary Latest Ref Range: 65 - 99 mg/dL 183 (H) 279 (H) 245 (H) 230 (H) 272 (H)    Review of Glycemic Control  Diabetes history:DM2 Outpatient Diabetes medications: Lantus 30 units QHS, Novolog 5 units TID with meals Current orders for Inpatient glycemic control: Lantus 10 units QHS,  ovolog 0-9 units TID with meals  Inpatient Diabetes Program Recommendations: Insulin - Basal: Please consider increasing Lantus to 12 units QHS. Correction (SSI): Please consider ordering Novolog 0-5 units QHS for bedtime correction. Insulin-Meal Coverage: Please consider ordering Novolog 3 units TID with meals for meal coverage.  Thanks, Barnie Alderman, RN, MSN, CDE Diabetes Coordinator Inpatient Diabetes Program [phone] (Team Pager from 8am to 5pm)

## 2017-06-19 NOTE — Care Management Important Message (Signed)
Important Message  Patient Details  Name: Cameron Adams MRN: [id number] Date of Birth: 1951-03-28   Medicare Important Message Given:  Yes Signed IM notice given    Katrina Stack, RN 06/19/2017, 3:18 PM

## 2017-06-19 NOTE — Care Management (Signed)
Patient in agreement with home health services with [facility]. Patient does have PCP is Dr Netty Starring at [facility]

## 2017-06-19 NOTE — Discharge Summary (Signed)
[facility] [place] NAME: Cameron Adams    MR#:  [id number]  DATE OF BIRTH:  12/18/1950  DATE OF ADMISSION:  06/16/2017 ADMITTING PHYSICIAN: Vaughan Basta, MD  DATE OF DISCHARGE: 06/19/2017  PRIMARY CARE PHYSICIAN: Patient, No Pcp Per    ADMISSION DIAGNOSIS:  Hyperkalemia [E87.5] Diarrhea, unspecified type [R19.7]  DISCHARGE DIAGNOSIS:  Active Problems:   Hyperkalemia   SECONDARY DIAGNOSIS:   Past Medical History:  Diagnosis Date  . Blindness of left eye   . Chronic kidney disease, stage III (moderate)   . Diabetes ([place])   . Hypertension   . Syncope and collapse   . Thyroid dysfunction     HOSPITAL COURSE:   * Hyperkalemia [place], Follow. Came down. Calcium and insulin IV given by ER. IV fluids No EKG changes.   Potassium went high again , and given kayexalate, with correction.  * Diarrhea Recent admission, pt said he had pneumonia also, but I don't see in last d/c summary. Will check for c diff.   He did not have any BM, so d.c order for c diff.   Diarrhea is better now.  * Ac on CKD stage 3 IV fluids , monitor. Improved.  * Hypotension Pt was sent with this, then BP was high in ER, and cont on amlodipine.    BP on lower side, and have some dizziness with that.    Stop Amlodipine and IV fluids bolus given, responded.   Pt confirms , he was not taking amlodipine. Stop on d/c.  * Hypoglycemia   Stop long acting insulin.   Responded to juices oral.   Now Blood sugar slightly high, start small dose lantus. ( 10 u daily)  * DM ISS.  * Dysphagia Have esophageal issues Barium study done last time, GI suggest to come in office.   DISCHARGE CONDITIONS:   Stable.  CONSULTS OBTAINED:    DRUG ALLERGIES:  No Known Allergies  DISCHARGE MEDICATIONS:   Current Discharge Medication List    CONTINUE these medications which have CHANGED   Details   insulin glargine (LANTUS) 100 UNIT/ML injection Inject 0.1 mLs (10 Units total) into the skin at bedtime. Qty: 10 mL, Refills: 11      CONTINUE these medications which have NOT CHANGED   Details  aspirin EC 81 MG tablet Take 81 mg by mouth daily.    atorvastatin (LIPITOR) 40 MG tablet Take 40 mg by mouth at bedtime.    citalopram (CELEXA) 20 MG tablet Take 20 mg by mouth daily.    gabapentin (NEURONTIN) 100 MG capsule TAKE 1-3 CAPSULES BY MOUTH BEFORE BEDTIME FOR ARM PAIN    HYDROcodone-acetaminophen (NORCO) 5-325 MG tablet Take 1 tablet by mouth every 6 (six) hours as needed for moderate pain. Qty: 20 tablet, Refills: 0    hydrOXYzine (ATARAX/VISTARIL) 10 MG tablet Take 10 mg by mouth every 8 (eight) hours as needed for itching.     insulin aspart (NOVOLOG) 100 UNIT/ML injection Inject 5 Units into the skin 3 (three) times daily before meals.    levothyroxine (SYNTHROID, LEVOTHROID) 200 MCG tablet Take 1 tablet (200 mcg total) by mouth daily before breakfast. Qty: 30 tablet, Refills: 0    lipase/protease/amylase (CREON) 12000 units CPEP capsule Take 24,000 Units by mouth 3 (three) times daily with meals.     feeding supplement, ENSURE ENLIVE, (ENSURE ENLIVE) LIQD Take 237 mLs by mouth 3 (three) times daily between meals. Qty: 237 mL, Refills: 12  hydroxypropyl methylcellulose / hypromellose (ISOPTO TEARS / GONIOVISC) 2.5 % ophthalmic solution Place 1 drop into both eyes 3 (three) times daily as needed for dry eyes.    pantoprazole sodium (PROTONIX) 40 mg/20 mL PACK Take 20 mLs (40 mg total) by mouth 2 (two) times daily before a meal. Qty: 30 each, Refills: 0      STOP taking these medications     amLODipine (NORVASC) 10 MG tablet      Multiple Vitamin (MULTIVITAMIN) LIQD          DISCHARGE INSTRUCTIONS:    Follow with PMD in 1-2 weeks.  If you experience worsening of your admission symptoms, develop shortness of breath, life threatening emergency, suicidal or  homicidal thoughts you must seek medical attention immediately by calling 911 or calling your MD immediately  if symptoms less severe.  You Must read complete instructions/literature along with all the possible adverse reactions/side effects for all the Medicines you take and that have been prescribed to you. Take any new Medicines after you have completely understood and accept all the possible adverse reactions/side effects.   Please note  You were cared for by a hospitalist during your hospital stay. If you have any questions about your discharge medications or the care you received while you were in the hospital after you are discharged, you can call the unit and asked to speak with the hospitalist on call if the hospitalist that took care of you is not available. Once you are discharged, your primary care physician will handle any further medical issues. Please note that NO REFILLS for any discharge medications will be authorized once you are discharged, as it is imperative that you return to your primary care physician (or establish a relationship with a primary care physician if you do not have one) for your aftercare needs so that they can reassess your need for medications and monitor your lab values.    Today   CHIEF COMPLAINT:   Chief Complaint  Patient presents with  . Hypotension  . Diarrhea    HISTORY OF PRESENT ILLNESS:  Cameron Adams  is a 66 y.o. male with a known history of  DM, Htn, Thyroid dysfunction, Dysphagia and esophageal dysmotility- have some diarrhea for last 2-3 days and noted hypotensive and dizzi, so sent to ER from office- noted to have renal failure and hyperkalemia.   VITAL SIGNS:  Blood pressure (!) 103/59, pulse 69, temperature 98 F (36.7 C), resp. rate 17, height 5\' 11"  (1.803 m), weight 77.1 kg (170 lb), SpO2 97 %.  I/O:   Intake/Output Summary (Last 24 hours) at 06/19/17 1206 Last data filed at 06/19/17 1112  Gross per 24 hour  Intake              1612 ml  Output             1500 ml  Net              112 ml    PHYSICAL EXAMINATION:   GENERAL:  66 y.o.-year-old patient lying in the bed with no acute distress.  EYES: Pupils equal, round, reactive to light and accommodation. No scleral icterus. Extraocular muscles intact.  HEENT: Head atraumatic, normocephalic. Oropharynx and nasopharynx clear.  NECK:  Supple, no jugular venous distention. No thyroid enlargement, no tenderness.  LUNGS: Normal breath sounds bilaterally, no wheezing, rales,rhonchi or crepitation. No use of accessory muscles of respiration.  CARDIOVASCULAR: S1, S2 normal. No murmurs, rubs, or gallops.  ABDOMEN: Soft, nontender,  nondistended. Bowel sounds present. No organomegaly or mass.  EXTREMITIES: No pedal edema, cyanosis, or clubbing.  NEUROLOGIC: Cranial nerves II through XII are intact. Muscle strength 4/5 in all extremities. Sensation intact. Gait not checked.  PSYCHIATRIC: The patient is alert and oriented x 3.  SKIN: No obvious rash, lesion, or ulcer.   DATA REVIEW:   CBC  Recent Labs Lab 06/17/17 0605  WBC 3.9  HGB 11.2*  HCT 33.3*  PLT 154    Chemistries   Recent Labs Lab 06/16/17 1431  06/19/17 0342  NA 131*  < > 140  K 6.4*  < > 5.0  CL 103  < > 110  CO2 21*  < > 26  GLUCOSE 409*  < > 230*  BUN 36*  < > 26*  CREATININE 2.05*  < > 1.48*  CALCIUM 8.6*  < > 8.5*  AST 26  --   --   ALT 15*  --   --   ALKPHOS 140*  --   --   BILITOT 0.6  --   --   < > = values in this interval not displayed.  Cardiac Enzymes No results for input(s): TROPONINI in the last 168 hours.  Microbiology Results  Results for orders placed or performed during the hospital encounter of 09/13/16  Culture, blood (Routine X 2) w Reflex to ID Panel     Status: None   Collection Time: 09/13/16  2:29 PM  Result Value Ref Range Status   Specimen Description BLOOD LEFT ARM  Final   Special Requests   Final    BOTTLES DRAWN AEROBIC AND ANAEROBIC AER 8CC ANA 11  CC   Culture NO GROWTH 5 DAYS  Final   Report Status 09/18/2016 FINAL  Final  Culture, blood (Routine X 2) w Reflex to ID Panel     Status: None   Collection Time: 09/13/16  2:29 PM  Result Value Ref Range Status   Specimen Description BLOOD RIGHT ARM  Final   Special Requests   Final    BOTTLES DRAWN AEROBIC AND ANAEROBIC AER [place] ANA [place]   Culture NO GROWTH 5 DAYS  Final   Report Status 09/18/2016 FINAL  Final    RADIOLOGY:  No results found.  EKG:   Orders placed or performed during the hospital encounter of 06/16/17  . ED EKG  . ED EKG  . EKG 12-Lead  . EKG 12-Lead      Management plans discussed with the patient, family and they are in agreement.  CODE STATUS:     Code Status Orders        Start     Ordered   06/16/17 1850  Full code  Continuous     06/16/17 1849    Code Status History    Date Active Date Inactive Code Status Order ID Comments User Context   05/17/2017  3:50 PM 05/20/2017  5:44 PM Full Code [id number]  Idelle Crouch, MD Inpatient   09/13/2016  5:56 PM 09/16/2016  6:56 PM Full Code [id number]  Fritzi Mandes, MD Inpatient      TOTAL TIME TAKING CARE OF THIS PATIENT: 35 minutes.    Vaughan Basta M.D on 06/19/2017 at 12:06 PM  Between 7am to 6pm - Pager - [phone]  After 6pm go to [email or web address] - password [email or web address] [facility]  Office  [phone]  CC: Primary care physician; Patient, No Pcp Per   Note: This dictation was prepared with Dragon dictation along  with smaller phrase technology. Any transcriptional errors that result from this process are unintentional.

## 2017-06-22 ENCOUNTER — Inpatient Hospital Stay
Admission: EM | Admit: 2017-06-22 | Discharge: 2017-06-29 | DRG: 312 | Disposition: A | Discharge status: Home / Self Care | Payer: Medicare Other | Attending: Internal Medicine | Admitting: Internal Medicine

## 2017-06-22 ENCOUNTER — Encounter: Payer: Self-pay | Admitting: Emergency Medicine

## 2017-06-22 DIAGNOSIS — Z9119 Patient's noncompliance with other medical treatment and regimen: Secondary | ICD-10-CM

## 2017-06-22 DIAGNOSIS — K529 Noninfective gastroenteritis and colitis, unspecified: Secondary | ICD-10-CM | POA: Diagnosis present

## 2017-06-22 DIAGNOSIS — Z794 Long term (current) use of insulin: Secondary | ICD-10-CM

## 2017-06-22 DIAGNOSIS — E1122 Type 2 diabetes mellitus with diabetic chronic kidney disease: Secondary | ICD-10-CM | POA: Diagnosis present

## 2017-06-22 DIAGNOSIS — N179 Acute kidney failure, unspecified: Secondary | ICD-10-CM | POA: Diagnosis present

## 2017-06-22 DIAGNOSIS — Z7982 Long term (current) use of aspirin: Secondary | ICD-10-CM

## 2017-06-22 DIAGNOSIS — E039 Hypothyroidism, unspecified: Secondary | ICD-10-CM | POA: Diagnosis present

## 2017-06-22 DIAGNOSIS — Z8249 Family history of ischemic heart disease and other diseases of the circulatory system: Secondary | ICD-10-CM

## 2017-06-22 DIAGNOSIS — I951 Orthostatic hypotension: Principal | ICD-10-CM | POA: Diagnosis present

## 2017-06-22 DIAGNOSIS — D472 Monoclonal gammopathy: Secondary | ICD-10-CM | POA: Diagnosis present

## 2017-06-22 DIAGNOSIS — I959 Hypotension, unspecified: Secondary | ICD-10-CM

## 2017-06-22 DIAGNOSIS — Z841 Family history of disorders of kidney and ureter: Secondary | ICD-10-CM

## 2017-06-22 DIAGNOSIS — I129 Hypertensive chronic kidney disease with stage 1 through stage 4 chronic kidney disease, or unspecified chronic kidney disease: Secondary | ICD-10-CM | POA: Diagnosis present

## 2017-06-22 DIAGNOSIS — R42 Dizziness and giddiness: Secondary | ICD-10-CM

## 2017-06-22 DIAGNOSIS — E860 Dehydration: Secondary | ICD-10-CM | POA: Diagnosis present

## 2017-06-22 DIAGNOSIS — H5462 Unqualified visual loss, left eye, normal vision right eye: Secondary | ICD-10-CM | POA: Diagnosis present

## 2017-06-22 DIAGNOSIS — N183 Chronic kidney disease, stage 3 (moderate): Secondary | ICD-10-CM | POA: Diagnosis present

## 2017-06-22 DIAGNOSIS — Z87891 Personal history of nicotine dependence: Secondary | ICD-10-CM

## 2017-06-22 DIAGNOSIS — E875 Hyperkalemia: Secondary | ICD-10-CM | POA: Diagnosis not present

## 2017-06-22 DIAGNOSIS — R197 Diarrhea, unspecified: Secondary | ICD-10-CM

## 2017-06-22 DIAGNOSIS — E1143 Type 2 diabetes mellitus with diabetic autonomic (poly)neuropathy: Secondary | ICD-10-CM | POA: Diagnosis present

## 2017-06-22 DIAGNOSIS — E1151 Type 2 diabetes mellitus with diabetic peripheral angiopathy without gangrene: Secondary | ICD-10-CM | POA: Diagnosis present

## 2017-06-22 DIAGNOSIS — D638 Anemia in other chronic diseases classified elsewhere: Secondary | ICD-10-CM | POA: Diagnosis present

## 2017-06-22 HISTORY — DX: Orthostatic hypotension: I95.1

## 2017-06-22 HISTORY — DX: Diarrhea, unspecified: R19.7

## 2017-06-22 HISTORY — DX: Monoclonal gammopathy: D47.2

## 2017-06-22 HISTORY — DX: Anemia in other chronic diseases classified elsewhere: D63.8

## 2017-06-22 HISTORY — DX: Hypothyroidism, unspecified: E03.9

## 2017-06-22 HISTORY — DX: Other ill-defined heart diseases: I51.89

## 2017-06-22 LAB — CBC WITH DIFFERENTIAL/PLATELET
BASOS ABS: 0.1 10*3/uL (ref 0–0.1)
Basophils Relative: 1 %
Eosinophils Absolute: 0.3 10*3/uL (ref 0–0.7)
Eosinophils Relative: 8 %
HEMATOCRIT: 32.8 % — AB (ref 40.0–52.0)
HEMOGLOBIN: 10.9 g/dL — AB (ref 13.0–18.0)
LYMPHS PCT: 24 %
Lymphs Abs: 1.1 10*3/uL (ref 1.0–3.6)
MCH: 27.2 pg (ref 26.0–34.0)
MCHC: 33.1 g/dL (ref 32.0–36.0)
MCV: 82.1 fL (ref 80.0–100.0)
MONO ABS: 0.4 10*3/uL (ref 0.2–1.0)
Monocytes Relative: 8 %
NEUTROS ABS: 2.7 10*3/uL (ref 1.4–6.5)
NEUTROS PCT: 59 %
Platelets: 151 10*3/uL (ref 150–440)
RBC: 4 MIL/uL — AB (ref 4.40–5.90)
RDW: 14.1 % (ref 11.5–14.5)
WBC: 4.5 10*3/uL (ref 3.8–10.6)

## 2017-06-22 LAB — GLUCOSE, CAPILLARY
GLUCOSE-CAPILLARY: 176 mg/dL — AB (ref 65–99)
Glucose-Capillary: 167 mg/dL — ABNORMAL HIGH (ref 65–99)

## 2017-06-22 LAB — COMPREHENSIVE METABOLIC PANEL
ALBUMIN: 3.1 g/dL — AB (ref 3.5–5.0)
ALK PHOS: 118 U/L (ref 38–126)
ALT: 23 U/L (ref 17–63)
AST: 27 U/L (ref 15–41)
Anion gap: 5 (ref 5–15)
BILIRUBIN TOTAL: 0.5 mg/dL (ref 0.3–1.2)
BUN: 19 mg/dL (ref 6–20)
CO2: 25 mmol/L (ref 22–32)
CREATININE: 1.89 mg/dL — AB (ref 0.61–1.24)
Calcium: 8.5 mg/dL — ABNORMAL LOW (ref 8.9–10.3)
Chloride: 103 mmol/L (ref 101–111)
GFR calc Af Amer: 41 mL/min — ABNORMAL LOW (ref >60)
GFR, EST NON AFRICAN AMERICAN: 35 mL/min — AB (ref >60)
GLUCOSE: 257 mg/dL — AB (ref 65–99)
Potassium: 4.8 mmol/L (ref 3.5–5.1)
Sodium: 133 mmol/L — ABNORMAL LOW (ref 135–145)
TOTAL PROTEIN: 6.5 g/dL (ref 6.5–8.1)

## 2017-06-22 LAB — TSH: TSH: 16.388 u[IU]/mL — AB (ref 0.350–4.500)

## 2017-06-22 LAB — URINALYSIS, COMPLETE (UACMP) WITH MICROSCOPIC
Bacteria, UA: NONE SEEN
Bilirubin Urine: NEGATIVE
Glucose, UA: >=500 mg/dL — AB
Hgb urine dipstick: NEGATIVE
Ketones, ur: NEGATIVE mg/dL
Leukocytes, UA: NEGATIVE
NITRITE: NEGATIVE
PH: 6 (ref 5.0–8.0)
PROTEIN: NEGATIVE mg/dL
RBC / HPF: NONE SEEN RBC/hpf (ref 0–5)
SPECIFIC GRAVITY, URINE: 1.009 (ref 1.005–1.030)
Squamous Epithelial / LPF: NONE SEEN
WBC UA: NONE SEEN WBC/hpf (ref 0–5)

## 2017-06-22 MED ORDER — HEPARIN SODIUM (PORCINE) 5000 UNIT/ML IJ SOLN
5000.0000 [IU] | Freq: Three times a day (TID) | INTRAMUSCULAR | Status: DC
  Administered 2017-06-22 – 2017-06-27 (×5): 5000 [IU] via SUBCUTANEOUS
  Filled 2017-06-22 (×8): qty 1

## 2017-06-22 MED ORDER — HYDROXYZINE HCL 10 MG PO TABS
10.0000 mg | ORAL_TABLET | Freq: Three times a day (TID) | ORAL | Status: DC | PRN
Start: 2017-06-22 — End: 2017-06-29
  Filled 2017-06-22: qty 1

## 2017-06-22 MED ORDER — ASPIRIN EC 81 MG PO TBEC
81.0000 mg | DELAYED_RELEASE_TABLET | Freq: Every day | ORAL | Status: DC
  Administered 2017-06-23 – 2017-06-29 (×7): 81 mg via ORAL
  Filled 2017-06-22 (×7): qty 1

## 2017-06-22 MED ORDER — INSULIN ASPART 100 UNIT/ML ~~LOC~~ SOLN
5.0000 [IU] | Freq: Three times a day (TID) | SUBCUTANEOUS | Status: DC
  Administered 2017-06-22 – 2017-06-24 (×5): 5 [IU] via SUBCUTANEOUS
  Filled 2017-06-22 (×5): qty 1

## 2017-06-22 MED ORDER — HYDROCODONE-ACETAMINOPHEN 5-325 MG PO TABS: 1.0000 | ORAL_TABLET | Freq: Four times a day (QID) | ORAL | Status: DC | PRN

## 2017-06-22 MED ORDER — LEVOTHYROXINE SODIUM 100 MCG PO TABS
200.0000 ug | ORAL_TABLET | Freq: Every day | ORAL | Status: DC
  Administered 2017-06-23 – 2017-06-29 (×7): 200 ug via ORAL
  Filled 2017-06-22 (×7): qty 2

## 2017-06-22 MED ORDER — ENSURE ENLIVE PO LIQD
237.0000 mL | Freq: Three times a day (TID) | ORAL | Status: DC
  Administered 2017-06-22 – 2017-06-26 (×11): 237 mL via ORAL

## 2017-06-22 MED ORDER — DOCUSATE SODIUM 100 MG PO CAPS: 100.0000 mg | ORAL_CAPSULE | Freq: Two times a day (BID) | ORAL | Status: DC | PRN

## 2017-06-22 MED ORDER — GABAPENTIN 100 MG PO CAPS
100.0000 mg | ORAL_CAPSULE | Freq: Three times a day (TID) | ORAL | Status: DC
  Administered 2017-06-22 – 2017-06-29 (×21): 100 mg via ORAL
  Filled 2017-06-22 (×21): qty 1

## 2017-06-22 MED ORDER — INSULIN GLARGINE 100 UNIT/ML ~~LOC~~ SOLN
10.0000 [IU] | Freq: Every day | SUBCUTANEOUS | Status: DC
  Administered 2017-06-22 – 2017-06-26 (×5): 10 [IU] via SUBCUTANEOUS
  Filled 2017-06-22 (×5): qty 0.1

## 2017-06-22 MED ORDER — SODIUM CHLORIDE 0.9 % IV BOLUS (SEPSIS)
1000.0000 mL | Freq: Once | INTRAVENOUS | Status: AC
  Administered 2017-06-22: 1000 mL via INTRAVENOUS

## 2017-06-22 MED ORDER — SODIUM CHLORIDE 0.9 % IV SOLN
INTRAVENOUS | Status: AC
  Administered 2017-06-22 – 2017-06-23 (×2): via INTRAVENOUS

## 2017-06-22 MED ORDER — ATORVASTATIN CALCIUM 20 MG PO TABS
40.0000 mg | ORAL_TABLET | Freq: Every day | ORAL | Status: DC
  Administered 2017-06-22 – 2017-06-28 (×7): 40 mg via ORAL
  Filled 2017-06-22 (×9): qty 2

## 2017-06-22 MED ORDER — POLYVINYL ALCOHOL 1.4 % OP SOLN
1.0000 [drp] | Freq: Three times a day (TID) | OPHTHALMIC | Status: DC | PRN
  Filled 2017-06-22: qty 15

## 2017-06-22 MED ORDER — CITALOPRAM HYDROBROMIDE 20 MG PO TABS
20.0000 mg | ORAL_TABLET | Freq: Every day | ORAL | Status: DC
  Administered 2017-06-23 – 2017-06-29 (×7): 20 mg via ORAL
  Filled 2017-06-22 (×7): qty 1

## 2017-06-22 MED ORDER — PANCRELIPASE (LIP-PROT-AMYL) 12000-38000 UNITS PO CPEP
24000.0000 [IU] | ORAL_CAPSULE | Freq: Three times a day (TID) | ORAL | Status: DC
Start: 2017-06-23 — End: 2017-06-26
  Administered 2017-06-23 – 2017-06-26 (×10): 24000 [IU] via ORAL
  Filled 2017-06-22 (×10): qty 2

## 2017-06-22 MED ORDER — INSULIN ASPART 100 UNIT/ML ~~LOC~~ SOLN: 5.0000 [IU] | Freq: Three times a day (TID) | SUBCUTANEOUS | Status: DC

## 2017-06-22 NOTE — ED Provider Notes (Signed)
[facility] Emergency Department Provider Note  ____________________________________________  Time seen: Approximately 3:56 PM  I have reviewed the triage vital signs and the nursing notes.   HISTORY  Chief Complaint Hypotension   HPI Cameron Adams is a 67 y.o. male with a history of chronic kidney disease, diabetes, hypertension, hypothyroidism who presents for evaluation of hypotension. Patient was discharged from the hospital 3 days ago for hypotension in the setting of diarrhea. He continues to have diarrhea home. Today went to see his primary care doctor was found to be hypotensive with systolics in the 57S and sent to the emergency room for further evaluation. Patient reports feeling very dizzy especially with standing up and has had several episodes of near syncope. No true loss of consciousness. He reports 2 episodes of watery diarrhea today. No melena, no bloody stools. No nausea, no vomiting. He has been taking Creon at home with no improvement. No abdominal pain.  Past Medical History:  Diagnosis Date  . Blindness of left eye   . Chronic kidney disease, stage III (moderate)   . Diabetes ([place])   . Hypertension   . Syncope and collapse   . Thyroid dysfunction     Patient Active Problem List   Diagnosis Date Noted  . Hyperkalemia 06/16/2017  . Esophageal dysmotilities   . Dysphagia   . Rib fractures 05/17/2017  . Hemothorax 05/17/2017  . Recurrent major depressive disorder, in partial remission ([place]) 05/13/2017  . Pure hypercholesterolemia 05/13/2017  . IDDM (insulin dependent diabetes mellitus) ([place]) 05/13/2017  . Hypothyroidism (acquired) 05/13/2017  . GAD (generalized anxiety disorder) 05/13/2017  . CKD (chronic kidney disease) stage 3, GFR 30-59 ml/min 05/13/2017  . Toe gangrene ([place]) 09/16/2016  . PVD (peripheral vascular disease) ([place]) 09/16/2016  . Uncontrolled diabetes mellitus ([place]) 09/16/2016  . Hyponatremia 09/16/2016  .  Hypokalemia 09/16/2016  . Essential hypertension, malignant 09/16/2016  . Abnormal angiogram 09/16/2016  . Gangrene ([place]) 09/13/2016  . Pruritus 03/26/2016  . Orthostatic hypotension 03/26/2016  . 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 Alcohol abuse 03/26/2016  . Hyperglycemia 03/23/2016  . Passive suicidal ideations 09/16/2014  . Dyspnea 11/09/2013  . CAP (community acquired pneumonia) 10/31/2013  . Chest pain 10/31/2013  . Chronic respiratory failure with hypoxia ([place]) 10/31/2013  . 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 Diabetic retinopathy ([place]) 06/16/2013  . Gynecomastia 04/18/2013    Past Surgical History:  Procedure Laterality Date  . 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 PERIPHERAL VASCULAR CATHETERIZATION N/A 09/15/2016   Procedure: Lower Extremity Angiography;  Surgeon: Algernon Huxley, MD;  Location: [facility];  Service: Cardiovascular;  Laterality: N/A;  . THYROID SURGERY      Prior to Admission medications   Medication Sig Start Date End Date Taking? Authorizing Provider  aspirin EC 81 MG tablet Take 81 mg by mouth daily.    [provider]  atorvastatin (LIPITOR) 40 MG tablet Take 40 mg by mouth at bedtime.    [provider]  citalopram (CELEXA) 20 MG tablet Take 20 mg by mouth daily.    [provider]  feeding supplement, ENSURE ENLIVE, (ENSURE ENLIVE) LIQD Take 237 mLs by mouth 3 (three) times daily between meals. 05/20/17   Vaughan Basta, MD  gabapentin (NEURONTIN) 100 MG capsule TAKE 1-3 CAPSULES BY MOUTH BEFORE BEDTIME FOR ARM PAIN 01/14/17  [provider]  HYDROcodone-acetaminophen (NORCO) 5-325 MG tablet Take 1 tablet by mouth every 6 (six) hours as needed for moderate pain. 05/20/17   Vaughan Basta, MD  hydroxypropyl methylcellulose / hypromellose  (ISOPTO TEARS / GONIOVISC) 2.5 % ophthalmic solution Place 1 drop into both eyes 3 (three) times daily as needed for dry eyes.    [provider]  hydrOXYzine (ATARAX/VISTARIL) 10 MG tablet Take 10 mg by mouth every 8 (eight) hours as needed for itching.     [provider]  insulin aspart (NOVOLOG) 100 UNIT/ML injection Inject 5 Units into the skin 3 (three) times daily before meals.    [provider]  insulin glargine (LANTUS) 100 UNIT/ML injection Inject 0.1 mLs (10 Units total) into the skin at bedtime. 06/19/17   Vaughan Basta, MD  levothyroxine (SYNTHROID, LEVOTHROID) 200 MCG tablet Take 1 tablet (200 mcg total) by mouth daily before breakfast. 05/21/17   Vaughan Basta, MD  lipase/protease/amylase (CREON) 12000 units CPEP capsule Take 24,000 Units by mouth 3 (three) times daily with meals.     [provider]  pantoprazole sodium (PROTONIX) 40 mg/20 mL PACK Take 20 mLs (40 mg total) by mouth 2 (two) times daily before a meal. Patient not taking: Reported on 06/16/2017 05/20/17   Vaughan Basta, MD    Allergies Patient has no known allergies.  Family History  Problem Relation Age of Onset  . Kidney failure Mother   . Heart disease Father   . Kidney failure Father   . Heart disease Brother     Social History Social History  Substance Use Topics  . Smoking status: Former Smoker    Packs/day: 0.10    Years: 25.00    Types: Cigarettes    Quit date: 10/31/1978  . Smokeless tobacco: Never Used     Comment: smoked 2-3 cigarettes/day  . Alcohol use No    Review of Systems  Constitutional: Negative for fever. + lightheadedness Eyes: Negative for visual changes. ENT: Negative for sore throat. Neck: No neck pain  Cardiovascular: Negative for chest pain. Respiratory: Negative for shortness of breath. Gastrointestinal: Negative for abdominal pain, vomiting. + diarrhea. Genitourinary: Negative for dysuria. Musculoskeletal:  Negative for back pain. Skin: Negative for rash. Neurological: Negative for headaches, weakness or numbness. Psych: No SI or HI  ____________________________________________   PHYSICAL EXAM:  VITAL SIGNS: ED Triage Vitals  Enc Vitals Group     BP 06/22/17 1436 (!) 94/54     Pulse Rate 06/22/17 1436 70     Resp 06/22/17 1436 18     Temp 06/22/17 1436 98.2 F (36.8 C)     Temp Source 06/22/17 1436 Oral     SpO2 06/22/17 1436 100 %     Weight 06/22/17 1443 170 lb (77.1 kg)     Height --      Head Circumference --      Peak Flow --      Pain Score 06/22/17 1435 0     Pain Loc --      Pain Edu? --      Excl. in [place]? --     Constitutional: Alert and oriented. Well appearing and in no apparent distress. HEENT:      Head: Normocephalic and atraumatic.         Eyes: Conjunctivae are normal. Sclera is non-icteric.       Mouth/Throat: Mucous membranes are moist.       Neck: Supple with no signs of meningismus. Cardiovascular: Regular rate  and rhythm. No murmurs, gallops, or rubs. 2+ symmetrical distal pulses are present in all extremities. No JVD. Respiratory: Normal respiratory effort. Lungs are clear to auscultation bilaterally. No wheezes, crackles, or rhonchi.  Gastrointestinal: Soft, non tender, and non distended with positive bowel sounds. No rebound or guarding. Musculoskeletal: Nontender with normal range of motion in all extremities. No edema, cyanosis, or erythema of extremities. Neurologic: Normal speech and language. Face is symmetric. Moving all extremities. No gross focal neurologic deficits are appreciated. Skin: Skin is warm, dry and intact. No rash noted. Psychiatric: Mood and affect are normal. Speech and behavior are normal.  ____________________________________________   LABS (all labs ordered are listed, but only abnormal results are displayed)  Labs Reviewed  CBC WITH DIFFERENTIAL/PLATELET - Abnormal; Notable for the following:       Result Value   RBC  4.00 (*)    Hemoglobin 10.9 (*)    HCT 32.8 (*)    All other components within normal limits  COMPREHENSIVE METABOLIC PANEL - Abnormal; Notable for the following:    Sodium 133 (*)    Glucose, Bld 257 (*)    Creatinine, Ser 1.89 (*)    Calcium 8.5 (*)    Albumin 3.1 (*)    GFR calc non Af Amer 35 (*)    GFR calc Af Amer 41 (*)    All other components within normal limits  URINALYSIS, COMPLETE (UACMP) WITH MICROSCOPIC   ____________________________________________  EKG  ED ECG REPORT I, Rudene Re, the attending physician, personally viewed and interpreted this ECG.  Normal sinus rhythm, rate of 66, normal intervals, normal axis, no ST elevations or depressions.  ____________________________________________  RADIOLOGY  none  ____________________________________________   PROCEDURES  Procedure(s) performed: None Procedures Critical Care performed:  None ____________________________________________   INITIAL IMPRESSION / ASSESSMENT AND PLAN / ED COURSE   66 y.o. male with a history of chronic kidney disease, diabetes, hypertension, hypothyroidism who presents for evaluation of hypotension and near syncope in the setting of diarrhea for several weeks. patient orthostatic with blood pressure 114/64 laying and 88/54 sitting. Patient too dizzy to stand. IVF started. Not on antihypertensives. No abdominal pain. Labs showing worsening kidney function with creatinine of 1.89 (baseline 1.48). Electrolytes WNL. WBC and hgb at baseline. Patient's BP is now in the 180-190s after 1L NS however still orthostatic as it drops to 140s with standing. Due to labile BP, hypotension, AKI patient will be admitted to the Hospitalist     Pertinent labs & imaging results that were available during my care of the patient were reviewed by me and considered in my medical decision making (see chart for details).    ____________________________________________   FINAL CLINICAL  IMPRESSION(S) / ED DIAGNOSES  Final diagnoses:  Hypotension, unspecified hypotension type  AKI (acute kidney injury) ([place])  Diarrhea, unspecified type      NEW MEDICATIONS STARTED DURING THIS VISIT:  New Prescriptions   No medications on file     Note:  This document was prepared using Dragon voice recognition software and may include unintentional dictation errors.    Alfred Levins, [place], MD 06/22/17 [phone]

## 2017-06-22 NOTE — ED Triage Notes (Addendum)
Pt to ed from [facility] for hypotension.  Pt with bp 94/54, and 89/45 in right arm.  Pt bp 67/44 and 85/43 in left arm.  Pt reports dizziness and weakness with standing and movement.

## 2017-06-22 NOTE — ED Notes (Signed)
Pt aware of need for urine specimen. 

## 2017-06-22 NOTE — H&P (Signed)
[place] [facility] [place] NAME: Cameron Adams    MR#:  [id number]  DATE OF BIRTH:  13-Mar-1951  DATE OF ADMISSION:  06/22/2017  PRIMARY CARE PHYSICIAN: [name], MD   REQUESTING/REFERRING PHYSICIAN:   CHIEF COMPLAINT:   Chief Complaint  Patient presents with  . Hypotension    HISTORY OF PRESENT ILLNESS: Cameron Adams  is a 66 y.o. male with a known history of CKD, Htn, DM, Hypothyroidism- Multiple admissions for labile BP and blood sugar. Sent home 3 days ago, came back again from PMD's office today, as he had low BP and feeling dizzi. C/o intermittent diarrhea. During last 2 admissions I saw him, and taken off his BP meds, decreased lantus dose, started on levothyroxine for high TSH, still the complains persists of labile BP and Blood sugar.  PAST MEDICAL HISTORY:   Past Medical History:  Diagnosis Date  . Blindness of left eye   . Chronic kidney disease, stage III (moderate)   . Diabetes ([place])   . Hypertension   . Syncope and collapse   . Thyroid dysfunction     PAST SURGICAL HISTORY: Past Surgical History:  Procedure Laterality Date  . 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 PERIPHERAL VASCULAR CATHETERIZATION N/A 09/15/2016   Procedure: Lower Extremity Angiography;  Surgeon: Algernon Huxley, MD;  Location: [facility];  Service: Cardiovascular;  Laterality: N/A;  . THYROID SURGERY      SOCIAL HISTORY:  Social History  Substance Use Topics  . Smoking status: Former Smoker    Packs/day: 0.10    Years: 25.00    Types: Cigarettes    Quit date: 10/31/1978  . Smokeless tobacco: Never Used     Comment: smoked 2-3 cigarettes/day  . Alcohol use No    FAMILY HISTORY:  Family History  Problem Relation Age of Onset  . Kidney failure Mother   . Heart disease Father   . Kidney failure Father   .  Heart disease Brother     DRUG ALLERGIES: No Known Allergies  REVIEW OF SYSTEMS:   CONSTITUTIONAL: No fever, positive for fatigue or weakness.  EYES: No blurred or double vision.  EARS, NOSE, AND THROAT: No tinnitus or ear pain.  RESPIRATORY: No cough, shortness of breath, wheezing or hemoptysis.  CARDIOVASCULAR: No chest pain, orthopnea, edema.  GASTROINTESTINAL: No nausea, vomiting, diarrhea or abdominal pain.  GENITOURINARY: No dysuria, hematuria.  ENDOCRINE: No polyuria, nocturia,  HEMATOLOGY: No anemia, easy bruising or bleeding SKIN: No rash or lesion. MUSCULOSKELETAL: No joint pain or arthritis.   NEUROLOGIC: No tingling, numbness, weakness.  PSYCHIATRY: No anxiety or depression.   MEDICATIONS AT HOME:  Prior to Admission medications   Medication Sig Start Date End Date Taking? Authorizing Provider  aspirin EC 81 MG tablet Take 81 mg by mouth daily.    [provider]  atorvastatin (LIPITOR) 40 MG tablet Take 40 mg by mouth at bedtime.    [provider]  citalopram (CELEXA) 20 MG tablet Take 20 mg by mouth daily.    [provider]  feeding supplement, ENSURE ENLIVE, (ENSURE ENLIVE) LIQD Take 237 mLs by mouth 3 (three) times daily between meals. 05/20/17   Vaughan Basta, MD  gabapentin (NEURONTIN) 100 MG capsule TAKE 1-3 CAPSULES BY MOUTH BEFORE BEDTIME FOR ARM PAIN 01/14/17   [provider]  HYDROcodone-acetaminophen [facility]) 5-325  MG tablet Take 1 tablet by mouth every 6 (six) hours as needed for moderate pain. 05/20/17   Vaughan Basta, MD  hydroxypropyl methylcellulose / hypromellose (ISOPTO TEARS / GONIOVISC) 2.5 % ophthalmic solution Place 1 drop into both eyes 3 (three) times daily as needed for dry eyes.    [provider]  hydrOXYzine (ATARAX/VISTARIL) 10 MG tablet Take 10 mg by mouth every 8 (eight) hours as needed for itching.     [provider]  insulin aspart (NOVOLOG) 100 UNIT/ML injection  Inject 5 Units into the skin 3 (three) times daily before meals.    [provider]  insulin glargine (LANTUS) 100 UNIT/ML injection Inject 0.1 mLs (10 Units total) into the skin at bedtime. 06/19/17   Vaughan Basta, MD  levothyroxine (SYNTHROID, LEVOTHROID) 200 MCG tablet Take 1 tablet (200 mcg total) by mouth daily before breakfast. 05/21/17   Vaughan Basta, MD  lipase/protease/amylase (CREON) 12000 units CPEP capsule Take 24,000 Units by mouth 3 (three) times daily with meals.     [provider]  pantoprazole sodium (PROTONIX) 40 mg/20 mL PACK Take 20 mLs (40 mg total) by mouth 2 (two) times daily before a meal. Patient not taking: Reported on 06/16/2017 05/20/17   Vaughan Basta, MD      PHYSICAL EXAMINATION:   VITAL SIGNS: Blood pressure (!) 188/87, pulse 63, temperature 98.2 F (36.8 C), temperature source Oral, resp. rate 12, weight 77.1 kg (170 lb), SpO2 100 %.  GENERAL:  66 y.o.-year-old patient lying in the bed with no acute distress.  EYES: Pupils equal, round, reactive to light and accommodation. No scleral icterus. Extraocular muscles intact.  HEENT: Head atraumatic, normocephalic. Oropharynx and nasopharynx clear.  NECK:  Supple, no jugular venous distention. No thyroid enlargement, no tenderness.  LUNGS: Normal breath sounds bilaterally, no wheezing, rales,rhonchi or crepitation. No use of accessory muscles of respiration.  CARDIOVASCULAR: S1, S2 normal. No murmurs, rubs, or gallops.  ABDOMEN: Soft, nontender, nondistended. Bowel sounds present. No organomegaly or mass.  EXTREMITIES: No pedal edema, cyanosis, or clubbing.  NEUROLOGIC: Cranial nerves II through XII are intact. Muscle strength 5/5 in all extremities. Sensation intact. Gait not checked.  PSYCHIATRIC: The patient is alert and oriented x 3.  SKIN: No obvious rash, lesion, or ulcer.   LABORATORY PANEL:   CBC  Recent Labs Lab 06/16/17 1431 06/17/17 0605 06/22/17 1527   WBC 5.3 3.9 4.5  HGB 10.9* 11.2* 10.9*  HCT 32.8* 33.3* 32.8*  PLT 164 154 151  MCV 81.3 82.3 82.1  MCH 26.9 27.5 27.2  MCHC 33.1 33.5 33.1  RDW 13.9 14.0 14.1  LYMPHSABS  --   --  1.1  MONOABS  --   --  0.4  EOSABS  --   --  0.3  BASOSABS  --   --  0.1   ------------------------------------------------------------------------------------------------------------------  Chemistries   Recent Labs Lab 06/16/17 1431  06/17/17 0605 06/18/17 0332 06/18/17 0830 06/19/17 0342 06/22/17 1527  NA 131*  --  137 136  --  140 133*  K 6.4*  < > 5.4* 6.3* 5.1 5.0 4.8  CL 103  --  109 107  --  110 103  CO2 21*  --  25 25  --  26 25  GLUCOSE 409*  --  280* 303*  --  230* 257*  BUN 36*  --  27* 26*  --  26* 19  CREATININE 2.05*  --  1.66* 1.60*  --  1.48* 1.89*  CALCIUM 8.6*  --  8.3* 8.6*  --  8.5* 8.5*  AST 26  --   --   --   --   --  27  ALT 15*  --   --   --   --   --  23  ALKPHOS 140*  --   --   --   --   --  118  BILITOT 0.6  --   --   --   --   --  0.5  < > = values in this interval not displayed. ------------------------------------------------------------------------------------------------------------------ estimated creatinine clearance is 40.4 mL/min (A) (by C-G formula based on SCr of 1.89 mg/dL (H)). ------------------------------------------------------------------------------------------------------------------ No results for input(s): TSH, T4TOTAL, T3FREE, THYROIDAB in the last 72 hours.  Invalid input(s): FREET3   Coagulation profile No results for input(s): INR, PROTIME in the last 168 hours. ------------------------------------------------------------------------------------------------------------------- No results for input(s): DDIMER in the last 72 hours. -------------------------------------------------------------------------------------------------------------------  Cardiac Enzymes No results for input(s): CKMB, TROPONINI, MYOGLOBIN in the last 168  hours.  Invalid input(s): CK ------------------------------------------------------------------------------------------------------------------ Invalid input(s): POCBNP  ---------------------------------------------------------------------------------------------------------------  Urinalysis    Component Value Date/Time   COLORURINE YELLOW (A) 06/16/2017 1431   APPEARANCEUR CLEAR (A) 06/16/2017 1431   LABSPEC 1.014 06/16/2017 1431   PHURINE 5.0 06/16/2017 1431   GLUCOSEU >=500 (A) 06/16/2017 1431   HGBUR NEGATIVE 06/16/2017 1431   BILIRUBINUR NEGATIVE 06/16/2017 1431   KETONESUR NEGATIVE 06/16/2017 1431   PROTEINUR NEGATIVE 06/16/2017 1431   NITRITE NEGATIVE 06/16/2017 1431   LEUKOCYTESUR NEGATIVE 06/16/2017 1431     RADIOLOGY: No results found.  EKG: Orders placed or performed during the hospital encounter of 06/22/17  . ED EKG  . ED EKG  . EKG 12-Lead  . EKG 12-Lead    IMPRESSION AND PLAN:  * Hypotension   Dizziness      Not on Htn meds.    Here in ER BP is again normal.   This happened in past also     I spoke to Endocrinologist in [facility] clinic on phone.     He suggested to check for ACTH, Cortisol, Metanephrines , VIP.    We had started on levothyroxine in last month due to high TSH, repeat TSH now.   * Ac on CKD stage 3 IV fluids , monitor. Improved.  * DM ISS.   Keep long acting insuline, I had decreased dose last admission- due to Hypoglycemia  * Dysphagia Have esophageal issues Barium study done last time, GI suggest to come in office.  * MGUS    As per past records    Call oncology consult.  All the records are reviewed and case discussed with ED provider. Management plans discussed with the patient, family and they are in agreement.  CODE STATUS: Full. Code Status History    Date Active Date Inactive Code Status Order ID Comments User Context   06/16/2017  6:49 PM 06/19/2017  6:41 PM Full Code [id number]  Vaughan Basta,  MD Inpatient   05/17/2017  3:50 PM 05/20/2017  5:44 PM Full Code [id number]  Idelle Crouch, MD Inpatient   09/13/2016  5:56 PM 09/16/2016  6:56 PM Full Code [id number]  Fritzi Mandes, MD Inpatient       TOTAL TIME TAKING CARE OF THIS PATIENT: 55 minutes.    Vaughan Basta M.D on 06/22/2017   Between 7am to 6pm - Pager - [phone]  After 6pm go to [email or web address] - password [email or web address] [place]  Office  [phone]  CC: Primary care physician;  [name], MD   Note: This dictation was prepared with Dragon dictation along with smaller phrase technology. Any transcriptional errors that result from this process are unintentional.

## 2017-06-23 ENCOUNTER — Observation Stay (HOSPITAL_BASED_OUTPATIENT_CLINIC_OR_DEPARTMENT_OTHER)
Admit: 2017-06-23 | Discharge: 2017-06-23 | Disposition: A | Discharge status: Home / Self Care | Payer: Medicare Other | Attending: Internal Medicine | Admitting: Internal Medicine

## 2017-06-23 DIAGNOSIS — R531 Weakness: Secondary | ICD-10-CM

## 2017-06-23 DIAGNOSIS — R5383 Other fatigue: Secondary | ICD-10-CM | POA: Diagnosis not present

## 2017-06-23 DIAGNOSIS — D472 Monoclonal gammopathy: Secondary | ICD-10-CM | POA: Diagnosis not present

## 2017-06-23 DIAGNOSIS — Z794 Long term (current) use of insulin: Secondary | ICD-10-CM

## 2017-06-23 DIAGNOSIS — I361 Nonrheumatic tricuspid (valve) insufficiency: Secondary | ICD-10-CM | POA: Diagnosis not present

## 2017-06-23 DIAGNOSIS — F17210 Nicotine dependence, cigarettes, uncomplicated: Secondary | ICD-10-CM | POA: Diagnosis not present

## 2017-06-23 DIAGNOSIS — I129 Hypertensive chronic kidney disease with stage 1 through stage 4 chronic kidney disease, or unspecified chronic kidney disease: Secondary | ICD-10-CM

## 2017-06-23 DIAGNOSIS — Z79899 Other long term (current) drug therapy: Secondary | ICD-10-CM

## 2017-06-23 DIAGNOSIS — Z7982 Long term (current) use of aspirin: Secondary | ICD-10-CM

## 2017-06-23 DIAGNOSIS — I959 Hypotension, unspecified: Secondary | ICD-10-CM

## 2017-06-23 DIAGNOSIS — N183 Chronic kidney disease, stage 3 (moderate): Secondary | ICD-10-CM

## 2017-06-23 DIAGNOSIS — E1122 Type 2 diabetes mellitus with diabetic chronic kidney disease: Secondary | ICD-10-CM | POA: Diagnosis not present

## 2017-06-23 DIAGNOSIS — D631 Anemia in chronic kidney disease: Secondary | ICD-10-CM

## 2017-06-23 DIAGNOSIS — H5440 Blindness, one eye, unspecified eye: Secondary | ICD-10-CM

## 2017-06-23 DIAGNOSIS — R5381 Other malaise: Secondary | ICD-10-CM

## 2017-06-23 DIAGNOSIS — R197 Diarrhea, unspecified: Secondary | ICD-10-CM | POA: Diagnosis not present

## 2017-06-23 LAB — GASTROINTESTINAL PANEL BY PCR, STOOL (REPLACES STOOL CULTURE)
ADENOVIRUS F40/41: NOT DETECTED
Astrovirus: NOT DETECTED
Campylobacter species: NOT DETECTED
Cryptosporidium: NOT DETECTED
Cyclospora cayetanensis: NOT DETECTED
ENTAMOEBA HISTOLYTICA: NOT DETECTED
ENTEROTOXIGENIC E COLI (ETEC): NOT DETECTED
Enteroaggregative E coli (EAEC): NOT DETECTED
Enteropathogenic E coli (EPEC): NOT DETECTED
GIARDIA LAMBLIA: NOT DETECTED
NOROVIRUS GI/GII: NOT DETECTED
Plesimonas shigelloides: NOT DETECTED
ROTAVIRUS A: NOT DETECTED
SAPOVIRUS (I, II, IV, AND V): NOT DETECTED
SHIGELLA/ENTEROINVASIVE E COLI (EIEC): NOT DETECTED
Salmonella species: NOT DETECTED
Shiga like toxin producing E coli (STEC): NOT DETECTED
Vibrio cholerae: NOT DETECTED
Vibrio species: NOT DETECTED
Yersinia enterocolitica: NOT DETECTED

## 2017-06-23 LAB — BASIC METABOLIC PANEL
Anion gap: 5 (ref 5–15)
BUN: 22 mg/dL — AB (ref 6–20)
CHLORIDE: 106 mmol/L (ref 101–111)
CO2: 25 mmol/L (ref 22–32)
CREATININE: 1.73 mg/dL — AB (ref 0.61–1.24)
Calcium: 8.4 mg/dL — ABNORMAL LOW (ref 8.9–10.3)
GFR calc Af Amer: 45 mL/min — ABNORMAL LOW (ref >60)
GFR calc non Af Amer: 39 mL/min — ABNORMAL LOW (ref >60)
Glucose, Bld: 239 mg/dL — ABNORMAL HIGH (ref 65–99)
POTASSIUM: 4.8 mmol/L (ref 3.5–5.1)
SODIUM: 136 mmol/L (ref 135–145)

## 2017-06-23 LAB — CBC
HEMATOCRIT: 31.9 % — AB (ref 40.0–52.0)
Hemoglobin: 10.7 g/dL — ABNORMAL LOW (ref 13.0–18.0)
MCH: 27.5 pg (ref 26.0–34.0)
MCHC: 33.7 g/dL (ref 32.0–36.0)
MCV: 81.7 fL (ref 80.0–100.0)
Platelets: 143 10*3/uL — ABNORMAL LOW (ref 150–440)
RBC: 3.9 MIL/uL — ABNORMAL LOW (ref 4.40–5.90)
RDW: 13.8 % (ref 11.5–14.5)
WBC: 4.6 10*3/uL (ref 3.8–10.6)

## 2017-06-23 LAB — GLUCOSE, CAPILLARY
GLUCOSE-CAPILLARY: 37 mg/dL — AB (ref 65–99)
GLUCOSE-CAPILLARY: 195 mg/dL — AB (ref 65–99)
Glucose-Capillary: 229 mg/dL — ABNORMAL HIGH (ref 65–99)
Glucose-Capillary: 226 mg/dL — ABNORMAL HIGH (ref 65–99)
Glucose-Capillary: 81 mg/dL (ref 65–99)
Glucose-Capillary: 54 mg/dL — ABNORMAL LOW (ref 65–99)

## 2017-06-23 LAB — C DIFFICILE QUICK SCREEN W PCR REFLEX
C DIFFICILE (CDIFF) INTERP: NOT DETECTED
C DIFFICILE (CDIFF) TOXIN: NEGATIVE
C Diff antigen: NEGATIVE

## 2017-06-23 MED ORDER — INSULIN ASPART 100 UNIT/ML ~~LOC~~ SOLN
0.0000 [IU] | Freq: Three times a day (TID) | SUBCUTANEOUS | Status: DC
  Administered 2017-06-23: 13:00:00 3 [IU] via SUBCUTANEOUS
  Administered 2017-06-24: 12:00:00 2 [IU] via SUBCUTANEOUS
  Administered 2017-06-24: 5 [IU] via SUBCUTANEOUS
  Administered 2017-06-25 (×2): 3 [IU] via SUBCUTANEOUS
  Administered 2017-06-26: 10:00:00 7 [IU] via SUBCUTANEOUS
  Administered 2017-06-26 (×2): 2 [IU] via SUBCUTANEOUS
  Administered 2017-06-27 (×2): 3 [IU] via SUBCUTANEOUS
  Administered 2017-06-28: 09:00:00 5 [IU] via SUBCUTANEOUS
  Administered 2017-06-28 – 2017-06-29 (×2): 3 [IU] via SUBCUTANEOUS
  Administered 2017-06-29: 2 [IU] via SUBCUTANEOUS
  Filled 2017-06-23 (×15): qty 1

## 2017-06-23 MED ORDER — ACETAMINOPHEN 325 MG PO TABS: 650.0000 mg | ORAL_TABLET | Freq: Four times a day (QID) | ORAL | Status: DC | PRN

## 2017-06-23 MED ORDER — AMLODIPINE BESYLATE 5 MG PO TABS
5.0000 mg | ORAL_TABLET | Freq: Every day | ORAL | Status: DC
  Administered 2017-06-24: 5 mg via ORAL
  Filled 2017-06-23: qty 1

## 2017-06-23 MED ORDER — INSULIN ASPART 100 UNIT/ML ~~LOC~~ SOLN
0.0000 [IU] | Freq: Every day | SUBCUTANEOUS | Status: DC
  Administered 2017-06-27: 21:00:00 0 [IU] via SUBCUTANEOUS
  Administered 2017-06-28: 2 [IU] via SUBCUTANEOUS

## 2017-06-23 NOTE — Progress Notes (Signed)
Inpatient Diabetes Program Recommendations  AACE/ADA: New Consensus Statement on Inpatient Glycemic Control (2015)  Target Ranges:  Prepandial:   less than 140 mg/dL      Peak postprandial:   less than 180 mg/dL (1-2 hours)      Critically ill patients:  140 - 180 mg/dL   Results for YAMEN, CASTROGIOVANNI (MRN [id number]) as of 06/23/2017 09:25  Ref. Range 06/22/2017 18:06 06/22/2017 21:34 06/23/2017 07:30  Glucose-Capillary Latest Ref Range: 65 - 99 mg/dL 176 (H) 167 (H) 229 (H)   Review of Glycemic Control  Diabetes history: DM2 Outpatient Diabetes medications: Lantus 10 units daily, Novolog 5 units TID with meals Current orders for Inpatient glycemic control: Lantus 10 units daily, Novolog 5 units TID with meals  Inpatient Diabetes Program Recommendations: Correction (SSI): Please consider ordering Novolog 0-9 units TID with meals and Novolog 0-5 units QHS for bedtime correction.  Thanks, Barnie Alderman, RN, MSN, CDE Diabetes Coordinator Inpatient Diabetes Program [phone] (Team Pager from 8am to 5pm)

## 2017-06-23 NOTE — Progress Notes (Signed)
[place] at [place] NAME: Cameron Adams    MR#:  [id number]  DATE OF BIRTH:  10-Jun-1951  SUBJECTIVE:  CHIEF COMPLAINT:  Patient is reporting dizziness when he stands up and diarrhea with loose watery bowel movements  REVIEW OF SYSTEMS:  CONSTITUTIONAL: No fever, fatigue or weakness.  EYES: No blurred or double vision.  EARS, NOSE, AND THROAT: No tinnitus or ear pain.  RESPIRATORY: No cough, shortness of breath, wheezing or hemoptysis.  CARDIOVASCULAR: No chest pain, orthopnea, edema.  GASTROINTESTINAL: No nausea, vomiting, Reporting diarrhea but  no abdominal pain GENITOURINARY: No dysuria, .hematuria.  ENDOCRINE: No polyuria, nocturia,  HEMATOLOGY: No anemia, easy bruising or bleeding SKIN: No rash or lesion. MUSCULOSKELETAL: No joint pain or arthritis.   NEUROLOGIC: No tingling, numbness, weakness.  PSYCHIATRY: No anxiety or depression.   DRUG ALLERGIES:  No Known Allergies  VITALS:  Blood pressure (!) 187/75, pulse 70, temperature 97.9 F (36.6 C), temperature source Oral, resp. rate 20, height 5\' 11"  (1.803 m), weight 77.1 kg (170 lb), SpO2 100 %.  PHYSICAL EXAMINATION:  GENERAL:  66 y.o.-year-old patient lying in the bed with no acute distress.  EYES: Pupils equal, round, reactive to light and accommodation. No scleral icterus. Extraocular muscles intact.  HEENT: Head atraumatic, normocephalic. Oropharynx and nasopharynx clear.  NECK:  Supple, no jugular venous distention. No thyroid enlargement, no tenderness.  LUNGS: Normal breath sounds bilaterally, no wheezing, rales,rhonchi or crepitation. No use of accessory muscles of respiration.  CARDIOVASCULAR: S1, S2 normal. No murmurs, rubs, or gallops.  ABDOMEN: Soft, nontender, nondistended. Bowel sounds present. No organomegaly or mass.  EXTREMITIES: No pedal edema, cyanosis, or clubbing.  NEUROLOGIC: Cranial nerves II through XII are intact. Muscle strength 5/5 in all  extremities. Sensation intact. Gait not checked.  PSYCHIATRIC: The patient is alert and oriented x 3.  SKIN: No obvious rash, lesion, or ulcer.    LABORATORY PANEL:   CBC  Recent Labs Lab 06/23/17 1028  WBC 4.6  HGB 10.7*  HCT 31.9*  PLT 143*   ------------------------------------------------------------------------------------------------------------------  Chemistries   Recent Labs Lab 06/22/17 1527 06/23/17 1028  NA 133* 136  K 4.8 4.8  CL 103 106  CO2 25 25  GLUCOSE 257* 239*  BUN 19 22*  CREATININE 1.89* 1.73*  CALCIUM 8.5* 8.4*  AST 27  --   ALT 23  --   ALKPHOS 118  --   BILITOT 0.5  --    ------------------------------------------------------------------------------------------------------------------  Cardiac Enzymes No results for input(s): TROPONINI in the last 168 hours. ------------------------------------------------------------------------------------------------------------------  RADIOLOGY:  No results found.  EKG:   Orders placed or performed during the hospital encounter of 06/22/17  . ED EKG  . ED EKG  . EKG 12-Lead  . EKG 12-Lead    ASSESSMENT AND PLAN:     #Hypotension currently patient is hypertensive with elevated blood pressure Initial hypotension could be from dehydration from diarrhea Will check stool for C. difficile toxin and GI panel Hydrated with IV fluids Check orthostatics-patient is reporting dizziness when he stands up Patient is started on Norvasc for blood pressure control [facility] endocrinologist has recommended ACTH, cortisol, metanephrines and VIP-outpatient follow-up with endocrinology is recommended Check TSH  echocardiogram is pending    #Acute on chronic kidney disease stage III Renal function is better with IV fluids Creatinine 1.89-1.73 Avoid nephrotoxins   #Diabetes mellitus Sliding scale insulin. Continue Lantus his home medication  #Chronic dysphagia Patient had a barium study done during  the last admission. Outpatient follow-up with GI is recommended.  # MGUS per previous records Oncology consult placed   All the records are reviewed and case discussed with Care Management/Social Workerr. Management plans discussed with the patient, family and they are in agreement.  CODE STATUS: FC  TOTAL TIME TAKING CARE OF THIS PATIENT: 36 minutes.   POSSIBLE D/C IN 1-2 DAYS, DEPENDING ON CLINICAL CONDITION.  Note: This dictation was prepared with Dragon dictation along with smaller phrase technology. Any transcriptional errors that result from this process are unintentional.   Nicholes Mango M.D on 06/23/2017 at 11:22 AM  Between 7am to 6pm - Pager - [phone] After 6pm go to [email or web address] - password [email or web address] [place] [facility]  Office  [phone]  CC: Primary care physician; [name], MD

## 2017-06-23 NOTE — Progress Notes (Signed)
Referred the case to Dr. Estanislado Pandy for elevated Blood pressure, no new orders made, will continue to monitor patient. Patient is due for some blood tests this morning. Patient has no complaints thus far.

## 2017-06-23 NOTE — Progress Notes (Addendum)
Patient's Blood pressure noted to be elevated. Patient made no complaints, remained asymptomatic for high BP. Paged MD on call to let her know of patient's recent BP. No new orders has been made. Of note, BP medicines has been on hold due to patient came positive for orthostatic hypotension. Will continue to monitor.

## 2017-06-23 NOTE — Consult Note (Signed)
[place]  [phone]) [phone] [phone]) [phone]  ID: Cameron Adams OB: 1951/01/25  MR#: [id number]  IWO#:[id number]  Patient Care Team: [name], MD as PCP - General (Family Medicine)  CHIEF COMPLAINT: MGUS  INTERVAL HISTORY: Patient is a 66 year old male who was recently admitted with hypotension and diarrhea.  He was noted to have a history of MGUS initially evaluated at [facility]. He currently feels improved since admission, but not quite back to his baseline.  He has no neurologic complaints. He denies any fevers.  He has no bony pain. He denies any chest pain or shortness of breath.  He continues to have diarrhea, but denies any nausea or vomiting.  He has no urinary complaints. He offers not further specific complaints.  REVIEW OF SYSTEMS:   Review of Systems  Constitutional: Positive for malaise/fatigue. Negative for fever and weight loss.  Respiratory: Negative.  Negative for cough and shortness of breath.   Cardiovascular: Negative.  Negative for chest pain and leg swelling.  Gastrointestinal: Positive for diarrhea. Negative for abdominal pain, blood in stool, melena, nausea and vomiting.  Genitourinary: Negative.   Musculoskeletal: Negative.   Skin: Negative.  Negative for rash.  Neurological: Positive for weakness.  Psychiatric/Behavioral: Negative.  The patient is not nervous/anxious.     As per HPI. Otherwise, a complete review of systems is negative.  PAST MEDICAL HISTORY: Past Medical History:  Diagnosis Date  . Blindness of left eye   . Chronic kidney disease, stage III (moderate)   . Diabetes ([place])   . Hypertension   . Syncope and collapse   . Thyroid dysfunction     PAST SURGICAL HISTORY: Past Surgical History:  Procedure Laterality Date  . 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 PERIPHERAL VASCULAR  CATHETERIZATION N/A 09/15/2016   Procedure: Lower Extremity Angiography;  Surgeon: Algernon Huxley, MD;  Location: [facility];  Service: Cardiovascular;  Laterality: N/A;  . THYROID SURGERY      FAMILY HISTORY: Family History  Problem Relation Age of Onset  . Kidney failure Mother   . Heart disease Father   . Kidney failure Father   . Heart disease Brother     ADVANCED DIRECTIVES (Y/N):  @ADVDIR @  HEALTH MAINTENANCE: Social History  Substance Use Topics  . Smoking status: Former Smoker    Packs/day: 0.10    Years: 25.00    Types: Cigarettes    Quit date: 10/31/1978  . Smokeless tobacco: Never Used     Comment: smoked 2-3 cigarettes/day  . Alcohol use No     Colonoscopy:  PAP:  Bone density:  Lipid panel:  No Known Allergies  Current Facility-Administered Medications  Medication Dose Route Frequency Provider Last Rate Last Dose  . acetaminophen (TYLENOL) tablet 650 mg  650 mg Oral Q6H PRN Gouru, Aruna, MD      . amLODipine (NORVASC) tablet 5 mg  5 mg Oral Daily Gouru, Aruna, MD      . aspirin EC tablet 81 mg  81 mg Oral Daily Vaughan Basta, MD   81 mg at 06/23/17 0842  . atorvastatin (LIPITOR) tablet 40 mg  40 mg Oral QHS Vaughan Basta, MD   40 mg at 06/22/17 2214  . citalopram (CELEXA) tablet 20 mg  20 mg Oral Daily Vaughan Basta, MD   20 mg at 06/23/17 0842  . feeding supplement (ENSURE ENLIVE) (ENSURE ENLIVE)  liquid 237 mL  237 mL Oral TID BM Vaughan Basta, MD   237 mL at 06/23/17 0847  . gabapentin (NEURONTIN) capsule 100 mg  100 mg Oral TID Vaughan Basta, MD   100 mg at 06/23/17 1717  . heparin injection 5,000 Units  5,000 Units Subcutaneous Q8H Vaughan Basta, MD   5,000 Units at 06/23/17 1305  . HYDROcodone-acetaminophen (NORCO/VICODIN) 5-325 MG per tablet 1 tablet  1 tablet Oral Q6H PRN Vaughan Basta, MD      . hydrOXYzine (ATARAX/VISTARIL) tablet 10 mg  10 mg Oral Q8H PRN Vaughan Basta,  MD      . insulin aspart (novoLOG) injection 0-5 Units  0-5 Units Subcutaneous QHS Gouru, Aruna, MD      . insulin aspart (novoLOG) injection 0-9 Units  0-9 Units Subcutaneous TID WC Nicholes Mango, MD   3 Units at 06/23/17 1306  . insulin aspart (novoLOG) injection 5 Units  5 Units Subcutaneous TID AC Vaughan Basta, MD   5 Units at 06/23/17 1717  . insulin glargine (LANTUS) injection 10 Units  10 Units Subcutaneous QHS Vaughan Basta, MD   10 Units at 06/22/17 2214  . levothyroxine (SYNTHROID, LEVOTHROID) tablet 200 mcg  200 mcg Oral QAC breakfast Vaughan Basta, MD   200 mcg at 06/23/17 0842  . lipase/protease/amylase (CREON) capsule 24,000 Units  24,000 Units Oral TID WC Vaughan Basta, MD   24,000 Units at 06/23/17 1717  . polyvinyl alcohol (LIQUIFILM TEARS) 1.4 % ophthalmic solution 1 drop  1 drop Both Eyes TID PRN Vaughan Basta, MD        OBJECTIVE: Vitals:   06/23/17 1443 06/23/17 1923  BP: (!) 91/39 (!) 110/48  Pulse: 78 64  Resp:  15  Temp:    SpO2: 100% 100%     Body mass index is 23.71 kg/m.    ECOG FS:0 - Asymptomatic  General: Well-developed, well-nourished, no acute distress. Eyes: Pink conjunctiva, anicteric sclera. HEENT: Normocephalic, moist mucous membranes, clear oropharnyx. Lungs: Clear to auscultation bilaterally. Heart: Regular rate and rhythm. No rubs, murmurs, or gallops. Abdomen: Soft, nontender, nondistended. No organomegaly noted, normoactive bowel sounds. Musculoskeletal: No edema, cyanosis, or clubbing. Neuro: Alert, answering all questions appropriately. Cranial nerves grossly intact. Skin: No rashes or petechiae noted. Psych: Normal affect. Lymphatics: No cervical, calvicular, axillary or inguinal LAD.   LAB RESULTS:  Lab Results  Component Value Date   NA 136 06/23/2017   K 4.8 06/23/2017   CL 106 06/23/2017   CO2 25 06/23/2017   GLUCOSE 239 (H) 06/23/2017   BUN 22 (H) 06/23/2017   CREATININE 1.73 (H)  06/23/2017   CALCIUM 8.4 (L) 06/23/2017   PROT 6.5 06/22/2017   ALBUMIN 3.1 (L) 06/22/2017   AST 27 06/22/2017   ALT 23 06/22/2017   ALKPHOS 118 06/22/2017   BILITOT 0.5 06/22/2017   GFRNONAA 39 (L) 06/23/2017   GFRAA 45 (L) 06/23/2017    Lab Results  Component Value Date   WBC 4.6 06/23/2017   NEUTROABS 2.7 06/22/2017   HGB 10.7 (L) 06/23/2017   HCT 31.9 (L) 06/23/2017   MCV 81.7 06/23/2017   PLT 143 (L) 06/23/2017     STUDIES: No results found.  ASSESSMENT: MGUS  PLAN:    1. MGUS: Patient was initially evaluated at [facility] and was found to have an M-spike of 0.6 with lambda specificity.  He had a negative metastatic bone survey on September 24, 2015.  A bone marrow biopsy was discussed, but never completed. He was supposed to  follow up every 6 months, but appears to have missed some appointments secondary to multiple hospital admissions. He has renal insufficiency which can be attributed to his underlying diabetes. He has a mild anemia which is likely secondary to his CRI.  He has no other evidence of end-organ damage. No intervention is needed at this time. He does not require a bone marrow biopsy. Will order a MGUS panel with his AM labs for completeness.  Please ensure patient has follow up with his primary hematologist at [facility] upon discharge. If he wishes to transfer care to [facility], please have him follow up in the [place] 4-6 weeks after discharge. 2. Anemia: Likely secondary to his underlying CRI.  Will order iron stores for completeness. He does not require Procrit at this time.  Appreciate consult. Call with questions.   Lloyd Huger, MD   06/23/2017 8:19 PM

## 2017-06-23 NOTE — Care Management Note (Addendum)
Case Management Note  Patient Details  Name: Cameron Adams MRN: [id number] Date of Birth: July 19, 1951  Subjective/Objective:    Admitted to [facility] under observation status with the diagnosis of dizziness. States that he was in the process of seeing GI person and the  Nurse took my BP = 86/40. Presented to emergency room after that episode. Lives alone. Daughter is [name] [phone]). Last seen Dr. Netty Starring 06/16/17. Prescriptions are filled at [facility] in [place] under the [facility] program.   Discharged from this facility 06/19/17. [facility] home health was arranged. States that the nurse came x 1, but I didn't see her. "My daughter made that appointment and didn't tell me, so I wasn't at home."  [facility] facility x 2. No home oxygen. Rolling walker and inhaler in the home. Takes care of all basic and instrumental activities of daily living himself. "Only drives to grocery store and appointment." Fell x 2. Last fall broke ribs. Good appetite.              Action/Plan: May need resumption of Home Health orders per [facility].   Expected Discharge Date:                  Expected Discharge Plan:     In-House Referral:     Discharge planning Services     Post Acute Care Choice:    Choice offered to:     DME Arranged:    DME Agency:     HH Arranged:    HH Agency:     Status of Service:     If discussed at [employer], dates discussed:    Additional Comments:  Shelbie Ammons, RN MSN, CCM Care Management [phone] 06/23/2017, 8:43 AM

## 2017-06-23 NOTE — Care Management Obs Status (Signed)
[place] NOTIFICATION   Patient Details  Name: DEKKER VERGA MRN: [id number] Date of Birth: January 04, 1951   Medicare Observation Status Notification Given:  Yes    Shelbie Ammons, RN 06/23/2017, 8:53 AM

## 2017-06-24 LAB — GLUCOSE, CAPILLARY
GLUCOSE-CAPILLARY: 266 mg/dL — AB (ref 65–99)
Glucose-Capillary: 165 mg/dL — ABNORMAL HIGH (ref 65–99)
Glucose-Capillary: 101 mg/dL — ABNORMAL HIGH (ref 65–99)
Glucose-Capillary: 198 mg/dL — ABNORMAL HIGH (ref 65–99)

## 2017-06-24 LAB — METANEPHRINES, PLASMA
METANEPHRINE FREE: 23 pg/mL (ref 0–62)
Normetanephrine, Free: 21 pg/mL (ref 0–145)

## 2017-06-24 LAB — IRON AND TIBC
Iron: 57 ug/dL (ref 45–182)
Saturation Ratios: 17 % — ABNORMAL LOW (ref 17.9–39.5)
TIBC: 337 ug/dL (ref 250–450)
UIBC: 280 ug/dL

## 2017-06-24 LAB — BASIC METABOLIC PANEL
Anion gap: 6 (ref 5–15)
BUN: 24 mg/dL — AB (ref 6–20)
CALCIUM: 8.9 mg/dL (ref 8.9–10.3)
CO2: 25 mmol/L (ref 22–32)
Chloride: 103 mmol/L (ref 101–111)
Creatinine, Ser: 1.62 mg/dL — ABNORMAL HIGH (ref 0.61–1.24)
GFR calc Af Amer: 49 mL/min — ABNORMAL LOW (ref >60)
GFR calc non Af Amer: 42 mL/min — ABNORMAL LOW (ref >60)
Glucose, Bld: 299 mg/dL — ABNORMAL HIGH (ref 65–99)
Potassium: 5.5 mmol/L — ABNORMAL HIGH (ref 3.5–5.1)
SODIUM: 134 mmol/L — AB (ref 135–145)

## 2017-06-24 LAB — T4, FREE: Free T4: 0.82 ng/dL (ref 0.61–1.12)

## 2017-06-24 LAB — ACTH STIMULATION, 3 TIME POINTS
CORTISOL 30 MIN: 17.1 ug/dL
CORTISOL 60 MIN: 23.2 ug/dL
CORTISOL BASE: 6.8 ug/dL

## 2017-06-24 LAB — ECHOCARDIOGRAM COMPLETE
Height: 71 in
Weight: 2720 oz

## 2017-06-24 LAB — TSH: TSH: 12.221 u[IU]/mL — ABNORMAL HIGH (ref 0.350–4.500)

## 2017-06-24 LAB — FERRITIN: FERRITIN: 22 ng/mL — AB (ref 24–336)

## 2017-06-24 LAB — HEMOGLOBIN A1C
HEMOGLOBIN A1C: 10.9 % — AB (ref 4.8–5.6)
MEAN PLASMA GLUCOSE: 266.13 mg/dL

## 2017-06-24 LAB — POTASSIUM: Potassium: 5.4 mmol/L — ABNORMAL HIGH (ref 3.5–5.1)

## 2017-06-24 MED ORDER — SODIUM POLYSTYRENE SULFONATE 15 GM/60ML PO SUSP
30.0000 g | Freq: Once | ORAL | Status: AC
  Administered 2017-06-24: 30 g via ORAL
  Filled 2017-06-24: qty 120

## 2017-06-24 MED ORDER — SODIUM CHLORIDE 0.9 % IV SOLN
INTRAVENOUS | Status: AC
  Administered 2017-06-24: 14:00:00 via INTRAVENOUS

## 2017-06-24 MED ORDER — COSYNTROPIN 0.25 MG IJ SOLR
0.2500 mg | Freq: Once | INTRAMUSCULAR | Status: AC
Start: 2017-06-24 — End: 2017-06-24
  Administered 2017-06-24: 09:00:00 0.25 mg via INTRAVENOUS
  Filled 2017-06-24: qty 0.25

## 2017-06-24 MED ORDER — INSULIN ASPART 100 UNIT/ML ~~LOC~~ SOLN
3.0000 [IU] | Freq: Three times a day (TID) | SUBCUTANEOUS | Status: DC
  Administered 2017-06-24 – 2017-06-26 (×7): 3 [IU] via SUBCUTANEOUS
  Filled 2017-06-24 (×6): qty 1

## 2017-06-24 NOTE — Progress Notes (Signed)
Inpatient Diabetes Program Recommendations  AACE/ADA: New Consensus Statement on Inpatient Glycemic Control (2015)  Target Ranges:  Prepandial:   less than 140 mg/dL      Peak postprandial:   less than 180 mg/dL (1-2 hours)      Critically ill patients:  140 - 180 mg/dL   Lab Results  Component Value Date   GLUCAP 266 (H) 06/24/2017   HGBA1C 14.6 (H) 09/15/2016    Review of Glycemic Control Results for Cameron Adams, Cameron Adams (MRN [id number]) as of 06/24/2017 09:51  Ref. Range 06/23/2017 07:30 06/23/2017 12:20 06/23/2017 16:33 06/23/2017 18:19 06/23/2017 18:54 06/23/2017 22:05 06/24/2017 07:46  Glucose-Capillary Latest Ref Range: 65 - 99 mg/dL 229 (H) 226 (H) 81 37 (LL) 54 (L) 195 (H) 266 (H)   Diabetes history: DM2 Outpatient Diabetes medications: Lantus 10 units daily, Novolog 5 units TID with meals Current orders for Inpatient glycemic control: Lantus 10 units daily, Novolog 5 units TID with meals, Novolog sensitive tid with meals  Inpatient Diabetes Program Recommendations: Please consider reducing Novolog meal coverage to 3 units tid with meals (hold if patient eats less than 50%). Text page sent to MD.  Thanks,  Adah Perl, RN, BC-ADM Inpatient Diabetes Coordinator Pager [phone] (8a-5p)

## 2017-06-24 NOTE — Progress Notes (Signed)
Dr. Margaretmary Eddy notified patient's daughter, [name], would like to talk to her.

## 2017-06-24 NOTE — Progress Notes (Signed)
Patient refused bed alarm. Patient educated and verbalized understanding.  

## 2017-06-24 NOTE — Progress Notes (Signed)
[place] at [place] NAME: Cameron Adams    MR#:  [id number]  DATE OF BIRTH:  01-24-1951  SUBJECTIVE:  CHIEF COMPLAINT:  Patient is reporting dizziness when he stands up and diarrhea with loose watery bowel movements  REVIEW OF SYSTEMS:  CONSTITUTIONAL: No fever, fatigue or weakness.  EYES: No blurred or double vision.  EARS, NOSE, AND THROAT: No tinnitus or ear pain.  RESPIRATORY: No cough, shortness of breath, wheezing or hemoptysis.  CARDIOVASCULAR: No chest pain, orthopnea, edema.  GASTROINTESTINAL: No nausea, vomiting, Reporting diarrhea but  no abdominal pain GENITOURINARY: No dysuria, .hematuria.  ENDOCRINE: No polyuria, nocturia,  HEMATOLOGY: No anemia, easy bruising or bleeding SKIN: No rash or lesion. MUSCULOSKELETAL: No joint pain or arthritis.   NEUROLOGIC: No tingling, numbness, weakness.  PSYCHIATRY: No anxiety or depression.   DRUG ALLERGIES:  No Known Allergies  VITALS:  Blood pressure 126/71, pulse 75, temperature (!) 97.5 F (36.4 C), temperature source Oral, resp. rate 14, height 5\' 11"  (1.803 m), weight 77.1 kg (170 lb), SpO2 100 %.  PHYSICAL EXAMINATION:  GENERAL:  66 y.o.-year-old patient lying in the bed with no acute distress.  EYES: Pupils equal, round, reactive to light and accommodation. No scleral icterus. Extraocular muscles intact.  HEENT: Head atraumatic, normocephalic. Oropharynx and nasopharynx clear.  NECK:  Supple, no jugular venous distention. No thyroid enlargement, no tenderness.  LUNGS: Normal breath sounds bilaterally, no wheezing, rales,rhonchi or crepitation. No use of accessory muscles of respiration.  CARDIOVASCULAR: S1, S2 normal. No murmurs, rubs, or gallops.  ABDOMEN: Soft, nontender, nondistended. Bowel sounds present. No organomegaly or mass.  EXTREMITIES: No pedal edema, cyanosis, or clubbing.  NEUROLOGIC: Cranial nerves II through XII are intact. Muscle strength 5/5 in all  extremities. Sensation intact. Gait not checked.  PSYCHIATRIC: The patient is alert and oriented x 3.  SKIN: No obvious rash, lesion, or ulcer.    LABORATORY PANEL:   CBC  Recent Labs Lab 06/23/17 1028  WBC 4.6  HGB 10.7*  HCT 31.9*  PLT 143*   ------------------------------------------------------------------------------------------------------------------  Chemistries   Recent Labs Lab 06/22/17 1527  06/24/17 0846  NA 133*  < > 134*  K 4.8  < > 5.5*  CL 103  < > 103  CO2 25  < > 25  GLUCOSE 257*  < > 299*  BUN 19  < > 24*  CREATININE 1.89*  < > 1.62*  CALCIUM 8.5*  < > 8.9  AST 27  --   --   ALT 23  --   --   ALKPHOS 118  --   --   BILITOT 0.5  --   --   < > = values in this interval not displayed. ------------------------------------------------------------------------------------------------------------------  Cardiac Enzymes No results for input(s): TROPONINI in the last 168 hours. ------------------------------------------------------------------------------------------------------------------  RADIOLOGY:  No results found.  EKG:   Orders placed or performed during the hospital encounter of 06/22/17  . ED EKG  . ED EKG  . EKG 12-Lead  . EKG 12-Lead    ASSESSMENT AND PLAN:     #Hypotension currently patient is hypertensive with elevated blood pressure Initial hypotension could be from dehydration from diarrhea and orthostatic hypotension  stool neg for C. difficile toxin and GI panel Hydrated with IV fluids Check orthostatics-patient is reporting dizziness when he stands up Patient is started on Norvasc for blood pressure control [facility] endocrinologist has recommended ACTH, cortisol, metanephrines and VIP-Labs done results are pending outpatient follow-up  with endocrinology is recommended TSH- 12.2, free T4 is in the normal range, dose increased recently 2 days ago from 150 to 200  echocardiogram -left ventricular ejection fraction 55-60% with  no regional wall motion abnormalities   #Acute on chronic kidney disease stage III Renal function is better with IV fluids Creatinine 1.89-1.73 -1.62 Avoid nephrotoxins   #Diabetes mellitus-uncontrolled Sliding scale insulin. Continue Lantus  medication, titrate as needed Hemoglobin A1c 13.4 Outpatient follow-up with endocrinology as recommended  #Chronic dysphagia Patient had a barium study done during the last admission. Outpatient follow-up with GI is recommended.  # MGUS per previous records Op f/u with Oncology dr.Finnegan  PT eval   All the records are reviewed and case discussed with Care Management/Social Workerr. Management plans discussed with the patient, daughter [name] and they are in agreement.  CODE STATUS: FC  TOTAL TIME TAKING CARE OF THIS PATIENT: 36 minutes.   POSSIBLE D/C IN 1-2 DAYS, DEPENDING ON CLINICAL CONDITION.  Note: This dictation was prepared with Dragon dictation along with smaller phrase technology. Any transcriptional errors that result from this process are unintentional.   Nicholes Mango M.D on 06/24/2017 at 1:18 PM  Between 7am to 6pm - Pager - [phone] After 6pm go to [email or web address] - password [email or web address] [facility]  Office  [phone]  CC: Primary care physician; [name], MD

## 2017-06-25 DIAGNOSIS — D472 Monoclonal gammopathy: Secondary | ICD-10-CM | POA: Diagnosis present

## 2017-06-25 DIAGNOSIS — Z87891 Personal history of nicotine dependence: Secondary | ICD-10-CM | POA: Diagnosis not present

## 2017-06-25 DIAGNOSIS — E039 Hypothyroidism, unspecified: Secondary | ICD-10-CM | POA: Diagnosis present

## 2017-06-25 DIAGNOSIS — D638 Anemia in other chronic diseases classified elsewhere: Secondary | ICD-10-CM | POA: Diagnosis present

## 2017-06-25 DIAGNOSIS — I129 Hypertensive chronic kidney disease with stage 1 through stage 4 chronic kidney disease, or unspecified chronic kidney disease: Secondary | ICD-10-CM | POA: Diagnosis present

## 2017-06-25 DIAGNOSIS — H5462 Unqualified visual loss, left eye, normal vision right eye: Secondary | ICD-10-CM | POA: Diagnosis present

## 2017-06-25 DIAGNOSIS — N179 Acute kidney failure, unspecified: Secondary | ICD-10-CM | POA: Diagnosis present

## 2017-06-25 DIAGNOSIS — N189 Chronic kidney disease, unspecified: Secondary | ICD-10-CM | POA: Diagnosis not present

## 2017-06-25 DIAGNOSIS — E860 Dehydration: Secondary | ICD-10-CM | POA: Diagnosis present

## 2017-06-25 DIAGNOSIS — I951 Orthostatic hypotension: Secondary | ICD-10-CM | POA: Diagnosis present

## 2017-06-25 DIAGNOSIS — R42 Dizziness and giddiness: Secondary | ICD-10-CM | POA: Diagnosis not present

## 2017-06-25 DIAGNOSIS — Z841 Family history of disorders of kidney and ureter: Secondary | ICD-10-CM | POA: Diagnosis not present

## 2017-06-25 DIAGNOSIS — E1151 Type 2 diabetes mellitus with diabetic peripheral angiopathy without gangrene: Secondary | ICD-10-CM | POA: Diagnosis present

## 2017-06-25 DIAGNOSIS — Z794 Long term (current) use of insulin: Secondary | ICD-10-CM | POA: Diagnosis not present

## 2017-06-25 DIAGNOSIS — Z9119 Patient's noncompliance with other medical treatment and regimen: Secondary | ICD-10-CM | POA: Diagnosis not present

## 2017-06-25 DIAGNOSIS — K529 Noninfective gastroenteritis and colitis, unspecified: Secondary | ICD-10-CM | POA: Diagnosis present

## 2017-06-25 DIAGNOSIS — R197 Diarrhea, unspecified: Secondary | ICD-10-CM | POA: Diagnosis not present

## 2017-06-25 DIAGNOSIS — N183 Chronic kidney disease, stage 3 (moderate): Secondary | ICD-10-CM | POA: Diagnosis present

## 2017-06-25 DIAGNOSIS — I959 Hypotension, unspecified: Secondary | ICD-10-CM | POA: Diagnosis not present

## 2017-06-25 DIAGNOSIS — N289 Disorder of kidney and ureter, unspecified: Secondary | ICD-10-CM | POA: Diagnosis not present

## 2017-06-25 DIAGNOSIS — E1122 Type 2 diabetes mellitus with diabetic chronic kidney disease: Secondary | ICD-10-CM | POA: Diagnosis present

## 2017-06-25 DIAGNOSIS — E1143 Type 2 diabetes mellitus with diabetic autonomic (poly)neuropathy: Secondary | ICD-10-CM | POA: Diagnosis present

## 2017-06-25 DIAGNOSIS — E875 Hyperkalemia: Secondary | ICD-10-CM | POA: Diagnosis not present

## 2017-06-25 DIAGNOSIS — Z7982 Long term (current) use of aspirin: Secondary | ICD-10-CM | POA: Diagnosis not present

## 2017-06-25 DIAGNOSIS — E1165 Type 2 diabetes mellitus with hyperglycemia: Secondary | ICD-10-CM | POA: Diagnosis not present

## 2017-06-25 DIAGNOSIS — Z8249 Family history of ischemic heart disease and other diseases of the circulatory system: Secondary | ICD-10-CM | POA: Diagnosis not present

## 2017-06-25 LAB — KAPPA/LAMBDA LIGHT CHAINS
Kappa free light chain: 67.3 mg/L — ABNORMAL HIGH (ref 3.3–19.4)
Kappa, lambda light chain ratio: 2.06 — ABNORMAL HIGH (ref 0.26–1.65)
Lambda free light chains: 32.6 mg/L — ABNORMAL HIGH (ref 5.7–26.3)

## 2017-06-25 LAB — PROTEIN ELECTROPHORESIS, SERUM
A/G Ratio: 0.9 (ref 0.7–1.7)
ALPHA-1-GLOBULIN: 0.2 g/dL (ref 0.0–0.4)
ALPHA-2-GLOBULIN: 0.6 g/dL (ref 0.4–1.0)
Albumin ELP: 3.1 g/dL (ref 2.9–4.4)
BETA GLOBULIN: 1 g/dL (ref 0.7–1.3)
GAMMA GLOBULIN: 1.6 g/dL (ref 0.4–1.8)
Globulin, Total: 3.4 g/dL (ref 2.2–3.9)
Total Protein ELP: 6.5 g/dL (ref 6.0–8.5)

## 2017-06-25 LAB — BASIC METABOLIC PANEL
ANION GAP: 4 — AB (ref 5–15)
BUN: 28 mg/dL — ABNORMAL HIGH (ref 6–20)
CALCIUM: 8.4 mg/dL — AB (ref 8.9–10.3)
CHLORIDE: 107 mmol/L (ref 101–111)
CO2: 27 mmol/L (ref 22–32)
Creatinine, Ser: 1.87 mg/dL — ABNORMAL HIGH (ref 0.61–1.24)
GFR calc non Af Amer: 36 mL/min — ABNORMAL LOW (ref >60)
GFR, EST AFRICAN AMERICAN: 41 mL/min — AB (ref >60)
Glucose, Bld: 269 mg/dL — ABNORMAL HIGH (ref 65–99)
POTASSIUM: 5.2 mmol/L — AB (ref 3.5–5.1)
Sodium: 138 mmol/L (ref 135–145)

## 2017-06-25 LAB — IGG, IGA, IGM
IgA: 304 mg/dL (ref 61–437)
IgG (Immunoglobin G), Serum: 1503 mg/dL (ref 700–1600)
IgM (Immunoglobulin M), Srm: 206 mg/dL — ABNORMAL HIGH (ref 20–172)

## 2017-06-25 LAB — T3: T3, Total: 72 ng/dL (ref 71–180)

## 2017-06-25 LAB — GLUCOSE, CAPILLARY
GLUCOSE-CAPILLARY: 205 mg/dL — AB (ref 65–99)
Glucose-Capillary: 139 mg/dL — ABNORMAL HIGH (ref 65–99)
Glucose-Capillary: 90 mg/dL (ref 65–99)
Glucose-Capillary: 226 mg/dL — ABNORMAL HIGH (ref 65–99)
Glucose-Capillary: 136 mg/dL — ABNORMAL HIGH (ref 65–99)

## 2017-06-25 MED ORDER — SODIUM POLYSTYRENE SULFONATE 15 GM/60ML PO SUSP
30.0000 g | Freq: Once | ORAL | Status: AC
  Administered 2017-06-26: 05:00:00 30 g via ORAL
  Filled 2017-06-25: qty 120

## 2017-06-25 MED ORDER — LOPERAMIDE HCL 2 MG PO CAPS
2.0000 mg | ORAL_CAPSULE | Freq: Four times a day (QID) | ORAL | Status: DC | PRN
  Administered 2017-06-26 (×2): 2 mg via ORAL
  Filled 2017-06-25 (×2): qty 1

## 2017-06-25 NOTE — Progress Notes (Signed)
Pt. Educated on importance of insulin management, importance of meal coverage insulin, and sick day management of diabetes. Pt. Received handout on Insulin Management for Diabetes mellitus, as requested. Pt. Also educated on orthostatic hypotension, dizziness, and Acute Kidney Injury. Handouts were provided for each topic, and patient demonstrated understanding through teach-back. Signs and symptoms of hypotension and dizziness were explained, as well as instructions on safety and emergency contact.

## 2017-06-25 NOTE — Progress Notes (Signed)
Inpatient Diabetes Program Recommendations  AACE/ADA: New Consensus Statement on Inpatient Glycemic Control (2015)  Target Ranges:  Prepandial:   less than 140 mg/dL      Peak postprandial:   less than 180 mg/dL (1-2 hours)      Critically ill patients:  140 - 180 mg/dL   Lab Results  Component Value Date   GLUCAP 205 (H) 06/25/2017   HGBA1C 10.9 (H) 06/24/2017    Review of Glycemic Control  Results for Cameron Adams, Cameron Adams (MRN [id number]) as of 06/25/2017 10:11  Ref. Range 06/24/2017 07:46 06/24/2017 11:39 06/24/2017 16:43 06/24/2017 22:12 06/25/2017 07:44  Glucose-Capillary Latest Ref Range: 65 - 99 mg/dL 266 (H) 165 (H) 101 (H) 198 (H) 205 (H)   Diabetes history:DM2 Outpatient Diabetes medications: Lantus 10 units daily, Novolog 5 units TID with meals Current orders for Inpatient glycemic control: Lantus 10 units daily, Novolog 3 units TID with meals, Novolog sensitive tid with meals and Novolog 0-5 units qhs  Inpatient Diabetes Program Recommendations:Consider increasing to Lantus 12 units qhs (fasting blood sugars elevated).  Gentry Fitz, RN, BA, MHA, CDE Diabetes Coordinator Inpatient Diabetes Program  [phone] (Team Pager) [phone] ([place]) 06/25/2017 10:12 AM

## 2017-06-25 NOTE — Plan of Care (Signed)
Dr. Kandice Robinsons to ask if patient needs new IV.  Requested that a new IV be attempted d/t low BP readings.  However, can leave out if attempted and difficult stick. Patient informed.

## 2017-06-25 NOTE — Progress Notes (Signed)
Medications administered by student RN [id number] with supervision of Clinical Instructor Williamson Cavanah MSN, RN-BC or patient's assigned RN.   

## 2017-06-25 NOTE — Progress Notes (Signed)
[place] at [place] NAME: Cameron Adams    MR#:  [id number]  DATE OF BIRTH:  09-Jul-1951  SUBJECTIVE:  CHIEF COMPLAINT:  Patient is reporting dizziness when he stands up and diarrhea with loose watery bowel movements. Orthostatic vitals still drop significantly today.  REVIEW OF SYSTEMS:  CONSTITUTIONAL: No fever, fatigue or weakness.  EYES: No blurred or double vision.  EARS, NOSE, AND THROAT: No tinnitus or ear pain.  RESPIRATORY: No cough, shortness of breath, wheezing or hemoptysis.  CARDIOVASCULAR: No chest pain, orthopnea, edema.  GASTROINTESTINAL: No nausea, vomiting, Reporting diarrhea but  no abdominal pain GENITOURINARY: No dysuria, .hematuria.  ENDOCRINE: No polyuria, nocturia,  HEMATOLOGY: No anemia, easy bruising or bleeding SKIN: No rash or lesion. MUSCULOSKELETAL: No joint pain or arthritis.   NEUROLOGIC: No tingling, numbness, weakness.  PSYCHIATRY: No anxiety or depression.   DRUG ALLERGIES:  No Known Allergies  VITALS:  Blood pressure 123/79, pulse 75, temperature 97.7 F (36.5 C), temperature source Oral, resp. rate 15, height 5\' 11"  (1.803 m), weight 77.1 kg (170 lb), SpO2 100 %.  PHYSICAL EXAMINATION:  GENERAL:  66 y.o.-year-old patient lying in the bed with no acute distress.  EYES: Pupils equal, round, reactive to light and accommodation. No scleral icterus. Extraocular muscles intact.  HEENT: Head atraumatic, normocephalic. Oropharynx and nasopharynx clear.  NECK:  Supple, no jugular venous distention. No thyroid enlargement, no tenderness.  LUNGS: Normal breath sounds bilaterally, no wheezing, rales,rhonchi or crepitation. No use of accessory muscles of respiration.  CARDIOVASCULAR: S1, S2 normal. No murmurs, rubs, or gallops.  ABDOMEN: Soft, nontender, nondistended. Bowel sounds present. No organomegaly or mass.  EXTREMITIES: No pedal edema, cyanosis, or clubbing.  NEUROLOGIC: Cranial nerves II  through XII are intact. Muscle strength 5/5 in all extremities. Sensation intact. Gait not checked.  PSYCHIATRIC: The patient is alert and oriented x 3.  SKIN: No obvious rash, lesion, or ulcer.    LABORATORY PANEL:   CBC  Recent Labs Lab 06/23/17 1028  WBC 4.6  HGB 10.7*  HCT 31.9*  PLT 143*   ------------------------------------------------------------------------------------------------------------------  Chemistries   Recent Labs Lab 06/22/17 1527  06/25/17 0518  NA 133*  < > 138  K 4.8  < > 5.2*  CL 103  < > 107  CO2 25  < > 27  GLUCOSE 257*  < > 269*  BUN 19  < > 28*  CREATININE 1.89*  < > 1.87*  CALCIUM 8.5*  < > 8.4*  AST 27  --   --   ALT 23  --   --   ALKPHOS 118  --   --   BILITOT 0.5  --   --   < > = values in this interval not displayed. ------------------------------------------------------------------------------------------------------------------  Cardiac Enzymes No results for input(s): TROPONINI in the last 168 hours. ------------------------------------------------------------------------------------------------------------------  RADIOLOGY:  No results found.  EKG:   Orders placed or performed during the hospital encounter of 06/22/17  . ED EKG  . ED EKG  . EKG 12-Lead  . EKG 12-Lead    ASSESSMENT AND PLAN:     #Hypotension  Initial hypotension could be from dehydration from diarrhea and orthostatic hypotension  stool neg for C. difficile toxin and GI panel Hydrated with IV fluids Significant drop in orthostatics-patient is reporting dizziness when he stands up Patient was started on Norvasc for blood pressure control again on 2nd day this admission, and his BP is dropping now significantly. [facility] endocrinologist  has recommended ACTH, cortisol, metanephrines and VIP-Labs done results reviewed, outpatient follow-up with endocrinology is recommended TSH- 12.2, free T4 is in the normal range, dose increased recently 2 days ago from 150  to 200  echocardiogram -left ventricular ejection fraction 55-60% with no regional wall motion abnormalities I advised on slow rising technique and taking a break of 1 min on standing to allow BP to adjust.   #Acute on chronic kidney disease stage III Renal function is better with IV fluids Creatinine 1.89-1.73 -1.62 Avoid nephrotoxins   #Diabetes mellitus-uncontrolled Sliding scale insulin. Continue Lantus  medication, titrate as needed His blood sugar goes up to 90 on lower side, so will not increase lantus much. Hemoglobin A1c 13.4 Outpatient follow-up with endocrinology as recommended  #Chronic dysphagia Patient had a barium study done during the last admission. Outpatient follow-up with GI is recommended.  # MGUS per previous records Op f/u with Oncology dr.Finnegan  PT eval   All the records are reviewed and case discussed with Care Management/Social Workerr. Management plans discussed with the patient, daughter Cameron Adams and they are in agreement.  CODE STATUS: FC  TOTAL TIME TAKING CARE OF THIS PATIENT: 35 minutes.   POSSIBLE D/C IN 1-2 DAYS, DEPENDING ON CLINICAL CONDITION.  Note: This dictation was prepared with Dragon dictation along with smaller phrase technology. Any transcriptional errors that result from this process are unintentional.   Vaughan Basta M.D on 06/25/2017 at 9:37 PM  Between 7am to 6pm - Pager - [phone] After 6pm go to [email or web address] - password [email or web address] [place] [facility]  Office  [phone]  CC: Primary care physician; [name], MD

## 2017-06-26 ENCOUNTER — Encounter: Payer: Self-pay | Admitting: Nurse Practitioner

## 2017-06-26 DIAGNOSIS — E1165 Type 2 diabetes mellitus with hyperglycemia: Secondary | ICD-10-CM

## 2017-06-26 DIAGNOSIS — R42 Dizziness and giddiness: Secondary | ICD-10-CM

## 2017-06-26 DIAGNOSIS — N289 Disorder of kidney and ureter, unspecified: Secondary | ICD-10-CM

## 2017-06-26 DIAGNOSIS — N189 Chronic kidney disease, unspecified: Secondary | ICD-10-CM

## 2017-06-26 LAB — BASIC METABOLIC PANEL
ANION GAP: 6 (ref 5–15)
BUN: 31 mg/dL — ABNORMAL HIGH (ref 6–20)
CHLORIDE: 104 mmol/L (ref 101–111)
CO2: 27 mmol/L (ref 22–32)
CREATININE: 1.73 mg/dL — AB (ref 0.61–1.24)
Calcium: 8.7 mg/dL — ABNORMAL LOW (ref 8.9–10.3)
GFR calc non Af Amer: 39 mL/min — ABNORMAL LOW (ref >60)
GFR, EST AFRICAN AMERICAN: 45 mL/min — AB (ref >60)
Glucose, Bld: 348 mg/dL — ABNORMAL HIGH (ref 65–99)
Potassium: 5.4 mmol/L — ABNORMAL HIGH (ref 3.5–5.1)
SODIUM: 137 mmol/L (ref 135–145)

## 2017-06-26 LAB — GLUCOSE, CAPILLARY
GLUCOSE-CAPILLARY: 325 mg/dL — AB (ref 65–99)
GLUCOSE-CAPILLARY: 164 mg/dL — AB (ref 65–99)
GLUCOSE-CAPILLARY: 134 mg/dL — AB (ref 65–99)
Glucose-Capillary: 151 mg/dL — ABNORMAL HIGH (ref 65–99)

## 2017-06-26 LAB — POTASSIUM: POTASSIUM: 4.3 mmol/L (ref 3.5–5.1)

## 2017-06-26 LAB — LACTOFERRIN, FECAL, QUALITATIVE: LACTOFERRIN, FECAL, QUAL: NEGATIVE

## 2017-06-26 MED ORDER — PANTOPRAZOLE SODIUM 40 MG IV SOLR
40.0000 mg | INTRAVENOUS | Status: DC
  Administered 2017-06-26 – 2017-06-27 (×2): 40 mg via INTRAVENOUS
  Filled 2017-06-26 (×2): qty 40

## 2017-06-26 MED ORDER — PANCRELIPASE (LIP-PROT-AMYL) 12000-38000 UNITS PO CPEP
72000.0000 [IU] | ORAL_CAPSULE | Freq: Three times a day (TID) | ORAL | Status: DC
  Administered 2017-06-26 – 2017-06-29 (×10): 72000 [IU] via ORAL
  Filled 2017-06-26 (×10): qty 6

## 2017-06-26 MED ORDER — UNJURY CHICKEN SOUP POWDER
8.0000 [oz_av] | Freq: Two times a day (BID) | ORAL | Status: DC
  Administered 2017-06-28: 8 [oz_av] via ORAL

## 2017-06-26 MED ORDER — MIDODRINE HCL 5 MG PO TABS
5.0000 mg | ORAL_TABLET | Freq: Three times a day (TID) | ORAL | Status: DC
  Administered 2017-06-26 – 2017-06-29 (×9): 5 mg via ORAL
  Filled 2017-06-26 (×10): qty 1

## 2017-06-26 MED ORDER — PANCRELIPASE (LIP-PROT-AMYL) 12000-38000 UNITS PO CPEP
36000.0000 [IU] | ORAL_CAPSULE | Freq: Two times a day (BID) | ORAL | Status: DC
  Administered 2017-06-26 – 2017-06-29 (×4): 36000 [IU] via ORAL
  Filled 2017-06-26 (×4): qty 3

## 2017-06-26 NOTE — Progress Notes (Signed)
Initial Nutrition Assessment  DOCUMENTATION CODES:   Not applicable  INTERVENTION:  Discontinued Ensure Enlive as it contains high-fructose corn syrup, which is currently being excluded from diet. Will substitute with appropriate supplement.  Provide Unjury Chicken Soup flavor po BID, each packet provides 100 kcal and 21 grams of protein. Does not contain lactose or high-fructose corn syrup. Although patient is weight-stable, he has almost no intake of protein related to his difficulty chewing/swallowing, so oral nutrition supplement needed to help meet protein needs.  Reviewed "Diarrhea Nutrition Therapy" handout from the Academy of [employer] and Dietetics. Discussed the diet MD would like patient to follow. Discouraged intake of lactose, artificial sweeteners, and concentrated sweets. Discussed physiology of osmotic diarrhea and reasoning behind restriction. Discussed foods patient can eat on diet and encouraged adequate intake to prevent any weight loss while on restrictive diet. Reviewed specialized menu with patient (also provided copy to diet office).  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  GOAL:   Patient will meet greater than or equal to 90% of their needs  MONITOR:   PO intake, Supplement acceptance, Weight trends, Labs, I & O's  REASON FOR ASSESSMENT:   Consult Diet education (diet low in lactose, artificial sugars, concentrated sweets - concern for osmotic diarrhea)  ASSESSMENT:   66 year old male with PMHx of DM type 2, HTN, CKD stage III, orthostatic hypotension, chronic diarrhea, hypothyroidism, diastolic dysfunction who presented with dizziness and diarrhea.   Spoke with patient at bedside. He reports his appetite is good now. He endorses that he has previously  struggled with poor appetite and that is why he has been drinking Ensure lately. He occasionally drinks up to 3 per day. He used to go days where he would only have coffee to drink. He has struggled with chronic diarrhea. It occurs after he eats a meal, and is proportionate to meal. He does not get diarrhea overnight. On days where he does not really eat anything, does not have diarrhea. For breakfast he may have oatmeal or grits with Splenda mixed in. He has trouble eating meat due to difficulty chewing (noted very poor dentition on exam) and also some difficulty swallowing (noted in chart undergoing work-up outpatient with GI related to this).  Patient reports he is weight stable. He was last 178 lbs in 2015. Has been 170-173 lbs this past year.  Meal Completion: 100%  Medications reviewed and include: ASA 81 mg, Novolog 0-9 units TID, Novolog 0-5 units QHS, Novolog 3 units TID, Novolog 10 units QHS, levothyroxine, Creon 36000 units BID with snacks, Creon 72000 units TID with meals, pantoprazole, Imodium PRN.  Labs reviewed: CBG 90-226 past 24 hrs, Potassium 4.3 (improved after Kayexalate - was 5.4), BUN 31, Creatinine 1.73. HgbA1c 10.9 on 9/19.  Nutrition-Focused physical exam completed. Findings are no fat depletion, no muscle depletion, and no edema. Poor dentition. Patient reports he has had multiple extractions lately.  Patient does not meet criteria for malnutrition.  Discussed with RN.  Diet Order:  Diet heart healthy/carb modified Room service appropriate? Yes; Fluid consistency: Thin  Skin:  Wound (see comment) (amputation second right toe)  Last BM:  06/26/2017 - large type 7  Height:   Ht Readings from Last 1 Encounters:  06/22/17 5\' 11"  (1.803 m)    Weight:   Wt Readings from Last  1 Encounters:  06/22/17 170 lb (77.1 kg)    Ideal Body Weight:  78.2 kg  BMI:  Body mass index is 23.71 kg/m.  Estimated Nutritional Needs:   Kcal:  1890-2045 (MSJ x  1.2-1.3)  Protein:  95-110 grams (1.2-1.4 grams/kg)  Fluid:  2.3 L/day (30 ml/kg)  EDUCATION NEEDS:   Education needs addressed  Willey Blade, MS, RD, LDN Pager: [phone] After Hours Pager: [phone]

## 2017-06-26 NOTE — Progress Notes (Signed)
[facility] NAME: Cameron Adams    MR#:  [id number]  DATE OF BIRTH:  Jan 24, 1951  SUBJECTIVE:  CHIEF COMPLAINT:  Patient is reporting dizziness when he stands up and diarrhea with loose watery bowel movements. Orthostatic vitals still drop significantly today.   Have Big BM this am after kayexalate.  REVIEW OF SYSTEMS:  CONSTITUTIONAL: No fever,positive for dizziness, fatigue or weakness.  EYES: No blurred or double vision.  EARS, NOSE, AND THROAT: No tinnitus or ear pain.  RESPIRATORY: No cough, shortness of breath, wheezing or hemoptysis.  CARDIOVASCULAR: No chest pain, orthopnea, edema.  GASTROINTESTINAL: No nausea, vomiting, Reporting diarrhea but  no abdominal pain GENITOURINARY: No dysuria, .hematuria.  ENDOCRINE: No polyuria, nocturia,  HEMATOLOGY: No anemia, easy bruising or bleeding SKIN: No rash or lesion. MUSCULOSKELETAL: No joint pain or arthritis.   NEUROLOGIC: No tingling, numbness, weakness.  PSYCHIATRY: No anxiety or depression.   DRUG ALLERGIES:  No Known Allergies  VITALS:  Blood pressure 121/61, pulse 73, temperature 97.8 F (36.6 C), temperature source Oral, resp. rate 13, height 5\' 11"  (1.803 m), weight 77.1 kg (170 lb), SpO2 99 %.  PHYSICAL EXAMINATION:  GENERAL:  66 y.o.-year-old patient lying in the bed with no acute distress.  EYES: Pupils equal, round, reactive to light and accommodation. No scleral icterus. Extraocular muscles intact.  HEENT: Head atraumatic, normocephalic. Oropharynx and nasopharynx clear.  NECK:  Supple, no jugular venous distention. No thyroid enlargement, no tenderness.  LUNGS: Normal breath sounds bilaterally, no wheezing, rales,rhonchi or crepitation. No use of accessory muscles of respiration.  CARDIOVASCULAR: S1, S2 normal. No murmurs, rubs, or gallops.  ABDOMEN: Soft, nontender, nondistended. Bowel sounds present. No organomegaly or mass.  EXTREMITIES: No pedal  edema, cyanosis, or clubbing.  NEUROLOGIC: Cranial nerves II through XII are intact. Muscle strength 5/5 in all extremities. Sensation intact. Gait not checked.  PSYCHIATRIC: The patient is alert and oriented x 3.  SKIN: No obvious rash, lesion, or ulcer.    LABORATORY PANEL:   CBC  Recent Labs Lab 06/23/17 1028  WBC 4.6  HGB 10.7*  HCT 31.9*  PLT 143*   ------------------------------------------------------------------------------------------------------------------  Chemistries   Recent Labs Lab 06/22/17 1527  06/26/17 0326  NA 133*  < > 137  K 4.8  < > 5.4*  CL 103  < > 104  CO2 25  < > 27  GLUCOSE 257*  < > 348*  BUN 19  < > 31*  CREATININE 1.89*  < > 1.73*  CALCIUM 8.5*  < > 8.7*  AST 27  --   --   ALT 23  --   --   ALKPHOS 118  --   --   BILITOT 0.5  --   --   < > = values in this interval not displayed. ------------------------------------------------------------------------------------------------------------------  Cardiac Enzymes No results for input(s): TROPONINI in the last 168 hours. ------------------------------------------------------------------------------------------------------------------  RADIOLOGY:  No results found.  EKG:   Orders placed or performed during the hospital encounter of 06/22/17  . ED EKG  . ED EKG  . EKG 12-Lead  . EKG 12-Lead    ASSESSMENT AND PLAN:     #Hypotension  Initial hypotension could be from dehydration from diarrhea and orthostatic hypotension  stool neg for C. difficile toxin and GI panel Hydrated with IV fluids Significant drop in orthostatics-patient is reporting dizziness when he stands up Patient was started on Norvasc for blood pressure control again on 2nd day  this admission, and his BP is dropping now significantly. [facility] endocrinologist has recommended ACTH, cortisol, metanephrines and VIP-Labs done results reviewed, outpatient follow-up with endocrinology is recommended TSH- 12.2, free T4 is in  the normal range, dose increased recently 2 days ago from 150 to 200  echocardiogram -left ventricular ejection fraction 55-60% with no regional wall motion abnormalities  I advised on slow rising technique and taking a break of 1 min on standing to allow BP to adjust.  Called cardiology consult to help.  #Acute on chronic kidney disease stage III Renal function is better with IV fluids Creatinine 1.89-1.73 -1.62 Avoid nephrotoxins  #Diabetes mellitus-uncontrolled Sliding scale insulin. Continue Lantus  medication, titrate as needed His blood sugar goes up to 90 on lower side, so will not increase lantus much. Hemoglobin A1c 13.4 Outpatient follow-up with endocrinology as recommended  # Chronic diarrhea   Checked for C diff and GI panel, started on imodium.    Still cont to have diarrhea    Called GI consult.  #Chronic dysphagia Patient had a barium study done during the last admission. Outpatient follow-up with GI is recommended.  # MGUS per previous records Op f/u with Oncology dr.Finnegan  PT eval   All the records are reviewed and case discussed with Care Management/Social Workerr. Management plans discussed with the patient, daughter [name] and they are in agreement.  CODE STATUS: FC  TOTAL TIME TAKING CARE OF THIS PATIENT: 35 minutes.  Called GI and cardio consult today due to his persistant complains.  POSSIBLE D/C IN 1-2 DAYS, DEPENDING ON CLINICAL CONDITION.  Note: This dictation was prepared with Dragon dictation along with smaller phrase technology. Any transcriptional errors that result from this process are unintentional.   Vaughan Basta M.D on 06/26/2017 at 1:06 PM  Between 7am to 6pm - Pager - [phone] After 6pm go to [email or web address] - password [email or web address] [facility]  Office  [phone]  CC: Primary care physician; [name], MD

## 2017-06-26 NOTE — Plan of Care (Signed)
Problem: Education: Goal: Knowledge of  General Education information/materials will improve Outcome: Progressing Orthostatic, VSS otherwise.  Denies pain, nausea.  Refuses bed alarm, A/O x4, free of falls.  Hyperkalemic, received ordered Kayexalate in AM after lab draw, pt stated Ensure causes BM's for him, and he is having diarrhea.  Chair locked, call bell within reach.  WCTM.

## 2017-06-26 NOTE — Progress Notes (Signed)
Inpatient Diabetes Program Recommendations  AACE/ADA: New Consensus Statement on Inpatient Glycemic Control (2015)  Target Ranges:  Prepandial:   less than 140 mg/dL      Peak postprandial:   less than 180 mg/dL (1-2 hours)      Critically ill patients:  140 - 180 mg/dL   Lab Results  Component Value Date   GLUCAP 136 (H) 06/25/2017   HGBA1C 10.9 (H) 06/24/2017    Review of Glycemic Control  Results for Cameron Adams, Cameron Adams (MRN [id number]) as of 06/26/2017 09:24  Ref. Range 06/24/2017 22:12 06/25/2017 07:44 06/25/2017 12:20 06/25/2017 17:59 06/25/2017 20:24  Glucose-Capillary Latest Ref Range: 65 - 99 mg/dL 198 (H) 205 (H) 90 226 (H) 136 (H)   Fasting blood sugar 325mg /dl    Diabetes history:DM2 Outpatient Diabetes medications: Lantus 10 units daily, Novolog 5 units TID with meals Current orders for Inpatient glycemic control: Lantus 10 units daily, Novolog 3 units TID with meals, Novolog sensitive tid with meals and Novolog 0-5 units qhs  Inpatient Diabetes Program Recommendations:Consider increasing to Lantus 15 units qhs (fasting blood sugars elevated).  Gentry Fitz, RN, BA, MHA, CDE Diabetes Coordinator Inpatient Diabetes Program  [phone] (Team Pager) [phone] ([place]) 06/26/2017 7:38 AM

## 2017-06-26 NOTE — Consult Note (Addendum)
Jonathon Bellows MD, MRCP(U.K) [address]  [place]  [place], [place] [address]  Main: [phone]  Fax: [phone]  Consultation  Referring Provider:  Dr Anselm Jungling  Primary Care Physician:  [name], MD Primary Gastroenterologist: [facility]      Reason for Consultation:     Diarrhea   Date of Admission :  06/22/2017 Date of Consultation:  06/26/2017         HPI:   Cameron Adams is a 66 y.o. male with a history of hypothyroidism , poorly controlled diabetes, previously been evaluated by [facility] for chronic diarrhea . Some mention of prior dysphagia, odynophagia as well. 05/23/2010 Colonoscopy,Preparation of the colon was fair. The examined portion of the ileum was normal. The entire examined colon is normal. Repeat colonoscopy in 10 years for surveillance.They have tried a trial of CREON for the diarrhea and were pursuing esophageal manometry for dysphagia . Last seen at [facility] on 06/22/17-sent to the ED due to hypotension. On admission he was in AKI. Barium swallow on 05/19/17 showed decreased motolity .Evaluation for diarrhea so far: 1. C diff negative , GI PCR negative. ESR elevated 09/2016, TSH elevated 2 days back   He say that he has had diarrhea for 2 years, 2 bowel movements a day , very watery, occurs after meals, larger the meal more the diarrhea. Denies any weight loss. No blood in the stool, no recent colonoscopy. He does consume ensure and artificial sugars in his diet.     Past Medical History:  Diagnosis Date  . Blindness of left eye   . Chronic kidney disease, stage III (moderate)   . Diabetes ([place])   . Hypertension   . Syncope and collapse   . Thyroid dysfunction     Past Surgical History:  Procedure Laterality Date  . 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 PERIPHERAL VASCULAR CATHETERIZATION N/A 09/15/2016   Procedure:  Lower Extremity Angiography;  Surgeon: Algernon Huxley, MD;  Location: [facility];  Service: Cardiovascular;  Laterality: N/A;  . THYROID SURGERY      Prior to Admission medications   Medication Sig Start Date End Date Taking? Authorizing Provider  aspirin EC 81 MG tablet Take 81 mg by mouth daily.   Yes [provider]  atorvastatin (LIPITOR) 40 MG tablet Take 40 mg by mouth at bedtime.   Yes [provider]  citalopram (CELEXA) 20 MG tablet Take 20 mg by mouth daily.   Yes [provider]  gabapentin (NEURONTIN) 100 MG capsule TAKE 1-3 CAPSULES BY MOUTH BEFORE BEDTIME FOR ARM PAIN 01/14/17  Yes [provider]  HYDROcodone-acetaminophen (NORCO) 5-325 MG tablet Take 1 tablet by mouth every 6 (six) hours as needed for moderate pain. 05/20/17  Yes Vaughan Basta, MD  hydroxypropyl methylcellulose / hypromellose (ISOPTO TEARS / GONIOVISC) 2.5 % ophthalmic solution Place 1 drop into both eyes 3 (three) times daily as needed for dry eyes.   Yes [provider]  hydrOXYzine (ATARAX/VISTARIL) 10 MG tablet Take 10 mg by mouth every 8 (eight) hours as needed for itching.    Yes [provider]  insulin aspart (NOVOLOG) 100 UNIT/ML injection Inject 5 Units into the skin 3 (three) times daily before meals.   Yes [provider]  insulin glargine (LANTUS) 100 UNIT/ML injection Inject 0.1 mLs (10 Units total) into the skin  at bedtime. 06/19/17  Yes Vaughan Basta, MD  levothyroxine (SYNTHROID, LEVOTHROID) 200 MCG tablet Take 1 tablet (200 mcg total) by mouth daily before breakfast. 05/21/17  Yes Vaughan Basta, MD  lipase/protease/amylase (CREON) 12000 units CPEP capsule Take 24,000 Units by mouth 3 (three) times daily with meals.    Yes [provider]  feeding supplement, ENSURE ENLIVE, (ENSURE ENLIVE) LIQD Take 237 mLs by mouth 3 (three) times daily between meals. Patient not taking: Reported on 06/22/2017  05/20/17   Vaughan Basta, MD  pantoprazole sodium (PROTONIX) 40 mg/20 mL PACK Take 20 mLs (40 mg total) by mouth 2 (two) times daily before a meal. Patient not taking: Reported on 06/16/2017 05/20/17   Vaughan Basta, MD    Family History  Problem Relation Age of Onset  . Kidney failure Mother   . Heart disease Father   . Kidney failure Father   . Heart disease Brother      Social History  Substance Use Topics  . Smoking status: Former Smoker    Packs/day: 0.10    Years: 25.00    Types: Cigarettes    Quit date: 10/31/1978  . Smokeless tobacco: Never Used     Comment: smoked 2-3 cigarettes/day  . Alcohol use No    Allergies as of 06/22/2017  . (No Known Allergies)    Review of Systems:    All systems reviewed and negative except where noted in HPI.   Physical Exam:  Vital signs in last 24 hours: Temp:  [97.7 F (36.5 C)-98.5 F (36.9 C)] 97.8 F (36.6 C) (09/21 0441) Pulse Rate:  [70-75] 73 (09/21 0441) Resp:  [13-15] 13 (09/21 0441) BP: (78-170)/(41-79) 121/61 (09/21 0441) SpO2:  [99 %-100 %] 99 % (09/21 0441) Last BM Date: 06/25/17 General:   Pleasant, cooperative in NAD Head:  Normocephalic and atraumatic. Eyes:   No icterus.   Conjunctiva pink. PERRLA. Ears:  Normal auditory acuity. Neck:  Supple; no masses or thyroidomegaly Lungs: Respirations even and unlabored. Lungs clear to auscultation bilaterally.   No wheezes, crackles, or rhonchi.  Heart:  Regular rate and rhythm;  Without murmur, clicks, rubs or gallops Abdomen:  Soft, nondistended, nontender. Normal bowel sounds. No appreciable masses or hepatomegaly.  No rebound or guarding.  Rectal:  Not performed. Neurologic:  Alert and oriented x3;  grossly normal neurologically. Skin:  Intact without significant lesions or rashes. Cervical Nodes:  No significant cervical adenopathy. Psych:  Alert and cooperative. Normal affect.  LAB RESULTS: No results for input(s): WBC, HGB, HCT, PLT in the  last 72 hours. BMET  Recent Labs  06/24/17 0846 06/24/17 1704 06/25/17 0518 06/26/17 0326  NA 134*  --  138 137  K 5.5* 5.4* 5.2* 5.4*  CL 103  --  107 104  CO2 25  --  27 27  GLUCOSE 299*  --  269* 348*  BUN 24*  --  28* 31*  CREATININE 1.62*  --  1.87* 1.73*  CALCIUM 8.9  --  8.4* 8.7*   LFT No results for input(s): PROT, ALBUMIN, AST, ALT, ALKPHOS, BILITOT, BILIDIR, IBILI in the last 72 hours. PT/INR No results for input(s): LABPROT, INR in the last 72 hours.  STUDIES: No results found.    Impression / Plan:   ZEV BLUE is a 66 y.o. y/o male with prior evaluation at [facility] and [facility] for chronic diarrhea. Apart from a stool fat , am unable to find any other specific evaluation tests for diarrhea that has been chronic.  Last colonoscopy was back in 2011 . Severe diarrhea with hypotension requiring hospital admission . Very likely has an osmotic diarrhea from sugars in diet as he says the diarrhea is after meals and proportional to the intake of food. It could also be from autonomic dysfunction from long standing diabetes or from SIBO  Plan  1. Celiac serology , Fecal calpropectin to r/o inflammation CRP  2. Check stool osmolality , stool sodium, potassium , chloride to calculate osmolar gap , stool Ph  3. Dietician consult - Commence on a diet with no artificial sugars, dairy products- presently is on ensure which has high fructose corn syrup which can cause an osmotic diarrhea  4. Based on these results which we should have by tomorrow will likely need a colonoscopy to take bx from the Terminal ileum , random colon biopsies to r/o microscopic colitis.  5. Strict stool chart    7. The present dose of CREON is insufficient - will need to increase to 72,000 units lipase prior to each meal and 36,000 units prior to each snack .  8. PPI 9. After stool tests continue on Imodium.    Thank you for involving me in the care of this patient.      LOS: 1 day   Jonathon Bellows, MD   06/26/2017, 10:52 AM

## 2017-06-26 NOTE — Consult Note (Signed)
Cardiology Consult    Patient ID: Cameron Adams MRN: [id number], DOB/AGE: 05/08/1951   Admit date: 06/22/2017 Date of Consult: 06/26/2017  Primary Physician: [name], MD Primary Cardiologist: formerly Joaquim Nam, MD (11/2013 - [place]) Requesting Provider: Doylene Canning, MD  Patient Profile    Cameron Adams is a 66 y.o. male with a history of CKD III, labile HTN w/ orthostatic hypotension, DM, hypothyroidism, and anemia, who is being seen today for the evaluation of orthostatic hypotension at the request of Dr. Anselm Jungling.  Past Medical History   Past Medical History:  Diagnosis Date  . Anemia of chronic disease   . Blindness of left eye   . Chronic kidney disease, stage III (moderate)   . Diabetes ([place])   . Diarrhea   . Diastolic dysfunction    a. 06/2017 Echo: EF 55-60%, no rwma, Gr2 DD, Asc Ao 3.9cm, nl RV fxn, nl PASP.  Marland Kitchen Hypertension   . Hypothyroidism   . Monoclonal gammopathy of unknown significance (MGUS)    a. prev seen @ [facility].  Lost to f/u - never had BM bx.  . Orthostatic hypotension   . Syncope and collapse     Past Surgical History:  Procedure Laterality Date  . 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 PERIPHERAL VASCULAR CATHETERIZATION N/A 09/15/2016   Procedure: Lower Extremity Angiography;  Surgeon: Algernon Huxley, MD;  Location: [facility];  Service: Cardiovascular;  Laterality: N/A;  . THYROID SURGERY       Allergies  No Known Allergies  History of Present Illness    66 year old male with the above past medical history including diabetes, labile hypertension, stage III chronic kidney disease and anemia. He has no prior cardiac history. He has had several admissions over the past month, starting in August of this year, when he was walking down some stairs, lost his balance in the setting of impaired vision on the left,  and fell, striking his rib cage resulting in pain and right-sided rib fractures. During that admission, he was noted to have a markedly elevated hemoglobin A1c at 13.6. TSH was high and his levothyroxine dose was adjusted.he also complained of dysphagia with barium swallow showing dysmotility of the esophagus. As was a chronic complaint and he was seen by GI with recommendation for outpatient follow-up. He was subsequently discharged but then readmitted just shy of a month later, when on September 11, he presented with a several day history of diarrhea, hypotension, and dizziness. In the emergency department, he was found to have acute on chronic renal failure with hyperkalemia.  Patient's blood pressure was labile during admission. Initially, he required IV fluids. At that time, he was supposed to be on amlodipine at home, but he was reportedly not taking. It was not resumed at discharge in the setting of low blood pressures. During admission, he was also noted to have hypoglycemia and adjustments were made to his Lantus dose.  Following resolution of diarrhea, he was discharged on September 14.  Following discharge, he says that he continued to note orthostatic lightheadedness without syncope. He followed up with gastroenterology on September 17 and was noted to be hypotensive and also complained of dizziness. In that setting, he was referred back to the emergency department where he was actually hypertensive at 188/87.  He was readmitted. Blood pressures have been labile since admission with intermittent hypotension, hypertension,  and ongoing symptomatic orthostatic hypotension. There has been concern for autonomic dysfunction and he has undergone extensive laboratory evaluation with normal cortisol and catecholamines.  Echocardiogram has shown normal LV function with grade 2 diastolic dysfunction. No significant valvular abnormalities noted. Patient denies any prior history of chest pain or dyspnea. He does  continue to experience orthostatic symptoms.  Inpatient Medications    . aspirin EC  81 mg Oral Daily  . atorvastatin  40 mg Oral QHS  . citalopram  20 mg Oral Daily  . feeding supplement (ENSURE ENLIVE)  237 mL Oral TID BM  . gabapentin  100 mg Oral TID  . heparin  5,000 Units Subcutaneous Q8H  . insulin aspart  0-5 Units Subcutaneous QHS  . insulin aspart  0-9 Units Subcutaneous TID WC  . insulin aspart  3 Units Subcutaneous TID AC  . insulin glargine  10 Units Subcutaneous QHS  . levothyroxine  200 mcg Oral QAC breakfast  . lipase/protease/amylase  36,000 Units Oral BID BM  . lipase/protease/amylase  72,000 Units Oral TID WC  . pantoprazole (PROTONIX) IV  40 mg Intravenous Q24H    Family History    Family History  Problem Relation Age of Onset  . Kidney failure Mother   . Heart disease Father   . Kidney failure Father   . Heart disease Brother     Social History    Social History   Social History  . Marital status: Single    Spouse name: N/A  . Number of children: N/A  . Years of education: N/A   Occupational History  . Retired    Social History Main Topics  . Smoking status: Former Smoker    Packs/day: 0.10    Years: 25.00    Types: Cigarettes    Quit date: 10/31/1978  . Smokeless tobacco: Never Used     Comment: smoked 2-3 cigarettes/day  . Alcohol use No     Comment: previously drank heavily but not in many years  . Drug use: No  . Sexual activity: Not on file   Other Topics Concern  . Not on file   Social History Narrative   Lives in [place] by himself.  Says he is very active at home but does not routinely exercise.  Usual diet is poor - some days he might only drink a few cups of coffee.     Review of Systems    General:  No chills, fever, night sweats or weight changes.  Cardiovascular:  No chest pain, dyspnea on exertion, edema, orthopnea, palpitations, paroxysmal nocturnal dyspnea. +++ orthostasis. Dermatological: No rash,  lesions/masses Respiratory: No cough, dyspnea Urologic: No hematuria, dysuria Abdominal:   No nausea, vomiting, +++ diarrhea, no bright red blood per rectum, melena, or hematemesis Neurologic:  L eye blindness.  No visual changes, wkns, changes in mental status. All other systems reviewed and are otherwise negative except as noted above.  Physical Exam    Blood pressure 121/61, pulse 73, temperature 97.8 F (36.6 C), temperature source Oral, resp. rate 13, height 5\' 11"  (1.803 m), weight 170 lb (77.1 kg), SpO2 99 %.  Orthostatic VS for the past 24 hrs:  BP- Lying Pulse- Lying BP- Sitting Pulse- Sitting BP- Standing at 0 minutes Pulse- Standing at 0 minutes  06/25/17 1300 100/57 70 116/52 71 (!) 77/49 72    General: Pleasant, NAD Psych: Normal affect. Neuro: Alert and oriented X 3. Moves all extremities spontaneously. HEENT: Normal  Neck: Supple without bruits or JVD. Lungs:  Resp regular and unlabored, CTA. Heart: RRR no s3, s4, or murmurs. Abdomen: Soft, non-tender, non-distended, BS + x 4.  Extremities: No clubbing, cyanosis or edema. DP/PT/Radials 2+ and equal bilaterally.  Labs     Lab Results  Component Value Date   WBC 4.6 06/23/2017   HGB 10.7 (L) 06/23/2017   HCT 31.9 (L) 06/23/2017   MCV 81.7 06/23/2017   PLT 143 (L) 06/23/2017    Recent Labs Lab 06/22/17 1527  06/26/17 0326  NA 133*  < > 137  K 4.8  < > 5.4*  CL 103  < > 104  CO2 25  < > 27  BUN 19  < > 31*  CREATININE 1.89*  < > 1.73*  CALCIUM 8.5*  < > 8.7*  PROT 6.5  --   --   BILITOT 0.5  --   --   ALKPHOS 118  --   --   ALT 23  --   --   AST 27  --   --   GLUCOSE 257*  < > 348*  < > = values in this interval not displayed.  Radiology Studies    No results found.  ECG & Cardiac Imaging    Regular sinus rhythm, 66,prior anteroseptal infarct. Baseline artifact. No acute changes.  Assessment & Plan    1. Labile hypertension/orthostatic hypotension: Patient has been admitted multiple times  since August for a variety of reasons, however in the last 2 episodes,hypotension was noted in the outpatient setting resulting in referral to the emergency department.he was readmitted earlier this week due to hypotension and initially found to be hypertensive in the emergency department. He is not on any antihypertensives at this point, though he was previously on amlodipine over the summer though apparently not taking. His systolic pressures here range from a low of 78/45 while sitting to a high of 170/61. Orthostatic vital signs show significant drop from 100/57 while lying, 116/52 while sitting, and then down to 77/49 with lightheadedness while standing. He has been wearing TED hose during admission and has no lower extremity swelling. Echocardiogram showed normal LV function with grade 2 diastolic dysfunction.  I agree that autonomic dysfxn may be playing a role here.  ? Role of diabetes as well.  Will consider northera vs florinef - discuss with Dr. Rockey Situ.   2.  CKD III:  Stable following IVF on admission.  3.  DM II:  Historically labile sugars.  Likely noncompliant @ home.  He says his diet at home is quite poor and that the best meals he receives are when he is hospitalized.  4. Anemia of chronic disease: Stable.  5. Hyperkalemia: Mild in the setting of CKD. Follow.  Signed, Murray Hodgkins, NP 06/26/2017, 11:22 AM

## 2017-06-26 NOTE — Care Management Important Message (Signed)
Important Message  Patient Details  Name: Cameron Adams MRN: [id number] Date of Birth: Jan 31, 1951   Medicare Important Message Given:  Yes    Jolly Mango, RN 06/26/2017, 2:19 PM

## 2017-06-27 LAB — COMPREHENSIVE METABOLIC PANEL
ALBUMIN: 3.4 g/dL — AB (ref 3.5–5.0)
ALK PHOS: 132 U/L — AB (ref 38–126)
ALT: 25 U/L (ref 17–63)
AST: 34 U/L (ref 15–41)
Anion gap: 4 — ABNORMAL LOW (ref 5–15)
BUN: 32 mg/dL — AB (ref 6–20)
CALCIUM: 8.4 mg/dL — AB (ref 8.9–10.3)
CO2: 26 mmol/L (ref 22–32)
Chloride: 107 mmol/L (ref 101–111)
Creatinine, Ser: 1.69 mg/dL — ABNORMAL HIGH (ref 0.61–1.24)
GFR calc Af Amer: 47 mL/min — ABNORMAL LOW (ref >60)
GFR, EST NON AFRICAN AMERICAN: 40 mL/min — AB (ref >60)
GLUCOSE: 274 mg/dL — AB (ref 65–99)
Potassium: 5.6 mmol/L — ABNORMAL HIGH (ref 3.5–5.1)
Sodium: 137 mmol/L (ref 135–145)
TOTAL PROTEIN: 6.8 g/dL (ref 6.5–8.1)
Total Bilirubin: 0.4 mg/dL (ref 0.3–1.2)

## 2017-06-27 LAB — CBC
HEMATOCRIT: 33.6 % — AB (ref 40.0–52.0)
HEMOGLOBIN: 11.2 g/dL — AB (ref 13.0–18.0)
MCH: 27.4 pg (ref 26.0–34.0)
MCHC: 33.4 g/dL (ref 32.0–36.0)
MCV: 81.9 fL (ref 80.0–100.0)
Platelets: 148 10*3/uL — ABNORMAL LOW (ref 150–440)
RBC: 4.11 MIL/uL — ABNORMAL LOW (ref 4.40–5.90)
RDW: 14.2 % (ref 11.5–14.5)
WBC: 5.6 10*3/uL (ref 3.8–10.6)

## 2017-06-27 LAB — GLUCOSE, CAPILLARY
GLUCOSE-CAPILLARY: 90 mg/dL (ref 65–99)
GLUCOSE-CAPILLARY: 58 mg/dL — AB (ref 65–99)
Glucose-Capillary: 238 mg/dL — ABNORMAL HIGH (ref 65–99)
Glucose-Capillary: 213 mg/dL — ABNORMAL HIGH (ref 65–99)
Glucose-Capillary: 133 mg/dL — ABNORMAL HIGH (ref 65–99)

## 2017-06-27 LAB — C-REACTIVE PROTEIN

## 2017-06-27 MED ORDER — INSULIN GLARGINE 100 UNIT/ML ~~LOC~~ SOLN
15.0000 [IU] | Freq: Every day | SUBCUTANEOUS | Status: DC
  Administered 2017-06-27: 23:00:00 15 [IU] via SUBCUTANEOUS
  Filled 2017-06-27 (×2): qty 0.15

## 2017-06-27 MED ORDER — INSULIN ASPART 100 UNIT/ML ~~LOC~~ SOLN
5.0000 [IU] | Freq: Three times a day (TID) | SUBCUTANEOUS | Status: DC
  Administered 2017-06-27 (×3): 5 [IU] via SUBCUTANEOUS
  Filled 2017-06-27 (×3): qty 1

## 2017-06-27 MED ORDER — PATIROMER SORBITEX CALCIUM 8.4 G PO PACK
8.4000 g | PACK | Freq: Every day | ORAL | Status: DC
  Administered 2017-06-27: 8.4 g via ORAL
  Filled 2017-06-27 (×3): qty 4

## 2017-06-27 MED ORDER — PANTOPRAZOLE SODIUM 40 MG PO TBEC
40.0000 mg | DELAYED_RELEASE_TABLET | Freq: Every day | ORAL | Status: DC
  Administered 2017-06-28 – 2017-06-29 (×2): 40 mg via ORAL
  Filled 2017-06-27 (×2): qty 1

## 2017-06-27 NOTE — Progress Notes (Signed)
Mr. Dentler called out to c/o drop in blood sugar. RN gave oj x2, graham crackers and peanut butter. Checked blood glucose - 58. RN paged MD to report as insulin sliding scale may need adjustment. Advised Mr. Ilic not to walk independently as he is already orthostatic and with low blood sugar he is at higher risk for falls - please call RN for ambulation. Mr. Harbor verablized understanding and agreed.

## 2017-06-27 NOTE — Progress Notes (Signed)
Jonathon Bellows MD, MRCP(U.K) [address]  [place]  [place], [place] [address]  Main: [phone]    Cameron Adams is being followed for diarrhea   Subjective: He says he had further large diarrhea last night    Objective: Vital signs in last 24 hours: Vitals:   06/26/17 2045 06/26/17 2153 06/27/17 0545 06/27/17 1538  BP: (!) 180/77 (!) 162/65 (!) 157/68 (!) 183/66  Pulse: 69 66 72 65  Resp: 16  19   Temp: 97.6 F (36.4 C)  97.9 F (36.6 C) 97.9 F (36.6 C)  TempSrc: Oral  Oral Oral  SpO2: 100%  95% 100%  Weight:      Height:       Weight change:   Intake/Output Summary (Last 24 hours) at 06/27/17 1601 Last data filed at 06/27/17 0900  Gross per 24 hour  Intake              240 ml  Output                0 ml  Net              240 ml     Exam: Heart:: Regular rate and rhythm, S1S2 present or without murmur or extra heart sounds Lungs: normal, clear to auscultation and clear to auscultation and percussion Abdomen: soft, nontender, normal bowel sounds   Lab Results: @LABTEST2 @ Micro Results: Recent Results (from the past 240 hour(s))  C difficile quick scan w PCR reflex     Status: None   Collection Time: 06/23/17 12:15 PM  Result Value Ref Range Status   C Diff antigen NEGATIVE NEGATIVE Final   C Diff toxin NEGATIVE NEGATIVE Final   C Diff interpretation No C. difficile detected.  Final  Gastrointestinal Panel by PCR , Stool     Status: None   Collection Time: 06/23/17 12:15 PM  Result Value Ref Range Status   Campylobacter species NOT DETECTED NOT DETECTED Final   Plesimonas shigelloides NOT DETECTED NOT DETECTED Final   Salmonella species NOT DETECTED NOT DETECTED Final   Yersinia enterocolitica NOT DETECTED NOT DETECTED Final   Vibrio species NOT DETECTED NOT DETECTED Final   Vibrio cholerae NOT DETECTED NOT DETECTED Final   Enteroaggregative E coli (EAEC) NOT DETECTED NOT DETECTED Final   Enteropathogenic E coli (EPEC) NOT DETECTED NOT  DETECTED Final   Enterotoxigenic E coli (ETEC) NOT DETECTED NOT DETECTED Final   Shiga like toxin producing E coli (STEC) NOT DETECTED NOT DETECTED Final   Shigella/Enteroinvasive E coli (EIEC) NOT DETECTED NOT DETECTED Final   Cryptosporidium NOT DETECTED NOT DETECTED Final   Cyclospora cayetanensis NOT DETECTED NOT DETECTED Final   Entamoeba histolytica NOT DETECTED NOT DETECTED Final   Giardia lamblia NOT DETECTED NOT DETECTED Final   Adenovirus F40/41 NOT DETECTED NOT DETECTED Final   Astrovirus NOT DETECTED NOT DETECTED Final   Norovirus GI/GII NOT DETECTED NOT DETECTED Final   Rotavirus A NOT DETECTED NOT DETECTED Final   Sapovirus (I, II, IV, and V) NOT DETECTED NOT DETECTED Final   Studies/Results: No results found. Medications: I have reviewed the patient's current medications. Scheduled Meds: . aspirin EC  81 mg Oral Daily  . atorvastatin  40 mg Oral QHS  . citalopram  20 mg Oral Daily  . gabapentin  100 mg Oral TID  . heparin  5,000 Units Subcutaneous Q8H  . insulin aspart  0-5 Units Subcutaneous QHS  . insulin aspart  0-9 Units  Subcutaneous TID WC  . insulin aspart  5 Units Subcutaneous TID AC  . insulin glargine  15 Units Subcutaneous QHS  . levothyroxine  200 mcg Oral QAC breakfast  . lipase/protease/amylase  36,000 Units Oral BID BM  . lipase/protease/amylase  72,000 Units Oral TID WC  . midodrine  5 mg Oral TID WC  . [START ON 06/28/2017] pantoprazole  40 mg Oral Daily  . patiromer  8.4 g Oral Daily  . protein supplement  8 oz Oral BID BM   Continuous Infusions: PRN Meds:.acetaminophen, HYDROcodone-acetaminophen, hydrOXYzine, loperamide, polyvinyl alcohol   Assessment: Principal Problem:   Dizziness  Cameron Adams is a 66 y.o. y/o male with [facility] evaluation at [facility] and [facility] for chronic diarrhea. Apart from a stool fat , am unable to find any other specific evaluation tests for diarrhea that has been chronic. Last colonoscopy was back in 2011 . Severe  diarrhea with hypotension requiring hospital admission . Very likely has an osmotic diarrhea from sugars in diet as he says the diarrhea is after meals and proportional to the intake of food. It could also be from autonomic dysfunction from long standing diabetes or from SIBO  Plan  1. F/u Celiac serology , Fecal calpropectin to r/o inflammation CRP  2. F/u  stool osmolality , stool sodium, potassium , chloride to calculate osmolar gap , stool Ph  3. Dietician consult - Commence on a diet with no artificial sugars, dairy products- presently is on ensure which has high fructose corn syrup which can cause an osmotic diarrhea  4. Continue CREON 5. He is not interested in an EGD+colonoscopy tomorrow to evaluate diarrhea. He wants it done by Dr Gustavo Lah as an outpatient  6. Can increase dose of Imodium if he has further diarrhea today   I will sign off.  Please call me if any further GI concerns or questions.  We would like to thank you for the opportunity to participate in the care of Cameron Adams.      LOS: 2 days   Jonathon Bellows 06/27/2017, 4:01 PM

## 2017-06-27 NOTE — Progress Notes (Signed)
PHARMACIST - PHYSICIAN COMMUNICATION  DR:   Anselm Jungling  CONCERNING: IV to Oral Route Change Policy  RECOMMENDATION: This patient is receiving pantoprazole by the intravenous route.  Based on criteria approved by the Pharmacy and Therapeutics Committee, the intravenous medication(s) is/are being converted to the equivalent oral dose form(s).   DESCRIPTION: These criteria include:  The patient is eating (either orally or via tube) and/or has been taking other orally administered medications for a least 24 hours  The patient has no evidence of active gastrointestinal bleeding or impaired GI absorption (gastrectomy, short bowel, patient on TNA or NPO).  If you have questions about this conversion, please contact the [facility] 06/27/2017 2:17 PM

## 2017-06-27 NOTE — Progress Notes (Signed)
[place] [facility] [place] NAME: Cameron Adams    MR#:  [id number]  DATE OF BIRTH:  June 11, 1951  SUBJECTIVE:  CHIEF COMPLAINT:  Patient is reporting dizziness when he stands up and diarrhea with loose watery bowel movements. Orthostatic vitals Improving, still have loose BM.  REVIEW OF SYSTEMS:  CONSTITUTIONAL: No fever,positive for dizziness, fatigue or weakness.  EYES: No blurred or double vision.  EARS, NOSE, AND THROAT: No tinnitus or ear pain.  RESPIRATORY: No cough, shortness of breath, wheezing or hemoptysis.  CARDIOVASCULAR: No chest pain, orthopnea, edema.  GASTROINTESTINAL: No nausea, vomiting, Reporting diarrhea but  no abdominal pain GENITOURINARY: No dysuria, .hematuria.  ENDOCRINE: No polyuria, nocturia,  HEMATOLOGY: No anemia, easy bruising or bleeding SKIN: No rash or lesion. MUSCULOSKELETAL: No joint pain or arthritis.   NEUROLOGIC: No tingling, numbness, weakness.  PSYCHIATRY: No anxiety or depression.   DRUG ALLERGIES:  No Known Allergies  VITALS:  Blood pressure (!) 183/66, pulse 65, temperature 97.9 F (36.6 C), temperature source Oral, resp. rate 19, height 5\' 11"  (1.803 m), weight 77.1 kg (170 lb), SpO2 100 %.  PHYSICAL EXAMINATION:  GENERAL:  66 y.o.-year-old patient lying in the bed with no acute distress.  EYES: Pupils equal, round, reactive to light and accommodation. No scleral icterus. Extraocular muscles intact.  HEENT: Head atraumatic, normocephalic. Oropharynx and nasopharynx clear.  NECK:  Supple, no jugular venous distention. No thyroid enlargement, no tenderness.  LUNGS: Normal breath sounds bilaterally, no wheezing, rales,rhonchi or crepitation. No use of accessory muscles of respiration.  CARDIOVASCULAR: S1, S2 normal. No murmurs, rubs, or gallops.  ABDOMEN: Soft, nontender, nondistended. Bowel sounds present. No organomegaly or mass.  EXTREMITIES: No pedal edema, cyanosis, or clubbing.   NEUROLOGIC: Cranial nerves II through XII are intact. Muscle strength 5/5 in all extremities. Sensation intact. Gait not checked.  PSYCHIATRIC: The patient is alert and oriented x 3.  SKIN: No obvious rash, lesion, or ulcer.    LABORATORY PANEL:   CBC  Recent Labs Lab 06/27/17 0602  WBC 5.6  HGB 11.2*  HCT 33.6*  PLT 148*   ------------------------------------------------------------------------------------------------------------------  Chemistries   Recent Labs Lab 06/27/17 0602  NA 137  K 5.6*  CL 107  CO2 26  GLUCOSE 274*  BUN 32*  CREATININE 1.69*  CALCIUM 8.4*  AST 34  ALT 25  ALKPHOS 132*  BILITOT 0.4   ------------------------------------------------------------------------------------------------------------------  Cardiac Enzymes No results for input(s): TROPONINI in the last 168 hours. ------------------------------------------------------------------------------------------------------------------  RADIOLOGY:  No results found.  EKG:   Orders placed or performed during the hospital encounter of 06/22/17  . ED EKG  . ED EKG  . EKG 12-Lead  . EKG 12-Lead    ASSESSMENT AND PLAN:     #Hypotension  Initial hypotension could be from dehydration from diarrhea and orthostatic hypotension  stool neg for C. difficile toxin and GI panel Hydrated with IV fluids Significant drop in orthostatics-patient is reporting dizziness when he stands up Patient was started on Norvasc for blood pressure control again on 2nd day this admission, and his BP was dropping now significantly- so stopped. [facility] endocrinologist has recommended ACTH, cortisol, metanephrines and VIP-Labs done results reviewed, outpatient follow-up with endocrinology is recommended TSH- 12.2, free T4 is in the normal range, dose increased recently 2 days ago from 150 to 200  echocardiogram -left ventricular ejection fraction 55-60% with no regional wall motion abnormalities  I advised on  slow rising technique and taking a break of  1 min on standing to allow BP to adjust.  Called cardiology consult to help. Started on midodrine.  #Acute on chronic kidney disease stage III Renal function is better with IV fluids Creatinine 1.89-1.73 -1.62 Avoid nephrotoxins  #Diabetes mellitus-uncontrolled Sliding scale insulin. Continue Lantus  medication, titrate as needed His blood sugar goes up to 90 on lower side, so will only increase lantus reluctantly. Hemoglobin A1c 13.4 Outpatient follow-up with endocrinology as recommended  # Chronic diarrhea   Checked for C diff and GI panel, started on imodium.    Still cont to have diarrhea    Called GI consult.   Ordered some more stool studies, and depending on that will decide Colonoscopy on Sunday.  #Chronic dysphagia Patient had a barium study done during the last admission. Outpatient follow-up with GI is recommended.  # refractory hyperkalemia    Recurrent, inspite of no medications causing that.    [place] and call nephrology consult.    # MGUS per previous records Op f/u with Oncology dr.Finnegan  PT eval   All the records are reviewed and case discussed with Care Management/Social Workerr. Management plans discussed with the patient, daughter [name] and they are in agreement.  CODE STATUS: FC  TOTAL TIME TAKING CARE OF THIS PATIENT: 35 minutes.  Called nephrology consult due to hyperkalemia.  POSSIBLE D/C IN 1-2 DAYS, DEPENDING ON CLINICAL CONDITION.  Note: This dictation was prepared with Dragon dictation along with smaller phrase technology. Any transcriptional errors that result from this process are unintentional.   Vaughan Basta M.D on 06/27/2017 at 3:39 PM  Between 7am to 6pm - Pager - [phone] After 6pm go to [email or web address] - password [email or web address] [facility]  Office  [phone]  CC: Primary care physician; [name], MD

## 2017-06-28 DIAGNOSIS — I951 Orthostatic hypotension: Principal | ICD-10-CM

## 2017-06-28 LAB — GLUCOSE, CAPILLARY
GLUCOSE-CAPILLARY: 222 mg/dL — AB (ref 65–99)
GLUCOSE-CAPILLARY: 288 mg/dL — AB (ref 65–99)
Glucose-Capillary: 220 mg/dL — ABNORMAL HIGH (ref 65–99)
Glucose-Capillary: 97 mg/dL (ref 65–99)
Glucose-Capillary: 210 mg/dL — ABNORMAL HIGH (ref 65–99)

## 2017-06-28 LAB — POTASSIUM: Potassium: 5.2 mmol/L — ABNORMAL HIGH (ref 3.5–5.1)

## 2017-06-28 LAB — BASIC METABOLIC PANEL
Anion gap: 4 — ABNORMAL LOW (ref 5–15)
BUN: 33 mg/dL — AB (ref 6–20)
CALCIUM: 8.7 mg/dL — AB (ref 8.9–10.3)
CHLORIDE: 106 mmol/L (ref 101–111)
CO2: 26 mmol/L (ref 22–32)
CREATININE: 1.65 mg/dL — AB (ref 0.61–1.24)
GFR calc non Af Amer: 41 mL/min — ABNORMAL LOW (ref >60)
GFR, EST AFRICAN AMERICAN: 48 mL/min — AB (ref >60)
Glucose, Bld: 312 mg/dL — ABNORMAL HIGH (ref 65–99)
Potassium: 6.2 mmol/L — ABNORMAL HIGH (ref 3.5–5.1)
SODIUM: 136 mmol/L (ref 135–145)

## 2017-06-28 MED ORDER — FLUDROCORTISONE ACETATE 0.1 MG PO TABS
0.1000 mg | ORAL_TABLET | Freq: Every day | ORAL | Status: DC
  Administered 2017-06-28 – 2017-06-29 (×2): 0.1 mg via ORAL
  Filled 2017-06-28 (×3): qty 1

## 2017-06-28 MED ORDER — SODIUM CHLORIDE 0.9 % IV SOLN
INTRAVENOUS | Status: AC
  Administered 2017-06-28: 17:00:00 via INTRAVENOUS

## 2017-06-28 MED ORDER — INSULIN ASPART 100 UNIT/ML ~~LOC~~ SOLN
3.0000 [IU] | Freq: Three times a day (TID) | SUBCUTANEOUS | Status: DC
  Administered 2017-06-28 – 2017-06-29 (×4): 3 [IU] via SUBCUTANEOUS
  Filled 2017-06-28 (×3): qty 1

## 2017-06-28 MED ORDER — INSULIN GLARGINE 100 UNIT/ML ~~LOC~~ SOLN
12.0000 [IU] | Freq: Every day | SUBCUTANEOUS | Status: DC
  Administered 2017-06-28: 23:00:00 12 [IU] via SUBCUTANEOUS
  Filled 2017-06-28 (×2): qty 0.12

## 2017-06-28 MED ORDER — SODIUM POLYSTYRENE SULFONATE 15 GM/60ML PO SUSP
30.0000 g | Freq: Once | ORAL | Status: AC
  Administered 2017-06-28: 30 g via ORAL
  Filled 2017-06-28: qty 120
  Filled 2017-06-28: qty 60

## 2017-06-28 MED ORDER — INSULIN ASPART 100 UNIT/ML ~~LOC~~ SOLN
3.0000 [IU] | Freq: Three times a day (TID) | SUBCUTANEOUS | Status: DC
Start: 2017-06-28 — End: 2017-06-28
  Administered 2017-06-28: 3 [IU] via SUBCUTANEOUS

## 2017-06-28 MED ORDER — PATIROMER SORBITEX CALCIUM 8.4 G PO PACK
16.8000 g | PACK | Freq: Every day | ORAL | Status: DC
  Administered 2017-06-28: 11:00:00 16.8 g via ORAL
  Filled 2017-06-28 (×2): qty 4

## 2017-06-28 NOTE — Progress Notes (Signed)
Progress Note  Patient Name: Cameron Adams Date of Encounter: 06/28/2017  Primary Cardiologist: formerly Joaquim Nam, MD but has not seen him since 2015  Subjective   The patient continues to have significant orthostatic hypotension and dizziness in spite of starting on Midodrin. He continues to have frequent diarrhea.  Inpatient Medications    Scheduled Meds: . aspirin EC  81 mg Oral Daily  . atorvastatin  40 mg Oral QHS  . citalopram  20 mg Oral Daily  . fludrocortisone  0.1 mg Oral Daily  . gabapentin  100 mg Oral TID  . heparin  5,000 Units Subcutaneous Q8H  . insulin aspart  0-5 Units Subcutaneous QHS  . insulin aspart  0-9 Units Subcutaneous TID WC  . insulin aspart  3 Units Subcutaneous TID AC  . insulin glargine  12 Units Subcutaneous QHS  . levothyroxine  200 mcg Oral QAC breakfast  . lipase/protease/amylase  36,000 Units Oral BID BM  . lipase/protease/amylase  72,000 Units Oral TID WC  . midodrine  5 mg Oral TID WC  . pantoprazole  40 mg Oral Daily  . patiromer  16.8 g Oral Daily  . protein supplement  8 oz Oral BID BM   Continuous Infusions:  PRN Meds: acetaminophen, HYDROcodone-acetaminophen, hydrOXYzine, loperamide, polyvinyl alcohol   Vital Signs    Vitals:   06/27/17 2104 06/28/17 0521 06/28/17 0850 06/28/17 1206  BP: (!) 100/51 (!) 100/59 (!) 103/58 (!) 102/48  Pulse: 69 67 69 66  Resp:  20 18 20   Temp:  98.6 F (37 C) 98.4 F (36.9 C) (!) 97.5 F (36.4 C)  TempSrc:   Oral Oral  SpO2: 100% 100% 100% 93%  Weight:      Height:        Intake/Output Summary (Last 24 hours) at 06/28/17 1314 Last data filed at 06/28/17 0800  Gross per 24 hour  Intake              480 ml  Output                0 ml  Net              480 ml   Filed Weights   06/22/17 1443  Weight: 170 lb (77.1 kg)    Telemetry     ECG     Physical Exam   GEN: No acute distress.   Neck: No JVD Cardiac: RRR, no murmurs, rubs, or gallops.  Respiratory: Clear to  auscultation bilaterally. GI: Soft, nontender, non-distended  MS: No edema; No deformity. Neuro:  Nonfocal  Psych: Normal affect   Labs    Chemistry Recent Labs Lab 06/22/17 1527  06/26/17 0326 06/26/17 1245 06/27/17 0602 06/28/17 0644  NA 133*  < > 137  --  137 136  K 4.8  < > 5.4* 4.3 5.6* 6.2*  CL 103  < > 104  --  107 106  CO2 25  < > 27  --  26 26  GLUCOSE 257*  < > 348*  --  274* 312*  BUN 19  < > 31*  --  32* 33*  CREATININE 1.89*  < > 1.73*  --  1.69* 1.65*  CALCIUM 8.5*  < > 8.7*  --  8.4* 8.7*  PROT 6.5  --   --   --  6.8  --   ALBUMIN 3.1*  --   --   --  3.4*  --   AST 27  --   --   --  34  --   ALT 23  --   --   --  25  --   ALKPHOS 118  --   --   --  132*  --   BILITOT 0.5  --   --   --  0.4  --   GFRNONAA 35*  < > 39*  --  40* 41*  GFRAA 41*  < > 45*  --  47* 48*  ANIONGAP 5  < > 6  --  4* 4*  < > = values in this interval not displayed.   Hematology Recent Labs Lab 06/22/17 1527 06/23/17 1028 06/27/17 0602  WBC 4.5 4.6 5.6  RBC 4.00* 3.90* 4.11*  HGB 10.9* 10.7* 11.2*  HCT 32.8* 31.9* 33.6*  MCV 82.1 81.7 81.9  MCH 27.2 27.5 27.4  MCHC 33.1 33.7 33.4  RDW 14.1 13.8 14.2  PLT 151 143* 148*    Cardiac EnzymesNo results for input(s): TROPONINI in the last 168 hours. No results for input(s): TROPIPOC in the last 168 hours.   BNPNo results for input(s): BNP, PROBNP in the last 168 hours.   DDimer No results for input(s): DDIMER in the last 168 hours.   Radiology    No results found.  Cardiac Studies   Echocardiogram 09/18: - Left ventricle: The cavity size was normal. There was mild   concentric hypertrophy. Systolic function was normal. The   estimated ejection fraction was in the range of 55% to 60%. Wall   motion was normal; there were no regional wall motion   abnormalities. Features are consistent with a pseudonormal left   ventricular filling pattern, with concomitant abnormal relaxation   and increased filling pressure (grade 2  diastolic dysfunction). - Ascending aorta: The ascending aorta was mildly dilated, 3.9 cm. - Left atrium: The atrium was mildly dilated. - Right ventricle: Systolic function was normal. - Pulmonary arteries: Systolic pressure was within the normal   range.  Patient Profile     66 y.o. male history of chronic kidney disease, chronic diarrhea, orthostatic hypotension, diabetes mellitus, hypothyroidism and anemia who presented with severe orthostatic hypotension  Assessment & Plan    1. Severe orthostatic hypotension: No significant improvement with Midodrin 5 mg 3 times daily. It is possible that the patient has significant autonomic dysfunction. I elected to add small dose Florinef 0.1 mg once daily. This might help with his hyperkalemia as well. I do not recommend using higher dose of Florinef than this given the risk of increased blood pressure with the patient's known history of hypertension. We should consider using Northera but this medication is not on formulary . This can be tried as an outpatient if needed.  2. Chronic kidney disease: Stable.    For questions or updates, please contact [place] Please consult [email or web address] for contact info under Cardiology/STEMI.      Signed, Kathlyn Sacramento, MD  06/28/2017, 1:14 PM

## 2017-06-28 NOTE — Progress Notes (Signed)
Patients called requesting to have his blood sugar checked because he stated, "My sugar feels low". Patient's blood sugar was 222. Patient also noted with abnormal orthostatic blood pressures and was extensively educated on the safety risk of his blood pressure decreasing when he stands. Patient continued to refused to comply with the hospitals safety recommendations for high fall risk patients. MD aware. Will continue to closely monitor and endorse.

## 2017-06-28 NOTE — Consult Note (Signed)
[facility] CONSULT NOTE    Date: 06/28/2017                  Patient Name:  Cameron Adams  MRN: [id number]  DOB: 05/17/1951  Age / Sex: 66 y.o., male         PCP: [name], MD                 Service Requesting Consult: Hospitalist                 Reason for Consult: Acute renal failure, Chronic kidney disease stage III, hyperkalemia            History of Present Illness: Patient is a 66 y.o. male with a PMHx of Left eye blindness, diabetes mellitus type 2, hypertension, syncope, and chronic kidney disease, who was admitted to [facility] on 06/22/2017 for evaluation of lightheadedness and dizziness. On the day of admission the patient went to his primary care physician's office. When he got there he was feeling quite dizzy and he was found to be hypotensive.  He states that he has symptoms of dizziness primarily when he stands. When he arrived at the emergency department here however his blood pressure was noted to be high.  We are asked to see him for evaluation management of acute renal failure in the setting of chronic kidney disease stage III. His baseline creatinine appears to be 1.4 on 06/19/2017. Creatinine is currently up to 1.7.  He has been started on Florinef. In addition he is on midodrine 5 mg by mouth 3 times a day.   Medications: Outpatient medications: Prescriptions Prior to Admission  Medication Sig Dispense Refill Last Dose  . aspirin EC 81 MG tablet Take 81 mg by mouth daily.   06/22/2017 at 0800  . atorvastatin (LIPITOR) 40 MG tablet Take 40 mg by mouth at bedtime.   06/21/2017 at 2000  . citalopram (CELEXA) 20 MG tablet Take 20 mg by mouth daily.   06/22/2017 at 0800  . gabapentin (NEURONTIN) 100 MG capsule TAKE 1-3 CAPSULES BY MOUTH BEFORE BEDTIME FOR ARM PAIN   06/21/2017 at 2000  . HYDROcodone-acetaminophen (NORCO) 5-325 MG tablet Take 1 tablet by mouth every 6 (six) hours as needed for moderate pain. 20 tablet 0 prn at prn  .  hydroxypropyl methylcellulose / hypromellose (ISOPTO TEARS / GONIOVISC) 2.5 % ophthalmic solution Place 1 drop into both eyes 3 (three) times daily as needed for dry eyes.   PRN at PRN  . hydrOXYzine (ATARAX/VISTARIL) 10 MG tablet Take 10 mg by mouth every 8 (eight) hours as needed for itching.    PRN at PRN  . insulin aspart (NOVOLOG) 100 UNIT/ML injection Inject 5 Units into the skin 3 (three) times daily before meals.   06/21/2017 at 2000  . insulin glargine (LANTUS) 100 UNIT/ML injection Inject 0.1 mLs (10 Units total) into the skin at bedtime. 10 mL 11 06/21/2017 at 2000  . levothyroxine (SYNTHROID, LEVOTHROID) 200 MCG tablet Take 1 tablet (200 mcg total) by mouth daily before breakfast. 30 tablet 0 06/22/2017 at 0800  . lipase/protease/amylase (CREON) 12000 units CPEP capsule Take 24,000 Units by mouth 3 (three) times daily with meals.    06/22/2017 at 2000  . feeding supplement, ENSURE ENLIVE, (ENSURE ENLIVE) LIQD Take 237 mLs by mouth 3 (three) times daily between meals. (Patient not taking: Reported on 06/22/2017) 237 mL 12 Not Taking at Unknown time  . pantoprazole sodium (PROTONIX) 40 mg/20 mL  PACK Take 20 mLs (40 mg total) by mouth 2 (two) times daily before a meal. (Patient not taking: Reported on 06/16/2017) 30 each 0 Not Taking at Unknown time    Current medications: Current Facility-Administered Medications  Medication Dose Route Frequency Provider Last Rate Last Dose  . acetaminophen (TYLENOL) tablet 650 mg  650 mg Oral Q6H PRN Gouru, Aruna, MD      . aspirin EC tablet 81 mg  81 mg Oral Daily Vaughan Basta, MD   81 mg at 06/28/17 0851  . atorvastatin (LIPITOR) tablet 40 mg  40 mg Oral QHS Vaughan Basta, MD   40 mg at 06/27/17 2237  . citalopram (CELEXA) tablet 20 mg  20 mg Oral Daily Vaughan Basta, MD   20 mg at 06/28/17 0851  . fludrocortisone (FLORINEF) tablet 0.1 mg  0.1 mg Oral Daily Kathlyn Sacramento A, MD   0.1 mg at 06/28/17 1527  . gabapentin (NEURONTIN)  capsule 100 mg  100 mg Oral TID Vaughan Basta, MD   100 mg at 06/28/17 1551  . heparin injection 5,000 Units  5,000 Units Subcutaneous Q8H Vaughan Basta, MD   5,000 Units at 06/27/17 2237  . HYDROcodone-acetaminophen (NORCO/VICODIN) 5-325 MG per tablet 1 tablet  1 tablet Oral Q6H PRN Vaughan Basta, MD      . hydrOXYzine (ATARAX/VISTARIL) tablet 10 mg  10 mg Oral Q8H PRN Vaughan Basta, MD      . insulin aspart (novoLOG) injection 0-5 Units  0-5 Units Subcutaneous QHS Nicholes Mango, MD   0 Units at 06/27/17 2100  . insulin aspart (novoLOG) injection 0-9 Units  0-9 Units Subcutaneous TID WC Nicholes Mango, MD   3 Units at 06/28/17 1206  . insulin aspart (novoLOG) injection 3 Units  3 Units Subcutaneous TID AC Candelaria Stagers, RPH   3 Units at 06/28/17 1207  . insulin glargine (LANTUS) injection 12 Units  12 Units Subcutaneous QHS Vaughan Basta, MD      . levothyroxine (SYNTHROID, LEVOTHROID) tablet 200 mcg  200 mcg Oral QAC breakfast Vaughan Basta, MD   200 mcg at 06/28/17 [phone]  . lipase/protease/amylase (CREON) capsule 36,000 Units  36,000 Units Oral BID BM Vaughan Basta, MD   36,000 Units at 06/28/17 1425  . lipase/protease/amylase (CREON) capsule 72,000 Units  72,000 Units Oral TID WC Jonathon Bellows, MD   72,000 Units at 06/28/17 1206  . loperamide (IMODIUM) capsule 2 mg  2 mg Oral Q6H PRN Vaughan Basta, MD   2 mg at 06/26/17 1810  . midodrine (PROAMATINE) tablet 5 mg  5 mg Oral TID WC Minna Merritts, MD   5 mg at 06/28/17 1206  . pantoprazole (PROTONIX) EC tablet 40 mg  40 mg Oral Daily Lenis Noon, RPH   40 mg at 06/28/17 2947  . patiromer Daryll Drown) packet 16.8 g  16.8 g Oral Daily Amandalynn Pitz, MD   16.8 g at 06/28/17 1053  . polyvinyl alcohol (LIQUIFILM TEARS) 1.4 % ophthalmic solution 1 drop  1 drop Both Eyes TID PRN Vaughan Basta, MD      . protein supplement (UNJURY CHICKEN SOUP) powder 8 oz  8 oz Oral BID  BM Vaughan Basta, MD   8 oz at 06/28/17 1425      Allergies: No Known Allergies    Past Medical History: Past Medical History:  Diagnosis Date  . Anemia of chronic disease   . Blindness of left eye   . Chronic kidney disease, stage III (moderate)   . Diabetes ([place])   .  Diarrhea   . Diastolic dysfunction    a. 06/2017 Echo: EF 55-60%, no rwma, Gr2 DD, Asc Ao 3.9cm, nl RV fxn, nl PASP.  Marland Kitchen Hypertension   . Hypothyroidism   . Monoclonal gammopathy of unknown significance (MGUS)    a. prev seen @ [facility].  Lost to f/u - never had BM bx.  . Orthostatic hypotension   . Syncope and collapse      Past Surgical History: Past Surgical History:  Procedure Laterality Date  . 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 PERIPHERAL VASCULAR CATHETERIZATION N/A 09/15/2016   Procedure: Lower Extremity Angiography;  Surgeon: Algernon Huxley, MD;  Location: [facility];  Service: Cardiovascular;  Laterality: N/A;  . THYROID SURGERY       Family History: Family History  Problem Relation Age of Onset  . Kidney failure Mother   . Heart disease Father   . Kidney failure Father   . Heart disease Brother      Social History: Social History   Social History  . Marital status: Single    Spouse name: N/A  . Number of children: N/A  . Years of education: N/A   Occupational History  . Retired    Social History Main Topics  . Smoking status: Former Smoker    Packs/day: 0.10    Years: 25.00    Types: Cigarettes    Quit date: 10/31/1978  . Smokeless tobacco: Never Used     Comment: smoked 2-3 cigarettes/day  . Alcohol use No     Comment: previously drank heavily but not in many years  . Drug use: No  . Sexual activity: Not on file   Other Topics Concern  . Not on file   Social History Narrative   Lives in [place] by himself.  Says he is very active at home  but does not routinely exercise.  Usual diet is poor - some days he might only drink a few cups of coffee.     Review of Systems: Review of Systems  Constitutional: Positive for malaise/fatigue. Negative for chills and fever.  HENT: Negative for ear discharge, hearing loss and nosebleeds.   Eyes: Positive for blurred vision.  Respiratory: Negative for cough, hemoptysis and sputum production.   Cardiovascular: Negative for chest pain, palpitations and orthopnea.  Gastrointestinal: Negative for heartburn, nausea and vomiting.  Genitourinary: Negative for dysuria, frequency and urgency.  Musculoskeletal: Negative for joint pain and myalgias.  Skin: Negative for itching and rash.  Neurological: Positive for dizziness.  Endo/Heme/Allergies: Negative for polydipsia. Does not bruise/bleed easily.  Psychiatric/Behavioral: Negative for depression. The patient is not nervous/anxious.      Vital Signs: Blood pressure (!) 102/48, pulse 66, temperature (!) 97.5 F (36.4 C), temperature source Oral, resp. rate 20, height 5\' 11"  (1.803 m), weight 77.1 kg (170 lb), SpO2 93 %.  Weight trends: Filed Weights   06/22/17 1443  Weight: 77.1 kg (170 lb)    Physical Exam: General: NAD, sitting up in chair  Head: Normocephalic, atraumatic.  Eyes: Anicteric, EOMI  Nose: Mucous membranes moist, not inflammed, nonerythematous.  Throat: Oropharynx nonerythematous, no exudate appreciated.   Neck: Supple, trachea midline.  Lungs:  Normal respiratory effort. Clear to auscultation BL without crackles or wheezes.  Heart: RRR. S1 and S2 normal without gallop, murmur, or rubs.  Abdomen:  BS normoactive. Soft, Nondistended, non-tender.  No masses or  organomegaly.  Extremities: No pretibial edema.  Neurologic: A&O X3, Motor strength is 5/5 in the all 4 extremities  Skin: No visible rashes, scars.    Lab results: Basic Metabolic Panel:  Recent Labs Lab 06/26/17 0326  06/27/17 0602 06/28/17 0644  06/28/17 1419  NA 137  --  137 136  --   K 5.4*  < > 5.6* 6.2* 5.2*  CL 104  --  107 106  --   CO2 27  --  26 26  --   GLUCOSE 348*  --  274* 312*  --   BUN 31*  --  32* 33*  --   CREATININE 1.73*  --  1.69* 1.65*  --   CALCIUM 8.7*  --  8.4* 8.7*  --   < > = values in this interval not displayed.  Liver Function Tests:  Recent Labs Lab 06/22/17 1527 06/27/17 0602  AST 27 34  ALT 23 25  ALKPHOS 118 132*  BILITOT 0.5 0.4  PROT 6.5 6.8  ALBUMIN 3.1* 3.4*   No results for input(s): LIPASE, AMYLASE in the last 168 hours. No results for input(s): AMMONIA in the last 168 hours.  CBC:  Recent Labs Lab 06/22/17 1527 06/23/17 1028 06/27/17 0602  WBC 4.5 4.6 5.6  NEUTROABS 2.7  --   --   HGB 10.9* 10.7* 11.2*  HCT 32.8* 31.9* 33.6*  MCV 82.1 81.7 81.9  PLT 151 143* 148*    Cardiac Enzymes: No results for input(s): CKTOTAL, CKMB, CKMBINDEX, TROPONINI in the last 168 hours.  BNP: Invalid input(s): POCBNP  CBG:  Recent Labs Lab 06/27/17 1902 06/27/17 2048 06/28/17 0519 06/28/17 0742 06/28/17 1132  GLUCAP 95* 133* 222* 37* 220*    Microbiology: Results for orders placed or performed during the hospital encounter of 06/22/17  C difficile quick scan w PCR reflex     Status: None   Collection Time: 06/23/17 12:15 PM  Result Value Ref Range Status   C Diff antigen NEGATIVE NEGATIVE Final   C Diff toxin NEGATIVE NEGATIVE Final   C Diff interpretation No C. difficile detected.  Final  Gastrointestinal Panel by PCR , Stool     Status: None   Collection Time: 06/23/17 12:15 PM  Result Value Ref Range Status   Campylobacter species NOT DETECTED NOT DETECTED Final   Plesimonas shigelloides NOT DETECTED NOT DETECTED Final   Salmonella species NOT DETECTED NOT DETECTED Final   Yersinia enterocolitica NOT DETECTED NOT DETECTED Final   Vibrio species NOT DETECTED NOT DETECTED Final   Vibrio cholerae NOT DETECTED NOT DETECTED Final   Enteroaggregative E coli (EAEC)  NOT DETECTED NOT DETECTED Final   Enteropathogenic E coli (EPEC) NOT DETECTED NOT DETECTED Final   Enterotoxigenic E coli (ETEC) NOT DETECTED NOT DETECTED Final   Shiga like toxin producing E coli (STEC) NOT DETECTED NOT DETECTED Final   Shigella/Enteroinvasive E coli (EIEC) NOT DETECTED NOT DETECTED Final   Cryptosporidium NOT DETECTED NOT DETECTED Final   Cyclospora cayetanensis NOT DETECTED NOT DETECTED Final   Entamoeba histolytica NOT DETECTED NOT DETECTED Final   Giardia lamblia NOT DETECTED NOT DETECTED Final   Adenovirus F40/41 NOT DETECTED NOT DETECTED Final   Astrovirus NOT DETECTED NOT DETECTED Final   Norovirus GI/GII NOT DETECTED NOT DETECTED Final   Rotavirus A NOT DETECTED NOT DETECTED Final   Sapovirus (I, II, IV, and V) NOT DETECTED NOT DETECTED Final    Coagulation Studies: No results for input(s): LABPROT, INR in the last  72 hours.  Urinalysis: No results for input(s): COLORURINE, LABSPEC, PHURINE, GLUCOSEU, HGBUR, BILIRUBINUR, KETONESUR, PROTEINUR, UROBILINOGEN, NITRITE, LEUKOCYTESUR in the last 72 hours.  Invalid input(s): APPERANCEUR    Imaging:  No results found.   Assessment & Plan: Pt is a 66 y.o. male with a PMHx of Left eye blindness, diabetes mellitus type 2, hypertension, syncope, and chronic kidney disease, who was admitted to [facility] on 06/22/2017 for evaluation of lightheadedness and dizziness.   1. Acute renal failure/chronic kidney disease stage III Baseline cranial 1.4. The patient's renal function has been fluctuating recently. This could potentially be related to hemodynamic shifts recently.  We will proceed with renal ultrasound, SPEP, and UPEP.   He is not on any IV fluids at the moment. Continue to monitor renal function daily for now. We encouraged the patient to maintain adequate fluid hydration status.  2. Orthostatic hypotension. Patient has been having significant lightheadedness and dizziness upon standing. He has been started on Florinef  as well as midodrine.  3. Hyperkalemia.  Patient has low normal sodium and high potassium. Adrenal insufficiency is being considered. Based cortisol was 6.8 with a cortisol of 23.2 at 60 minutes. We will maintain the patient on paternal murmur for now. He would likely benefit from further endocrine evaluation as an outpatient as he has diabetes, hypothyroidism, and the possibility of adrenal insufficiency.

## 2017-06-28 NOTE — Progress Notes (Signed)
[place] [facility] [place] NAME: Cameron Adams    MR#:  [id number]  DATE OF BIRTH:  1950/10/23  SUBJECTIVE:  CHIEF COMPLAINT:  Patient is reporting dizziness when he stands up and diarrhea with loose watery bowel movements. Orthostatic vitals Improving, still have loose BM.  potassium is very high again today.  REVIEW OF SYSTEMS:  CONSTITUTIONAL: No fever,positive for dizziness, fatigue or weakness.  EYES: No blurred or double vision.  EARS, NOSE, AND THROAT: No tinnitus or ear pain.  RESPIRATORY: No cough, shortness of breath, wheezing or hemoptysis.  CARDIOVASCULAR: No chest pain, orthopnea, edema.  GASTROINTESTINAL: No nausea, vomiting, Reporting diarrhea but  no abdominal pain GENITOURINARY: No dysuria, .hematuria.  ENDOCRINE: No polyuria, nocturia,  HEMATOLOGY: No anemia, easy bruising or bleeding SKIN: No rash or lesion. MUSCULOSKELETAL: No joint pain or arthritis.   NEUROLOGIC: No tingling, numbness, weakness.  PSYCHIATRY: No anxiety or depression.   DRUG ALLERGIES:  No Known Allergies  VITALS:  Blood pressure (!) 102/48, pulse 66, temperature (!) 97.5 F (36.4 C), temperature source Oral, resp. rate 20, height 5\' 11"  (1.803 m), weight 77.1 kg (170 lb), SpO2 93 %.  PHYSICAL EXAMINATION:  GENERAL:  66 y.o.-year-old patient lying in the bed with no acute distress.  EYES: Pupils equal, round, reactive to light and accommodation. No scleral icterus. Extraocular muscles intact.  HEENT: Head atraumatic, normocephalic. Oropharynx and nasopharynx clear.  NECK:  Supple, no jugular venous distention. No thyroid enlargement, no tenderness.  LUNGS: Normal breath sounds bilaterally, no wheezing, rales,rhonchi or crepitation. No use of accessory muscles of respiration.  CARDIOVASCULAR: S1, S2 normal. No murmurs, rubs, or gallops.  ABDOMEN: Soft, nontender, nondistended. Bowel sounds present. No organomegaly or mass.  EXTREMITIES: No  pedal edema, cyanosis, or clubbing.  NEUROLOGIC: Cranial nerves II through XII are intact. Muscle strength 5/5 in all extremities. Sensation intact. Gait not checked.  PSYCHIATRIC: The patient is alert and oriented x 3.  SKIN: No obvious rash, lesion, or ulcer.    LABORATORY PANEL:   CBC  Recent Labs Lab 06/27/17 0602  WBC 5.6  HGB 11.2*  HCT 33.6*  PLT 148*   ------------------------------------------------------------------------------------------------------------------  Chemistries   Recent Labs Lab 06/27/17 0602 06/28/17 0644  NA 137 136  K 5.6* 6.2*  CL 107 106  CO2 26 26  GLUCOSE 274* 312*  BUN 32* 33*  CREATININE 1.69* 1.65*  CALCIUM 8.4* 8.7*  AST 34  --   ALT 25  --   ALKPHOS 132*  --   BILITOT 0.4  --    ------------------------------------------------------------------------------------------------------------------  Cardiac Enzymes No results for input(s): TROPONINI in the last 168 hours. ------------------------------------------------------------------------------------------------------------------  RADIOLOGY:  No results found.  EKG:   Orders placed or performed during the hospital encounter of 06/22/17  . ED EKG  . ED EKG  . EKG 12-Lead  . EKG 12-Lead    ASSESSMENT AND PLAN:    #Hypotension  Initial hypotension could be from dehydration from diarrhea and orthostatic hypotension  stool neg for C. difficile toxin and GI panel Hydrated with IV fluids Significant drop in orthostatics-patient is reporting dizziness when he stands up Patient was started on Norvasc for blood pressure control again on 2nd day this admission, and his BP was dropping now significantly- so stopped. [facility] endocrinologist has recommended ACTH, cortisol, metanephrines and VIP-Labs done results reviewed, outpatient follow-up with endocrinology is recommended TSH- 12.2, free T4 is in the normal range, dose increased recently 2 days ago from  150 to 200  echocardiogram  -left ventricular ejection fraction 55-60% with no regional wall motion abnormalities  I advised on slow rising technique and taking a break of 1 min on standing to allow BP to adjust.  Called cardiology consult to help. Started on midodrine, not much help.  Added floinef today.  #Acute on chronic kidney disease stage III Renal function is better with IV fluids Creatinine 1.89-1.73 -1.62 Avoid nephrotoxins Nephro consult awaited.  #Diabetes mellitus-uncontrolled Sliding scale insulin. Continue Lantus  medication, titrate as needed With slight increase in meal coverage insuline and lantus, blood suagr again so decrease the insuline.  Hemoglobin A1c 13.4 Outpatient follow-up with endocrinology as recommended  # Chronic diarrhea   Checked for C diff and GI panel, started on imodium.    Still cont to have diarrhea    Called GI consult.   Ordered some more stool studies, offered to have scope as inpatient, but pt choose to have it as out pt.  #Chronic dysphagia Patient had a barium study done during the last admission. Outpatient follow-up with GI is recommended.  # refractory hyperkalemia    Recurrent, inspite of no medications causing that.    Valtessa and call nephrology consult.   Again > 6 today, given kayexala    # MGUS per previous records Op f/u with Oncology dr.Finnegan  PT eval   All the records are reviewed and case discussed with Care Management/Social Workerr. Management plans discussed with the patient, daughter Cameron Adams and they are in agreement.  CODE STATUS: FC  TOTAL TIME TAKING CARE OF THIS PATIENT: 35 minutes.  Called nephrology consult due to hyperkalemia.  POSSIBLE D/C IN 1-2 DAYS, DEPENDING ON CLINICAL CONDITION.  Note: This dictation was prepared with Dragon dictation along with smaller phrase technology. Any transcriptional errors that result from this process are unintentional.   Vaughan Basta M.D on 06/28/2017 at 4:00 PM  Between 7am to  6pm - Pager - [phone] After 6pm go to [email or web address] - password [email or web address] [facility]  Office  [phone]  CC: Primary care physician; Cameron Body, MD

## 2017-06-29 ENCOUNTER — Inpatient Hospital Stay: Payer: Medicare Other

## 2017-06-29 LAB — BASIC METABOLIC PANEL
Anion gap: 6 (ref 5–15)
BUN: 34 mg/dL — AB (ref 6–20)
CALCIUM: 8.5 mg/dL — AB (ref 8.9–10.3)
CO2: 26 mmol/L (ref 22–32)
CREATININE: 1.7 mg/dL — AB (ref 0.61–1.24)
Chloride: 106 mmol/L (ref 101–111)
GFR, EST AFRICAN AMERICAN: 46 mL/min — AB (ref >60)
GFR, EST NON AFRICAN AMERICAN: 40 mL/min — AB (ref >60)
Glucose, Bld: 276 mg/dL — ABNORMAL HIGH (ref 65–99)
Potassium: 5 mmol/L (ref 3.5–5.1)
SODIUM: 138 mmol/L (ref 135–145)

## 2017-06-29 LAB — GLIA (IGA/G) + TTG IGA
Antigliadin Abs, IgA: 13 units (ref 0–19)
GLIADIN IGG: 5 U (ref 0–19)

## 2017-06-29 LAB — GLUCOSE, CAPILLARY
GLUCOSE-CAPILLARY: 219 mg/dL — AB (ref 65–99)
GLUCOSE-CAPILLARY: 176 mg/dL — AB (ref 65–99)

## 2017-06-29 LAB — MISC LABCORP TEST (SEND OUT): LABCORP TEST CODE: 10991

## 2017-06-29 LAB — OSMOLALITY, STOOL: Osmolality,Stl: 486 mOsmol/kg

## 2017-06-29 MED ORDER — FLUDROCORTISONE ACETATE 0.1 MG PO TABS: 0.1000 mg | ORAL_TABLET | Freq: Every day | ORAL | 0 refills | Status: DC

## 2017-06-29 MED ORDER — LOPERAMIDE HCL 2 MG PO CAPS: 2.0000 mg | ORAL_CAPSULE | Freq: Four times a day (QID) | ORAL | 0 refills | Status: DC | PRN

## 2017-06-29 MED ORDER — MIDODRINE HCL 5 MG PO TABS: 5.0000 mg | ORAL_TABLET | Freq: Three times a day (TID) | ORAL | 0 refills | Status: DC

## 2017-06-29 MED ORDER — PANCRELIPASE (LIP-PROT-AMYL) 36000-114000 UNITS PO CPEP: 36000.0000 [IU] | ORAL_CAPSULE | Freq: Two times a day (BID) | ORAL | 0 refills | Status: DC

## 2017-06-29 MED ORDER — PANTOPRAZOLE SODIUM 40 MG PO TBEC: 40.0000 mg | DELAYED_RELEASE_TABLET | Freq: Every day | ORAL | 0 refills | Status: DC

## 2017-06-29 MED ORDER — PANCRELIPASE (LIP-PROT-AMYL) 36000-114000 UNITS PO CPEP: 72000.0000 [IU] | ORAL_CAPSULE | Freq: Three times a day (TID) | ORAL | 0 refills | Status: DC

## 2017-06-29 MED ORDER — PATIROMER SORBITEX CALCIUM 8.4 G PO PACK: 16.8000 g | PACK | Freq: Every day | ORAL | 0 refills | Status: DC

## 2017-06-29 NOTE — Progress Notes (Signed)
Inpatient Diabetes Program Recommendations  AACE/ADA: New Consensus Statement on Inpatient Glycemic Control (2015)  Target Ranges:  Prepandial:   less than 140 mg/dL      Peak postprandial:   less than 180 mg/dL (1-2 hours)      Critically ill patients:  140 - 180 mg/dL   Results for CRISTINA, MATTERN (MRN [id number]) as of 06/29/2017 07:55  Ref. Range 06/28/2017 07:42 06/28/2017 11:32 06/28/2017 16:48 06/28/2017 22:36  Glucose-Capillary Latest Ref Range: 65 - 99 mg/dL 288 (H)  8 units Novolog total 220 (H)  6 units Novolog total 97  0 units Novolog total 210 (H)  2 units Novolog total +  12 units Lantus   Results for KNIGHT, OELKERS (MRN [id number]) as of 06/29/2017 07:55  Ref. Range 06/29/2017 02:58  Glucose Latest Ref Range: 65 - 99 mg/dL 276 (H)    Home DM Meds: Lantus 10 units QHS       Novolog 5 units TID  Current Insulin Orders: Lantus 12 units QHS      Novolog Sensitive Correction Scale/ SSI (0-9 units) TID AC + HS      Novolog 3 units TID with meals      MD- Please consider the following in-hospital insulin adjustments:  1. Increase Lantus slightly to 15 units QHS  2. Increase Novolog Meal Coverage to: Novolog 5 units TID with meals (home dose)     --Will follow patient during hospitalization--  Wyn Quaker RN, MSN, CDE Diabetes Coordinator Inpatient Glycemic Control Team Team Pager: [phone] (8a-5p)

## 2017-06-29 NOTE — Care Management (Signed)
Patient is open to [facility] home health. Resumption of care note needed. I have notified [place] with [facility] of patient discharge to home today.

## 2017-06-29 NOTE — Care Management Important Message (Signed)
Important Message  Patient Details  Name: Cameron Adams MRN: [id number] Date of Birth: 1951-07-17   Medicare Important Message Given:  Yes    Marshell Garfinkel, RN 06/29/2017, 9:14 AM

## 2017-06-29 NOTE — Discharge Summary (Addendum)
[place] at [place] NAME: Cameron Adams    MR#:  [id number]  DATE OF BIRTH:  1951-06-03  DATE OF ADMISSION:  06/22/2017 ADMITTING PHYSICIAN: Vaughan Basta, MD  DATE OF DISCHARGE: 06/29/2017  PRIMARY CARE PHYSICIAN: [name], MD    ADMISSION DIAGNOSIS:  AKI (acute kidney injury) ([place]) [N17.9] Hypotension, unspecified hypotension type [I95.9] Diarrhea, unspecified type [R19.7]  DISCHARGE DIAGNOSIS:  Principal Problem:   Dizziness    Orthostatic hypotension  SECONDARY DIAGNOSIS:   Past Medical History:  Diagnosis Date  . Anemia of chronic disease   . Blindness of left eye   . Chronic kidney disease, stage III (moderate)   . Diabetes ([place])   . Diarrhea   . Diastolic dysfunction    a. 06/2017 Echo: EF 55-60%, no rwma, Gr2 DD, Asc Ao 3.9cm, nl RV fxn, nl PASP.  Marland Kitchen Hypertension   . Hypothyroidism   . Monoclonal gammopathy of unknown significance (MGUS)    a. prev seen @ [facility].  Lost to f/u - never had BM bx.  . Orthostatic hypotension   . Syncope and collapse     HOSPITAL COURSE:   #Hypotension  Initial hypotension could be from dehydration from diarrhea and orthostatic hypotension  stool neg for C. difficile toxin and GI panel Hydrated with IV fluids Significant drop in orthostatics-patient is reporting dizziness when he stands up Patient was started on Norvasc for blood pressure control again on 2nd day this admission, and his BP was dropping now significantly- so stopped. [facility] endocrinologist has recommended ACTH, cortisol, metanephrines and VIP-Labs done results reviewed, outpatient follow-up with endocrinology is recommended TSH- 12.2, free T4 is in the normal range, dose increased recently 2 days ago from 150 to 200  echocardiogram -left ventricular ejection fraction 55-60% with no regional wall motion abnormalities  I advised on slow rising technique and taking a break of 1 min on standing to allow BP  to adjust.  Called cardiology consult to help. Started on midodrine, not much help.  Added floinef today.  #Acute on chronic kidney disease stage III Renal function is better with IV fluids Creatinine 1.89-1.73 -1.62 Avoid nephrotoxins Nephro consult awaited.  #Diabetes mellitus-uncontrolled Sliding scale insulin. Continue Lantus  medication, titrate as needed With slight increase in meal coverage insuline and lantus, blood suagr again so decrease the insuline.  Hemoglobin A1c 13.4 Outpatient follow-up with endocrinology as recommended  # Chronic diarrhea   Checked for C diff and GI panel, started on imodium.    Still cont to have diarrhea    Called GI consult.   Ordered some more stool studies, offered to have scope as inpatient, but pt choose to have it as out pt.  #Chronic dysphagia Patient had a barium study done during the last admission. Outpatient follow-up with GI is recommended.  # refractory hyperkalemia    Recurrent, inspite of no medications causing that.    Valtessa and call nephrology consult.   Again > 6, given kayexalat- came down to 5.   Appreciated nephrology consult.    # MGUS per previous records   Op f/u with Oncology dr.Finnegan  Resume Home health on d/c  DISCHARGE CONDITIONS:   Stable.  CONSULTS OBTAINED:  Treatment Team:  Lloyd Huger, MD Minna Merritts, MD Anthonette Legato, MD  DRUG ALLERGIES:  No Known Allergies  DISCHARGE MEDICATIONS:   Current Discharge Medication List    START taking these medications   Details  fludrocortisone (FLORINEF) 0.1 MG  tablet Take 1 tablet (0.1 mg total) by mouth daily. Qty: 30 tablet, Refills: 0    loperamide (IMODIUM) 2 MG capsule Take 1 capsule (2 mg total) by mouth every 6 (six) hours as needed for diarrhea or loose stools. Qty: 30 capsule, Refills: 0    midodrine (PROAMATINE) 5 MG tablet Take 1 tablet (5 mg total) by mouth 3 (three) times daily with meals. Qty: 60 tablet,  Refills: 0    pantoprazole (PROTONIX) 40 MG tablet Take 1 tablet (40 mg total) by mouth daily. Qty: 30 tablet, Refills: 0    patiromer (VELTASSA) 8.4 g packet Take 2 packets (16.8 g total) by mouth daily. Qty: 30 packet, Refills: 0      CONTINUE these medications which have CHANGED   Details  !! lipase/protease/amylase (CREON) 36000 UNITS CPEP capsule Take 2 capsules (72,000 Units total) by mouth 3 (three) times daily with meals. Qty: 270 capsule, Refills: 0    !! lipase/protease/amylase (CREON) 36000 UNITS CPEP capsule Take 1 capsule (36,000 Units total) by mouth 2 (two) times daily between meals. Qty: 270 capsule, Refills: 0     !! - Potential duplicate medications found. Please discuss with provider.    CONTINUE these medications which have NOT CHANGED   Details  aspirin EC 81 MG tablet Take 81 mg by mouth daily.    atorvastatin (LIPITOR) 40 MG tablet Take 40 mg by mouth at bedtime.    citalopram (CELEXA) 20 MG tablet Take 20 mg by mouth daily.    gabapentin (NEURONTIN) 100 MG capsule TAKE 1-3 CAPSULES BY MOUTH BEFORE BEDTIME FOR ARM PAIN    HYDROcodone-acetaminophen (NORCO) 5-325 MG tablet Take 1 tablet by mouth every 6 (six) hours as needed for moderate pain. Qty: 20 tablet, Refills: 0    hydroxypropyl methylcellulose / hypromellose (ISOPTO TEARS / GONIOVISC) 2.5 % ophthalmic solution Place 1 drop into both eyes 3 (three) times daily as needed for dry eyes.    hydrOXYzine (ATARAX/VISTARIL) 10 MG tablet Take 10 mg by mouth every 8 (eight) hours as needed for itching.     insulin aspart (NOVOLOG) 100 UNIT/ML injection Inject 5 Units into the skin 3 (three) times daily before meals.    insulin glargine (LANTUS) 100 UNIT/ML injection Inject 0.1 mLs (10 Units total) into the skin at bedtime. Qty: 10 mL, Refills: 11    levothyroxine (SYNTHROID, LEVOTHROID) 200 MCG tablet Take 1 tablet (200 mcg total) by mouth daily before breakfast. Qty: 30 tablet, Refills: 0    feeding  supplement, ENSURE ENLIVE, (ENSURE ENLIVE) LIQD Take 237 mLs by mouth 3 (three) times daily between meals. Qty: 237 mL, Refills: 12      STOP taking these medications     pantoprazole sodium (PROTONIX) 40 mg/20 mL PACK          DISCHARGE INSTRUCTIONS:   Follow with PMD in 1-2 weeks Follow with endocrine clinic in 1-2 weeks.  resume home health in 1-2 weeks.  If you experience worsening of your admission symptoms, develop shortness of breath, life threatening emergency, suicidal or homicidal thoughts you must seek medical attention immediately by calling 911 or calling your MD immediately  if symptoms less severe.  You Must read complete instructions/literature along with all the possible adverse reactions/side effects for all the Medicines you take and that have been prescribed to you. Take any new Medicines after you have completely understood and accept all the possible adverse reactions/side effects.   Please note  You were cared for by a hospitalist  during your hospital stay. If you have any questions about your discharge medications or the care you received while you were in the hospital after you are discharged, you can call the unit and asked to speak with the hospitalist on call if the hospitalist that took care of you is not available. Once you are discharged, your primary care physician will handle any further medical issues. Please note that NO REFILLS for any discharge medications will be authorized once you are discharged, as it is imperative that you return to your primary care physician (or establish a relationship with a primary care physician if you do not have one) for your aftercare needs so that they can reassess your need for medications and monitor your lab values.    Today   CHIEF COMPLAINT:   Chief Complaint  Patient presents with  . Hypotension    HISTORY OF PRESENT ILLNESS:  Cyris Maalouf  is a 66 y.o. male with a known history of CKD, Htn, DM,  Hypothyroidism- Multiple admissions for labile BP and blood sugar. Sent home 3 days ago, came back again from PMD's office today, as he had low BP and feeling dizzi. C/o intermittent diarrhea. During last 2 admissions I saw him, and taken off his BP meds, decreased lantus dose, started on levothyroxine for high TSH, still the complains persists of labile BP and Blood sugar.   VITAL SIGNS:  Blood pressure (!) 107/45, pulse 74, temperature 98 F (36.7 C), temperature source Oral, resp. rate 20, height 5\' 11"  (1.803 m), weight 77.1 kg (170 lb), SpO2 100 %.  I/O:   Intake/Output Summary (Last 24 hours) at 06/29/17 1522 Last data filed at 06/29/17 1300  Gross per 24 hour  Intake              720 ml  Output                0 ml  Net              720 ml    PHYSICAL EXAMINATION:  GENERAL:  66 y.o.-year-old patient lying in the bed with no acute distress.  EYES: Pupils equal, round, reactive to light and accommodation. No scleral icterus. Extraocular muscles intact.  HEENT: Head atraumatic, normocephalic. Oropharynx and nasopharynx clear.  NECK:  Supple, no jugular venous distention. No thyroid enlargement, no tenderness.  LUNGS: Normal breath sounds bilaterally, no wheezing, rales,rhonchi or crepitation. No use of accessory muscles of respiration.  CARDIOVASCULAR: S1, S2 normal. No murmurs, rubs, or gallops.  ABDOMEN: Soft, non-tender, non-distended. Bowel sounds present. No organomegaly or mass.  EXTREMITIES: No pedal edema, cyanosis, or clubbing.  NEUROLOGIC: Cranial nerves II through XII are intact. Muscle strength 5/5 in all extremities. Sensation intact. Gait not checked.  PSYCHIATRIC: The patient is alert and oriented x 3.  SKIN: No obvious rash, lesion, or ulcer.   DATA REVIEW:   CBC  Recent Labs Lab 06/27/17 0602  WBC 5.6  HGB 11.2*  HCT 33.6*  PLT 148*    Chemistries   Recent Labs Lab 06/27/17 0602  06/29/17 0258  NA 137  < > 138  K 5.6*  < > 5.0  CL 107  < > 106   CO2 26  < > 26  GLUCOSE 274*  < > 276*  BUN 32*  < > 34*  CREATININE 1.69*  < > 1.70*  CALCIUM 8.4*  < > 8.5*  AST 34  --   --   ALT 25  --   --  ALKPHOS 132*  --   --   BILITOT 0.4  --   --   < > = values in this interval not displayed.  Cardiac Enzymes No results for input(s): TROPONINI in the last 168 hours.  Microbiology Results  Results for orders placed or performed during the hospital encounter of 06/22/17  C difficile quick scan w PCR reflex     Status: None   Collection Time: 06/23/17 12:15 PM  Result Value Ref Range Status   C Diff antigen NEGATIVE NEGATIVE Final   C Diff toxin NEGATIVE NEGATIVE Final   C Diff interpretation No C. difficile detected.  Final  Gastrointestinal Panel by PCR , Stool     Status: None   Collection Time: 06/23/17 12:15 PM  Result Value Ref Range Status   Campylobacter species NOT DETECTED NOT DETECTED Final   Plesimonas shigelloides NOT DETECTED NOT DETECTED Final   Salmonella species NOT DETECTED NOT DETECTED Final   Yersinia enterocolitica NOT DETECTED NOT DETECTED Final   Vibrio species NOT DETECTED NOT DETECTED Final   Vibrio cholerae NOT DETECTED NOT DETECTED Final   Enteroaggregative E coli (EAEC) NOT DETECTED NOT DETECTED Final   Enteropathogenic E coli (EPEC) NOT DETECTED NOT DETECTED Final   Enterotoxigenic E coli (ETEC) NOT DETECTED NOT DETECTED Final   Shiga like toxin producing E coli (STEC) NOT DETECTED NOT DETECTED Final   Shigella/Enteroinvasive E coli (EIEC) NOT DETECTED NOT DETECTED Final   Cryptosporidium NOT DETECTED NOT DETECTED Final   Cyclospora cayetanensis NOT DETECTED NOT DETECTED Final   Entamoeba histolytica NOT DETECTED NOT DETECTED Final   Giardia lamblia NOT DETECTED NOT DETECTED Final   Adenovirus F40/41 NOT DETECTED NOT DETECTED Final   Astrovirus NOT DETECTED NOT DETECTED Final   Norovirus GI/GII NOT DETECTED NOT DETECTED Final   Rotavirus A NOT DETECTED NOT DETECTED Final   Sapovirus (I, II, IV, and  V) NOT DETECTED NOT DETECTED Final    RADIOLOGY:  US Renal  Result Date: 06/29/2017 CLINICAL DATA:  Acute renal failure EXAM: RENAL / URINARY TRACT ULTRASOUND COMPLETE COMPARISON:  None. FINDINGS: Right Kidney: Length: 9.5 cm. Echogenicity within normal limits. No mass or hydronephrosis visualized. Left Kidney: Length: 9.5 cm. Echogenicity within normal limits. 1.7 x 1.6 x 1.8 cm anechoic lower pole renal mass without internal Doppler flow most consistent with a cyst. No solid mass or hydronephrosis visualized. Bladder: Mild relative bladder wall thickening. IMPRESSION: 1. No obstructive uropathy. Electronically Signed   By: Kathreen Devoid   On: 06/29/2017 10:10    EKG:   Orders placed or performed during the hospital encounter of 06/22/17  . ED EKG  . ED EKG  . EKG 12-Lead  . EKG 12-Lead      Management plans discussed with the patient, family and they are in agreement.  CODE STATUS:     Code Status Orders        Start     Ordered   06/22/17 1736  Full code  Continuous     06/22/17 1735    Code Status History    Date Active Date Inactive Code Status Order ID Comments User Context   06/16/2017  6:49 PM 06/19/2017  6:41 PM Full Code [id number]  Vaughan Basta, MD Inpatient   05/17/2017  3:50 PM 05/20/2017  5:44 PM Full Code [id number]  Idelle Crouch, MD Inpatient   09/13/2016  5:56 PM 09/16/2016  6:56 PM Full Code [id number]  Fritzi Mandes, MD Inpatient  TOTAL TIME TAKING CARE OF THIS PATIENT: 34 minutes.    Vaughan Basta M.D on 06/29/2017 at 3:22 PM  Between 7am to 6pm - Pager - [phone]  After 6pm go to [email or web address] - password [email or web address] [facility] [facility]  Office  [phone]  CC: Primary care physician; [name], MD   Note: This dictation was prepared with Dragon dictation along with smaller phrase technology. Any transcriptional errors that result from this process are unintentional.

## 2017-06-29 NOTE — Care Management (Signed)
RNCM attempted to meet with patient but he was currently off the unit for study. RNCM will follow for home health/DME needs.

## 2017-06-29 NOTE — Progress Notes (Signed)
[facility]  ROUNDING NOTE   Subjective:   K 5  Patient walking around the room. Wants to go home.   Objective:  Vital signs in last 24 hours:  Temp:  [97.7 F (36.5 C)-98.5 F (36.9 C)] 98.5 F (36.9 C) (09/24 0522) Pulse Rate:  [73-77] 73 (09/24 0531) Resp:  [20] 20 (09/24 0522) BP: (88-194)/(49-82) 102/50 (09/24 0531) SpO2:  [96 %-100 %] 100 % (09/24 0531)  Weight change:  Filed Weights   06/22/17 1443  Weight: 77.1 kg (170 lb)    Intake/Output: I/O last 3 completed shifts: In: 1200 [P.O.:1200] Out: -    Intake/Output this shift:  No intake/output data recorded.  Physical Exam: General: NAD  Head: Normocephalic, atraumatic. Moist oral mucosal membranes  Eyes: Anicteric, PERRL  Neck: Supple, trachea midline  Lungs:  Clear to auscultation  Heart: Regular rate and rhythm  Abdomen:  Soft, nontender,   Extremities: No  peripheral edema.  Neurologic: Nonfocal, moving all four extremities  Skin: No lesions       Basic Metabolic Panel:  Recent Labs Lab 06/25/17 0518 06/26/17 0326 06/26/17 1245 06/27/17 0602 06/28/17 0644 06/28/17 1419 06/29/17 0258  NA 138 137  --  137 136  --  138  K 5.2* 5.4* 4.3 5.6* 6.2* 5.2* 5.0  CL 107 104  --  107 106  --  106  CO2 27 27  --  26 26  --  26  GLUCOSE 269* 348*  --  274* 312*  --  276*  BUN 28* 31*  --  32* 33*  --  34*  CREATININE 1.87* 1.73*  --  1.69* 1.65*  --  1.70*  CALCIUM 8.4* 8.7*  --  8.4* 8.7*  --  8.5*    Liver Function Tests:  Recent Labs Lab 06/22/17 1527 06/27/17 0602  AST 27 34  ALT 23 25  ALKPHOS 118 132*  BILITOT 0.5 0.4  PROT 6.5 6.8  ALBUMIN 3.1* 3.4*   No results for input(s): LIPASE, AMYLASE in the last 168 hours. No results for input(s): AMMONIA in the last 168 hours.  CBC:  Recent Labs Lab 06/22/17 1527 06/23/17 1028 06/27/17 0602  WBC 4.5 4.6 5.6  NEUTROABS 2.7  --   --   HGB 10.9* 10.7* 11.2*  HCT 32.8* 31.9* 33.6*  MCV 82.1 81.7 81.9  PLT 151 143*  148*    Cardiac Enzymes: No results for input(s): CKTOTAL, CKMB, CKMBINDEX, TROPONINI in the last 168 hours.  BNP: Invalid input(s): POCBNP  CBG:  Recent Labs Lab 06/28/17 1132 06/28/17 1648 06/28/17 2236 06/29/17 0745 06/29/17 1143  GLUCAP 220* 97 210* 219* 176*    Microbiology: Results for orders placed or performed during the hospital encounter of 06/22/17  C difficile quick scan w PCR reflex     Status: None   Collection Time: 06/23/17 12:15 PM  Result Value Ref Range Status   C Diff antigen NEGATIVE NEGATIVE Final   C Diff toxin NEGATIVE NEGATIVE Final   C Diff interpretation No C. difficile detected.  Final  Gastrointestinal Panel by PCR , Stool     Status: None   Collection Time: 06/23/17 12:15 PM  Result Value Ref Range Status   Campylobacter species NOT DETECTED NOT DETECTED Final   Plesimonas shigelloides NOT DETECTED NOT DETECTED Final   Salmonella species NOT DETECTED NOT DETECTED Final   Yersinia enterocolitica NOT DETECTED NOT DETECTED Final   Vibrio species NOT DETECTED NOT DETECTED Final   Vibrio cholerae  NOT DETECTED NOT DETECTED Final   Enteroaggregative E coli (EAEC) NOT DETECTED NOT DETECTED Final   Enteropathogenic E coli (EPEC) NOT DETECTED NOT DETECTED Final   Enterotoxigenic E coli (ETEC) NOT DETECTED NOT DETECTED Final   Shiga like toxin producing E coli (STEC) NOT DETECTED NOT DETECTED Final   Shigella/Enteroinvasive E coli (EIEC) NOT DETECTED NOT DETECTED Final   Cryptosporidium NOT DETECTED NOT DETECTED Final   Cyclospora cayetanensis NOT DETECTED NOT DETECTED Final   Entamoeba histolytica NOT DETECTED NOT DETECTED Final   Giardia lamblia NOT DETECTED NOT DETECTED Final   Adenovirus F40/41 NOT DETECTED NOT DETECTED Final   Astrovirus NOT DETECTED NOT DETECTED Final   Norovirus GI/GII NOT DETECTED NOT DETECTED Final   Rotavirus A NOT DETECTED NOT DETECTED Final   Sapovirus (I, II, IV, and V) NOT DETECTED NOT DETECTED Final     Coagulation Studies: No results for input(s): LABPROT, INR in the last 72 hours.  Urinalysis: No results for input(s): COLORURINE, LABSPEC, PHURINE, GLUCOSEU, HGBUR, BILIRUBINUR, KETONESUR, PROTEINUR, UROBILINOGEN, NITRITE, LEUKOCYTESUR in the last 72 hours.  Invalid input(s): APPERANCEUR    Imaging: US Renal  Result Date: 06/29/2017 CLINICAL DATA:  Acute renal failure EXAM: RENAL / URINARY TRACT ULTRASOUND COMPLETE COMPARISON:  None. FINDINGS: Right Kidney: Length: 9.5 cm. Echogenicity within normal limits. No mass or hydronephrosis visualized. Left Kidney: Length: 9.5 cm. Echogenicity within normal limits. 1.7 x 1.6 x 1.8 cm anechoic lower pole renal mass without internal Doppler flow most consistent with a cyst. No solid mass or hydronephrosis visualized. Bladder: Mild relative bladder wall thickening. IMPRESSION: 1. No obstructive uropathy. Electronically Signed   By: Kathreen Devoid   On: 06/29/2017 10:10     Medications:    . aspirin EC  81 mg Oral Daily  . atorvastatin  40 mg Oral QHS  . citalopram  20 mg Oral Daily  . fludrocortisone  0.1 mg Oral Daily  . gabapentin  100 mg Oral TID  . heparin  5,000 Units Subcutaneous Q8H  . insulin aspart  0-5 Units Subcutaneous QHS  . insulin aspart  0-9 Units Subcutaneous TID WC  . insulin aspart  3 Units Subcutaneous TID AC  . insulin glargine  12 Units Subcutaneous QHS  . levothyroxine  200 mcg Oral QAC breakfast  . lipase/protease/amylase  36,000 Units Oral BID BM  . lipase/protease/amylase  72,000 Units Oral TID WC  . midodrine  5 mg Oral TID WC  . pantoprazole  40 mg Oral Daily  . patiromer  16.8 g Oral Daily  . protein supplement  8 oz Oral BID BM   acetaminophen, HYDROcodone-acetaminophen, hydrOXYzine, loperamide, polyvinyl alcohol  Assessment/ Plan:  [name] is a 66 y.o. black male with Left eye blindness, diabetes mellitus type 2, hypertension, syncope, and chronic kidney disease, who was admitted to [facility]  on 06/22/2017 for evaluation of lightheadedness and dizziness.   1. Acute renal failure with hyperkalemia on chronic kidney disease stage III Baseline cranial 1.68, GFR of 47 on 8/14. No proteinuria Chronic kidney disease secondary to diabetic nephropathy and hypertension Acute renal failure from prerenal azotemia.  - Pending SPEP/UPEP - renal ultrasound reviewed.   - Holding home BP medications. Not currently on an ACE-I/ARB - [place] as inpatient. Needs low potassium diet. Will need full adrenal work up as outpatient.   2. Hypertension: orthostatic on admission - Ordered fludrocortisone and midodrine - Concern for autonomic neuropathy  3. Diabetes mellitus type II with chronic kidney disease: insulin dependent. Hemoglobin  A1c 10.9%. Not at goal.   Needs Outpatient follow up with Nephrology.    LOS: [place], Kolbey Teichert 9/24/201812:45 PM

## 2017-06-29 NOTE — Progress Notes (Signed)
Discharge instructions along with home medications and follow up gone over with patient. He verbalized that he understood instructions. Prescriptions given to patient. IV removed. Pt being discharged home on room air, no distress noted. Ammie Dalton, RN

## 2017-06-30 LAB — PROTEIN ELECTROPHORESIS, SERUM
A/G Ratio: 1 (ref 0.7–1.7)
ALBUMIN ELP: 3 g/dL (ref 2.9–4.4)
ALPHA-1-GLOBULIN: 0.2 g/dL (ref 0.0–0.4)
Alpha-2-Globulin: 0.5 g/dL (ref 0.4–1.0)
Beta Globulin: 0.8 g/dL (ref 0.7–1.3)
GAMMA GLOBULIN: 1.5 g/dL (ref 0.4–1.8)
GLOBULIN, TOTAL: 3 g/dL (ref 2.2–3.9)
TOTAL PROTEIN ELP: 6 g/dL (ref 6.0–8.5)

## 2017-06-30 LAB — MISC LABCORP TEST (SEND OUT): LABCORP TEST CODE: 1354

## 2017-07-01 LAB — VASOACTIVE INTESTINAL PEPTIDE (VIP): Vasoactive Intest Polypeptide: 93 pg/mL — ABNORMAL HIGH (ref 0.0–58.8)

## 2017-12-09 DIAGNOSIS — N179 Acute kidney failure, unspecified: Secondary | ICD-10-CM | POA: Insufficient documentation

## 2018-03-18 ENCOUNTER — Other Ambulatory Visit: Payer: Self-pay | Admitting: Nurse Practitioner

## 2018-03-18 DIAGNOSIS — R1310 Dysphagia, unspecified: Secondary | ICD-10-CM

## 2018-03-25 ENCOUNTER — Ambulatory Visit: Admission: RE | Admit: 2018-03-25 | Payer: Medicare Other | Source: Ambulatory Visit

## 2018-07-07 DIAGNOSIS — G903 Multi-system degeneration of the autonomic nervous system: Secondary | ICD-10-CM | POA: Insufficient documentation

## 2018-07-07 DIAGNOSIS — I951 Orthostatic hypotension: Secondary | ICD-10-CM | POA: Insufficient documentation

## 2018-07-21 DIAGNOSIS — L089 Local infection of the skin and subcutaneous tissue, unspecified: Secondary | ICD-10-CM | POA: Insufficient documentation

## 2018-07-21 DIAGNOSIS — S41101A Unspecified open wound of right upper arm, initial encounter: Secondary | ICD-10-CM | POA: Insufficient documentation

## 2018-11-02 ENCOUNTER — Other Ambulatory Visit
Admission: RE | Admit: 2018-11-02 | Discharge: 2018-11-02 | Disposition: A | Discharge status: Home / Self Care | Payer: Medicare Other | Source: Ambulatory Visit | Attending: Nurse Practitioner | Admitting: Nurse Practitioner

## 2018-11-02 DIAGNOSIS — K529 Noninfective gastroenteritis and colitis, unspecified: Secondary | ICD-10-CM | POA: Insufficient documentation

## 2018-11-02 LAB — GASTROINTESTINAL PANEL BY PCR, STOOL (REPLACES STOOL CULTURE)

## 2018-11-02 LAB — C DIFFICILE QUICK SCREEN W PCR REFLEX
C Diff antigen: NEGATIVE
C Diff interpretation: NOT DETECTED
C Diff toxin: NEGATIVE

## 2018-11-04 LAB — PANCREATIC ELASTASE, FECAL: Pancreatic Elastase-1, Stool: 153 ug Elast./g — ABNORMAL LOW (ref >200)

## 2018-12-04 ENCOUNTER — Inpatient Hospital Stay: Payer: Medicare Other

## 2018-12-04 ENCOUNTER — Emergency Department: Payer: Medicare Other

## 2018-12-04 ENCOUNTER — Other Ambulatory Visit: Payer: Self-pay

## 2018-12-04 ENCOUNTER — Inpatient Hospital Stay
Admission: EM | Admit: 2018-12-04 | Discharge: 2018-12-08 | DRG: 682 | Disposition: A | Discharge status: Home Health | Payer: Medicare Other | Attending: Internal Medicine | Admitting: Internal Medicine

## 2018-12-04 DIAGNOSIS — Z794 Long term (current) use of insulin: Secondary | ICD-10-CM

## 2018-12-04 DIAGNOSIS — R7989 Other specified abnormal findings of blood chemistry: Secondary | ICD-10-CM

## 2018-12-04 DIAGNOSIS — E11319 Type 2 diabetes mellitus with unspecified diabetic retinopathy without macular edema: Secondary | ICD-10-CM | POA: Diagnosis present

## 2018-12-04 DIAGNOSIS — I429 Cardiomyopathy, unspecified: Secondary | ICD-10-CM | POA: Diagnosis present

## 2018-12-04 DIAGNOSIS — I951 Orthostatic hypotension: Secondary | ICD-10-CM | POA: Diagnosis not present

## 2018-12-04 DIAGNOSIS — N184 Chronic kidney disease, stage 4 (severe): Secondary | ICD-10-CM | POA: Diagnosis present

## 2018-12-04 DIAGNOSIS — D638 Anemia in other chronic diseases classified elsewhere: Secondary | ICD-10-CM | POA: Diagnosis present

## 2018-12-04 DIAGNOSIS — R29703 NIHSS score 3: Secondary | ICD-10-CM | POA: Diagnosis present

## 2018-12-04 DIAGNOSIS — I639 Cerebral infarction, unspecified: Secondary | ICD-10-CM | POA: Diagnosis present

## 2018-12-04 DIAGNOSIS — R531 Weakness: Secondary | ICD-10-CM | POA: Diagnosis not present

## 2018-12-04 DIAGNOSIS — R29704 NIHSS score 4: Secondary | ICD-10-CM | POA: Diagnosis not present

## 2018-12-04 DIAGNOSIS — N179 Acute kidney failure, unspecified: Secondary | ICD-10-CM | POA: Diagnosis not present

## 2018-12-04 DIAGNOSIS — E039 Hypothyroidism, unspecified: Secondary | ICD-10-CM | POA: Diagnosis present

## 2018-12-04 DIAGNOSIS — F419 Anxiety disorder, unspecified: Secondary | ICD-10-CM | POA: Diagnosis present

## 2018-12-04 DIAGNOSIS — R29810 Facial weakness: Secondary | ICD-10-CM | POA: Diagnosis present

## 2018-12-04 DIAGNOSIS — G459 Transient cerebral ischemic attack, unspecified: Secondary | ICD-10-CM | POA: Diagnosis present

## 2018-12-04 DIAGNOSIS — R778 Other specified abnormalities of plasma proteins: Secondary | ICD-10-CM

## 2018-12-04 DIAGNOSIS — E0580 Other thyrotoxicosis without thyrotoxic crisis or storm: Secondary | ICD-10-CM | POA: Diagnosis present

## 2018-12-04 DIAGNOSIS — Z89421 Acquired absence of other right toe(s): Secondary | ICD-10-CM

## 2018-12-04 DIAGNOSIS — F411 Generalized anxiety disorder: Secondary | ICD-10-CM | POA: Diagnosis present

## 2018-12-04 DIAGNOSIS — G8194 Hemiplegia, unspecified affecting left nondominant side: Secondary | ICD-10-CM | POA: Diagnosis present

## 2018-12-04 DIAGNOSIS — Z841 Family history of disorders of kidney and ureter: Secondary | ICD-10-CM

## 2018-12-04 DIAGNOSIS — I452 Bifascicular block: Secondary | ICD-10-CM | POA: Diagnosis present

## 2018-12-04 DIAGNOSIS — D689 Coagulation defect, unspecified: Secondary | ICD-10-CM | POA: Diagnosis present

## 2018-12-04 DIAGNOSIS — E872 Acidosis: Secondary | ICD-10-CM | POA: Diagnosis present

## 2018-12-04 DIAGNOSIS — I5021 Acute systolic (congestive) heart failure: Secondary | ICD-10-CM | POA: Diagnosis not present

## 2018-12-04 DIAGNOSIS — T381X5A Adverse effect of thyroid hormones and substitutes, initial encounter: Secondary | ICD-10-CM | POA: Diagnosis present

## 2018-12-04 DIAGNOSIS — Z79890 Hormone replacement therapy (postmenopausal): Secondary | ICD-10-CM

## 2018-12-04 DIAGNOSIS — R197 Diarrhea, unspecified: Secondary | ICD-10-CM | POA: Diagnosis present

## 2018-12-04 DIAGNOSIS — E785 Hyperlipidemia, unspecified: Secondary | ICD-10-CM | POA: Diagnosis present

## 2018-12-04 DIAGNOSIS — Z79899 Other long term (current) drug therapy: Secondary | ICD-10-CM

## 2018-12-04 DIAGNOSIS — Z7982 Long term (current) use of aspirin: Secondary | ICD-10-CM

## 2018-12-04 DIAGNOSIS — E1122 Type 2 diabetes mellitus with diabetic chronic kidney disease: Secondary | ICD-10-CM | POA: Diagnosis present

## 2018-12-04 DIAGNOSIS — N289 Disorder of kidney and ureter, unspecified: Secondary | ICD-10-CM

## 2018-12-04 DIAGNOSIS — I248 Other forms of acute ischemic heart disease: Secondary | ICD-10-CM | POA: Diagnosis present

## 2018-12-04 DIAGNOSIS — D472 Monoclonal gammopathy: Secondary | ICD-10-CM | POA: Diagnosis present

## 2018-12-04 DIAGNOSIS — Z888 Allergy status to other drugs, medicaments and biological substances status: Secondary | ICD-10-CM

## 2018-12-04 DIAGNOSIS — I77810 Thoracic aortic ectasia: Secondary | ICD-10-CM | POA: Diagnosis present

## 2018-12-04 DIAGNOSIS — Z8673 Personal history of transient ischemic attack (TIA), and cerebral infarction without residual deficits: Secondary | ICD-10-CM

## 2018-12-04 DIAGNOSIS — I130 Hypertensive heart and chronic kidney disease with heart failure and stage 1 through stage 4 chronic kidney disease, or unspecified chronic kidney disease: Secondary | ICD-10-CM | POA: Diagnosis present

## 2018-12-04 DIAGNOSIS — Z7952 Long term (current) use of systemic steroids: Secondary | ICD-10-CM

## 2018-12-04 DIAGNOSIS — Z970 Presence of artificial eye: Secondary | ICD-10-CM

## 2018-12-04 DIAGNOSIS — Z87891 Personal history of nicotine dependence: Secondary | ICD-10-CM

## 2018-12-04 DIAGNOSIS — Z8249 Family history of ischemic heart disease and other diseases of the circulatory system: Secondary | ICD-10-CM

## 2018-12-04 DIAGNOSIS — I5043 Acute on chronic combined systolic (congestive) and diastolic (congestive) heart failure: Secondary | ICD-10-CM | POA: Diagnosis present

## 2018-12-04 DIAGNOSIS — I361 Nonrheumatic tricuspid (valve) insufficiency: Secondary | ICD-10-CM | POA: Diagnosis not present

## 2018-12-04 DIAGNOSIS — M4802 Spinal stenosis, cervical region: Secondary | ICD-10-CM | POA: Diagnosis present

## 2018-12-04 DIAGNOSIS — R4701 Aphasia: Secondary | ICD-10-CM | POA: Diagnosis not present

## 2018-12-04 DIAGNOSIS — I340 Nonrheumatic mitral (valve) insufficiency: Secondary | ICD-10-CM | POA: Diagnosis not present

## 2018-12-04 DIAGNOSIS — R4182 Altered mental status, unspecified: Secondary | ICD-10-CM | POA: Diagnosis not present

## 2018-12-04 LAB — URINE DRUG SCREEN, QUALITATIVE (ARMC ONLY)
Amphetamines, Ur Screen: NOT DETECTED
BARBITURATES, UR SCREEN: NOT DETECTED
Benzodiazepine, Ur Scrn: NOT DETECTED
Cannabinoid 50 Ng, Ur ~~LOC~~: NOT DETECTED
Cocaine Metabolite,Ur ~~LOC~~: NOT DETECTED
MDMA (Ecstasy)Ur Screen: NOT DETECTED
Methadone Scn, Ur: NOT DETECTED
Opiate, Ur Screen: NOT DETECTED
Phencyclidine (PCP) Ur S: NOT DETECTED
Tricyclic, Ur Screen: NOT DETECTED

## 2018-12-04 LAB — APTT: aPTT: 33 seconds (ref 24–36)

## 2018-12-04 LAB — RETICULOCYTES
Immature Retic Fract: 5 % (ref 2.3–15.9)
RBC.: 4.2 MIL/uL — ABNORMAL LOW (ref 4.22–5.81)
Retic Count, Absolute: 21.4 10*3/uL (ref 19.0–186.0)
Retic Ct Pct: 0.5 % (ref 0.4–3.1)

## 2018-12-04 LAB — CBC WITH DIFFERENTIAL/PLATELET
Abs Immature Granulocytes: 0.01 10*3/uL (ref 0.00–0.07)
Basophils Absolute: 0 10*3/uL (ref 0.0–0.1)
Basophils Relative: 1 %
Eosinophils Absolute: 0.3 10*3/uL (ref 0.0–0.5)
Eosinophils Relative: 7 %
HCT: 35.4 % — ABNORMAL LOW (ref 39.0–52.0)
Hemoglobin: 11.6 g/dL — ABNORMAL LOW (ref 13.0–17.0)
Immature Granulocytes: 0 %
Lymphocytes Relative: 24 %
Lymphs Abs: 1 10*3/uL (ref 0.7–4.0)
MCH: 24.8 pg — AB (ref 26.0–34.0)
MCHC: 32.8 g/dL (ref 30.0–36.0)
MCV: 75.6 fL — ABNORMAL LOW (ref 80.0–100.0)
Monocytes Absolute: 0.3 10*3/uL (ref 0.1–1.0)
Monocytes Relative: 8 %
Neutro Abs: 2.6 10*3/uL (ref 1.7–7.7)
Neutrophils Relative %: 60 %
Platelets: 118 10*3/uL — ABNORMAL LOW (ref 150–400)
RBC: 4.68 MIL/uL (ref 4.22–5.81)
RDW: 17.3 % — ABNORMAL HIGH (ref 11.5–15.5)
WBC: 4.3 10*3/uL (ref 4.0–10.5)
nRBC: 0 % (ref 0.0–0.2)

## 2018-12-04 LAB — COMPREHENSIVE METABOLIC PANEL
ALT: 52 U/L — ABNORMAL HIGH (ref 0–44)
AST: 31 U/L (ref 15–41)
Albumin: 3.7 g/dL (ref 3.5–5.0)
Alkaline Phosphatase: 93 U/L (ref 38–126)
Anion gap: 6 (ref 5–15)
BUN: 47 mg/dL — ABNORMAL HIGH (ref 8–23)
CO2: 18 mmol/L — ABNORMAL LOW (ref 22–32)
Calcium: 8.9 mg/dL (ref 8.9–10.3)
Chloride: 114 mmol/L — ABNORMAL HIGH (ref 98–111)
Creatinine, Ser: 2.86 mg/dL — ABNORMAL HIGH (ref 0.61–1.24)
GFR calc non Af Amer: 22 mL/min — ABNORMAL LOW (ref >60)
GFR, EST AFRICAN AMERICAN: 25 mL/min — AB (ref >60)
Glucose, Bld: 121 mg/dL — ABNORMAL HIGH (ref 70–99)
Potassium: 4.8 mmol/L (ref 3.5–5.1)
Sodium: 138 mmol/L (ref 135–145)
Total Bilirubin: 0.7 mg/dL (ref 0.3–1.2)
Total Protein: 7 g/dL (ref 6.5–8.1)

## 2018-12-04 LAB — TSH: TSH: 0.018 u[IU]/mL — ABNORMAL LOW (ref 0.350–4.500)

## 2018-12-04 LAB — URINALYSIS, ROUTINE W REFLEX MICROSCOPIC
Bacteria, UA: NONE SEEN
Bilirubin Urine: NEGATIVE
Glucose, UA: NEGATIVE mg/dL
Ketones, ur: NEGATIVE mg/dL
Leukocytes,Ua: NEGATIVE
Nitrite: NEGATIVE
Protein, ur: 100 mg/dL — AB
Specific Gravity, Urine: 1.012 (ref 1.005–1.030)
pH: 5 (ref 5.0–8.0)

## 2018-12-04 LAB — PROTIME-INR
INR: 1.1 (ref 0.8–1.2)
Prothrombin Time: 14 seconds (ref 11.4–15.2)

## 2018-12-04 LAB — PROTEIN, URINE, RANDOM: Total Protein, Urine: 211 mg/dL

## 2018-12-04 LAB — FERRITIN: FERRITIN: 235 ng/mL (ref 24–336)

## 2018-12-04 LAB — GLUCOSE, CAPILLARY: Glucose-Capillary: 91 mg/dL (ref 70–99)

## 2018-12-04 LAB — NA AND K (SODIUM & POTASSIUM), RAND UR
Potassium Urine: 13 mmol/L
Sodium, Ur: 43 mmol/L

## 2018-12-04 LAB — HEPARIN LEVEL (UNFRACTIONATED): Heparin Unfractionated: 0.52 IU/mL (ref 0.30–0.70)

## 2018-12-04 LAB — CREATININE, URINE, RANDOM: Creatinine, Urine: 158 mg/dL

## 2018-12-04 LAB — IRON AND TIBC
Iron: 50 ug/dL (ref 45–182)
Saturation Ratios: 25 % (ref 17.9–39.5)
TIBC: 198 ug/dL — ABNORMAL LOW (ref 250–450)
UIBC: 148 ug/dL

## 2018-12-04 LAB — TROPONIN I
Troponin I: 0.51 ng/mL (ref <0.03)
Troponin I: 0.49 ng/mL (ref <0.03)
Troponin I: 0.52 ng/mL (ref <0.03)

## 2018-12-04 LAB — PHOSPHORUS: Phosphorus: 4.1 mg/dL (ref 2.5–4.6)

## 2018-12-04 LAB — ETHANOL

## 2018-12-04 LAB — MAGNESIUM: Magnesium: 1.4 mg/dL — ABNORMAL LOW (ref 1.7–2.4)

## 2018-12-04 LAB — TRANSFERRIN: Transferrin: 138 mg/dL — ABNORMAL LOW (ref 180–329)

## 2018-12-04 MED ORDER — HEPARIN (PORCINE) 25000 UT/250ML-% IV SOLN
750.0000 [IU]/h | INTRAVENOUS | Status: DC
  Administered 2018-12-04 – 2018-12-05 (×3): 750 [IU]/h via INTRAVENOUS
  Filled 2018-12-04 (×3): qty 250

## 2018-12-04 MED ORDER — HEPARIN BOLUS VIA INFUSION
3900.0000 [IU] | Freq: Once | INTRAVENOUS | Status: AC
  Administered 2018-12-04: 3900 [IU] via INTRAVENOUS
  Filled 2018-12-04: qty 3900

## 2018-12-04 MED ORDER — ASPIRIN EC 81 MG PO TBEC: 81.0000 mg | DELAYED_RELEASE_TABLET | Freq: Every day | ORAL | Status: DC

## 2018-12-04 MED ORDER — SODIUM CHLORIDE 0.9 % IV SOLN: 250.0000 mL | INTRAVENOUS | Status: DC | PRN

## 2018-12-04 MED ORDER — FLUDROCORTISONE ACETATE 0.1 MG PO TABS
0.1000 mg | ORAL_TABLET | Freq: Every day | ORAL | Status: DC
  Filled 2018-12-04: qty 1

## 2018-12-04 MED ORDER — FUROSEMIDE 40 MG PO TABS
80.0000 mg | ORAL_TABLET | Freq: Every day | ORAL | Status: DC
  Administered 2018-12-04 – 2018-12-08 (×5): 80 mg via ORAL
  Filled 2018-12-04 (×4): qty 2
  Filled 2018-12-04: qty 4

## 2018-12-04 MED ORDER — AMLODIPINE BESYLATE 5 MG PO TABS
5.0000 mg | ORAL_TABLET | Freq: Every day | ORAL | Status: DC
  Administered 2018-12-04 – 2018-12-07 (×4): 5 mg via ORAL
  Filled 2018-12-04 (×4): qty 1

## 2018-12-04 MED ORDER — ACETAMINOPHEN 325 MG PO TABS: 650.0000 mg | ORAL_TABLET | ORAL | Status: DC | PRN

## 2018-12-04 MED ORDER — POLYVINYL ALCOHOL 1.4 % OP SOLN
1.0000 [drp] | Freq: Three times a day (TID) | OPHTHALMIC | Status: DC | PRN
  Filled 2018-12-04: qty 15

## 2018-12-04 MED ORDER — STROKE: EARLY STAGES OF RECOVERY BOOK
Freq: Once | Status: AC
  Administered 2018-12-05: 16:00:00

## 2018-12-04 MED ORDER — VITAMIN D 25 MCG (1000 UNIT) PO TABS
1000.0000 [IU] | ORAL_TABLET | Freq: Every day | ORAL | Status: DC
  Administered 2018-12-04: 1000 [IU] via ORAL
  Filled 2018-12-04 (×4): qty 1

## 2018-12-04 MED ORDER — SODIUM CHLORIDE 0.9% FLUSH
3.0000 mL | Freq: Two times a day (BID) | INTRAVENOUS | Status: DC
  Administered 2018-12-04 – 2018-12-08 (×8): 3 mL via INTRAVENOUS

## 2018-12-04 MED ORDER — NITROGLYCERIN 0.4 MG SL SUBL: 0.4000 mg | SUBLINGUAL_TABLET | SUBLINGUAL | Status: DC | PRN

## 2018-12-04 MED ORDER — PANCRELIPASE (LIP-PROT-AMYL) 12000-38000 UNITS PO CPEP
72000.0000 [IU] | ORAL_CAPSULE | Freq: Three times a day (TID) | ORAL | Status: DC
  Administered 2018-12-05 – 2018-12-08 (×8): 72000 [IU] via ORAL
  Filled 2018-12-04 (×11): qty 6

## 2018-12-04 MED ORDER — ONDANSETRON HCL 4 MG/2ML IJ SOLN
4.0000 mg | Freq: Four times a day (QID) | INTRAMUSCULAR | Status: DC | PRN
  Administered 2018-12-05: 4 mg via INTRAVENOUS
  Filled 2018-12-04: qty 2

## 2018-12-04 MED ORDER — HEPARIN (PORCINE) 25000 UT/250ML-% IV SOLN: 10.0000 [IU]/kg/h | INTRAVENOUS | Status: DC

## 2018-12-04 MED ORDER — ATORVASTATIN CALCIUM 20 MG PO TABS
40.0000 mg | ORAL_TABLET | Freq: Every day | ORAL | Status: DC
  Administered 2018-12-04 – 2018-12-07 (×3): 40 mg via ORAL
  Filled 2018-12-04 (×3): qty 2

## 2018-12-04 MED ORDER — LOPERAMIDE HCL 2 MG PO CAPS: 2.0000 mg | ORAL_CAPSULE | Freq: Four times a day (QID) | ORAL | Status: DC | PRN

## 2018-12-04 MED ORDER — HYDROXYZINE HCL 10 MG PO TABS
10.0000 mg | ORAL_TABLET | Freq: Three times a day (TID) | ORAL | Status: DC | PRN
  Filled 2018-12-04: qty 1

## 2018-12-04 MED ORDER — INSULIN ASPART 100 UNIT/ML ~~LOC~~ SOLN: 5.0000 [IU] | Freq: Three times a day (TID) | SUBCUTANEOUS | Status: DC

## 2018-12-04 MED ORDER — CITALOPRAM HYDROBROMIDE 20 MG PO TABS
20.0000 mg | ORAL_TABLET | Freq: Every day | ORAL | Status: DC
  Administered 2018-12-04 – 2018-12-08 (×5): 20 mg via ORAL
  Filled 2018-12-04 (×5): qty 1

## 2018-12-04 MED ORDER — ASPIRIN 81 MG PO CHEW
81.0000 mg | CHEWABLE_TABLET | Freq: Every day | ORAL | Status: DC
  Administered 2018-12-05 – 2018-12-08 (×4): 81 mg via ORAL
  Filled 2018-12-04 (×4): qty 1

## 2018-12-04 MED ORDER — GLIPIZIDE 5 MG PO TABS
2.5000 mg | ORAL_TABLET | Freq: Every day | ORAL | Status: DC
  Administered 2018-12-05 – 2018-12-06 (×2): 2.5 mg via ORAL
  Filled 2018-12-04 (×2): qty 0.5

## 2018-12-04 MED ORDER — SODIUM CHLORIDE 0.9% FLUSH: 3.0000 mL | INTRAVENOUS | Status: DC | PRN

## 2018-12-04 MED ORDER — ALPRAZOLAM 0.25 MG PO TABS: 0.2500 mg | ORAL_TABLET | Freq: Two times a day (BID) | ORAL | Status: DC | PRN

## 2018-12-04 MED ORDER — ASPIRIN 81 MG PO CHEW
325.0000 mg | CHEWABLE_TABLET | Freq: Once | ORAL | Status: AC
  Administered 2018-12-04: 325 mg via ORAL
  Filled 2018-12-04: qty 5

## 2018-12-04 MED ORDER — CHOLESTYRAMINE LIGHT 4 G PO PACK
4.0000 g | PACK | Freq: Two times a day (BID) | ORAL | Status: DC
  Filled 2018-12-04 (×2): qty 1

## 2018-12-04 MED ORDER — HEPARIN SODIUM (PORCINE) 5000 UNIT/ML IJ SOLN: 60.0000 [IU]/kg | Freq: Once | INTRAMUSCULAR | Status: DC

## 2018-12-04 MED ORDER — LEVOTHYROXINE SODIUM 100 MCG PO TABS: 200.0000 ug | ORAL_TABLET | Freq: Every day | ORAL | Status: DC

## 2018-12-04 NOTE — ED Notes (Signed)
Pt's daughter arrived to bed, patient's daughter given update with permission from this RN.

## 2018-12-04 NOTE — ED Triage Notes (Signed)
Pt arrived from home via EMS with complaints of stroke-like symptoms. Pt is alert and oriented x 4. Pt has left sided weakness and facial droop. Pt states he has also been dealing with some nerve issues in his neck and back so its hard for him to tell if the weakness is from nerves or a possible stroke. Ems states that grips were equal and that pt stated that his speech was a little more slurred than usual. Pt has Hx of diabetes. VS per EMS [id number]. EMS states that this happened around 6:00pm last night but symptoms resolved about 15 min later and then around 2:00am today the symptoms returned.

## 2018-12-04 NOTE — Consult Note (Signed)
Referring Physician: Salary    Chief Complaint: Left sided weakness  HPI: Cameron Adams is an 68 y.o. male with multiple medical problems who gives a very disjointed history.  It appears that he has been going downhill somewhat since he stopped dialysis about 2 weeks ago.  He reports that his left sided is intermittently weak and it at times gives him trouble with gait and overall functional ability.  Yesterday at about 6pm he was noted to have some left sided weakness, slurred speech and facial droop.  It is unclear if he has returned to baseline since that time but he has fluctuated since that time.  Also reports some chest pain.  With no improvement patient had a fall this morning and was brought in for evaluation.  Initial NIHSS of 2  Date last known well: Unable to determine Time last known well: Unable to determine tPA Given: No: Unable to determine LKW  Past Medical History:  Diagnosis Date  . Anemia of chronic disease   . Blindness of left eye   . Chronic kidney disease, stage III (moderate) (HCC)   . Diabetes ([place])   . Diarrhea   . Diastolic dysfunction    a. 06/2017 Echo: EF 55-60%, no rwma, Gr2 DD, Asc Ao 3.9cm, nl RV fxn, nl PASP.  Marland Kitchen Hypertension   . Hypothyroidism   . Monoclonal gammopathy of unknown significance (MGUS)    a. prev seen @ [facility].  Lost to f/u - never had BM bx.  . Orthostatic hypotension   . Syncope and collapse     Past Surgical History:  Procedure Laterality Date  . 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 PERIPHERAL VASCULAR CATHETERIZATION N/A 09/15/2016   Procedure: Lower Extremity Angiography;  Surgeon: Algernon Huxley, MD;  Location: [facility];  Service: Cardiovascular;  Laterality: N/A;  . THYROID SURGERY      Family History  Problem Relation Age of Onset  . Kidney failure Mother   . Heart disease Father   . Kidney  failure Father   . Heart disease Brother    Social History:  reports that he quit smoking about 40 years ago. His smoking use included cigarettes. He has a 2.50 pack-year smoking history. He has never used smokeless tobacco. He reports that he does not drink alcohol or use drugs.  Allergies:  Allergies  Allergen Reactions  . Gabapentin     Other reaction(s): Other (See Comments) Trouble sleeping     Medications: I have reviewed the patient's current medications. Prior to Admission medications   Medication Sig Start Date End Date Taking? Authorizing Provider  aspirin EC 81 MG tablet Take 81 mg by mouth daily.   Yes [provider]  atorvastatin (LIPITOR) 40 MG tablet Take 40 mg by mouth at bedtime.   Yes [provider]  Cholecalciferol (VITAMIN D-1000 MAX ST) 25 MCG (1000 UT) tablet Take 1,000 Units by mouth daily.    Yes [provider]  citalopram (CELEXA) 20 MG tablet Take 20 mg by mouth daily.   Yes [provider]  fludrocortisone (FLORINEF) 0.1 MG tablet Take 1 tablet (0.1 mg total) by mouth daily. 06/30/17  Yes Vaughan Basta, MD  glipiZIDE (GLUCOTROL) 5 MG tablet Take 2.5 mg by mouth daily before breakfast.  11/26/18  Yes [provider]  hydroxypropyl methylcellulose / hypromellose (ISOPTO TEARS / GONIOVISC) 2.5 %  ophthalmic solution Place 1 drop into both eyes 3 (three) times daily as needed for dry eyes.   Yes [provider]  hydrOXYzine (ATARAX/VISTARIL) 10 MG tablet Take 10 mg by mouth every 8 (eight) hours as needed for itching.    Yes [provider]  levothyroxine (SYNTHROID, LEVOTHROID) 200 MCG tablet Take 1 tablet (200 mcg total) by mouth daily before breakfast. 05/21/17  Yes Vaughan Basta, MD  lipase/protease/amylase (CREON) 36000 UNITS CPEP capsule Take 2 capsules (72,000 Units total) by mouth 3 (three) times daily with meals. 06/29/17  Yes Vaughan Basta, MD  lipase/protease/amylase  (CREON) 36000 UNITS CPEP capsule Take 1 capsule (36,000 Units total) by mouth 2 (two) times daily between meals. 06/29/17  Yes Vaughan Basta, MD  loperamide (IMODIUM) 2 MG capsule Take 1 capsule (2 mg total) by mouth every 6 (six) hours as needed for diarrhea or loose stools. 06/29/17  Yes Vaughan Basta, MD  amLODipine (NORVASC) 5 MG tablet  09/15/18   [provider]  furosemide (LASIX) 80 MG tablet Take 80 mg by mouth.  09/22/18   [provider]  gabapentin (NEURONTIN) 100 MG capsule TAKE 1-3 CAPSULES BY MOUTH BEFORE BEDTIME FOR ARM PAIN 01/14/17   [provider]  insulin aspart (NOVOLOG) 100 UNIT/ML injection Inject 5 Units into the skin 3 (three) times daily before meals.    [provider]  LEVEMIR FLEXTOUCH 100 UNIT/ML Pen  11/01/18   [provider]  PREVALITE 4 g packet  09/17/18   [provider]     ROS: History obtained from the patient  General ROS: negative for - chills, fatigue, fever, night sweats, weight gain or weight loss Psychological ROS: negative for - behavioral disorder, hallucinations, memory difficulties, mood swings or suicidal ideation Ophthalmic ROS: prosthetic left eye ENT ROS: negative for - epistaxis, nasal discharge, oral lesions, sore throat, tinnitus or vertigo Allergy and Immunology ROS: negative for - hives or itchy/watery eyes Hematological and Lymphatic ROS: negative for - bleeding problems, bruising or swollen lymph nodes Endocrine ROS: negative for - galactorrhea, hair pattern changes, polydipsia/polyuria or temperature intolerance Respiratory ROS: negative for - cough, hemoptysis, shortness of breath or wheezing Cardiovascular ROS: chest pain Gastrointestinal ROS: diarrhea Genito-Urinary ROS: negative for - dysuria, hematuria, incontinence or urinary frequency/urgency Musculoskeletal ROS: negative for - joint swelling or muscular weakness Neurological ROS: as noted in  HPI Dermatological ROS: negative for rash and skin lesion changes  Physical Examination: Blood pressure 129/75, pulse 70, temperature 97.8 F (36.6 C), temperature source Oral, resp. rate 13, height 5\' 10"  (1.778 m), weight 65.8 kg, SpO2 100 %.  HEENT-  Normocephalic, no lesions, without obvious abnormality.  Prosthetic left eye.  Normal TM's bilaterally.  Normal auditory canals and external ears. Normal external nose, mucus membranes and septum.  Normal pharynx.  Edentulous Cardiovascular- S1, S2 normal, pulses palpable throughout   Lungs- chest clear, no wheezing, rales, normal symmetric air entry Abdomen- soft, non-tender; bowel sounds normal; no masses,  no organomegaly Extremities- no edema Lymph-no adenopathy palpable Musculoskeletal-no joint tenderness, deformity or swelling Skin-abrasions on left arm  Neurological Examination   Mental Status: Alert, oriented, thought content appropriate.  Speech fluent without evidence of aphasia.  Mils dysarthria noted but patient without teeth.  Able to follow 3 step commands without difficulty. Cranial Nerves: II: Right disc flat; Visual fields grossly normal, right pupil reactive to light  III,IV, VI: ptosis not present, extra-ocular motions intact bilaterally V,VII: mild left facial droop, facial light touch sensation normal  bilaterally VIII: hearing normal bilaterally IX,X: gag reflex present XI: bilateral shoulder shrug XII: midline tongue extension Motor: Right : Upper extremity   5/5    Left:     Upper extremity   5/5  Lower extremity   5/5     Lower extremity   5/5 Tone and bulk:normal tone throughout; no atrophy noted Sensory: Pinprick and light touch intact throughout, bilaterally Deep Tendon Reflexes: 1+ and symmetric with absent AJ's bilaterally Plantars: Right: mute   Left: mute Cerebellar: Normal finger-to-nose and normal heel-to-shin testing bilaterally Gait: not tested due to safety concerns    Laboratory Studies:   Basic Metabolic Panel: Recent Labs  Lab 12/04/18 0435 12/04/18 0840  NA 138  --   K 4.8  --   CL 114*  --   CO2 18*  --   GLUCOSE 121*  --   BUN 47*  --   CREATININE 2.86*  --   CALCIUM 8.9  --   MG  --  1.4*  PHOS  --  4.1    Liver Function Tests: Recent Labs  Lab 12/04/18 0435  AST 31  ALT 52*  ALKPHOS 93  BILITOT 0.7  PROT 7.0  ALBUMIN 3.7   No results for input(s): LIPASE, AMYLASE in the last 168 hours. No results for input(s): AMMONIA in the last 168 hours.  CBC: Recent Labs  Lab 12/04/18 0435  WBC 4.3  NEUTROABS 2.6  HGB 11.6*  HCT 35.4*  MCV 75.6*  PLT 118*    Cardiac Enzymes: Recent Labs  Lab 12/04/18 0435 12/04/18 0840 12/04/18 1109  TROPONINI 0.51* 0.49* 0.52*    BNP: Invalid input(s): POCBNP  CBG: Recent Labs  Lab 12/04/18 [place] 91    Microbiology: Results for orders placed or performed during the hospital encounter of 11/02/18  C difficile quick scan w PCR reflex     Status: None   Collection Time: 11/02/18 12:20 PM  Result Value Ref Range Status   C Diff antigen NEGATIVE NEGATIVE Final   C Diff toxin NEGATIVE NEGATIVE Final   C Diff interpretation No C. difficile detected.  Final    Comment: Performed at [facility], [address] [place], [place] [address]  Gastrointestinal Panel by PCR , Stool     Status: None   Collection Time: 11/02/18 12:20 PM  Result Value Ref Range Status   Campylobacter species NOT DETECTED NOT DETECTED Final   Plesimonas shigelloides NOT DETECTED NOT DETECTED Final   Salmonella species NOT DETECTED NOT DETECTED Final   Yersinia enterocolitica NOT DETECTED NOT DETECTED Final   Vibrio species NOT DETECTED NOT DETECTED Final   Vibrio cholerae NOT DETECTED NOT DETECTED Final   Enteroaggregative E coli (EAEC) NOT DETECTED NOT DETECTED Final   Enteropathogenic E coli (EPEC) NOT DETECTED NOT DETECTED Final   Enterotoxigenic E coli (ETEC) NOT DETECTED NOT DETECTED Final   Shiga like  toxin producing E coli (STEC) NOT DETECTED NOT DETECTED Final   Shigella/Enteroinvasive E coli (EIEC) NOT DETECTED NOT DETECTED Final   Cryptosporidium NOT DETECTED NOT DETECTED Final   Cyclospora cayetanensis NOT DETECTED NOT DETECTED Final   Entamoeba histolytica NOT DETECTED NOT DETECTED Final   Giardia lamblia NOT DETECTED NOT DETECTED Final   Adenovirus F40/41 NOT DETECTED NOT DETECTED Final   Astrovirus NOT DETECTED NOT DETECTED Final   Norovirus GI/GII NOT DETECTED NOT DETECTED Final   Rotavirus A NOT DETECTED NOT DETECTED Final   Sapovirus (I, II, IV, and V) NOT  DETECTED NOT DETECTED Final    Comment: Performed at [facility], [address] [place], [place] [address]    Coagulation Studies: Recent Labs    12/04/18 0435  LABPROT 14.0  INR 1.1    Urinalysis:  Recent Labs  Lab 12/04/18 0435  COLORURINE YELLOW*  LABSPEC 1.012  PHURINE 5.0  GLUCOSEU NEGATIVE  HGBUR MODERATE*  BILIRUBINUR NEGATIVE  KETONESUR NEGATIVE  PROTEINUR 100*  NITRITE NEGATIVE  LEUKOCYTESUR NEGATIVE    Lipid Panel: No results found for: CHOL, TRIG, HDL, CHOLHDL, VLDL, LDLCALC  HgbA1C:  Lab Results  Component Value Date   HGBA1C 10.9 (H) 06/24/2017    Urine Drug Screen:      Component Value Date/Time   LABOPIA NONE DETECTED 12/04/2018 0435   COCAINSCRNUR NONE DETECTED 12/04/2018 0435   LABBENZ NONE DETECTED 12/04/2018 0435   AMPHETMU NONE DETECTED 12/04/2018 0435   THCU NONE DETECTED 12/04/2018 0435   LABBARB NONE DETECTED 12/04/2018 0435    Alcohol Level:  Recent Labs  Lab 12/04/18 0435  ETH <10    Other results: EKG: sinus rhythm at 76 bpm.  Imaging: Ct Head Wo Contrast  Result Date: 12/04/2018 CLINICAL DATA:  67 year old male with slurred speech. EXAM: CT HEAD WITHOUT CONTRAST TECHNIQUE: Contiguous axial images were obtained from the base of the skull through the vertex without intravenous contrast. COMPARISON:  Head CT dated 07/14/2006 FINDINGS: Brain: The  ventricles and sulci are appropriate size for patient's age. Mild periventricular and deep white matter chronic microvascular ischemic changes noted. There is an old area of infarct and encephalomalacia involving the inferior left temporal lobe. No acute intracranial hemorrhage. No mass effect or midline shift. No extra-axial fluid collection. Vascular: No hyperdense vessel or unexpected calcification. Skull: Normal. Negative for fracture or focal lesion. Sinuses/Orbits: No acute finding. Other: Phthisis bulbi of the left globe. The visualized paranasal sinuses and mastoid air cells are clear. IMPRESSION: 1. No acute intracranial hemorrhage. 2. Mild age-related atrophy and chronic microvascular ischemic changes. 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 Electronically Signed   By: Anner Crete M.D.   On: 12/04/2018 05:53   [name] Contrast  Result Date: 12/04/2018 CLINICAL DATA:  Left arm weakness and numbness, facial droop, and slurred speech. EXAM: MRI HEAD WITHOUT CONTRAST TECHNIQUE: Multiplanar, multiecho pulse sequences of the brain and surrounding structures were obtained without intravenous contrast. COMPARISON:  12/04/2018 head CT FINDINGS: Brain: There is no evidence of acute infarct, mass, midline shift, or extra-axial fluid collection. A chronic microhemorrhage is noted in the right superior frontal gyrus. Encephalomalacia anteriorly in the left temporal lobe may be post ischemic or posttraumatic. Chronic lacunar infarcts are present in the thalami, bilateral cerebral white matter, left lentiform nucleus/external capsule, right pons, and possibly left cerebellum. Patchy T2 hyperintensities in the cerebral white matter bilaterally are nonspecific but compatible with moderate chronic small vessel ischemic disease. There is mild-to-moderate cerebral atrophy. Vascular: Major intracranial vascular flow voids are preserved. Skull and upper cervical spine: Unremarkable bone marrow signal. Sinuses/Orbits: Right  cataract extraction. Left phthisis bulbi. Small bilateral mastoid effusions. Clear paranasal sinuses. Other: None. IMPRESSION: 1. No acute intracranial abnormality. 2. Moderate chronic small vessel ischemic disease with numerous chronic lacunar infarcts as above. 3. 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 Electronically Signed   By: Logan Bores M.D.   On: 12/04/2018 08:41   US Renal  Result Date: 12/04/2018 CLINICAL DATA:  Acute renal injury EXAM: RENAL / URINARY TRACT ULTRASOUND COMPLETE COMPARISON:  06/29/2017 FINDINGS: Right Kidney: Renal measurements: 9.6 x 5.8 x 5.0 cm = volume: 147 mL . Echogenicity within normal limits. No mass or hydronephrosis visualized. Left Kidney: Renal measurements: 9.4 x 5.4 x 5.4 cm. = volume: 145 mL. No hydronephrosis is noted. There is a 1.6 cm cyst in the lower pole of the left kidney. This is stable from the prior exam. Bladder: Mild thickening of the bladder wall is noted although this is felt to be related to incomplete distension. IMPRESSION: Stable left renal cyst.  No acute abnormality noted. Electronically Signed   By: Inez Catalina M.D.   On: 12/04/2018 07:44   US Carotid Bilateral (at [facility] Only)  Result Date: 12/04/2018 CLINICAL DATA:  CVA EXAM: BILATERAL CAROTID DUPLEX ULTRASOUND TECHNIQUE: Pearline Cables scale imaging, color Doppler and duplex ultrasound were performed of bilateral carotid and vertebral arteries in the neck. COMPARISON:  09/14/2016 FINDINGS: Criteria: Quantification of carotid stenosis is based on velocity parameters that correlate the residual internal carotid diameter with NASCET-based stenosis levels, using the diameter of the distal internal carotid lumen as the denominator for stenosis measurement. The following velocity measurements were obtained: RIGHT ICA: 84 cm/sec CCA: 65 cm/sec SYSTOLIC ICA/CCA RATIO:  1.3 ECA: 73 cm/sec LEFT ICA: 56 cm/sec CCA: 66 cm/sec SYSTOLIC ICA/CCA RATIO:  0.9 ECA: 53  cm/sec RIGHT CAROTID ARTERY: Little if any plaque in the bulb. Low resistance internal carotid Doppler pattern is preserved. RIGHT VERTEBRAL ARTERY:  Antegrade. LEFT CAROTID ARTERY: Little if any plaque in the bulb. Low resistance internal carotid Doppler pattern is preserved. LEFT VERTEBRAL ARTERY:  Antegrade. IMPRESSION: Less than 50% stenosis in the right and left internal carotid arteries. Electronically Signed   By: Marybelle Killings M.D.   On: 12/04/2018 14:52   Dg Chest Port 1 View  Result Date: 12/04/2018 CLINICAL DATA:  68 year old male with weakness. EXAM: PORTABLE CHEST 1 VIEW COMPARISON:  Chest radiograph dated 05/19/2017 FINDINGS: Slight eventration of the right hemidiaphragm. No focal consolidation, pleural effusion, or pneumothorax. Probable area of interstitial coarsening or scarring in the right upper lobe. Stable cardiac silhouette. No acute osseous pathology. A 3 mm rounded radiopaque/metallic density over the T1 similar to prior radiograph. IMPRESSION: No active disease. Electronically Signed   By: Anner Crete M.D.   On: 12/04/2018 04:28    Assessment: 68 y.o. male with multiple medical problems presenting with episodes of left hemiparesis and slurred speech.  Patient appears to have cleared at this time other than a mild facial droop.  With multiple vascular risk factors TIA is on the differential,  MRI of the brain reviewed and shows no acute changes although significant small vessel ischemic changes noted. Patient on ASA and statin prior to admission.   Carotid dopplers show no evidence of hemodynamically significant stenosis.  Echocardiogram pending.  A1c pending, LDL pending.  Stroke Risk Factors - diabetes mellitus, hyperlipidemia and hypertension  Plan: 1. HgbA1c, fasting lipid panel.  Goal A1c<7.0 and goal LDL<70.   2. MRI of the cervical spine without contrast 3. PT consult, OT consult, Speech consult 4. Echocardiogram 5. EEG 6. Prophylactic therapy-Continue ASA and  statin 7. NPO until RN stroke swallow screen 8. Telemetry monitoring 9. Frequent neuro checks 10. Check orthostatics   Alexis Goodell, MD Neurology [phone] 12/04/2018, 3:46 PM

## 2018-12-04 NOTE — ED Notes (Signed)
Pt assisted to the bathroom at this time.

## 2018-12-04 NOTE — ED Notes (Signed)
ED TO INPATIENT HANDOFF REPORT  ED Nurse Name and Phone #:  Jinny Blossom, RN  S Name/Age/Gender Cameron Adams 68 y.o. male Room/Bed: ED05A/ED05A  Code Status   Code Status: Full Code  Home/SNF/Other Home Patient oriented to: self and place Is this baseline? Yes   Triage Complete: Triage complete  Chief Complaint Stroke like symptoms  Triage Note Pt arrived from home via EMS with complaints of stroke-like symptoms. Pt is alert and oriented x 4. Pt has left sided weakness and facial droop. Pt states he has also been dealing with some nerve issues in his neck and back so its hard for him to tell if the weakness is from nerves or a possible stroke. Ems states that grips were equal and that pt stated that his speech was a little more slurred than usual. Pt has Hx of diabetes. VS per EMS [id number]. EMS states that this happened around 6:00pm last night but symptoms resolved about 15 min later and then around 2:00am today the symptoms returned.   Allergies Allergies  Allergen Reactions  . Gabapentin     Other reaction(s): Other (See Comments) Trouble sleeping     Level of Care/Admitting Diagnosis ED Disposition    ED Disposition Condition [place]: [place] [[id number]]  Level of Care: Telemetry [5]  Diagnosis: AKI (acute kidney injury) [facility]) [[id number]]  Admitting Physician: Gorden Harms [[id number]]  Attending Physician: Gorden Harms [[id number]]  Estimated length of stay: past midnight tomorrow  Certification:: I certify this patient will need inpatient services for at least 2 midnights  PT Class (Do Not Modify): Inpatient [101]  PT Acc Code (Do Not Modify): Private [1]       B Medical/Surgery History Past Medical History:  Diagnosis Date  . Anemia of chronic disease   . Blindness of left eye   . Chronic kidney disease, stage III (moderate) (HCC)   . Diabetes ([place])   . Diarrhea   . Diastolic dysfunction    a. 06/2017 Echo:  EF 55-60%, no rwma, Gr2 DD, Asc Ao 3.9cm, nl RV fxn, nl PASP.  Marland Kitchen Hypertension   . Hypothyroidism   . Monoclonal gammopathy of unknown significance (MGUS)    a. prev seen @ [facility].  Lost to f/u - never had BM bx.  . Orthostatic hypotension   . Syncope and collapse    Past Surgical History:  Procedure Laterality Date  . 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 PERIPHERAL VASCULAR CATHETERIZATION N/A 09/15/2016   Procedure: Lower Extremity Angiography;  Surgeon: Algernon Huxley, MD;  Location: [place] [facility];  Service: Cardiovascular;  Laterality: N/A;  . THYROID SURGERY       A IV Location/Drains/Wounds Patient Lines/Drains/Airways Status   Active Line/Drains/Airways    Name:   Placement date:   Placement time:   Site:   Days:   Peripheral IV 06/28/17 Right Forearm   06/28/17    1602    Forearm   524   Peripheral IV 12/04/18 Left Wrist   12/04/18    0849    Wrist   less than 1   Peripheral IV 12/04/18 Right Antecubital   12/04/18    1109    Antecubital   less than 1   Post Cath / Sheath 09/15/16   09/15/16    1446    -  810   Wound / Incision (Open or Dehisced) 09/13/16 Diabetic ulcer Toe (Comment  which one) Right right 2nd toe   09/13/16    1947    Toe (Comment  which one)   812   Wound / Incision (Open or Dehisced) 05/19/17 Non-pressure wound Back Left   05/19/17    0850    Back   564          Intake/Output Last 24 hours No intake or output data in the 24 hours ending 12/04/18 1745  Labs/Imaging Results for orders placed or performed during the hospital encounter of 12/04/18 (from the past 48 hour(s))  Urine Drug Screen, Qualitative     Status: None   Collection Time: 12/04/18  4:35 AM  Result Value Ref Range   Tricyclic, Ur Screen NONE DETECTED NONE DETECTED   Amphetamines, Ur Screen NONE DETECTED NONE DETECTED   MDMA (Ecstasy)Ur Screen NONE DETECTED NONE  DETECTED   Cocaine Metabolite,Ur [place] NONE DETECTED NONE DETECTED   Opiate, Ur Screen NONE DETECTED NONE DETECTED   Phencyclidine (PCP) Ur S NONE DETECTED NONE DETECTED   Cannabinoid 50 Ng, Ur [place] NONE DETECTED NONE DETECTED   Barbiturates, Ur Screen NONE DETECTED NONE DETECTED   Benzodiazepine, Ur Scrn NONE DETECTED NONE DETECTED   Methadone Scn, Ur NONE DETECTED NONE DETECTED    Comment: (NOTE) Tricyclics + metabolites, urine    Cutoff 1000 ng/mL Amphetamines + metabolites, urine  Cutoff 1000 ng/mL MDMA (Ecstasy), urine              Cutoff 500 ng/mL Cocaine Metabolite, urine          Cutoff 300 ng/mL Opiate + metabolites, urine        Cutoff 300 ng/mL Phencyclidine (PCP), urine         Cutoff 25 ng/mL Cannabinoid, urine                 Cutoff 50 ng/mL Barbiturates + metabolites, urine  Cutoff 200 ng/mL Benzodiazepine, urine              Cutoff 200 ng/mL Methadone, urine                   Cutoff 300 ng/mL The urine drug screen provides only a preliminary, unconfirmed analytical test result and should not be used for non-medical purposes. Clinical consideration and professional judgment should be applied to any positive drug screen result due to possible interfering substances. A more specific alternate chemical method must be used in order to obtain a confirmed analytical result. Gas chromatography / mass spectrometry (GC/MS) is the preferred confirmat ory method. Performed at [facility], [address] [place], [place] [address]   Urinalysis, Routine w reflex microscopic     Status: Abnormal   Collection Time: 12/04/18  4:35 AM  Result Value Ref Range   Color, Urine YELLOW (A) YELLOW   APPearance CLEAR (A) CLEAR   Specific Gravity, Urine 1.012 1.005 - 1.030   pH 5.0 5.0 - 8.0   Glucose, UA NEGATIVE NEGATIVE mg/dL   Hgb urine dipstick MODERATE (A) NEGATIVE   Bilirubin Urine NEGATIVE NEGATIVE   Ketones, ur NEGATIVE NEGATIVE mg/dL   Protein, ur 100 (A) NEGATIVE  mg/dL   Nitrite NEGATIVE NEGATIVE   Leukocytes,Ua NEGATIVE NEGATIVE   RBC / HPF 21-50 0 - 5 RBC/hpf   WBC, UA 6-10 0 - 5 WBC/hpf   Bacteria, UA NONE SEEN NONE SEEN   Squamous Epithelial / LPF 0-5 0 -  5   Mucus PRESENT    Hyaline Casts, UA PRESENT     Comment: Performed at [facility], [address] [place], [place] [address]  Ethanol     Status: None   Collection Time: 12/04/18  4:35 AM  Result Value Ref Range   Alcohol, Ethyl (B) <10 <10 mg/dL    Comment: (NOTE) Lowest detectable limit for serum alcohol is 10 mg/dL. For medical purposes only. Performed at [facility], [address] [place], [place] [address]   CBC with Differential/Platelet     Status: Abnormal   Collection Time: 12/04/18  4:35 AM  Result Value Ref Range   WBC 4.3 4.0 - 10.5 K/uL   RBC 4.68 4.22 - 5.81 MIL/uL   Hemoglobin 11.6 (L) 13.0 - 17.0 g/dL   HCT 35.4 (L) 39.0 - 52.0 %   MCV 75.6 (L) 80.0 - 100.0 fL   MCH 24.8 (L) 26.0 - 34.0 pg   MCHC 32.8 30.0 - 36.0 g/dL   RDW 17.3 (H) 11.5 - 15.5 %   Platelets 118 (L) 150 - 400 K/uL    Comment: Immature Platelet Fraction may be clinically indicated, consider ordering this additional test [id number]    nRBC 0.0 0.0 - 0.2 %   Neutrophils Relative % 60 %   Neutro Abs 2.6 1.7 - 7.7 K/uL   Lymphocytes Relative 24 %   Lymphs Abs 1.0 0.7 - 4.0 K/uL   Monocytes Relative 8 %   Monocytes Absolute 0.3 0.1 - 1.0 K/uL   Eosinophils Relative 7 %   Eosinophils Absolute 0.3 0.0 - 0.5 K/uL   Basophils Relative 1 %   Basophils Absolute 0.0 0.0 - 0.1 K/uL   Immature Granulocytes 0 %   Abs Immature Granulocytes 0.01 0.00 - 0.07 K/uL    Comment: Performed at [facility], [address] [place], [place] [address]  Comprehensive metabolic panel     Status: Abnormal   Collection Time: 12/04/18  4:35 AM  Result Value Ref Range   Sodium 138 135 - 145 mmol/L   Potassium 4.8 3.5 - 5.1 mmol/L   Chloride 114 (H) 98 - 111 mmol/L   CO2 18 (L)  22 - 32 mmol/L   Glucose, Bld 121 (H) 70 - 99 mg/dL   BUN 47 (H) 8 - 23 mg/dL   Creatinine, Ser 2.86 (H) 0.61 - 1.24 mg/dL   Calcium 8.9 8.9 - 10.3 mg/dL   Total Protein 7.0 6.5 - 8.1 g/dL   Albumin 3.7 3.5 - 5.0 g/dL   AST 31 15 - 41 U/L   ALT 52 (H) 0 - 44 U/L   Alkaline Phosphatase 93 38 - 126 U/L   Total Bilirubin 0.7 0.3 - 1.2 mg/dL   GFR calc non Af Amer 22 (L) >60 mL/min   GFR calc Af Amer 25 (L) >60 mL/min   Anion gap 6 5 - 15    Comment: Performed at [facility], [address] [place], [place] [address]  Protime-INR     Status: None   Collection Time: 12/04/18  4:35 AM  Result Value Ref Range   Prothrombin Time 14.0 11.4 - 15.2 seconds   INR 1.1 0.8 - 1.2    Comment: (NOTE) INR goal varies based on device and disease states. Performed at [facility], [address] [place], [place] [address]   APTT     Status: None   Collection Time: 12/04/18  4:35 AM  Result Value Ref  Range   aPTT 33 24 - 36 seconds    Comment: Performed at [facility], [place], [place] [address]  Troponin I -     Status: Abnormal   Collection Time: 12/04/18  4:35 AM  Result Value Ref Range   Troponin I 0.51 (HH) <0.03 ng/mL    Comment: CRITICAL RESULT CALLED TO, READ BACK BY AND VERIFIED WITH MATTHEW MARTIN AT 0510 ON 12/04/2018 [place]. Performed at [facility], [address] [place], [place] [address]   Na and K (sodium & potassium), rand urine     Status: None   Collection Time: 12/04/18  4:35 AM  Result Value Ref Range   Sodium, Ur 43 mmol/L   Potassium Urine 13 mmol/L    Comment: Performed at [facility], [address] [place], [place] [address]  Creatinine, urine, random     Status: None   Collection Time: 12/04/18  4:35 AM  Result Value Ref Range   Creatinine, Urine 158 mg/dL    Comment: Performed at [facility], [address] [place], [place] [address]  Protein, urine, random     Status: None    Collection Time: 12/04/18  4:35 AM  Result Value Ref Range   Total Protein, Urine 211 mg/dL    Comment: RESULT CONFIRMED BY MANUAL DILUTION. QSD NO NORMAL RANGE ESTABLISHED FOR THIS TEST Performed at [facility], [address] [place], [place] [address]   Transferrin     Status: Abnormal   Collection Time: 12/04/18  7:00 AM  Result Value Ref Range   Transferrin 138 (L) 180 - 329 mg/dL    Comment: Performed at [place] [address], [place], [place] [address]  Troponin I - Once     Status: Abnormal   Collection Time: 12/04/18  8:40 AM  Result Value Ref Range   Troponin I 0.49 (HH) <0.03 ng/mL    Comment: CRITICAL VALUE NOTED. VALUE IS CONSISTENT WITH PREVIOUSLY REPORTED/CALLED VALUE.PMF Performed at [facility], [address] [place], [place] [address]   Magnesium     Status: Abnormal   Collection Time: 12/04/18  8:40 AM  Result Value Ref Range   Magnesium 1.4 (L) 1.7 - 2.4 mg/dL    Comment: Performed at [facility], [address] [place], [place] [address]  Phosphorus     Status: None   Collection Time: 12/04/18  8:40 AM  Result Value Ref Range   Phosphorus 4.1 2.5 - 4.6 mg/dL    Comment: Performed at [facility], [address] [place], [place] [address]  Iron and TIBC     Status: Abnormal   Collection Time: 12/04/18  8:40 AM  Result Value Ref Range   Iron 50 45 - 182 ug/dL   TIBC 198 (L) 250 - 450 ug/dL   Saturation Ratios 25 17.9 - 39.5 %   UIBC 148 ug/dL    Comment: Performed at [facility], [address] [place], [place] [address]  Ferritin     Status: None   Collection Time: 12/04/18  8:40 AM  Result Value Ref Range   Ferritin 235 24 - 336 ng/mL    Comment: Performed at [facility], [address] [place],  [address]  Reticulocytes     Status: Abnormal   Collection Time: 12/04/18  8:40 AM  Result Value Ref Range   Retic Ct Pct 0.5 0.4 - 3.1 %   RBC. 4.20 (L)  4.22 - 5.81  MIL/uL   Retic Count, Absolute 21.4 19.0 - 186.0 K/uL   Immature Retic Fract 5.0 2.3 - 15.9 %    Comment: Performed at [facility], [address] [place], [place] [address]  TSH     Status: Abnormal   Collection Time: 12/04/18  8:40 AM  Result Value Ref Range   TSH 0.018 (L) 0.350 - 4.500 uIU/mL    Comment: Performed by a 3rd Generation assay with a functional sensitivity of <=0.01 uIU/mL. Performed at [facility], [address] [place], [place] [address]   Troponin I - Once     Status: Abnormal   Collection Time: 12/04/18 11:09 AM  Result Value Ref Range   Troponin I 0.52 (HH) <0.03 ng/mL    Comment: CRITICAL VALUE NOTED. VALUE IS CONSISTENT WITH PREVIOUSLY REPORTED/CALLED VALUE JJB Performed at [facility], [address] [place], [place] [address]   Glucose, capillary     Status: None   Collection Time: 12/04/18 11:57 AM  Result Value Ref Range   Glucose-Capillary 91 70 - 99 mg/dL   Ct Head Wo Contrast  Result Date: 12/04/2018 CLINICAL DATA:  68 year old male with slurred speech. EXAM: CT HEAD WITHOUT CONTRAST TECHNIQUE: Contiguous axial images were obtained from the base of the skull through the vertex without intravenous contrast. COMPARISON:  Head CT dated 07/14/2006 FINDINGS: Brain: The ventricles and sulci are appropriate size for patient's age. Mild periventricular and deep white matter chronic microvascular ischemic changes noted. There is an old area of infarct and encephalomalacia involving the inferior left temporal lobe. No acute intracranial hemorrhage. No mass effect or midline shift. No extra-axial fluid collection. Vascular: No hyperdense vessel or unexpected calcification. Skull: Normal. Negative for fracture or focal lesion. Sinuses/Orbits: No acute finding. Other: Phthisis bulbi of the left globe. The visualized paranasal sinuses and mastoid air cells are clear. IMPRESSION: 1. No acute intracranial hemorrhage. 2. Mild age-related  atrophy and chronic microvascular ischemic changes. 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 Electronically Signed   By: Anner Crete M.D.   On: 12/04/2018 05:53   [name] Contrast  Result Date: 12/04/2018 CLINICAL DATA:  Left arm weakness and numbness, facial droop, and slurred speech. EXAM: MRI HEAD WITHOUT CONTRAST TECHNIQUE: Multiplanar, multiecho pulse sequences of the brain and surrounding structures were obtained without intravenous contrast. COMPARISON:  12/04/2018 head CT FINDINGS: Brain: There is no evidence of acute infarct, mass, midline shift, or extra-axial fluid collection. A chronic microhemorrhage is noted in the right superior frontal gyrus. Encephalomalacia anteriorly in the left temporal lobe may be post ischemic or posttraumatic. Chronic lacunar infarcts are present in the thalami, bilateral cerebral white matter, left lentiform nucleus/external capsule, right pons, and possibly left cerebellum. Patchy T2 hyperintensities in the cerebral white matter bilaterally are nonspecific but compatible with moderate chronic small vessel ischemic disease. There is mild-to-moderate cerebral atrophy. Vascular: Major intracranial vascular flow voids are preserved. Skull and upper cervical spine: Unremarkable bone marrow signal. Sinuses/Orbits: Right cataract extraction. Left phthisis bulbi. Small bilateral mastoid effusions. Clear paranasal sinuses. Other: None. IMPRESSION: 1. No acute intracranial abnormality. 2. Moderate chronic small vessel ischemic disease with numerous chronic lacunar infarcts as above. 3. 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 Electronically Signed   By: Logan Bores M.D.   On: 12/04/2018 08:41   Mr Cervical Spine Wo Contrast  Result Date: 12/04/2018 CLINICAL DATA:  68 y/o M; recurrent episodes of left arm numbness and weakness. EXAM:  MRI CERVICAL SPINE WITHOUT CONTRAST TECHNIQUE: Multiplanar, multisequence MR imaging of the cervical  spine was performed. No intravenous contrast was administered. COMPARISON:  07/15/2006 neck radiographs. FINDINGS: Alignment: Physiologic. Vertebrae: Mild edema within the odontoid process and small soft tissue pannus posterior to the odontoid process. Cord: Normal signal and morphology. Posterior Fossa, vertebral arteries, paraspinal tissues: Negative. Disc levels: C2-3: Small right foraminal disc protrusion with mild right foraminal stenosis. No spinal canal stenosis. C3-4: Disc osteophyte complex with bilateral uncovertebral and facet hypertrophy resulting in moderate right and mild left foraminal stenosis. No significant spinal canal stenosis. C4-5: Disc osteophyte complex with left-greater-than-right uncovertebral and facet hypertrophy. Moderate right and moderate to severe left foraminal stenosis. There is a small central protrusion contacts the anterior cord and results in mild spinal canal stenosis. C5-6: Disc osteophyte complex with central disc protrusion as well as bilateral uncovertebral and facet hypertrophy. Moderate bilateral foraminal stenosis. The disc protrusion contacts the anterior cord and results in mild spinal canal stenosis. C6-7: Disc osteophyte complex with left-greater-than-right uncovertebral and facet hypertrophy. Mild right and moderate left neural foraminal stenosis. No significant spinal canal stenosis. C7-T1: No significant disc displacement, foraminal stenosis, or canal stenosis. IMPRESSION: 1. Mild edema within the odontoid process and small soft tissue pannus posterior to the odontoid process. Findings may reflect rheumatoid or crystal deposition disease. 2. No additional osseous or cord signal abnormality. 3. Cervical spondylosis with predominant discogenic degenerative changes greatest at the C4-5 and C5-6 levels. 4. Moderate to severe left C4-5 foraminal stenosis. Multilevel mild and moderate foraminal stenosis. 5. Mild C4-5 and C5-6 spinal canal stenosis. No high-grade spinal  canal stenosis. Electronically Signed   By: Kristine Garbe M.D.   On: 12/04/2018 17:14   US Renal  Result Date: 12/04/2018 CLINICAL DATA:  Acute renal injury EXAM: RENAL / URINARY TRACT ULTRASOUND COMPLETE COMPARISON:  06/29/2017 FINDINGS: Right Kidney: Renal measurements: 9.6 x 5.8 x 5.0 cm = volume: 147 mL . Echogenicity within normal limits. No mass or hydronephrosis visualized. Left Kidney: Renal measurements: 9.4 x 5.4 x 5.4 cm. = volume: 145 mL. No hydronephrosis is noted. There is a 1.6 cm cyst in the lower pole of the left kidney. This is stable from the prior exam. Bladder: Mild thickening of the bladder wall is noted although this is felt to be related to incomplete distension. IMPRESSION: Stable left renal cyst.  No acute abnormality noted. Electronically Signed   By: Inez Catalina M.D.   On: 12/04/2018 07:44   US Carotid Bilateral (at [facility] And [facility] Only)  Result Date: 12/04/2018 CLINICAL DATA:  CVA EXAM: BILATERAL CAROTID DUPLEX ULTRASOUND TECHNIQUE: Pearline Cables scale imaging, color Doppler and duplex ultrasound were performed of bilateral carotid and vertebral arteries in the neck. COMPARISON:  09/14/2016 FINDINGS: Criteria: Quantification of carotid stenosis is based on velocity parameters that correlate the residual internal carotid diameter with NASCET-based stenosis levels, using the diameter of the distal internal carotid lumen as the denominator for stenosis measurement. The following velocity measurements were obtained: RIGHT ICA: 84 cm/sec CCA: 65 cm/sec SYSTOLIC ICA/CCA RATIO:  1.3 ECA: 73 cm/sec LEFT ICA: 56 cm/sec CCA: 66 cm/sec SYSTOLIC ICA/CCA RATIO:  0.9 ECA: 53 cm/sec RIGHT CAROTID ARTERY: Little if any plaque in the bulb. Low resistance internal carotid Doppler pattern is preserved. RIGHT VERTEBRAL ARTERY:  Antegrade. LEFT CAROTID ARTERY: Little if any plaque in the bulb. Low resistance internal carotid Doppler pattern is preserved. LEFT VERTEBRAL ARTERY:  Antegrade. IMPRESSION:  Less than 50% stenosis in the right and  left internal carotid arteries. Electronically Signed   By: Marybelle Killings M.D.   On: 12/04/2018 14:52   Dg Chest Port 1 View  Result Date: 12/04/2018 CLINICAL DATA:  68 year old male with weakness. EXAM: PORTABLE CHEST 1 VIEW COMPARISON:  Chest radiograph dated 05/19/2017 FINDINGS: Slight eventration of the right hemidiaphragm. No focal consolidation, pleural effusion, or pneumothorax. Probable area of interstitial coarsening or scarring in the right upper lobe. Stable cardiac silhouette. No acute osseous pathology. A 3 mm rounded radiopaque/metallic density over the T1 similar to prior radiograph. IMPRESSION: No active disease. Electronically Signed   By: Anner Crete M.D.   On: 12/04/2018 04:28    Pending Labs Unresulted Labs (From admission, onward)    Start     Ordered   12/05/18 0500  Hemoglobin A1c  Tomorrow morning,   STAT     12/04/18 1353   12/05/18 0500  Lipid panel  Tomorrow morning,   STAT    Comments:  Fasting    12/04/18 1353   12/05/18 0500  CBC  Tomorrow morning,   STAT     12/04/18 0912   12/04/18 1800  Heparin level (unfractionated)  Once-Timed,   STAT     12/04/18 0912   12/04/18 1354  HIV antibody (Routine Testing)  Once,   STAT     12/04/18 1353   12/04/18 1354  Hemoglobin A1c  Once,   STAT     12/04/18 1353   12/04/18 0619  Urea nitrogen, urine  Add-on,   AD     12/04/18 0619          Vitals/Pain Today's Vitals   12/04/18 1430 12/04/18 1500 12/04/18 1530 12/04/18 1611  BP: (!) 188/91 (!) 188/89 (!) 154/90 (!) 155/99  Pulse: 72 74 72 73  Resp: 12 (!) 22 20 20   Temp:      TempSrc:      SpO2: 100% 98% 100% 100%  Weight:      Height:      PainSc:        Isolation Precautions No active isolations  Medications Medications  nitroGLYCERIN (NITROSTAT) SL tablet 0.4 mg (has no administration in time range)  aspirin chewable tablet 81 mg (81 mg Oral Not Given 12/04/18 1015)  heparin ADULT infusion 100 units/mL  (25000 units/244mL sodium chloride 0.45%) (750 Units/hr Intravenous New Bag/Given 12/04/18 0916)  amLODipine (NORVASC) tablet 5 mg (5 mg Oral Given 12/04/18 1213)  atorvastatin (LIPITOR) tablet 40 mg (has no administration in time range)  furosemide (LASIX) tablet 80 mg (80 mg Oral Given 12/04/18 1213)  citalopram (CELEXA) tablet 20 mg (20 mg Oral Given 12/04/18 1529)  hydrOXYzine (ATARAX/VISTARIL) tablet 10 mg (has no administration in time range)  glipiZIDE (GLUCOTROL) tablet 2.5 mg (has no administration in time range)  levothyroxine (SYNTHROID, LEVOTHROID) tablet 200 mcg (has no administration in time range)  lipase/protease/amylase (CREON) capsule 72,000 Units (has no administration in time range)  loperamide (IMODIUM) capsule 2 mg (has no administration in time range)  cholecalciferol (VITAMIN D3) tablet 1,000 Units (1,000 Units Oral Given 12/04/18 1528)  hydroxypropyl methylcellulose / hypromellose (ISOPTO TEARS / GONIOVISC) 2.5 % ophthalmic solution 1 drop (has no administration in time range)  aspirin EC tablet 81 mg (has no administration in time range)  nitroGLYCERIN (NITROSTAT) SL tablet 0.4 mg (has no administration in time range)  acetaminophen (TYLENOL) tablet 650 mg (has no administration in time range)  ondansetron (ZOFRAN) injection 4 mg (has no administration in time range)  sodium chloride flush (  NS) 0.9 % injection 3 mL (3 mLs Intravenous Given 12/04/18 1354)  sodium chloride flush (NS) 0.9 % injection 3 mL (has no administration in time range)  0.9 %  sodium chloride infusion (has no administration in time range)  ALPRAZolam (XANAX) tablet 0.25 mg (has no administration in time range)   stroke: mapping our early stages of recovery book (has no administration in time range)  aspirin chewable tablet 325 mg (325 mg Oral Given 12/04/18 0637)  heparin bolus via infusion 3,900 Units (3,900 Units Intravenous Bolus from Bag 12/04/18 0917)    Mobility walks with person assist High  fall risk   Focused Assessments Cardiac Assessment Handoff:  Cardiac Rhythm: Normal sinus rhythm Lab Results  Component Value Date   CKTOTAL 102 10/10/2013   CKMB 0.7 10/10/2013   TROPONINI 0.52 ([place]) 12/04/2018   No results found for: DDIMER Does the Patient currently have chest pain? No  , Neuro Assessment Handoff:  Swallow screen pass? Yes  Cardiac Rhythm: Normal sinus rhythm NIH Stroke Scale ( + Modified Stroke Scale Criteria)  LOC Questions (1b. )   +: Answers one question correctly LOC Commands (1c. )   + : Performs both tasks correctly Best Gaze (2. )  +: Normal Visual (3. )  +: No visual loss Motor Arm, Left (5a. )   +: No drift Motor Arm, Right (5b. )   +: No drift Motor Leg, Left (6a. )   +: No drift Motor Leg, Right (6b. )   +: No drift Sensory (8. )   +: Mild-to-moderate sensory loss, patient feels pinprick is less sharp or is dull on the affected side, or there is a loss of superficial pain with pinprick, but patient is aware of being touched(Pt c/o mild numbness to L leg) Best Language (9. )   +: No aphasia Extinction/Inattention (11.)   +: No Abnormality Modified SS Total  +: 2     Neuro Assessment:   Neuro Checks:      Last Documented NIHSS Modified Score: 2 (12/04/18 1400) Has TPA been given? No If patient is a Neuro Trauma and patient is going to OR before floor call report to [place] nurse: [phone] or [phone]     R Recommendations: See Admitting Provider Note  Report given to:   Additional Notes:  Initial stroke work up, EKG changes noticed approx 0930 2/29, pt from NSR to RBB.

## 2018-12-04 NOTE — ED Notes (Signed)
Pt transported to US at this time. 

## 2018-12-04 NOTE — ED Notes (Signed)
Date and time results received: 12/04/18 0510 (use smartphrase ".now" to insert current time)  Test: Troponin Critical Value: 0.51  Name of Provider Notified: Dr. Beather Arbour  Orders Received? Or Actions Taken?:

## 2018-12-04 NOTE — Progress Notes (Signed)
ANTICOAGULATION CONSULT NOTE - Initial Consult  Pharmacy Consult for Heparin Indication: chest pain/ACS  Allergies  Allergen Reactions  . Gabapentin     Other reaction(s): Other (See Comments) Trouble sleeping     Patient Measurements: Height: 5\' 10"  (177.8 cm) Weight: 145 lb (65.8 kg) IBW/kg (Calculated) : 73 Heparin Dosing Weight: 65.8 kg  Vital Signs: Temp: 97.7 F (36.5 C) (02/29 1944) Temp Source: Oral (02/29 1944) BP: 171/82 (02/29 1944) Pulse Rate: 68 (02/29 1944)  Labs: Recent Labs    12/04/18 0435 12/04/18 0840 12/04/18 1109 12/04/18 1752  HGB 11.6*  --   --   --   HCT 35.4*  --   --   --   PLT 118*  --   --   --   APTT 33  --   --   --   LABPROT 14.0  --   --   --   INR 1.1  --   --   --   HEPARINUNFRC  --   --   --  0.52  CREATININE 2.86*  --   --   --   TROPONINI 0.51* 0.49* 0.52*  --     Estimated Creatinine Clearance: 23 mL/min (A) (by C-G formula based on SCr of 2.86 mg/dL (H)).     Assessment: 68 yo male here with elevated troponin to start on heparin drip.  Pt is currently therapeutic @ HL = 0.52  aPTT 33, INR 1.1  Goal of Therapy:  Heparin level 0.3-0.7 units/ml Monitor platelets by anticoagulation protocol: Yes   Plan:  Continue heparin 750 units/hr (=7.5 ml/hr)  Confirmatory Heparin level in 8h - CBC in AM  Lu Duffel, PharmD, BCPS Clinical Pharmacist 12/04/2018 8:16 PM

## 2018-12-04 NOTE — ED Provider Notes (Addendum)
[facility] Emergency Department Provider Note   ____________________________________________   First MD Initiated Contact with Patient 12/04/18 [phone]     (approximate)  I have reviewed the triage vital signs and the nursing notes.   HISTORY  Chief Complaint Left arm weakness   HPI Cameron Adams is a 68 y.o. male brought to the ED from home via EMS with a chief complaint of left arm weakness.  Patient has had a history of "mild stroke", on aspirin therapy.  At 6 PM last night patient had onset of left arm weakness and numbness.  States symptoms resolved about 15 minutes later.  At the time family noted left facial droop and slurred speech.  No medical evaluation was sought at that time.  Patient again felt left arm weakness around 2 AM this morning.  Denies recent fever, chills, headache, vision changes, chest pain, shortness of breath, abdominal pain, nausea or vomiting.  Denies recent travel or trauma.  States he has chronic nerve issues in his neck and back which causes pain and radiculopathy.    Past Medical History:  Diagnosis Date  . Anemia of chronic disease   . Blindness of left eye   . Chronic kidney disease, stage III (moderate) (HCC)   . Diabetes ([place])   . Diarrhea   . Diastolic dysfunction    a. 06/2017 Echo: EF 55-60%, no rwma, Gr2 DD, Asc Ao 3.9cm, nl RV fxn, nl PASP.  Marland Kitchen Hypertension   . Hypothyroidism   . Monoclonal gammopathy of unknown significance (MGUS)    a. prev seen @ [facility].  Lost to f/u - never had BM bx.  . Orthostatic hypotension   . Syncope and collapse     Patient Active Problem List   Diagnosis Date Noted  . Dizziness 06/22/2017  . Hyperkalemia 06/16/2017  . Esophageal dysmotilities   . Dysphagia   . Rib fractures 05/17/2017  . Hemothorax, right 05/17/2017  . Recurrent major depressive disorder, in partial remission ([place]) 05/13/2017  . Pure hypercholesterolemia 05/13/2017  . IDDM (insulin dependent diabetes  mellitus) ([place]) 05/13/2017  . Hypothyroidism (acquired) 05/13/2017  . GAD (generalized anxiety disorder) 05/13/2017  . CKD (chronic kidney disease) stage 3, GFR 30-59 ml/min (HCC) 05/13/2017  . Toe gangrene ([place]) 09/16/2016  . PVD (peripheral vascular disease) ([place]) 09/16/2016  . Uncontrolled diabetes mellitus ([place]) 09/16/2016  . Hyponatremia 09/16/2016  . Hypokalemia 09/16/2016  . Essential hypertension, malignant 09/16/2016  . Abnormal angiogram 09/16/2016  . Gangrene ([place]) 09/13/2016  . Pruritus 03/26/2016  . Orthostatic hypotension 03/26/2016  . 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 Alcohol abuse 03/26/2016  . Hyperglycemia 03/23/2016  . Passive suicidal ideations 09/16/2014  . Dyspnea 11/09/2013  . CAP (community acquired pneumonia) 10/31/2013  . Chest pain 10/31/2013  . Chronic respiratory failure with hypoxia ([place]) 10/31/2013  . 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 Diabetic retinopathy ([place]) 06/16/2013  . Gynecomastia 04/18/2013    Past Surgical History:  Procedure Laterality Date  . 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 PERIPHERAL VASCULAR CATHETERIZATION N/A 09/15/2016   Procedure: Lower Extremity Angiography;  Surgeon: Algernon Huxley, MD;  Location: [facility];  Service: Cardiovascular;  Laterality: N/A;  . THYROID SURGERY      Prior to Admission medications   Medication Sig Start Date End Date Taking? Authorizing Provider  aspirin EC 81  MG tablet Take 81 mg by mouth daily.   Yes [provider]  atorvastatin (LIPITOR) 40 MG tablet Take 40 mg by mouth at bedtime.   Yes [provider]  Cholecalciferol (VITAMIN D-1000 MAX ST) 25 MCG (1000 UT) tablet Take 1,000 Units by mouth daily.    Yes [provider]  citalopram (CELEXA) 20 MG tablet Take 20 mg by mouth daily.   Yes [provider]  fludrocortisone (FLORINEF) 0.1 MG tablet Take 1 tablet (0.1 mg total) by mouth daily. 06/30/17  Yes Vaughan Basta, MD  glipiZIDE (GLUCOTROL) 5 MG tablet Take 2.5 mg by mouth daily before breakfast.  11/26/18  Yes [provider]  hydroxypropyl methylcellulose / hypromellose (ISOPTO TEARS / GONIOVISC) 2.5 % ophthalmic solution Place 1 drop into both eyes 3 (three) times daily as needed for dry eyes.   Yes [provider]  hydrOXYzine (ATARAX/VISTARIL) 10 MG tablet Take 10 mg by mouth every 8 (eight) hours as needed for itching.    Yes [provider]  levothyroxine (SYNTHROID, LEVOTHROID) 200 MCG tablet Take 1 tablet (200 mcg total) by mouth daily before breakfast. 05/21/17  Yes Vaughan Basta, MD  lipase/protease/amylase (CREON) 36000 UNITS CPEP capsule Take 2 capsules (72,000 Units total) by mouth 3 (three) times daily with meals. 06/29/17  Yes Vaughan Basta, MD  lipase/protease/amylase (CREON) 36000 UNITS CPEP capsule Take 1 capsule (36,000 Units total) by mouth 2 (two) times daily between meals. 06/29/17  Yes Vaughan Basta, MD  loperamide (IMODIUM) 2 MG capsule Take 1 capsule (2 mg total) by mouth every 6 (six) hours as needed for diarrhea or loose stools. 06/29/17  Yes Vaughan Basta, MD  amLODipine (NORVASC) 5 MG tablet  09/15/18   [provider]  feeding supplement, ENSURE ENLIVE, (ENSURE ENLIVE) LIQD Take 237 mLs by mouth 3 (three) times daily between meals. Patient not taking: Reported on 06/22/2017 05/20/17   Vaughan Basta, MD  furosemide (LASIX) 80 MG tablet Take 80 mg by mouth.  09/22/18   [provider]  gabapentin (NEURONTIN) 100 MG capsule TAKE 1-3 CAPSULES BY MOUTH BEFORE BEDTIME FOR ARM PAIN 01/14/17   [provider]  insulin aspart (NOVOLOG) 100 UNIT/ML injection Inject 5 Units into the skin 3 (three) times daily before meals.    [provider]  insulin  glargine (LANTUS) 100 UNIT/ML injection Inject 0.1 mLs (10 Units total) into the skin at bedtime. Patient not taking: Reported on 12/04/2018 06/19/17   Vaughan Basta, MD  LEVEMIR FLEXTOUCH 100 UNIT/ML Pen  11/01/18   [provider]  midodrine (PROAMATINE) 5 MG tablet Take 1 tablet (5 mg total) by mouth 3 (three) times daily with meals. Patient not taking: Reported on 12/04/2018 06/29/17   Vaughan Basta, MD  pantoprazole (PROTONIX) 40 MG tablet Take 1 tablet (40 mg total) by mouth daily. Patient not taking: Reported on 12/04/2018 06/30/17   Vaughan Basta, MD  patiromer [facility]) 8.4 g packet Take 2 packets (16.8 g total) by mouth daily. Patient not taking: Reported on 12/04/2018 06/30/17   Vaughan Basta, MD  PREVALITE 4 g packet  09/17/18   [provider]    Allergies Gabapentin  Family History  Problem Relation Age of Onset  . Kidney failure Mother   . Heart disease Father   . Kidney failure Father   . Heart disease Brother     Social History Social History   Tobacco Use  . Smoking status: Former Smoker    Packs/day: 0.10  Years: 25.00    Pack years: 2.50    Types: Cigarettes    Last attempt to quit: 10/31/1978    Years since quitting: 40.1  . Smokeless tobacco: Never Used  . Tobacco comment: smoked 2-3 cigarettes/day  Substance Use Topics  . Alcohol use: No    Comment: previously drank heavily but not in many years  . Drug use: No    Review of Systems  Constitutional: No fever/chills Eyes: No visual changes. ENT: No sore throat. Cardiovascular: Denies chest pain. Respiratory: Denies shortness of breath. Gastrointestinal: No abdominal pain.  No nausea, no vomiting.  No diarrhea.  No constipation. Genitourinary: Negative for dysuria. Musculoskeletal: Negative for back pain. Skin: Negative for rash. Neurological: Positive for left arm weakness.  Negative for headaches or  numbness.   ____________________________________________   PHYSICAL EXAM:  VITAL SIGNS: ED Triage Vitals  Enc Vitals Group     BP      Pulse      Resp      Temp      Temp src      SpO2      Weight      Height      Head Circumference      Peak Flow      Pain Score      Pain Loc      Pain Edu?      Excl. in [place]?     Constitutional: Alert and oriented. Well appearing and in mild acute distress. Eyes: Conjunctivae are normal. PERRL. EOMI. Prosthetic left eye. Head: Atraumatic. Nose: No congestion/rhinnorhea. Mouth/Throat: Mucous membranes are moist.  Oropharynx non-erythematous. Neck: No stridor.   Cardiovascular: Normal rate, regular rhythm. Grossly normal heart sounds.  Good peripheral circulation. Respiratory: Normal respiratory effort.  No retractions. Lungs CTAB. Gastrointestinal: Soft and nontender. No distention. No abdominal bruits. No CVA tenderness. Musculoskeletal: No lower extremity tenderness nor edema.  No joint effusions. Neurologic: Alert and oriented x3.  Mild left facial droop noted.  Slurred speech noted.  4/5 motor strength left upper arm.  Normal speech and language. MAEx4. Skin:  Skin is warm, dry and intact. No rash noted. Psychiatric: Mood and affect are normal. Speech and behavior are normal.  ____________________________________________   LABS (all labs ordered are listed, but only abnormal results are displayed)  Labs Reviewed  URINALYSIS, ROUTINE W REFLEX MICROSCOPIC - Abnormal; Notable for the following components:      Result Value   Color, Urine YELLOW (*)    APPearance CLEAR (*)    Hgb urine dipstick MODERATE (*)    Protein, ur 100 (*)    All other components within normal limits  CBC WITH DIFFERENTIAL/PLATELET - Abnormal; Notable for the following components:   Hemoglobin 11.6 (*)    HCT 35.4 (*)    MCV 75.6 (*)    MCH 24.8 (*)    RDW 17.3 (*)    Platelets 118 (*)    All other components within normal limits  COMPREHENSIVE  METABOLIC PANEL - Abnormal; Notable for the following components:   Chloride 114 (*)    CO2 18 (*)    Glucose, Bld 121 (*)    BUN 47 (*)    Creatinine, Ser 2.86 (*)    ALT 52 (*)    GFR calc non Af Amer 22 (*)    GFR calc Af Amer 25 (*)    All other components within normal limits  TROPONIN I - Abnormal; Notable for the following components:   Troponin  I 0.51 (*)    All other components within normal limits  URINE DRUG SCREEN, QUALITATIVE ([facility] ONLY)  ETHANOL  PROTIME-INR  APTT  TROPONIN I  TROPONIN I  MAGNESIUM  PHOSPHORUS  IRON AND TIBC  FERRITIN  TRANSFERRIN  RETICULOCYTES  NA AND K (SODIUM & POTASSIUM), RAND UR  CREATININE, URINE, RANDOM  UREA NITROGEN, URINE  PROTEIN, URINE, RANDOM  TSH   ____________________________________________  EKG  ED ECG REPORT I, Aunika Kirsten J, the attending physician, personally viewed and interpreted this ECG.   Date: 12/04/2018  EKG Time: 0411  Rate: 69  Rhythm: normal EKG, normal sinus rhythm  Axis: Normal  Intervals:none  ST&T Change: Nonspecific  ____________________________________________  RADIOLOGY  ED MD interpretation: No acute cardiopulmonary process; no ICH  Official radiology report(s): Ct Head Wo Contrast  Result Date: 12/04/2018 CLINICAL DATA:  68 year old male with slurred speech. EXAM: CT HEAD WITHOUT CONTRAST TECHNIQUE: Contiguous axial images were obtained from the base of the skull through the vertex without intravenous contrast. COMPARISON:  Head CT dated 07/14/2006 FINDINGS: Brain: The ventricles and sulci are appropriate size for patient's age. Mild periventricular and deep white matter chronic microvascular ischemic changes noted. There is an old area of infarct and encephalomalacia involving the inferior left temporal lobe. No acute intracranial hemorrhage. No mass effect or midline shift. No extra-axial fluid collection. Vascular: No hyperdense vessel or unexpected calcification. Skull: Normal. Negative for  fracture or focal lesion. Sinuses/Orbits: No acute finding. Other: Phthisis bulbi of the left globe. The visualized paranasal sinuses and mastoid air cells are clear. IMPRESSION: 1. No acute intracranial hemorrhage. 2. Mild age-related atrophy and chronic microvascular ischemic changes. 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 Electronically Signed   By: Anner Crete M.D.   On: 12/04/2018 05:53   Dg Chest Port 1 View  Result Date: 12/04/2018 CLINICAL DATA:  68 year old male with weakness. EXAM: PORTABLE CHEST 1 VIEW COMPARISON:  Chest radiograph dated 05/19/2017 FINDINGS: Slight eventration of the right hemidiaphragm. No focal consolidation, pleural effusion, or pneumothorax. Probable area of interstitial coarsening or scarring in the right upper lobe. Stable cardiac silhouette. No acute osseous pathology. A 3 mm rounded radiopaque/metallic density over the T1 similar to prior radiograph. IMPRESSION: No active disease. Electronically Signed   By: Anner Crete M.D.   On: 12/04/2018 04:28    ____________________________________________   PROCEDURES  Procedure(s) performed (including Critical Care):  Procedures  NIH Stroke Scale   Time: 7:00 AM Person Administering Scale: Melisha Eggleton J  Administer stroke scale items in the order listed. Record performance in each category after each subscale exam. Do not go back and change scores. Follow directions provided for each exam technique. Scores should reflect what the patient does, not what the clinician thinks the patient can do. The clinician should record answers while administering the exam and work quickly. Except where indicated, the patient should not be coached (i.e., repeated requests to patient to make a special effort).   1a  Level of consciousness: 0=alert; keenly responsive  1b. LOC questions:  0=Performs both tasks correctly  1c. LOC commands: 0=Performs both tasks correctly  2.  Best Gaze: 0=normal  3.  Visual: 0=No visual loss  4.  Facial Palsy: 1=Minor paralysis (flattened nasolabial fold, asymmetric on smiling)  5a.  Motor left arm: 1=Drift, limb holds 90 (or 45) degrees but drifts down before full 10 seconds: does not hit bed  5b.  Motor right arm: 0=No drift, limb holds 90 (or 45) degrees for full 10 seconds  6a.  motor left leg: 0=No drift, limb holds 90 (or 45) degrees for full 10 seconds  6b  Motor right leg:  0=No drift, limb holds 90 (or 45) degrees for full 10 seconds  7. Limb Ataxia: 0=Absent  8.  Sensory: 0=Normal; no sensory loss  9. Best Language:  0=No aphasia, normal  10. Dysarthria: 1=Mild to moderate, patient slurs at least some words and at worst, can be understood with some difficulty  11. Extinction and Inattention: 0=No abnormality  12. Distal motor function: 0=Normal   Total:   3    CRITICAL CARE Performed by: Paulette Blanch   Total critical care time: 45 minutes  Critical care time was exclusive of separately billable procedures and treating other patients.  Critical care was necessary to treat or prevent imminent or life-threatening deterioration.  Critical care was time spent personally by me on the following activities: development of treatment plan with patient and/or surrogate as well as nursing, discussions with consultants, evaluation of patient's response to treatment, examination of patient, obtaining history from patient or surrogate, ordering and performing treatments and interventions, ordering and review of laboratory studies, ordering and review of radiographic studies, pulse oximetry and re-evaluation of patient's condition. ____________________________________________   INITIAL IMPRESSION / ASSESSMENT AND PLAN / ED COURSE  As part of my medical decision making, I reviewed the following data within the [place] History obtained from family, Nursing notes reviewed and incorporated, Labs reviewed, EKG interpreted, Old chart reviewed, Radiograph reviewed,  Discussed with admitting physician and Notes from prior ED visits    68 year old male who presents with left arm weakness. Differential diagnosis includes, but is not limited to, alcohol, illicit or prescription medications, or other toxic ingestion; intracranial pathology such as stroke or intracerebral hemorrhage; fever or infectious causes including sepsis; hypoxemia and/or hypercarbia; uremia; trauma; endocrine related disorders such as diabetes, hypoglycemia, and thyroid-related diseases; hypertensive encephalopathy; etc.  Will obtain lab work, CT head.  Patient is out of the window for TPA as his symptoms started at 6 PM.  Family did not notice that his symptoms resolved then restarted.    Clinical Course as of Feb 29 0700  Sat Dec 04, 2018  0605 Updated patient and family members of all test results.  Discussed with hospitalist Dr. Aliene Altes who recommends MRI brain before heparinizing the patient.   [JS]  0938 Patient's daughter reminded me that patient used to be on dialysis and stopped in January.  He had labs done by his doctor's office on 2/26.  I was able to compare these in the computer system and his kidney function is baseline.  He had hyperkalemia on 2/26; potassium is now within normal limits.   [JS]  [id number] Care transferred to Dr. Cinda Quest.  May heparinize and admit if MRI brain is negative for acute bleed or acute stroke.   [JS]    Clinical Course User Index [JS] Paulette Blanch, MD     ____________________________________________   FINAL CLINICAL IMPRESSION(S) / ED DIAGNOSES  Final diagnoses:  Cerebrovascular accident (CVA), unspecified mechanism ([place])  Elevated troponin  Weakness  Renal insufficiency     ED Discharge Orders    None       Note:  This document was prepared using Dragon voice recognition software and may include unintentional dictation errors.   Paulette Blanch, MD 12/04/18 1829    Paulette Blanch, MD 12/23/18 [phone]

## 2018-12-04 NOTE — ED Notes (Signed)
Pt returned from MRI at this time, pt placed back on monitor and heparin drip continued, verified with Tana Conch, Therapist, sports. Due to patient's daughter stating concerns regarding some medications while she was at bedside, pharmacy tech at bedside to get phone number of daughter to verify medications as patient is noted to be poor historian at this time.

## 2018-12-04 NOTE — ED Notes (Signed)
Pt taken to MRI at this time

## 2018-12-04 NOTE — ED Provider Notes (Addendum)
MRI shows no acute stroke.  We will get the heparin going and have him admitted.   Cameron Polio, MD 12/04/18 [phone] Patient had a second EKG that showed new right bundle branch and left anterior hemiblock compared to his initial EKG.  Discussed this chest now with Dr. Saralyn Pilar, cardiology.  The patient is not having chest pain.  Dr. Saralyn Pilar feels there is nothing to do about this EKG in the absence of any chest pain.  We are getting him on heparin anyway.  Second troponin actually came back down to slightly lower at 0.49.   Cameron Polio, MD 12/04/18 [phone]

## 2018-12-04 NOTE — ED Notes (Signed)
EKG changes observed while this RN at bedside hanging heparin drip. Repeat EKG obtained and given to EDP, Dr. Cinda Quest. Repeat troponin down 0.49 from 0.51, Dr. Cinda Quest made aware. Per Dr. Cinda Quest will consult Cardiology, repeat EKG at 1030.

## 2018-12-04 NOTE — ED Notes (Signed)
Pt sitting up in bed at this time, eating lunch. Pt is visualized in NAD at this time. VSS and WNL. Remains in RBBB.

## 2018-12-04 NOTE — ED Notes (Signed)
Pt IV noted to be removed at this time, this RN restarted IV to R AC, 3rd Troponin collected. Pt tolerated well. Heparin placed back to R AC. Will continue to monitor.

## 2018-12-04 NOTE — ED Notes (Signed)
Dr. Jerelyn Charles notified of trop of 0.52

## 2018-12-04 NOTE — ED Notes (Signed)
Pt given lunch tray.

## 2018-12-04 NOTE — Progress Notes (Signed)
ANTICOAGULATION CONSULT NOTE - Initial Consult  Pharmacy Consult for Heparin Indication: chest pain/ACS  Allergies  Allergen Reactions  . Gabapentin     Other reaction(s): Other (See Comments) Trouble sleeping     Patient Measurements: Height: 5\' 10"  (177.8 cm) Weight: 145 lb (65.8 kg) IBW/kg (Calculated) : 73 Heparin Dosing Weight: 65.8 kg  Vital Signs: Temp: 97.8 F (36.6 C) (02/29 0349) Temp Source: Oral (02/29 0349) BP: 159/95 (02/29 0836) Pulse Rate: 72 (02/29 0838)  Labs: Recent Labs    12/04/18 0435  HGB 11.6*  HCT 35.4*  PLT 118*  APTT 33  LABPROT 14.0  INR 1.1  CREATININE 2.86*  TROPONINI 0.51*    Estimated Creatinine Clearance: 23 mL/min (A) (by C-G formula based on SCr of 2.86 mg/dL (H)).     Assessment: 68 yo male here with elevated troponin to start on heparin drip.   aPTT 33, INR 1.1  Goal of Therapy:  Heparin level 0.3-0.7 units/ml Monitor platelets by anticoagulation protocol: Yes   Plan:  Heparin bolus 3900 units IV x1 then heparin 750 units/hr (=7.5 ml/hr) Heparin level 8h after start of heparin drip CBC in AM  Cameron Adams, Cameron Adams L 12/04/2018,8:57 AM

## 2018-12-04 NOTE — ED Notes (Signed)
This RN notified admitting MD of EKG changes and repeated elevated troponin. Per Admitting MD pt to go to 2A instead of 1A.

## 2018-12-04 NOTE — ED Notes (Signed)
Pt returned from [place] and MRI. Blood collected per order. VS obtained. Pt is A&O x 4. NAD noted at this time.

## 2018-12-04 NOTE — H&P (Signed)
[facility] NAME: Cameron Adams    MR#:  [id number]  DATE OF BIRTH:  26-May-1951  DATE OF ADMISSION:  12/04/2018  PRIMARY CARE PHYSICIAN: [name], MD   REQUESTING/REFERRING PHYSICIAN:   CHIEF COMPLAINT:   Left-sided weakness, facial droop  HISTORY OF PRESENT ILLNESS: Cameron Adams  is a 68 y.o. male with a known history per below presented to the emergency room with acute left-sided weakness, facial droop that started at 6 PM on yesterday, symptoms have been waxing and waning, did return earlier this morning-once again resolved, in the emergency room patient was noted to have elevated troponin of 0.  5 1, creatinine 2.8 with baseline around 1.8, MRI of the brain noted for old infarcts/no acute process, renal ultrasound was negative for any acute process, patient started on heparin drip, hospitalist asked to evaluate/admit, patient evaluated in the emergency room, no apparent distress, resting comfortably in bed, denies any weakness/paresthesias, patient is now be admitted for acute left-sided weakness/facial droop which have resolved, acute kidney injury with chronic kidney disease stage III, and elevated troponins with uncontrolled hypertension.  PAST MEDICAL HISTORY:   Past Medical History:  Diagnosis Date  . Anemia of chronic disease   . Blindness of left eye   . Chronic kidney disease, stage III (moderate) (HCC)   . Diabetes ([place])   . Diarrhea   . Diastolic dysfunction    a. 06/2017 Echo: EF 55-60%, no rwma, Gr2 DD, Asc Ao 3.9cm, nl RV fxn, nl PASP.  Marland Kitchen Hypertension   . Hypothyroidism   . Monoclonal gammopathy of unknown significance (MGUS)    a. prev seen @ [facility].  Lost to f/u - never had BM bx.  . Orthostatic hypotension   . Syncope and collapse     PAST SURGICAL HISTORY:  Past Surgical History:  Procedure Laterality Date  . AMPUTATION TOE Right 09/19/2016   Procedure: AMPUTATION TOE;  Surgeon: Albertine Patricia,  DPM;  Location: [facility] ORS;  Service: Podiatry;  Laterality: Right;  . PERIPHERAL VASCULAR CATHETERIZATION N/A 09/15/2016   Procedure: Lower Extremity Angiography;  Surgeon: Algernon Huxley, MD;  Location: [facility];  Service: Cardiovascular;  Laterality: N/A;  . THYROID SURGERY      SOCIAL HISTORY:  Social History   Tobacco Use  . Smoking status: Former Smoker    Packs/day: 0.10    Years: 25.00    Pack years: 2.50    Types: Cigarettes    Last attempt to quit: 10/31/1978    Years since quitting: 40.1  . Smokeless tobacco: Never Used  . Tobacco comment: smoked 2-3 cigarettes/day  Substance Use Topics  . Alcohol use: No    Comment: previously drank heavily but not in many years    FAMILY HISTORY:  Family History  Problem Relation Age of Onset  . Kidney failure Mother   . Heart disease Father   . Kidney failure Father   . Heart disease Brother     DRUG ALLERGIES:  Allergies  Allergen Reactions  . Gabapentin     Other reaction(s): Other (See Comments) Trouble sleeping     REVIEW OF SYSTEMS:   CONSTITUTIONAL: No fever, fatigue or weakness.  EYES: No blurred or double vision.  EARS, NOSE, AND THROAT: No tinnitus or ear pain.  RESPIRATORY: No cough, shortness of breath, wheezing or hemoptysis.  CARDIOVASCULAR: No chest pain, orthopnea, edema.  GASTROINTESTINAL: No nausea, vomiting, diarrhea or abdominal pain.  GENITOURINARY: No dysuria, hematuria.  ENDOCRINE: No polyuria, nocturia,  HEMATOLOGY: No anemia, easy bruising or bleeding SKIN: No rash or lesion. MUSCULOSKELETAL: No joint pain or arthritis.   NEUROLOGIC: Left-sided weakness, facial droop  pSYCHIATRY: No anxiety or depression.   MEDICATIONS AT HOME:  Prior to Admission medications   Medication Sig Start Date End Date Taking? Authorizing Provider  aspirin EC 81 MG tablet Take 81 mg by mouth daily.   Yes [provider]  atorvastatin (LIPITOR) 40 MG tablet Take 40 mg by mouth at bedtime.   Yes  [provider]  Cholecalciferol (VITAMIN D-1000 MAX ST) 25 MCG (1000 UT) tablet Take 1,000 Units by mouth daily.    Yes [provider]  citalopram (CELEXA) 20 MG tablet Take 20 mg by mouth daily.   Yes [provider]  fludrocortisone (FLORINEF) 0.1 MG tablet Take 1 tablet (0.1 mg total) by mouth daily. 06/30/17  Yes Vaughan Basta, MD  glipiZIDE (GLUCOTROL) 5 MG tablet Take 2.5 mg by mouth daily before breakfast.  11/26/18  Yes [provider]  hydroxypropyl methylcellulose / hypromellose (ISOPTO TEARS / GONIOVISC) 2.5 % ophthalmic solution Place 1 drop into both eyes 3 (three) times daily as needed for dry eyes.   Yes [provider]  hydrOXYzine (ATARAX/VISTARIL) 10 MG tablet Take 10 mg by mouth every 8 (eight) hours as needed for itching.    Yes [provider]  levothyroxine (SYNTHROID, LEVOTHROID) 200 MCG tablet Take 1 tablet (200 mcg total) by mouth daily before breakfast. 05/21/17  Yes Vaughan Basta, MD  lipase/protease/amylase (CREON) 36000 UNITS CPEP capsule Take 2 capsules (72,000 Units total) by mouth 3 (three) times daily with meals. 06/29/17  Yes Vaughan Basta, MD  lipase/protease/amylase (CREON) 36000 UNITS CPEP capsule Take 1 capsule (36,000 Units total) by mouth 2 (two) times daily between meals. 06/29/17  Yes Vaughan Basta, MD  loperamide (IMODIUM) 2 MG capsule Take 1 capsule (2 mg total) by mouth every 6 (six) hours as needed for diarrhea or loose stools. 06/29/17  Yes Vaughan Basta, MD  amLODipine (NORVASC) 5 MG tablet  09/15/18   [provider]  furosemide (LASIX) 80 MG tablet Take 80 mg by mouth.  09/22/18   [provider]  gabapentin (NEURONTIN) 100 MG capsule TAKE 1-3 CAPSULES BY MOUTH BEFORE BEDTIME FOR ARM PAIN 01/14/17   [provider]  insulin aspart (NOVOLOG) 100 UNIT/ML injection Inject 5 Units into the skin 3 (three) times daily before meals.     [provider]  LEVEMIR FLEXTOUCH 100 UNIT/ML Pen  11/01/18   [provider]  PREVALITE 4 g packet  09/17/18   [provider]      PHYSICAL EXAMINATION:   VITAL SIGNS: Blood pressure (!) 159/95, pulse 72, temperature 97.8 F (36.6 C), temperature source Oral, resp. rate 13, height 5\' 10"  (1.778 m), weight 65.8 kg, SpO2 99 %.  GENERAL:  68 y.o.-year-old patient lying in the bed with no acute distress.  Frail-appearing EYES: Pupils equal, round, reactive to light and accommodation. No scleral icterus. Extraocular muscles intact.  HEENT: Head atraumatic, normocephalic. Oropharynx and nasopharynx clear.  NECK:  Supple, no jugular venous distention. No thyroid enlargement, no tenderness.  LUNGS: Normal breath sounds bilaterally, no wheezing, rales,rhonchi or crepitation. No use of accessory muscles of respiration.  CARDIOVASCULAR: S1, S2 normal. No murmurs, rubs, or gallops.  ABDOMEN: Soft, nontender, nondistended. Bowel sounds present. No organomegaly or mass.  EXTREMITIES: No pedal edema, cyanosis, or clubbing.  NEUROLOGIC: Cranial nerves II through XII are  intact. Muscle strength 5/5 in all extremities. Sensation intact. Gait not checked.  PSYCHIATRIC: The patient is alert and oriented x 3.  SKIN: No obvious rash, lesion, or ulcer.   LABORATORY PANEL:   CBC Recent Labs  Lab 12/04/18 0435  WBC 4.3  HGB 11.6*  HCT 35.4*  PLT 118*  MCV 75.6*  MCH 24.8*  MCHC 32.8  RDW 17.3*  LYMPHSABS 1.0  MONOABS 0.3  EOSABS 0.3  BASOSABS 0.0   ------------------------------------------------------------------------------------------------------------------  Chemistries  Recent Labs  Lab 12/04/18 0435  NA 138  K 4.8  CL 114*  CO2 18*  GLUCOSE 121*  BUN 47*  CREATININE 2.86*  CALCIUM 8.9  AST 31  ALT 52*  ALKPHOS 93  BILITOT 0.7    ------------------------------------------------------------------------------------------------------------------ estimated creatinine clearance is 23 mL/min (A) (by C-G formula based on SCr of 2.86 mg/dL (H)). ------------------------------------------------------------------------------------------------------------------ No results for input(s): TSH, T4TOTAL, T3FREE, THYROIDAB in the last 72 hours.  Invalid input(s): FREET3   Coagulation profile Recent Labs  Lab 12/04/18 0435  INR 1.1   ------------------------------------------------------------------------------------------------------------------- No results for input(s): DDIMER in the last 72 hours. -------------------------------------------------------------------------------------------------------------------  Cardiac Enzymes Recent Labs  Lab 12/04/18 0435  TROPONINI 0.51*   ------------------------------------------------------------------------------------------------------------------ Invalid input(s): POCBNP  ---------------------------------------------------------------------------------------------------------------  Urinalysis    Component Value Date/Time   COLORURINE YELLOW (A) 12/04/2018 0435   APPEARANCEUR CLEAR (A) 12/04/2018 0435   LABSPEC 1.012 12/04/2018 0435   PHURINE 5.0 12/04/2018 0435   GLUCOSEU NEGATIVE 12/04/2018 0435   HGBUR MODERATE (A) 12/04/2018 0435   BILIRUBINUR NEGATIVE 12/04/2018 0435   KETONESUR NEGATIVE 12/04/2018 0435   PROTEINUR 100 (A) 12/04/2018 0435   NITRITE NEGATIVE 12/04/2018 0435   LEUKOCYTESUR NEGATIVE 12/04/2018 0435     RADIOLOGY: Ct Head Wo Contrast  Result Date: 12/04/2018 CLINICAL DATA:  68 year old male with slurred speech. EXAM: CT HEAD WITHOUT CONTRAST TECHNIQUE: Contiguous axial images were obtained from the base of the skull through the vertex without intravenous contrast. COMPARISON:  Head CT dated 07/14/2006 FINDINGS: Brain: The ventricles and sulci  are appropriate size for patient's age. Mild periventricular and deep white matter chronic microvascular ischemic changes noted. There is an old area of infarct and encephalomalacia involving the inferior left temporal lobe. No acute intracranial hemorrhage. No mass effect or midline shift. No extra-axial fluid collection. Vascular: No hyperdense vessel or unexpected calcification. Skull: Normal. Negative for fracture or focal lesion. Sinuses/Orbits: No acute finding. Other: Phthisis bulbi of the left globe. The visualized paranasal sinuses and mastoid air cells are clear. IMPRESSION: 1. No acute intracranial hemorrhage. 2. Mild age-related atrophy and chronic microvascular ischemic changes. 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 Electronically Signed   By: Anner Crete M.D.   On: 12/04/2018 05:53   [name] Contrast  Result Date: 12/04/2018 CLINICAL DATA:  Left arm weakness and numbness, facial droop, and slurred speech. EXAM: MRI HEAD WITHOUT CONTRAST TECHNIQUE: Multiplanar, multiecho pulse sequences of the brain and surrounding structures were obtained without intravenous contrast. COMPARISON:  12/04/2018 head CT FINDINGS: Brain: There is no evidence of acute infarct, mass, midline shift, or extra-axial fluid collection. A chronic microhemorrhage is noted in the right superior frontal gyrus. Encephalomalacia anteriorly in the left temporal lobe may be post ischemic or posttraumatic. Chronic lacunar infarcts are present in the thalami, bilateral cerebral white matter, left lentiform nucleus/external capsule, right pons, and possibly left cerebellum. Patchy T2 hyperintensities in the cerebral white matter bilaterally are nonspecific but compatible with moderate chronic small vessel ischemic disease. There is mild-to-moderate cerebral atrophy.  Vascular: Major intracranial vascular flow voids are preserved. Skull and upper cervical spine: Unremarkable bone marrow signal. Sinuses/Orbits: Right cataract extraction.  Left phthisis bulbi. Small bilateral mastoid effusions. Clear paranasal sinuses. Other: None. IMPRESSION: 1. No acute intracranial abnormality. 2. Moderate chronic small vessel ischemic disease with numerous chronic lacunar infarcts as above. 3. 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 Electronically Signed   By: Logan Bores M.D.   On: 12/04/2018 08:41   US Renal  Result Date: 12/04/2018 CLINICAL DATA:  Acute renal injury EXAM: RENAL / URINARY TRACT ULTRASOUND COMPLETE COMPARISON:  06/29/2017 FINDINGS: Right Kidney: Renal measurements: 9.6 x 5.8 x 5.0 cm = volume: 147 mL . Echogenicity within normal limits. No mass or hydronephrosis visualized. Left Kidney: Renal measurements: 9.4 x 5.4 x 5.4 cm. = volume: 145 mL. No hydronephrosis is noted. There is a 1.6 cm cyst in the lower pole of the left kidney. This is stable from the prior exam. Bladder: Mild thickening of the bladder wall is noted although this is felt to be related to incomplete distension. IMPRESSION: Stable left renal cyst.  No acute abnormality noted. Electronically Signed   By: Inez Catalina M.D.   On: 12/04/2018 07:44   Dg Chest Port 1 View  Result Date: 12/04/2018 CLINICAL DATA:  68 year old male with weakness. EXAM: PORTABLE CHEST 1 VIEW COMPARISON:  Chest radiograph dated 05/19/2017 FINDINGS: Slight eventration of the right hemidiaphragm. No focal consolidation, pleural effusion, or pneumothorax. Probable area of interstitial coarsening or scarring in the right upper lobe. Stable cardiac silhouette. No acute osseous pathology. A 3 mm rounded radiopaque/metallic density over the T1 similar to prior radiograph. IMPRESSION: No active disease. Electronically Signed   By: Anner Crete M.D.   On: 12/04/2018 04:28    EKG: Orders placed or performed during the hospital encounter of 12/04/18  . ED EKG  . ED EKG    IMPRESSION AND PLAN: *Acute left-sided weakness with facial  droop Resolved Admit to regular nursing for bed on our CVA/TIA protocol, aspirin daily, on heparin drip, neurology to see, MRI of the brain negative for any acute process/noted multiple old CVAs, telemetry to evaluate for possible arrhythmia given MRI findings, PT/OT/physical therapy to evaluate/treat, neurochecks per routine, continue close medical monitoring  *Acute elevated troponins Clinical exam inconsistent with ACS, CEs inconsistent with ACS Suspect secondary to uncontrolled hypertension/accelerated hypertension from demand ischemia Cardiology consulted for expert opinion, follow-up on echocardiogram, aspirin, heparin drip, ACS protocol while in house  *Acute kidney injury with chronic kidney disease stage III Avoid nephrotoxic agents, strict I&O monitoring, daily weights, BMP in the morning, consider nephrology consultation if any further worsening, renal ultrasound negative for any acute process  *Chronic orthostatic hypotension Stable On Florinef  *Chronic hypertension Continue Norvasc, Lasix, vitals per routine, make changes as per necessary  *Chronic hypothyroidism, unspecified Stable Continue Synthroid  *Chronic diabetes mellitus type 2 Stable Continue Glucotrol, carbohydrate consistent diet, sliding scale insulin with Accu-Cheks per routine   All the records are reviewed and case discussed with ED provider. Management plans discussed with the patient, family and they are in agreement.  CODE STATUS:full Code Status History    Date Active Date Inactive Code Status Order ID Comments User Context   06/22/2017 1735 06/29/2017 2042 Full Code [id number]  Vaughan Basta, MD Inpatient   06/16/2017 1849 06/19/2017 1841 Full Code [id number]  Vaughan Basta, MD Inpatient   05/17/2017 1550 05/20/2017 1744 Full Code [id number]  Idelle Crouch, MD Inpatient  09/13/2016 1756 09/16/2016 1856 Full Code [id number]  Fritzi Mandes, MD Inpatient       TOTAL TIME TAKING CARE  OF THIS PATIENT: 45 minutes.    Avel Peace Kesley Gaffey M.D on 12/04/2018   Between 7am to 6pm - Pager - [phone]  After 6pm go to [email or web address] - password [email or web address] [facility]  Office  [phone]  CC: Primary care physician; [name], MD   Note: This dictation was prepared with Dragon dictation along with smaller phrase technology. Any transcriptional errors that result from this process are unintentional.

## 2018-12-04 NOTE — ED Notes (Signed)
Pt transported to [place] by [place] tech at this time, MRI notified that [place] tech will bring patient to MRI after [place] completed. Pt updated to plan, states understanding at this time. Pt visualized in NAD at this time.

## 2018-12-05 ENCOUNTER — Inpatient Hospital Stay (HOSPITAL_COMMUNITY)
Admit: 2018-12-05 | Discharge: 2018-12-05 | Disposition: A | Discharge status: Home / Self Care | Payer: Medicare Other | Attending: Family Medicine | Admitting: Family Medicine

## 2018-12-05 ENCOUNTER — Inpatient Hospital Stay: Payer: Medicare Other

## 2018-12-05 DIAGNOSIS — R4182 Altered mental status, unspecified: Secondary | ICD-10-CM

## 2018-12-05 DIAGNOSIS — I361 Nonrheumatic tricuspid (valve) insufficiency: Secondary | ICD-10-CM

## 2018-12-05 DIAGNOSIS — R7989 Other specified abnormal findings of blood chemistry: Secondary | ICD-10-CM

## 2018-12-05 DIAGNOSIS — I340 Nonrheumatic mitral (valve) insufficiency: Secondary | ICD-10-CM

## 2018-12-05 LAB — CBC
HCT: 32.3 % — ABNORMAL LOW (ref 39.0–52.0)
HEMATOCRIT: 32.1 % — AB (ref 39.0–52.0)
Hemoglobin: 10.5 g/dL — ABNORMAL LOW (ref 13.0–17.0)
Hemoglobin: 10.8 g/dL — ABNORMAL LOW (ref 13.0–17.0)
MCH: 25 pg — ABNORMAL LOW (ref 26.0–34.0)
MCH: 24.7 pg — ABNORMAL LOW (ref 26.0–34.0)
MCHC: 32.7 g/dL (ref 30.0–36.0)
MCHC: 33.4 g/dL (ref 30.0–36.0)
MCV: 76.4 fL — ABNORMAL LOW (ref 80.0–100.0)
MCV: 73.9 fL — ABNORMAL LOW (ref 80.0–100.0)
Platelets: 108 10*3/uL — ABNORMAL LOW (ref 150–400)
Platelets: 108 K/uL — ABNORMAL LOW (ref 150–400)
RBC: 4.2 MIL/uL — ABNORMAL LOW (ref 4.22–5.81)
RBC: 4.37 MIL/uL (ref 4.22–5.81)
RDW: 17.2 % — ABNORMAL HIGH (ref 11.5–15.5)
RDW: 17.2 % — ABNORMAL HIGH (ref 11.5–15.5)
WBC: 3.9 10*3/uL — AB (ref 4.0–10.5)
WBC: 4.5 K/uL (ref 4.0–10.5)
nRBC: 0 % (ref 0.0–0.2)
nRBC: 0 % (ref 0.0–0.2)

## 2018-12-05 LAB — BASIC METABOLIC PANEL
ANION GAP: 5 (ref 5–15)
BUN: 50 mg/dL — ABNORMAL HIGH (ref 8–23)
CO2: 18 mmol/L — ABNORMAL LOW (ref 22–32)
Calcium: 8.6 mg/dL — ABNORMAL LOW (ref 8.9–10.3)
Chloride: 113 mmol/L — ABNORMAL HIGH (ref 98–111)
Creatinine, Ser: 2.76 mg/dL — ABNORMAL HIGH (ref 0.61–1.24)
GFR calc Af Amer: 26 mL/min — ABNORMAL LOW (ref >60)
GFR calc non Af Amer: 23 mL/min — ABNORMAL LOW (ref >60)
GLUCOSE: 171 mg/dL — AB (ref 70–99)
Potassium: 4.7 mmol/L (ref 3.5–5.1)
Sodium: 136 mmol/L (ref 135–145)

## 2018-12-05 LAB — PHOSPHORUS: Phosphorus: 3.6 mg/dL (ref 2.5–4.6)

## 2018-12-05 LAB — ECHOCARDIOGRAM COMPLETE
Height: 70 in
Weight: 2370.39 oz

## 2018-12-05 LAB — GLUCOSE, CAPILLARY: Glucose-Capillary: 85 mg/dL (ref 70–99)

## 2018-12-05 LAB — GASTROINTESTINAL PANEL BY PCR, STOOL (REPLACES STOOL CULTURE)
Adenovirus F40/41: NOT DETECTED
Astrovirus: NOT DETECTED
Campylobacter species: NOT DETECTED
Cryptosporidium: NOT DETECTED
Cyclospora cayetanensis: NOT DETECTED
ENTEROAGGREGATIVE E COLI (EAEC): NOT DETECTED
Entamoeba histolytica: NOT DETECTED
Enteropathogenic E coli (EPEC): NOT DETECTED
Enterotoxigenic E coli (ETEC): NOT DETECTED
Giardia lamblia: NOT DETECTED
Norovirus GI/GII: NOT DETECTED
Plesimonas shigelloides: NOT DETECTED
Rotavirus A: NOT DETECTED
SAPOVIRUS (I, II, IV, AND V): NOT DETECTED
SHIGA LIKE TOXIN PRODUCING E COLI (STEC): NOT DETECTED
Salmonella species: NOT DETECTED
Shigella/Enteroinvasive E coli (EIEC): NOT DETECTED
VIBRIO CHOLERAE: NOT DETECTED
Vibrio species: NOT DETECTED
Yersinia enterocolitica: NOT DETECTED

## 2018-12-05 LAB — COMPREHENSIVE METABOLIC PANEL WITH GFR
ALT: 47 U/L — ABNORMAL HIGH (ref 0–44)
AST: 31 U/L (ref 15–41)
Albumin: 3.5 g/dL (ref 3.5–5.0)
Alkaline Phosphatase: 91 U/L (ref 38–126)
Anion gap: 9 (ref 5–15)
BUN: 50 mg/dL — ABNORMAL HIGH (ref 8–23)
CO2: 21 mmol/L — ABNORMAL LOW (ref 22–32)
Calcium: 8.5 mg/dL — ABNORMAL LOW (ref 8.9–10.3)
Chloride: 111 mmol/L (ref 98–111)
Creatinine, Ser: 2.62 mg/dL — ABNORMAL HIGH (ref 0.61–1.24)
GFR calc Af Amer: 28 mL/min — ABNORMAL LOW (ref >60)
GFR calc non Af Amer: 24 mL/min — ABNORMAL LOW (ref >60)
Glucose, Bld: 90 mg/dL (ref 70–99)
Potassium: 4.9 mmol/L (ref 3.5–5.1)
Sodium: 141 mmol/L (ref 135–145)
Total Bilirubin: 0.6 mg/dL (ref 0.3–1.2)
Total Protein: 6.7 g/dL (ref 6.5–8.1)

## 2018-12-05 LAB — BLOOD GAS, ARTERIAL
ACID-BASE DEFICIT: 9.6 mmol/L — AB (ref 0.0–2.0)
Bicarbonate: 15.7 mmol/L — ABNORMAL LOW (ref 20.0–28.0)
FIO2: 0.21
O2 Saturation: 96.9 %
Patient temperature: 37
pCO2 arterial: 32 mmHg (ref 32.0–48.0)
pH, Arterial: 7.3 — ABNORMAL LOW (ref 7.350–7.450)
pO2, Arterial: 99 mmHg (ref 83.0–108.0)

## 2018-12-05 LAB — LIPID PANEL
Cholesterol: 102 mg/dL (ref 0–200)
HDL: 48 mg/dL (ref >40)
LDL Cholesterol: 46 mg/dL (ref 0–99)
Total CHOL/HDL Ratio: 2.1 RATIO
Triglycerides: 40 mg/dL (ref <150)
VLDL: 8 mg/dL (ref 0–40)

## 2018-12-05 LAB — HEPARIN LEVEL (UNFRACTIONATED)
Heparin Unfractionated: 0.44 IU/mL (ref 0.30–0.70)
Heparin Unfractionated: 0.5 IU/mL (ref 0.30–0.70)

## 2018-12-05 LAB — HEMOGLOBIN A1C
Hgb A1c MFr Bld: 7.7 % — ABNORMAL HIGH (ref 4.8–5.6)
Hgb A1c MFr Bld: 7.2 % — ABNORMAL HIGH (ref 4.8–5.6)
MEAN PLASMA GLUCOSE: 174.29 mg/dL
Mean Plasma Glucose: 159.94 mg/dL

## 2018-12-05 LAB — LACTIC ACID, PLASMA: Lactic Acid, Venous: 0.8 mmol/L (ref 0.5–1.9)

## 2018-12-05 LAB — AMMONIA: Ammonia: 33 umol/L (ref 9–35)

## 2018-12-05 LAB — MAGNESIUM: Magnesium: 1.4 mg/dL — ABNORMAL LOW (ref 1.7–2.4)

## 2018-12-05 LAB — MRSA PCR SCREENING: MRSA by PCR: NEGATIVE

## 2018-12-05 LAB — TROPONIN I: Troponin I: 0.4 ng/mL (ref <0.03)

## 2018-12-05 MED ORDER — FLORANEX PO PACK
1.0000 g | PACK | Freq: Three times a day (TID) | ORAL | Status: DC
  Administered 2018-12-05 – 2018-12-08 (×8): 1 g via ORAL
  Filled 2018-12-05 (×12): qty 1

## 2018-12-05 MED ORDER — VITAMIN D 25 MCG (1000 UNIT) PO TABS
1000.0000 [IU] | ORAL_TABLET | Freq: Every day | ORAL | Status: DC
  Administered 2018-12-05 – 2018-12-08 (×4): 1000 [IU] via ORAL
  Filled 2018-12-05: qty 1
  Filled 2018-12-05: qty 3
  Filled 2018-12-05 (×4): qty 1

## 2018-12-05 MED ORDER — SODIUM BICARBONATE 8.4 % IV SOLN
150.0000 meq | Freq: Once | INTRAVENOUS | Status: AC
  Administered 2018-12-05: 150 meq via INTRAVENOUS
  Filled 2018-12-05: qty 150

## 2018-12-05 MED ORDER — SODIUM BICARBONATE 650 MG PO TABS
650.0000 mg | ORAL_TABLET | Freq: Four times a day (QID) | ORAL | Status: DC
  Administered 2018-12-05 – 2018-12-06 (×3): 650 mg via ORAL
  Filled 2018-12-05 (×7): qty 1

## 2018-12-05 MED ORDER — LEVOTHYROXINE SODIUM 100 MCG PO TABS
100.0000 ug | ORAL_TABLET | Freq: Every day | ORAL | Status: DC
  Administered 2018-12-07 – 2018-12-08 (×2): 100 ug via ORAL
  Filled 2018-12-05 (×2): qty 1

## 2018-12-05 MED ORDER — HYDRALAZINE HCL 20 MG/ML IJ SOLN
10.0000 mg | INTRAMUSCULAR | Status: DC | PRN
  Administered 2018-12-05: 10 mg via INTRAVENOUS
  Filled 2018-12-05: qty 1

## 2018-12-05 NOTE — Care Management Note (Signed)
Case Management Note  Patient Details  Name: [name] MRN: [id number] Date of Birth: 1951/06/15  Subjective/Objective:   RNCM was asked to see the patient for home health needs. Patient tells me he lives with his daughter and prefer I speak with her about transition of care needs. Patient lives with his daughter [name] [phone]. She is in agreement the patient would benefit from home health. Patient has a walker, wheelchair and cane in the home. CMS Medicare.gov Compare Post Acute Care list reviewed with patient and daughter, they have no preference of agency. Previous documentation showed a workup for [facility] although the patient is not currently active with them. They are ok with using that agency again. Referral placed with [place]. Per MD patient likely to discharge tomorrow                  Action/Plan:   Expected Discharge Date:                  Expected Discharge Plan:  [place]  In-House Referral:     Discharge planning Services  CM Consult  Post Acute Care Choice:    Choice offered to:     DME Arranged:    DME Agency:     HH Arranged:    [place] Agency:  [facility]  Status of Service:  In process, will continue to follow  If discussed at Long Length of Stay Meetings, dates discussed:    Additional Comments:  Latanya Maudlin, RN 12/05/2018, 2:32 PM

## 2018-12-05 NOTE — Consult Note (Signed)
CARDIOLOGY  CONSULT NOTE  Patient ID: Cameron Adams, MRN: [id number], DOB/AGE: Dec 18, 1950 68 y.o. Admit date: 12/04/2018 Date of Consult: 12/05/2018  Primary Physician: [name], MD Primary Cardiologist: MA remote Cameron Adams is a 68 y.o. male who is being seen today for the evaluation of ELEVATED TROPONIN at the request of DR Salary.   Chief Complaint: elevated troponin   HPI Cameron Adams is a 68 y.o. male admitted with acute left-sided weakness with relatively rapid interval improvement per notes from neurology.  Troponins were measured they were elevated.  We are consulted.  He is better today  Has had no chest pain  Has chronic but variable sob, without edema or orthopnea or PND  Has a history of acute and chronic kidney injury and chronic orthostatic hypotension in the setting of hypertension.  Last seen by cardiology 9/18 for orthostatic intolerance he has poorly controlled diabetes.  Cardiac evaluation in the past included an echocardiogram 9/18 with normal LV function and normal RV function and mild ascending aortic dilatation.      Past Medical History:  Diagnosis Date  . Anemia of chronic disease   . Blindness of left eye   . Chronic kidney disease, stage III (moderate) (HCC)   . Diabetes ([place])   . Diarrhea   . Diastolic dysfunction    a. 06/2017 Echo: EF 55-60%, no rwma, Gr2 DD, Asc Ao 3.9cm, nl RV fxn, nl PASP.  Marland Kitchen Hypertension   . Hypothyroidism   . Monoclonal gammopathy of unknown significance (MGUS)    a. prev seen @ [facility].  Lost to f/u - never had BM bx.  . Orthostatic hypotension   . Syncope and collapse       Surgical History:  Past Surgical History:  Procedure Laterality Date  . 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 PERIPHERAL VASCULAR CATHETERIZATION N/A 09/15/2016   Procedure: Lower Extremity Angiography;   Surgeon: Algernon Huxley, MD;  Location: [facility];  Service: Cardiovascular;  Laterality: N/A;  . THYROID SURGERY       Home Meds: Prior to Admission medications   Medication Sig Start Date End Date Taking? Authorizing Provider  amLODipine (NORVASC) 5 MG tablet Take 5 mg by mouth daily.  09/15/18  Yes [provider]  aspirin EC 81 MG tablet Take 81 mg by mouth daily.   Yes [provider]  atorvastatin (LIPITOR) 40 MG tablet Take 40 mg by mouth at bedtime.   Yes [provider]  Cholecalciferol (VITAMIN D-1000 MAX ST) 25 MCG (1000 UT) tablet Take 2,000 Units by mouth daily.    Yes [provider]  citalopram (CELEXA) 20 MG tablet Take 20 mg by mouth daily.   Yes [provider]  furosemide (LASIX) 80 MG tablet Take 80 mg by mouth.  09/22/18  Yes [provider]  glipiZIDE (GLUCOTROL) 5 MG tablet Take 2.5 mg by mouth daily before breakfast.  11/26/18  Yes [provider]  hydroxypropyl methylcellulose / hypromellose (ISOPTO TEARS / GONIOVISC) 2.5 % ophthalmic solution Place 1 drop into both eyes 3 (three) times daily as needed for dry eyes.   Yes [provider]  levothyroxine (SYNTHROID, LEVOTHROID) 200 MCG tablet Take 1 tablet (200 mcg total) by mouth daily before breakfast. 05/21/17  Yes Vaughan Basta, MD  lipase/protease/amylase (CREON) 36000 UNITS CPEP capsule Take 2 capsules (72,000 Units  total) by mouth 3 (three) times daily with meals. 06/29/17  Yes Vaughan Basta, MD  loperamide (IMODIUM) 2 MG capsule Take 1 capsule (2 mg total) by mouth every 6 (six) hours as needed for diarrhea or loose stools. 06/29/17  Yes Vaughan Basta, MD    Inpatient Medications:  .  stroke: mapping our early stages of recovery book   Does not apply Once  . amLODipine  5 mg Oral Daily  . aspirin  81 mg Oral Daily  . atorvastatin  40 mg Oral QHS  . cholecalciferol  1,000 Units Oral Daily  . citalopram  20 mg  Oral Daily  . furosemide  80 mg Oral Daily  . glipiZIDE  2.5 mg Oral QAC breakfast  . lactobacillus  1 g Oral TID WC  . [START ON 12/06/2018] levothyroxine  100 mcg Oral QAC breakfast  . lipase/protease/amylase  72,000 Units Oral TID WC  . sodium bicarbonate  650 mg Oral QID  . sodium chloride flush  3 mL Intravenous Q12H      Allergies:  Allergies  Allergen Reactions  . Gabapentin     Other reaction(s): Other (See Comments) Trouble sleeping     Social History   Socioeconomic History  . Marital status: Single    Spouse name: Not on file  . Number of children: Not on file  . Years of education: Not on file  . Highest education level: Not on file  Occupational History  . Occupation: Retired  Scientific laboratory technician  . Financial resource strain: Not on file  . Food insecurity:    Worry: Not on file    Inability: Not on file  . Transportation needs:    Medical: Not on file    Non-medical: Not on file  Tobacco Use  . Smoking status: Former Smoker    Packs/day: 0.10    Years: 25.00    Pack years: 2.50    Types: Cigarettes    Last attempt to quit: 10/31/1978    Years since quitting: 40.1  . Smokeless tobacco: Never Used  . Tobacco comment: smoked 2-3 cigarettes/day  Substance and Sexual Activity  . Alcohol use: No    Comment: previously drank heavily but not in many years  . Drug use: No  . Sexual activity: Not on file  Lifestyle  . Physical activity:    Days per week: Not on file    Minutes per session: Not on file  . Stress: Not on file  Relationships  . Social connections:    Talks on phone: Not on file    Gets together: Not on file    Attends religious service: Not on file    Active member of club or organization: Not on file    Attends meetings of clubs or organizations: Not on file    Relationship status: Not on file  . Intimate partner violence:    Fear of current or ex partner: Not on file    Emotionally abused: Not on file    Physically abused: Not on file     Forced sexual activity: Not on file  Other Topics Concern  . Not on file  Social History Narrative   Lives in [place] by himself.  Says he is very active at home but does not routinely exercise.  Usual diet is poor - some days he might only drink a few cups of coffee.     Family History  Problem Relation Age of Onset  . Kidney failure Mother   .  Heart disease Father   . Kidney failure Father   . Heart disease Brother      ROS:  Please see the history of present illness.     All other systems reviewed and negative.    Physical Exam: Blood pressure (!) 146/73, pulse 66, temperature (!) 97.4 F (36.3 C), temperature source Oral, resp. rate 18, height 5\' 10"  (1.778 m), weight 67.2 kg, SpO2 100 %. General: Well developed, well nourished male in no acute distress. Head: Normocephalic, atraumatic, sclera non-icteric, no xanthomas, nares are without discharge. EENT: normal Lymph Nodes:  none Back: without scoliosis/kyphosis , no CVA tendersness Neck: Negative for carotid bruits. JVD not elevated. Lungs: Clear bilaterally to auscultation without wheezes, rales, or rhonchi. Breathing is unlabored. Heart: RRR with S1 S2. No murmur , rubs, or gallops appreciated. Abdomen: Soft, non-tender, non-distended with normoactive bowel sounds. No hepatomegaly. No rebound/guarding. No obvious abdominal masses. Msk:  Strength and tone appear normal for age. Extremities: No clubbing or cyanosis. No edema.  Distal pedal pulses are 2+ and equal bilaterally. Skin: Warm and Dry Neuro: Alert and oriented X 3. CN III-XII intact some L weakness and facial assymentry  Psych:  Responds to questions appropriately with a normal affect.      Labs: Cardiac Enzymes Recent Labs    12/04/18 0435 12/04/18 0840 12/04/18 1109  TROPONINI 0.51* 0.49* 0.52*   CBC Lab Results  Component Value Date   WBC 3.9 (L) 12/05/2018   HGB 10.5 (L) 12/05/2018   HCT 32.1 (L) 12/05/2018   MCV 76.4 (L) 12/05/2018   PLT 108  (L) 12/05/2018   PROTIME: Recent Labs    12/04/18 0435  LABPROT 14.0  INR 1.1   Chemistry  Recent Labs  Lab 12/04/18 0435 12/05/18 0858  NA 138 136  K 4.8 4.7  CL 114* 113*  CO2 18* 18*  BUN 47* 50*  CREATININE 2.86* 2.76*  CALCIUM 8.9 8.6*  PROT 7.0  --   BILITOT 0.7  --   ALKPHOS 93  --   ALT 52*  --   AST 31  --   GLUCOSE 121* 171*   Lipids Lab Results  Component Value Date   CHOL 102 12/05/2018   HDL 48 12/05/2018   LDLCALC 46 12/05/2018   TRIG 40 12/05/2018   BNP No results found for: PROBNP Thyroid Function Tests: Recent Labs    12/04/18 0840  TSH 0.018*      Miscellaneous No results found for: DDIMER  Radiology/Studies:  Ct Head Wo Contrast  Result Date: 12/04/2018 CLINICAL DATA:  68 year old male with slurred speech. EXAM: CT HEAD WITHOUT CONTRAST TECHNIQUE: Contiguous axial images were obtained from the base of the skull through the vertex without intravenous contrast. COMPARISON:  Head CT dated 07/14/2006 FINDINGS: Brain: The ventricles and sulci are appropriate size for patient's age. Mild periventricular and deep white matter chronic microvascular ischemic changes noted. There is an old area of infarct and encephalomalacia involving the inferior left temporal lobe. No acute intracranial hemorrhage. No mass effect or midline shift. No extra-axial fluid collection. Vascular: No hyperdense vessel or unexpected calcification. Skull: Normal. Negative for fracture or focal lesion. Sinuses/Orbits: No acute finding. Other: Phthisis bulbi of the left globe. The visualized paranasal sinuses and mastoid air cells are clear. IMPRESSION: 1. No acute intracranial hemorrhage. 2. Mild age-related atrophy and chronic microvascular ischemic changes. 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 Electronically Signed   By: Anner Crete M.D.   On: 12/04/2018 05:53   Mr Brain  Wo Contrast  Result Date: 12/04/2018 CLINICAL DATA:  Left arm weakness and numbness, facial droop, and  slurred speech. EXAM: MRI HEAD WITHOUT CONTRAST TECHNIQUE: Multiplanar, multiecho pulse sequences of the brain and surrounding structures were obtained without intravenous contrast. COMPARISON:  12/04/2018 head CT FINDINGS: Brain: There is no evidence of acute infarct, mass, midline shift, or extra-axial fluid collection. A chronic microhemorrhage is noted in the right superior frontal gyrus. Encephalomalacia anteriorly in the left temporal lobe may be post ischemic or posttraumatic. Chronic lacunar infarcts are present in the thalami, bilateral cerebral white matter, left lentiform nucleus/external capsule, right pons, and possibly left cerebellum. Patchy T2 hyperintensities in the cerebral white matter bilaterally are nonspecific but compatible with moderate chronic small vessel ischemic disease. There is mild-to-moderate cerebral atrophy. Vascular: Major intracranial vascular flow voids are preserved. Skull and upper cervical spine: Unremarkable bone marrow signal. Sinuses/Orbits: Right cataract extraction. Left phthisis bulbi. Small bilateral mastoid effusions. Clear paranasal sinuses. Other: None. IMPRESSION: 1. No acute intracranial abnormality. 2. Moderate chronic small vessel ischemic disease with numerous chronic lacunar infarcts as above. 3. 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 Electronically Signed   By: Logan Bores M.D.   On: 12/04/2018 08:41   Mr Cervical Spine Wo Contrast  Result Date: 12/04/2018 CLINICAL DATA:  68 y/o M; recurrent episodes of left arm numbness and weakness. EXAM: MRI CERVICAL SPINE WITHOUT CONTRAST TECHNIQUE: Multiplanar, multisequence MR imaging of the cervical spine was performed. No intravenous contrast was administered. COMPARISON:  07/15/2006 neck radiographs. FINDINGS: Alignment: Physiologic. Vertebrae: Mild edema within the odontoid process and small soft tissue pannus posterior to the odontoid process. Cord: Normal signal  and morphology. Posterior Fossa, vertebral arteries, paraspinal tissues: Negative. Disc levels: C2-3: Small right foraminal disc protrusion with mild right foraminal stenosis. No spinal canal stenosis. C3-4: Disc osteophyte complex with bilateral uncovertebral and facet hypertrophy resulting in moderate right and mild left foraminal stenosis. No significant spinal canal stenosis. C4-5: Disc osteophyte complex with left-greater-than-right uncovertebral and facet hypertrophy. Moderate right and moderate to severe left foraminal stenosis. There is a small central protrusion contacts the anterior cord and results in mild spinal canal stenosis. C5-6: Disc osteophyte complex with central disc protrusion as well as bilateral uncovertebral and facet hypertrophy. Moderate bilateral foraminal stenosis. The disc protrusion contacts the anterior cord and results in mild spinal canal stenosis. C6-7: Disc osteophyte complex with left-greater-than-right uncovertebral and facet hypertrophy. Mild right and moderate left neural foraminal stenosis. No significant spinal canal stenosis. C7-T1: No significant disc displacement, foraminal stenosis, or canal stenosis. IMPRESSION: 1. Mild edema within the odontoid process and small soft tissue pannus posterior to the odontoid process. Findings may reflect rheumatoid or crystal deposition disease. 2. No additional osseous or cord signal abnormality. 3. Cervical spondylosis with predominant discogenic degenerative changes greatest at the C4-5 and C5-6 levels. 4. Moderate to severe left C4-5 foraminal stenosis. Multilevel mild and moderate foraminal stenosis. 5. Mild C4-5 and C5-6 spinal canal stenosis. No high-grade spinal canal stenosis. Electronically Signed   By: Kristine Garbe M.D.   On: 12/04/2018 17:14   US Renal  Result Date: 12/04/2018 CLINICAL DATA:  Acute renal injury EXAM: RENAL / URINARY TRACT ULTRASOUND COMPLETE COMPARISON:  06/29/2017 FINDINGS: Right Kidney: Renal  measurements: 9.6 x 5.8 x 5.0 cm = volume: 147 mL . Echogenicity within normal limits. No mass or hydronephrosis visualized. Left Kidney: Renal measurements: 9.4 x 5.4 x 5.4 cm. = volume: 145 mL. No hydronephrosis is noted. There  is a 1.6 cm cyst in the lower pole of the left kidney. This is stable from the prior exam. Bladder: Mild thickening of the bladder wall is noted although this is felt to be related to incomplete distension. IMPRESSION: Stable left renal cyst.  No acute abnormality noted. Electronically Signed   By: Inez Catalina M.D.   On: 12/04/2018 07:44   US Carotid Bilateral (at [facility] Only)  Result Date: 12/04/2018 CLINICAL DATA:  CVA EXAM: BILATERAL CAROTID DUPLEX ULTRASOUND TECHNIQUE: Pearline Cables scale imaging, color Doppler and duplex ultrasound were performed of bilateral carotid and vertebral arteries in the neck. COMPARISON:  09/14/2016 FINDINGS: Criteria: Quantification of carotid stenosis is based on velocity parameters that correlate the residual internal carotid diameter with NASCET-based stenosis levels, using the diameter of the distal internal carotid lumen as the denominator for stenosis measurement. The following velocity measurements were obtained: RIGHT ICA: 84 cm/sec CCA: 65 cm/sec SYSTOLIC ICA/CCA RATIO:  1.3 ECA: 73 cm/sec LEFT ICA: 56 cm/sec CCA: 66 cm/sec SYSTOLIC ICA/CCA RATIO:  0.9 ECA: 53 cm/sec RIGHT CAROTID ARTERY: Little if any plaque in the bulb. Low resistance internal carotid Doppler pattern is preserved. RIGHT VERTEBRAL ARTERY:  Antegrade. LEFT CAROTID ARTERY: Little if any plaque in the bulb. Low resistance internal carotid Doppler pattern is preserved. LEFT VERTEBRAL ARTERY:  Antegrade. IMPRESSION: Less than 50% stenosis in the right and left internal carotid arteries. Electronically Signed   By: Marybelle Killings M.D.   On: 12/04/2018 14:52   Dg Chest Port 1 View  Result Date: 12/04/2018 CLINICAL DATA:  68 year old male with weakness. EXAM: PORTABLE CHEST 1 VIEW  COMPARISON:  Chest radiograph dated 05/19/2017 FINDINGS: Slight eventration of the right hemidiaphragm. No focal consolidation, pleural effusion, or pneumothorax. Probable area of interstitial coarsening or scarring in the right upper lobe. Stable cardiac silhouette. No acute osseous pathology. A 3 mm rounded radiopaque/metallic density over the T1 similar to prior radiograph. IMPRESSION: No active disease. Electronically Signed   By: Anner Crete M.D.   On: 12/04/2018 04:28    EKG: 3/1 sinus rhythm at 85 Intervals 18/11/45 T wave inversions 1, L, V 4-V6  2/29 ECGs at 0900--1000 sinus rhythm with right bundle branch block left posterior fascicular block  9/18 sinus rhythm with normal intervals Assessment and Plan:  Troponin elevation-borderline-unchanging over time  Bifascicular block-transient  ST-T changes  Stroke-multiple  Orthostatic intolerance  Hypertension  Diabetes  Renal insufficiency  The patient has borderline elevated troponins.  This occurs in the context of a stroke, and renal insufficiency.  ECG changes may well be ascribed to the transient bifascicular block.     O/w will sign off call for further questions       His troponin elevation is nonspecific   Cameron Adams

## 2018-12-05 NOTE — Progress Notes (Signed)
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  He is able to move his eyes in synchrony.

## 2018-12-05 NOTE — Progress Notes (Signed)
ANTICOAGULATION CONSULT NOTE - Initial Consult  Pharmacy Consult for Heparin Indication: chest pain/ACS  Allergies  Allergen Reactions  . Gabapentin     Other reaction(s): Other (See Comments) Trouble sleeping     Patient Measurements: Height: 5\' 10"  (177.8 cm) Weight: 145 lb (65.8 kg) IBW/kg (Calculated) : 73 Heparin Dosing Weight: 65.8 kg  Vital Signs: Temp: 97.7 F (36.5 C) (02/29 1944) Temp Source: Oral (02/29 1944) BP: 171/82 (02/29 1944) Pulse Rate: 68 (02/29 1944)  Labs: Recent Labs    12/04/18 0435 12/04/18 0840 12/04/18 1109 12/04/18 1752 12/05/18 0004  HGB 11.6*  --   --   --   --   HCT 35.4*  --   --   --   --   PLT 118*  --   --   --   --   APTT 33  --   --   --   --   LABPROT 14.0  --   --   --   --   INR 1.1  --   --   --   --   HEPARINUNFRC  --   --   --  0.52 0.44  CREATININE 2.86*  --   --   --   --   TROPONINI 0.51* 0.49* 0.52*  --   --     Estimated Creatinine Clearance: 23 mL/min (A) (by C-G formula based on SCr of 2.86 mg/dL (H)).     Assessment: 68 yo male here with elevated troponin to start on heparin drip.  Pt is currently therapeutic @ HL = 0.52  aPTT 33, INR 1.1  Goal of Therapy:  Heparin level 0.3-0.7 units/ml Monitor platelets by anticoagulation protocol: Yes   Plan:  03/01 @ 0000 HL 0.44 therapeutic. Will continue current rate and will recheck w/ am labs.  Tobie Lords, PharmD, BCPS Clinical Pharmacist 12/05/2018 1:55 AM

## 2018-12-05 NOTE — Progress Notes (Signed)
Found on toilet with AMS and inability to use his right side.  RRT called.  Returned patient to bed.  Code Stroke called after NP spoke with Dr. Doy Mince.  Patient transferred to ICU following CT scan.

## 2018-12-05 NOTE — Significant Event (Signed)
Rapid Response Event Note  Overview: Time Called: 1821 Arrival Time: 1823 Event Type: Neurologic  Initial Focused Assessment: called for rapid response in room 247. Pt unresponsive on toilet. Originally admitted for Stroke symptoms which had previously resolved, now exhibiting right sided weakness and slurred speech again.    Interventions: Hinton Dyer, NP spoke with Dr Doy Mince, and code Stroke was initiated. Pt taken directly to CT, and then on to ICU 8.  Plan of Care (if not transferred):  Event Summary: Name of Physician Notified: Marda Stalker, NP at [phone]  Name of Consulting Physician Notified: Alexis Goodell, MD at 1923  Outcome: Transferred (Comment)(code stroke- tx ICU)  Event End Time: Center Line A

## 2018-12-05 NOTE — Progress Notes (Signed)
Subjective: Patient reports no further recurrent of his weakness.  Stable overnight.  Objective: Current vital signs: BP (!) 88/53 (BP Location: Left Arm)   Pulse 68   Temp (!) 97.4 F (36.3 C) (Oral)   Resp 18   Ht 5\' 10"  (1.778 m)   Wt 67.2 kg   SpO2 100%   BMI 21.26 kg/m  Vital signs in last 24 hours: Temp:  [97.4 F (36.3 C)-98.5 F (36.9 C)] 97.4 F (36.3 C) (03/01 0857) Pulse Rate:  [67-82] 68 (03/01 0857) Resp:  [12-22] 18 (03/01 0857) BP: (88-207)/(53-101) 88/53 (03/01 0857) SpO2:  [94 %-100 %] 100 % (03/01 0857) Weight:  [67.2 kg] 67.2 kg (03/01 0407)  Intake/Output from previous day: 02/29 0701 - 03/01 0700 In: 24.6 [I.V.:24.6] Out: 300 [Urine:300] Intake/Output this shift: No intake/output data recorded. Nutritional status:  Diet Order            Diet Heart Room service appropriate? Yes; Fluid consistency: Thin  Diet effective now              Neurologic Exam: Mental Status: Alert, oriented, thought content appropriate.  Speech fluent without evidence of aphasia.  Mils dysarthria noted but patient without teeth.  Able to follow 3 step commands without difficulty. Cranial Nerves: II: Right disc flat; Visual fields grossly normal, right pupil reactive to light  III,IV, VI: ptosis not present, extra-ocular motions intact bilaterally V,VII: mild left facial droop, facial light touch sensation normal bilaterally VIII: hearing normal bilaterally IX,X: gag reflex present XI: bilateral shoulder shrug XII: midline tongue extension Motor: 5/5 throughout  Lab Results: Basic Metabolic Panel: Recent Labs  Lab 12/04/18 0435 12/04/18 0840  NA 138  --   K 4.8  --   CL 114*  --   CO2 18*  --   GLUCOSE 121*  --   BUN 47*  --   CREATININE 2.86*  --   CALCIUM 8.9  --   MG  --  1.4*  PHOS  --  4.1    Liver Function Tests: Recent Labs  Lab 12/04/18 0435  AST 31  ALT 52*  ALKPHOS 93  BILITOT 0.7  PROT 7.0  ALBUMIN 3.7   No results for input(s):  LIPASE, AMYLASE in the last 168 hours. No results for input(s): AMMONIA in the last 168 hours.  CBC: Recent Labs  Lab 12/04/18 0435 12/05/18 0453  WBC 4.3 3.9*  NEUTROABS 2.6  --   HGB 11.6* 10.5*  HCT 35.4* 32.1*  MCV 75.6* 76.4*  PLT 118* 108*    Cardiac Enzymes: Recent Labs  Lab 12/04/18 0435 12/04/18 0840 12/04/18 1109  TROPONINI 0.51* 0.49* 0.52*    Lipid Panel: Recent Labs  Lab 12/05/18 0453  CHOL 102  TRIG 40  HDL 48  CHOLHDL 2.1  VLDL 8  LDLCALC 46    CBG: Recent Labs  Lab 12/04/18 1157  GLUCAP 91    Microbiology: Results for orders placed or performed during the hospital encounter of 11/02/18  C difficile quick scan w PCR reflex     Status: None   Collection Time: 11/02/18 12:20 PM  Result Value Ref Range Status   C Diff antigen NEGATIVE NEGATIVE Final   C Diff toxin NEGATIVE NEGATIVE Final   C Diff interpretation No C. difficile detected.  Final    Comment: Performed at [facility], [address] [place], [place] [address]  Gastrointestinal Panel by PCR , Stool     Status: None   Collection  Time: 11/02/18 12:20 PM  Result Value Ref Range Status   Campylobacter species NOT DETECTED NOT DETECTED Final   Plesimonas shigelloides NOT DETECTED NOT DETECTED Final   Salmonella species NOT DETECTED NOT DETECTED Final   Yersinia enterocolitica NOT DETECTED NOT DETECTED Final   Vibrio species NOT DETECTED NOT DETECTED Final   Vibrio cholerae NOT DETECTED NOT DETECTED Final   Enteroaggregative E coli (EAEC) NOT DETECTED NOT DETECTED Final   Enteropathogenic E coli (EPEC) NOT DETECTED NOT DETECTED Final   Enterotoxigenic E coli (ETEC) NOT DETECTED NOT DETECTED Final   Shiga like toxin producing E coli (STEC) NOT DETECTED NOT DETECTED Final   Shigella/Enteroinvasive E coli (EIEC) NOT DETECTED NOT DETECTED Final   Cryptosporidium NOT DETECTED NOT DETECTED Final   Cyclospora cayetanensis NOT DETECTED NOT DETECTED Final   Entamoeba  histolytica NOT DETECTED NOT DETECTED Final   Giardia lamblia NOT DETECTED NOT DETECTED Final   Adenovirus F40/41 NOT DETECTED NOT DETECTED Final   Astrovirus NOT DETECTED NOT DETECTED Final   Norovirus GI/GII NOT DETECTED NOT DETECTED Final   Rotavirus A NOT DETECTED NOT DETECTED Final   Sapovirus (I, II, IV, and V) NOT DETECTED NOT DETECTED Final    Comment: Performed at [facility], [address] [place], [place] [address]    Coagulation Studies: Recent Labs    12/04/18 0435  LABPROT 14.0  INR 1.1    Imaging: Ct Head Wo Contrast  Result Date: 12/04/2018 CLINICAL DATA:  68 year old male with slurred speech. EXAM: CT HEAD WITHOUT CONTRAST TECHNIQUE: Contiguous axial images were obtained from the base of the skull through the vertex without intravenous contrast. COMPARISON:  Head CT dated 07/14/2006 FINDINGS: Brain: The ventricles and sulci are appropriate size for patient's age. Mild periventricular and deep white matter chronic microvascular ischemic changes noted. There is an old area of infarct and encephalomalacia involving the inferior left temporal lobe. No acute intracranial hemorrhage. No mass effect or midline shift. No extra-axial fluid collection. Vascular: No hyperdense vessel or unexpected calcification. Skull: Normal. Negative for fracture or focal lesion. Sinuses/Orbits: No acute finding. Other: Phthisis bulbi of the left globe. The visualized paranasal sinuses and mastoid air cells are clear. IMPRESSION: 1. No acute intracranial hemorrhage. 2. Mild age-related atrophy and chronic microvascular ischemic changes. 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 Electronically Signed   By: Anner Crete M.D.   On: 12/04/2018 05:53   [name] Contrast  Result Date: 12/04/2018 CLINICAL DATA:  Left arm weakness and numbness, facial droop, and slurred speech. EXAM: MRI HEAD WITHOUT CONTRAST TECHNIQUE: Multiplanar, multiecho pulse sequences of the brain and surrounding structures  were obtained without intravenous contrast. COMPARISON:  12/04/2018 head CT FINDINGS: Brain: There is no evidence of acute infarct, mass, midline shift, or extra-axial fluid collection. A chronic microhemorrhage is noted in the right superior frontal gyrus. Encephalomalacia anteriorly in the left temporal lobe may be post ischemic or posttraumatic. Chronic lacunar infarcts are present in the thalami, bilateral cerebral white matter, left lentiform nucleus/external capsule, right pons, and possibly left cerebellum. Patchy T2 hyperintensities in the cerebral white matter bilaterally are nonspecific but compatible with moderate chronic small vessel ischemic disease. There is mild-to-moderate cerebral atrophy. Vascular: Major intracranial vascular flow voids are preserved. Skull and upper cervical spine: Unremarkable bone marrow signal. Sinuses/Orbits: Right cataract extraction. Left phthisis bulbi. Small bilateral mastoid effusions. Clear paranasal sinuses. Other: None. IMPRESSION: 1. No acute intracranial abnormality. 2. Moderate chronic small vessel ischemic disease with numerous chronic lacunar infarcts as  above. 3. 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 Electronically Signed   By: Logan Bores M.D.   On: 12/04/2018 08:41   Mr Cervical Spine Wo Contrast  Result Date: 12/04/2018 CLINICAL DATA:  68 y/o M; recurrent episodes of left arm numbness and weakness. EXAM: MRI CERVICAL SPINE WITHOUT CONTRAST TECHNIQUE: Multiplanar, multisequence MR imaging of the cervical spine was performed. No intravenous contrast was administered. COMPARISON:  07/15/2006 neck radiographs. FINDINGS: Alignment: Physiologic. Vertebrae: Mild edema within the odontoid process and small soft tissue pannus posterior to the odontoid process. Cord: Normal signal and morphology. Posterior Fossa, vertebral arteries, paraspinal tissues: Negative. Disc levels: C2-3: Small right foraminal disc protrusion  with mild right foraminal stenosis. No spinal canal stenosis. C3-4: Disc osteophyte complex with bilateral uncovertebral and facet hypertrophy resulting in moderate right and mild left foraminal stenosis. No significant spinal canal stenosis. C4-5: Disc osteophyte complex with left-greater-than-right uncovertebral and facet hypertrophy. Moderate right and moderate to severe left foraminal stenosis. There is a small central protrusion contacts the anterior cord and results in mild spinal canal stenosis. C5-6: Disc osteophyte complex with central disc protrusion as well as bilateral uncovertebral and facet hypertrophy. Moderate bilateral foraminal stenosis. The disc protrusion contacts the anterior cord and results in mild spinal canal stenosis. C6-7: Disc osteophyte complex with left-greater-than-right uncovertebral and facet hypertrophy. Mild right and moderate left neural foraminal stenosis. No significant spinal canal stenosis. C7-T1: No significant disc displacement, foraminal stenosis, or canal stenosis. IMPRESSION: 1. Mild edema within the odontoid process and small soft tissue pannus posterior to the odontoid process. Findings may reflect rheumatoid or crystal deposition disease. 2. No additional osseous or cord signal abnormality. 3. Cervical spondylosis with predominant discogenic degenerative changes greatest at the C4-5 and C5-6 levels. 4. Moderate to severe left C4-5 foraminal stenosis. Multilevel mild and moderate foraminal stenosis. 5. Mild C4-5 and C5-6 spinal canal stenosis. No high-grade spinal canal stenosis. Electronically Signed   By: Kristine Garbe M.D.   On: 12/04/2018 17:14   US Renal  Result Date: 12/04/2018 CLINICAL DATA:  Acute renal injury EXAM: RENAL / URINARY TRACT ULTRASOUND COMPLETE COMPARISON:  06/29/2017 FINDINGS: Right Kidney: Renal measurements: 9.6 x 5.8 x 5.0 cm = volume: 147 mL . Echogenicity within normal limits. No mass or hydronephrosis visualized. Left Kidney:  Renal measurements: 9.4 x 5.4 x 5.4 cm. = volume: 145 mL. No hydronephrosis is noted. There is a 1.6 cm cyst in the lower pole of the left kidney. This is stable from the prior exam. Bladder: Mild thickening of the bladder wall is noted although this is felt to be related to incomplete distension. IMPRESSION: Stable left renal cyst.  No acute abnormality noted. Electronically Signed   By: Inez Catalina M.D.   On: 12/04/2018 07:44   US Carotid Bilateral (at [facility] Only)  Result Date: 12/04/2018 CLINICAL DATA:  CVA EXAM: BILATERAL CAROTID DUPLEX ULTRASOUND TECHNIQUE: Pearline Cables scale imaging, color Doppler and duplex ultrasound were performed of bilateral carotid and vertebral arteries in the neck. COMPARISON:  09/14/2016 FINDINGS: Criteria: Quantification of carotid stenosis is based on velocity parameters that correlate the residual internal carotid diameter with NASCET-based stenosis levels, using the diameter of the distal internal carotid lumen as the denominator for stenosis measurement. The following velocity measurements were obtained: RIGHT ICA: 84 cm/sec CCA: 65 cm/sec SYSTOLIC ICA/CCA RATIO:  1.3 ECA: 73 cm/sec LEFT ICA: 56 cm/sec CCA: 66 cm/sec SYSTOLIC ICA/CCA RATIO:  0.9 ECA: 53 cm/sec RIGHT CAROTID ARTERY:  Little if any plaque in the bulb. Low resistance internal carotid Doppler pattern is preserved. RIGHT VERTEBRAL ARTERY:  Antegrade. LEFT CAROTID ARTERY: Little if any plaque in the bulb. Low resistance internal carotid Doppler pattern is preserved. LEFT VERTEBRAL ARTERY:  Antegrade. IMPRESSION: Less than 50% stenosis in the right and left internal carotid arteries. Electronically Signed   By: Marybelle Killings M.D.   On: 12/04/2018 14:52   Dg Chest Port 1 View  Result Date: 12/04/2018 CLINICAL DATA:  68 year old male with weakness. EXAM: PORTABLE CHEST 1 VIEW COMPARISON:  Chest radiograph dated 05/19/2017 FINDINGS: Slight eventration of the right hemidiaphragm. No focal consolidation, pleural  effusion, or pneumothorax. Probable area of interstitial coarsening or scarring in the right upper lobe. Stable cardiac silhouette. No acute osseous pathology. A 3 mm rounded radiopaque/metallic density over the T1 similar to prior radiograph. IMPRESSION: No active disease. Electronically Signed   By: Anner Crete M.D.   On: 12/04/2018 04:28    Medications:  I have reviewed the patient's current medications. Scheduled: .  stroke: mapping our early stages of recovery book   Does not apply Once  . amLODipine  5 mg Oral Daily  . aspirin  81 mg Oral Daily  . atorvastatin  40 mg Oral QHS  . cholecalciferol  1,000 Units Oral Daily  . citalopram  20 mg Oral Daily  . furosemide  80 mg Oral Daily  . glipiZIDE  2.5 mg Oral QAC breakfast  . [START ON 12/06/2018] levothyroxine  100 mcg Oral QAC breakfast  . lipase/protease/amylase  72,000 Units Oral TID WC  . sodium chloride flush  3 mL Intravenous Q12H    Assessment/Plan: Patient without further events.  Neurological examination at baseline.  MRI of the brain reviewed and shows no acute changes.  Carotid dopplers show no evidence of hemodynamically significant stenosis.  Echocardiogram pending.  A1c 7.2, LDL 46. MRI of the cervical spine shows degenerative changes and some mild spinal stenosis but nothing to explain the patient's presenting symptoms.  Do not suspect TIA.  Patient on ASA and statin at home.  Suspect symptoms related to OH (lying BP 88/53) and thyroid issues (TSH low).    Recommendations: 1. Agree with addressing medical issues 2. EEG pending 3. Echo results pending 4. Continue ASA and statin 5. If above testing unremarkable no neurologic intervention is recommended at this time.  If further questions arise, please call or page at that time.  Thank you for allowing neurology to participate in the care of this patient.    LOS: 1 day   Alexis Goodell, MD Neurology [phone] 12/05/2018  9:16 AM

## 2018-12-05 NOTE — Progress Notes (Signed)
*  PRELIMINARY RESULTS* Echocardiogram 2D Echocardiogram has been performed.  Cameron Adams 12/05/2018, 11:41 AM

## 2018-12-05 NOTE — Progress Notes (Signed)
PT Cancellation Note  Patient Details Name: Cameron Adams MRN: [id number] DOB: 1951/05/26   Cancelled Treatment:    Reason Eval/Treat Not Completed: Medical issues which prohibited therapy. Will hold PT due to elevated troponin of 0.52 as of 2/29 and trending up, cardiology consult in place. Will continue to monitor acutely and treat as appropriate with POC. If medically stable for PT to see, pt will need bed rest orders discontinued or a new activity order placed.   Dorothy Spark, SPT  12/05/2018, 9:11 AM

## 2018-12-05 NOTE — H&P (Signed)
Code stroke called for this patient who was going to the restroom and had sudden onset of left-sided weakness including facial droop and left arm weakness.  Patient continues to have left sided facial droop and left arm weakness. Currently he is not speaking.  Neurology made aware Head CT scan stat ordered Patient to be transferred to ICU.   Patient had work-up earlier during this hospital stay due to similar symptoms and MRI has been negative.  Critical care 30 minutes

## 2018-12-05 NOTE — Consult Note (Signed)
CODE STROKE- PHARMACY COMMUNICATION   Time CODE STROKE called/page received: 1833  Time response to CODE STROKE was made (in person or via phone): 1839  Time Stroke Kit retrieved from [place] (only if needed): Plan is no tpa at this time per Domingo Pulse  Name of Provider/Nurse contacted: Spoke with Nurse in ICU - pt just went to room 8 ICU - Currently in CT  Past Medical History:  Diagnosis Date  . Anemia of chronic disease   . Blindness of left eye   . Chronic kidney disease, stage III (moderate) (HCC)   . Diabetes ([place])   . Diarrhea   . Diastolic dysfunction    a. 06/2017 Echo: EF 55-60%, no rwma, Gr2 DD, Asc Ao 3.9cm, nl RV fxn, nl PASP.  Marland Kitchen Hypertension   . Hypothyroidism   . Monoclonal gammopathy of unknown significance ([facility])    a. prev seen @ [facility].  Lost to f/u - never had BM bx.  . Orthostatic hypotension   . Syncope and collapse    Prior to Admission medications   Medication Sig Start Date End Date Taking? Authorizing Provider  amLODipine (NORVASC) 5 MG tablet Take 5 mg by mouth daily.  09/15/18  Yes [provider]  aspirin EC 81 MG tablet Take 81 mg by mouth daily.   Yes [provider]  atorvastatin (LIPITOR) 40 MG tablet Take 40 mg by mouth at bedtime.   Yes [provider]  Cholecalciferol (VITAMIN D-1000 MAX ST) 25 MCG (1000 UT) tablet Take 2,000 Units by mouth daily.    Yes [provider]  citalopram (CELEXA) 20 MG tablet Take 20 mg by mouth daily.   Yes [provider]  furosemide (LASIX) 80 MG tablet Take 80 mg by mouth.  09/22/18  Yes [provider]  glipiZIDE (GLUCOTROL) 5 MG tablet Take 2.5 mg by mouth daily before breakfast.  11/26/18  Yes [provider]  hydroxypropyl methylcellulose / hypromellose (ISOPTO TEARS / GONIOVISC) 2.5 % ophthalmic solution Place 1 drop into both eyes 3 (three) times daily as needed for dry eyes.   Yes [provider]  levothyroxine (SYNTHROID, LEVOTHROID) 200  MCG tablet Take 1 tablet (200 mcg total) by mouth daily before breakfast. 05/21/17  Yes Vaughan Basta, MD  lipase/protease/amylase (CREON) 36000 UNITS CPEP capsule Take 2 capsules (72,000 Units total) by mouth 3 (three) times daily with meals. 06/29/17  Yes Vaughan Basta, MD  loperamide (IMODIUM) 2 MG capsule Take 1 capsule (2 mg total) by mouth every 6 (six) hours as needed for diarrhea or loose stools. 06/29/17  Yes Vaughan Basta, MD    Lu Duffel, PharmD, BCPS Clinical Pharmacist 12/05/2018 7:29 PM

## 2018-12-05 NOTE — Consult Note (Signed)
[facility] TeleNeurology Consult Services   Date of Service:   12/05/2018 18:46:35  Impression:     .  Rule Out Acute Ischemic Stroke  Comments/Sign-Out: the patient has had a multitude of fluctuating neurologic symptomsin the context of acute on chronic renal failure. He is hypertensive. Certainly cannot exclude a new ischemic events, however the patient has been on a heparin drip for his non-ST elevation MI concerned with elevated troponin. Given his impaired kidney function however it may be related to that. Suspect this might be more toxic metabolic in nature at the present moment he does not have any focality he seems very encephalopathic and certainly can't exclude aphasia. Discussed with primary team obtaining a stat MRI of the brain. Given his clinical history and acute renal failure the risk of obtaining contrast-enhanced studies outweighs the benefit.another possibility is of course seizure. He has multifocal prior strokes.  Metrics: Last Known Well: 12/05/2018 18:21:00 [facility] Notification Time: 12/05/2018 18:46:35 Stamp Time: 12/05/2018 18:46:35 Time First Login Attempt: 12/05/2018 18:51:56 Video Start Time: 12/05/2018 18:51:56  Symptoms: sided weakness confusion and aphasia NIHSS Start Assessment Time: 12/05/2018 18:55:00 Patient is not a candidate for tPA. Patient was not deemed candidate for tPA thrombolytics because of Coagulopathy. Video End Time: 12/05/2018 19:07:26  CT head showed no acute hemorrhage or acute core infarct.  Clinical Presentation is not Suggestive of Large Vessel Occlusive Disease, Patient is not a Candidate for Thrombectomy  Radiologist was not called back for review of advanced imaging because na  Our recommendations are outlined below.  Recommendations:     .  Activate Stroke Protocol Admission/Order Set     .  Stroke/Telemetry Floor     .  Neuro Checks     .  Bedside Swallow Eval     .  DVT Prophylaxis     .  IV Fluids, Normal  Saline     .  Head of Bed Below 30 Degrees     .  Euglycemia and Avoid Hyperthermia (PRN Acetaminophen)     .  he may continue aspirin therapy hold heparin for now   Sign Out:     .  Discussed with Emergency Department Provider     .  Discussed with Primary Attending     .  Discussed with Rapid Response Team    ------------------------------------------------------------------------------  History of Present Illness: Patient is a 68 year old Male.  Inpatient stroke alert was called for symptoms of sided weakness confusion and aphasia  Has HTN, CKD, blood in left eye, DM. A New right sided weakness/numbness admitted with left sided weakness yesterday. NTEMI concern so on heparin drip. the patient was actually admitted with left-sided weakness and had an MRI of the brain that did not show any acute changes has multifocal chronic appearing strokes on his imaging is unclear if he has any residual deficit from that. His creatinine is 2.7.  CT head showed no acute hemorrhage or acute core infarct.   Examination: BP(197/107), Pulse(82), 1A: Level of Consciousness - Arouses to minor stimulation + 1 1B: Ask Month and Age - Could Not Answer Either Question Correctly + 2 1C: Blink Eyes & Squeeze Hands - Performs Both Tasks + 0 2: Test Horizontal Extraocular Movements - Normal + 0 3: Test Visual Fields - No Visual Loss + 0 4: Test Facial Palsy (Use Grimace if Obtunded) - Normal symmetry + 0 5A: Test Left Arm Motor Drift - No Drift for 10 Seconds + 0 5B: Test Right Arm Motor Drift -  No Drift for 10 Seconds + 0 6A: Test Left Leg Motor Drift - No Drift for 5 Seconds + 0 6B: Test Right Leg Motor Drift - No Drift for 5 Seconds + 0 7: Test Limb Ataxia (FNF/Heel-Shin) - No Ataxia + 0 8: Test Sensation - Normal; No sensory loss + 0 9: Test Language/Aphasia - Mild-Moderate Aphasia: Some Obvious Changes, Without Significant Limitation + 1 10: Test Dysarthria - Normal + 0 11: Test  Extinction/Inattention - No abnormality + 0  NIHSS Score: 4  Patient was informed the Neurology Consult would happen via TeleHealth consult by way of interactive audio and video telecommunications and consented to receiving care in this manner.  Due to the immediate potential for life-threatening deterioration due to underlying acute neurologic illness, I spent 35 minutes providing critical care. This time includes time for face to face visit via telemedicine, review of medical records, imaging studies and discussion of findings with providers, the patient and/or family.   Dr Katina Degree   [facility] [phone]

## 2018-12-05 NOTE — Plan of Care (Signed)
  Problem: Education: Goal: Knowledge of secondary prevention will improve Outcome: Progressing

## 2018-12-05 NOTE — Plan of Care (Signed)
Positive response to education.

## 2018-12-05 NOTE — Progress Notes (Signed)
Patient has loss of sensory in his lower legs. He says this is not new and due to neuropathy.

## 2018-12-05 NOTE — Progress Notes (Signed)
PT Cancellation Note  Patient Details Name: Cameron Adams MRN: [id number] DOB: 03/16/1951   Cancelled Treatment:    Reason Eval/Treat Not Completed: Medical issues which prohibited therapy(Patient noted with transfer to CCU after rapid response episode this date.  Per guidelines, will require new orders to resume PT services after transfer to higher level of care. Please reconsult as medically appropriate.)   Shawntrice Salle H. Owens Shark, PT, DPT, NCS 12/05/18, 10:43 PM [phone]

## 2018-12-05 NOTE — Progress Notes (Signed)
ANTICOAGULATION CONSULT NOTE - Initial Consult  Pharmacy Consult for Heparin Indication: chest pain/ACS  Allergies  Allergen Reactions  . Gabapentin     Other reaction(s): Other (See Comments) Trouble sleeping     Patient Measurements: Height: 5\' 10"  (177.8 cm) Weight: 145 lb (65.8 kg) IBW/kg (Calculated) : 73 Heparin Dosing Weight: 65.8 kg  Vital Signs: Temp: 97.6 F (36.4 C) (03/01 0407) Temp Source: Oral (03/01 0407) BP: 121/64 (03/01 0407) Pulse Rate: 70 (03/01 0407)  Labs: Recent Labs    12/04/18 0435 12/04/18 0840 12/04/18 1109 12/04/18 1752 12/05/18 0004 12/05/18 0453  HGB 11.6*  --   --   --   --  10.5*  HCT 35.4*  --   --   --   --  32.1*  PLT 118*  --   --   --   --  108*  APTT 33  --   --   --   --   --   LABPROT 14.0  --   --   --   --   --   INR 1.1  --   --   --   --   --   HEPARINUNFRC  --   --   --  0.52 0.44 0.50  CREATININE 2.86*  --   --   --   --   --   TROPONINI 0.51* 0.49* 0.52*  --   --   --     Estimated Creatinine Clearance: 23 mL/min (A) (by C-G formula based on SCr of 2.86 mg/dL (H)).     Assessment: 68 yo male here with elevated troponin to start on heparin drip.  Pt is currently therapeutic @ HL = 0.52  aPTT 33, INR 1.1  Goal of Therapy:  Heparin level 0.3-0.7 units/ml Monitor platelets by anticoagulation protocol: Yes   Plan:  03/01 @ 0500 HL 0.50 therapeutic. Will continue current rate and will recheck w/ am labs. CBC trended down, but stable will continue to monitor.  Tobie Lords, PharmD, BCPS Clinical Pharmacist 12/05/2018 6:59 AM

## 2018-12-05 NOTE — Progress Notes (Signed)
[facility] [place] NAME: Cameron Adams    MR#:  [id number]  DATE OF BIRTH:  14-Jan-1951  SUBJECTIVE:  CHIEF COMPLAINT:   Complains of diarrhea-last stool was a few days ago, neurology input greatly appreciated, consult nephrology given acute kidney injury chronic kidney disease, cardiology to see  REVIEW OF SYSTEMS:  CONSTITUTIONAL: No fever, fatigue or weakness.  EYES: No blurred or double vision.  EARS, NOSE, AND THROAT: No tinnitus or ear pain.  RESPIRATORY: No cough, shortness of breath, wheezing or hemoptysis.  CARDIOVASCULAR: No chest pain, orthopnea, edema.  GASTROINTESTINAL: No nausea, vomiting, diarrhea or abdominal pain.  GENITOURINARY: No dysuria, hematuria.  ENDOCRINE: No polyuria, nocturia,  HEMATOLOGY: No anemia, easy bruising or bleeding SKIN: No rash or lesion. MUSCULOSKELETAL: No joint pain or arthritis.   NEUROLOGIC: No tingling, numbness, weakness.  PSYCHIATRY: No anxiety or depression.   ROS  DRUG ALLERGIES:   Allergies  Allergen Reactions  . Gabapentin     Other reaction(s): Other (See Comments) Trouble sleeping     VITALS:  Blood pressure (!) 146/73, pulse 66, temperature (!) 97.4 F (36.3 C), temperature source Oral, resp. rate 18, height 5\' 10"  (1.778 m), weight 67.2 kg, SpO2 100 %.  PHYSICAL EXAMINATION:  GENERAL:  68 y.o.-year-old patient lying in the bed with no acute distress.  EYES: Pupils equal, round, reactive to light and accommodation. No scleral icterus. Extraocular muscles intact.  HEENT: Head atraumatic, normocephalic. Oropharynx and nasopharynx clear.  NECK:  Supple, no jugular venous distention. No thyroid enlargement, no tenderness.  LUNGS: Normal breath sounds bilaterally, no wheezing, rales,rhonchi or crepitation. No use of accessory muscles of respiration.  CARDIOVASCULAR: S1, S2 normal. No murmurs, rubs, or gallops.  ABDOMEN: Soft, nontender, nondistended. Bowel sounds present. No  organomegaly or mass.  EXTREMITIES: No pedal edema, cyanosis, or clubbing.  NEUROLOGIC: Cranial nerves II through XII are intact. Muscle strength 5/5 in all extremities. Sensation intact. Gait not checked.  PSYCHIATRIC: The patient is alert and oriented x 3.  SKIN: No obvious rash, lesion, or ulcer.   Physical Exam LABORATORY PANEL:   CBC Recent Labs  Lab 12/05/18 0453  WBC 3.9*  HGB 10.5*  HCT 32.1*  PLT 108*   ------------------------------------------------------------------------------------------------------------------  Chemistries  Recent Labs  Lab 12/04/18 0435 12/04/18 0840 12/05/18 0858  NA 138  --  136  K 4.8  --  4.7  CL 114*  --  113*  CO2 18*  --  18*  GLUCOSE 121*  --  171*  BUN 47*  --  50*  CREATININE 2.86*  --  2.76*  CALCIUM 8.9  --  8.6*  MG  --  1.4*  --   AST 31  --   --   ALT 52*  --   --   ALKPHOS 93  --   --   BILITOT 0.7  --   --    ------------------------------------------------------------------------------------------------------------------  Cardiac Enzymes Recent Labs  Lab 12/04/18 0840 12/04/18 1109  TROPONINI 0.49* 0.52*   ------------------------------------------------------------------------------------------------------------------  RADIOLOGY:  Ct Head Wo Contrast  Result Date: 12/04/2018 CLINICAL DATA:  68 year old male with slurred speech. EXAM: CT HEAD WITHOUT CONTRAST TECHNIQUE: Contiguous axial images were obtained from the base of the skull through the vertex without intravenous contrast. COMPARISON:  Head CT dated 07/14/2006 FINDINGS: Brain: The ventricles and sulci are appropriate size for patient's age. Mild periventricular and deep white matter chronic microvascular ischemic changes noted. There is an old area of infarct  and encephalomalacia involving the inferior left temporal lobe. No acute intracranial hemorrhage. No mass effect or midline shift. No extra-axial fluid collection. Vascular: No hyperdense vessel or  unexpected calcification. Skull: Normal. Negative for fracture or focal lesion. Sinuses/Orbits: No acute finding. Other: Phthisis bulbi of the left globe. The visualized paranasal sinuses and mastoid air cells are clear. IMPRESSION: 1. No acute intracranial hemorrhage. 2. Mild age-related atrophy and chronic microvascular ischemic changes. 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 Electronically Signed   By: Anner Crete M.D.   On: 12/04/2018 05:53   [name] Contrast  Result Date: 12/04/2018 CLINICAL DATA:  Left arm weakness and numbness, facial droop, and slurred speech. EXAM: MRI HEAD WITHOUT CONTRAST TECHNIQUE: Multiplanar, multiecho pulse sequences of the brain and surrounding structures were obtained without intravenous contrast. COMPARISON:  12/04/2018 head CT FINDINGS: Brain: There is no evidence of acute infarct, mass, midline shift, or extra-axial fluid collection. A chronic microhemorrhage is noted in the right superior frontal gyrus. Encephalomalacia anteriorly in the left temporal lobe may be post ischemic or posttraumatic. Chronic lacunar infarcts are present in the thalami, bilateral cerebral white matter, left lentiform nucleus/external capsule, right pons, and possibly left cerebellum. Patchy T2 hyperintensities in the cerebral white matter bilaterally are nonspecific but compatible with moderate chronic small vessel ischemic disease. There is mild-to-moderate cerebral atrophy. Vascular: Major intracranial vascular flow voids are preserved. Skull and upper cervical spine: Unremarkable bone marrow signal. Sinuses/Orbits: Right cataract extraction. Left phthisis bulbi. Small bilateral mastoid effusions. Clear paranasal sinuses. Other: None. IMPRESSION: 1. No acute intracranial abnormality. 2. Moderate chronic small vessel ischemic disease with numerous chronic lacunar infarcts as above. 3. 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 Electronically Signed    By: Logan Bores M.D.   On: 12/04/2018 08:41   Mr Cervical Spine Wo Contrast  Result Date: 12/04/2018 CLINICAL DATA:  68 y/o M; recurrent episodes of left arm numbness and weakness. EXAM: MRI CERVICAL SPINE WITHOUT CONTRAST TECHNIQUE: Multiplanar, multisequence MR imaging of the cervical spine was performed. No intravenous contrast was administered. COMPARISON:  07/15/2006 neck radiographs. FINDINGS: Alignment: Physiologic. Vertebrae: Mild edema within the odontoid process and small soft tissue pannus posterior to the odontoid process. Cord: Normal signal and morphology. Posterior Fossa, vertebral arteries, paraspinal tissues: Negative. Disc levels: C2-3: Small right foraminal disc protrusion with mild right foraminal stenosis. No spinal canal stenosis. C3-4: Disc osteophyte complex with bilateral uncovertebral and facet hypertrophy resulting in moderate right and mild left foraminal stenosis. No significant spinal canal stenosis. C4-5: Disc osteophyte complex with left-greater-than-right uncovertebral and facet hypertrophy. Moderate right and moderate to severe left foraminal stenosis. There is a small central protrusion contacts the anterior cord and results in mild spinal canal stenosis. C5-6: Disc osteophyte complex with central disc protrusion as well as bilateral uncovertebral and facet hypertrophy. Moderate bilateral foraminal stenosis. The disc protrusion contacts the anterior cord and results in mild spinal canal stenosis. C6-7: Disc osteophyte complex with left-greater-than-right uncovertebral and facet hypertrophy. Mild right and moderate left neural foraminal stenosis. No significant spinal canal stenosis. C7-T1: No significant disc displacement, foraminal stenosis, or canal stenosis. IMPRESSION: 1. Mild edema within the odontoid process and small soft tissue pannus posterior to the odontoid process. Findings may reflect rheumatoid or crystal deposition disease. 2. No additional osseous or cord  signal abnormality. 3. Cervical spondylosis with predominant discogenic degenerative changes greatest at the C4-5 and C5-6 levels. 4. Moderate to severe left C4-5 foraminal stenosis. Multilevel mild and moderate foraminal  stenosis. 5. Mild C4-5 and C5-6 spinal canal stenosis. No high-grade spinal canal stenosis. Electronically Signed   By: Kristine Garbe M.D.   On: 12/04/2018 17:14   US Renal  Result Date: 12/04/2018 CLINICAL DATA:  Acute renal injury EXAM: RENAL / URINARY TRACT ULTRASOUND COMPLETE COMPARISON:  06/29/2017 FINDINGS: Right Kidney: Renal measurements: 9.6 x 5.8 x 5.0 cm = volume: 147 mL . Echogenicity within normal limits. No mass or hydronephrosis visualized. Left Kidney: Renal measurements: 9.4 x 5.4 x 5.4 cm. = volume: 145 mL. No hydronephrosis is noted. There is a 1.6 cm cyst in the lower pole of the left kidney. This is stable from the prior exam. Bladder: Mild thickening of the bladder wall is noted although this is felt to be related to incomplete distension. IMPRESSION: Stable left renal cyst.  No acute abnormality noted. Electronically Signed   By: Inez Catalina M.D.   On: 12/04/2018 07:44   US Carotid Bilateral (at [facility] Only)  Result Date: 12/04/2018 CLINICAL DATA:  CVA EXAM: BILATERAL CAROTID DUPLEX ULTRASOUND TECHNIQUE: Pearline Cables scale imaging, color Doppler and duplex ultrasound were performed of bilateral carotid and vertebral arteries in the neck. COMPARISON:  09/14/2016 FINDINGS: Criteria: Quantification of carotid stenosis is based on velocity parameters that correlate the residual internal carotid diameter with NASCET-based stenosis levels, using the diameter of the distal internal carotid lumen as the denominator for stenosis measurement. The following velocity measurements were obtained: RIGHT ICA: 84 cm/sec CCA: 65 cm/sec SYSTOLIC ICA/CCA RATIO:  1.3 ECA: 73 cm/sec LEFT ICA: 56 cm/sec CCA: 66 cm/sec SYSTOLIC ICA/CCA RATIO:  0.9 ECA: 53 cm/sec RIGHT CAROTID  ARTERY: Little if any plaque in the bulb. Low resistance internal carotid Doppler pattern is preserved. RIGHT VERTEBRAL ARTERY:  Antegrade. LEFT CAROTID ARTERY: Little if any plaque in the bulb. Low resistance internal carotid Doppler pattern is preserved. LEFT VERTEBRAL ARTERY:  Antegrade. IMPRESSION: Less than 50% stenosis in the right and left internal carotid arteries. Electronically Signed   By: Marybelle Killings M.D.   On: 12/04/2018 14:52   Dg Chest Port 1 View  Result Date: 12/04/2018 CLINICAL DATA:  68 year old male with weakness. EXAM: PORTABLE CHEST 1 VIEW COMPARISON:  Chest radiograph dated 05/19/2017 FINDINGS: Slight eventration of the right hemidiaphragm. No focal consolidation, pleural effusion, or pneumothorax. Probable area of interstitial coarsening or scarring in the right upper lobe. Stable cardiac silhouette. No acute osseous pathology. A 3 mm rounded radiopaque/metallic density over the T1 similar to prior radiograph. IMPRESSION: No active disease. Electronically Signed   By: Anner Crete M.D.   On: 12/04/2018 04:28    ASSESSMENT AND PLAN:  *Acute left-sided weakness with facial droop Resolved Admitted on our CVA/TIA protocol, aspirin daily, on heparin drip, neurology input appreciated-no intervention recommended, MRI of the brain negative for any acute process/noted multiple old CVAs, telemetry to evaluate for possible arrhythmia given MRI findings, PT/OT/physical therapy to evaluate/treat  *Acute elevated troponins Stable Clinical exam inconsistent with ACS, CEs inconsistent with ACS Suspect secondary to uncontrolled hypertension/accelerated hypertension from demand ischemia Cardiology consulted for expert opinion, follow-up on echocardiogram, aspirin, heparin drip, placed on ACS protocol while in house  *Acute kidney injury with chronic kidney disease stage III No improvement Continue to avoid nephrotoxic agents, strict I&O monitoring, daily weights, BMP in the morning,  consult nephrology for expert opinion, renal ultrasound negative for any acute process  *Chronic orthostatic hypotension Stable Continue Florinef  *Chronic hypertension Stable Continue Norvasc, Lasix  *Acute iatrogenic hyperthyroidism, chronic hypothyroidism Decrease  Synthroid to 100 mcg daily We will need to have TSH rechecked in 4 to 6-week status post discharge for further adjustment of primary care provider  *Acute diarrhea Resolved May be related to iatrogenic hypothyroidism-Synthroid adjusted Check GI panel  *Chronic diabetes mellitus type 2 Stable Continue Glucotrol, carbohydrate consistent diet, sliding scale insulin with Accu-Cheks per routine  Disposition Home in 1 to 2 days barring any complications  All the records are reviewed and case discussed with Care Management/Social Workerr. Management plans discussed with the patient, family and they are in agreement.  CODE STATUS: full  TOTAL TIME TAKING CARE OF THIS PATIENT: 40 minutes.    POSSIBLE D/C IN 1-2 DAYS, DEPENDING ON CLINICAL CONDITION.   Avel Peace Brysan Mcevoy M.D on 12/05/2018   Between 7am to 6pm - Pager - [phone]  After 6pm go to [email or web address] - password [email or web address] [facility]  Office  [phone]  CC: Primary care physician; [name], MD  Note: This dictation was prepared with Dragon dictation along with smaller phrase technology. Any transcriptional errors that result from this process are unintentional.

## 2018-12-05 NOTE — Progress Notes (Signed)
Restart heparin gtt per Patria Mane, NP.  Spoke with Shanon Brow [facility] concerning dosing.

## 2018-12-05 NOTE — Progress Notes (Signed)
[facility]  ROUNDING NOTE   Subjective:   [name] admitted to [facility] on 12/04/2018 for Weakness [R53.1] CVA (cerebral vascular accident) [facility]) [I63.9] Renal insufficiency [N28.9] Elevated troponin [R79.89] AKI (acute kidney injury) ([place]) [N17.9] Cerebrovascular accident (CVA), unspecified mechanism ([place]) [I63.9]  Patient was on hemodialysis 9/20 to  10/18/2018. Followed by [facility]. Catheter now removed. Creatinine and GFR have significantly improved.   Objective:  Vital signs in last 24 hours:  Temp:  [97.4 F (36.3 C)-98.5 F (36.9 C)] 97.4 F (36.3 C) (03/01 0857) Pulse Rate:  [66-80] 66 (03/01 1006) Resp:  [12-22] 18 (03/01 0857) BP: (88-207)/(53-101) 146/73 (03/01 1006) SpO2:  [96 %-100 %] 100 % (03/01 0857) Weight:  [67.2 kg] 67.2 kg (03/01 0407)  Weight change: 1.429 kg Filed Weights   12/04/18 0351 12/05/18 0407  Weight: 65.8 kg 67.2 kg    Intake/Output: I/O last 3 completed shifts: In: 24.6 [I.V.:24.6] Out: 300 [Urine:300]   Intake/Output this shift:  Total I/O In: 240 [P.O.:240] Out: -   Physical Exam: General: NAD,   Head: Normocephalic, atraumatic. Moist oral mucosal membranes  Eyes: Anicteric, PERRL  Neck: Supple, trachea midline  Lungs:  Clear to auscultation  Heart: Regular rate and rhythm  Abdomen:  Soft, nontender,   Extremities: no peripheral edema.  Neurologic: Nonfocal, moving all four extremities  Skin: No lesions  Access: none    Basic Metabolic Panel: Recent Labs  Lab 12/04/18 0435 12/04/18 0840 12/05/18 0858  NA 138  --  136  K 4.8  --  4.7  CL 114*  --  113*  CO2 18*  --  18*  GLUCOSE 121*  --  171*  BUN 47*  --  50*  CREATININE 2.86*  --  2.76*  CALCIUM 8.9  --  8.6*  MG  --  1.4*  --   PHOS  --  4.1  --     Liver Function Tests: Recent Labs  Lab 12/04/18 0435  AST 31  ALT 52*  ALKPHOS 93  BILITOT 0.7  PROT 7.0  ALBUMIN 3.7   No results for input(s): LIPASE, AMYLASE in the  last 168 hours. No results for input(s): AMMONIA in the last 168 hours.  CBC: Recent Labs  Lab 12/04/18 0435 12/05/18 0453  WBC 4.3 3.9*  NEUTROABS 2.6  --   HGB 11.6* 10.5*  HCT 35.4* 32.1*  MCV 75.6* 76.4*  PLT 118* 108*    Cardiac Enzymes: Recent Labs  Lab 12/04/18 0435 12/04/18 0840 12/04/18 1109  TROPONINI 0.51* 0.49* 0.52*    BNP: Invalid input(s): POCBNP  CBG: Recent Labs  Lab 12/04/18 1157  GLUCAP 91    Microbiology: Results for orders placed or performed during the hospital encounter of 11/02/18  C difficile quick scan w PCR reflex     Status: None   Collection Time: 11/02/18 12:20 PM  Result Value Ref Range Status   C Diff antigen NEGATIVE NEGATIVE Final   C Diff toxin NEGATIVE NEGATIVE Final   C Diff interpretation No C. difficile detected.  Final    Comment: Performed at [facility], [address] [place], [place] [address]  Gastrointestinal Panel by PCR , Stool     Status: None   Collection Time: 11/02/18 12:20 PM  Result Value Ref Range Status   Campylobacter species NOT DETECTED NOT DETECTED Final   Plesimonas shigelloides NOT DETECTED NOT DETECTED Final   Salmonella species NOT DETECTED NOT DETECTED Final   Yersinia enterocolitica  NOT DETECTED NOT DETECTED Final   Vibrio species NOT DETECTED NOT DETECTED Final   Vibrio cholerae NOT DETECTED NOT DETECTED Final   Enteroaggregative E coli (EAEC) NOT DETECTED NOT DETECTED Final   Enteropathogenic E coli (EPEC) NOT DETECTED NOT DETECTED Final   Enterotoxigenic E coli (ETEC) NOT DETECTED NOT DETECTED Final   Shiga like toxin producing E coli (STEC) NOT DETECTED NOT DETECTED Final   Shigella/Enteroinvasive E coli (EIEC) NOT DETECTED NOT DETECTED Final   Cryptosporidium NOT DETECTED NOT DETECTED Final   Cyclospora cayetanensis NOT DETECTED NOT DETECTED Final   Entamoeba histolytica NOT DETECTED NOT DETECTED Final   Giardia lamblia NOT DETECTED NOT DETECTED Final   Adenovirus F40/41  NOT DETECTED NOT DETECTED Final   Astrovirus NOT DETECTED NOT DETECTED Final   Norovirus GI/GII NOT DETECTED NOT DETECTED Final   Rotavirus A NOT DETECTED NOT DETECTED Final   Sapovirus (I, II, IV, and V) NOT DETECTED NOT DETECTED Final    Comment: Performed at [facility], [address] [place], [place] [address]    Coagulation Studies: Recent Labs    12/04/18 0435  LABPROT 14.0  INR 1.1    Urinalysis: Recent Labs    12/04/18 0435  COLORURINE YELLOW*  LABSPEC 1.012  PHURINE 5.0  GLUCOSEU NEGATIVE  HGBUR MODERATE*  BILIRUBINUR NEGATIVE  KETONESUR NEGATIVE  PROTEINUR 100*  NITRITE NEGATIVE  LEUKOCYTESUR NEGATIVE      Imaging: Ct Head Wo Contrast  Result Date: 12/04/2018 CLINICAL DATA:  68 year old male with slurred speech. EXAM: CT HEAD WITHOUT CONTRAST TECHNIQUE: Contiguous axial images were obtained from the base of the skull through the vertex without intravenous contrast. COMPARISON:  Head CT dated 07/14/2006 FINDINGS: Brain: The ventricles and sulci are appropriate size for patient's age. Mild periventricular and deep white matter chronic microvascular ischemic changes noted. There is an old area of infarct and encephalomalacia involving the inferior left temporal lobe. No acute intracranial hemorrhage. No mass effect or midline shift. No extra-axial fluid collection. Vascular: No hyperdense vessel or unexpected calcification. Skull: Normal. Negative for fracture or focal lesion. Sinuses/Orbits: No acute finding. Other: Phthisis bulbi of the left globe. The visualized paranasal sinuses and mastoid air cells are clear. IMPRESSION: 1. No acute intracranial hemorrhage. 2. Mild age-related atrophy and chronic microvascular ischemic changes. 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 Electronically Signed   By: Anner Crete M.D.   On: 12/04/2018 05:53   [name] Contrast  Result Date: 12/04/2018 CLINICAL DATA:  Left arm weakness and numbness, facial droop, and slurred  speech. EXAM: MRI HEAD WITHOUT CONTRAST TECHNIQUE: Multiplanar, multiecho pulse sequences of the brain and surrounding structures were obtained without intravenous contrast. COMPARISON:  12/04/2018 head CT FINDINGS: Brain: There is no evidence of acute infarct, mass, midline shift, or extra-axial fluid collection. A chronic microhemorrhage is noted in the right superior frontal gyrus. Encephalomalacia anteriorly in the left temporal lobe may be post ischemic or posttraumatic. Chronic lacunar infarcts are present in the thalami, bilateral cerebral white matter, left lentiform nucleus/external capsule, right pons, and possibly left cerebellum. Patchy T2 hyperintensities in the cerebral white matter bilaterally are nonspecific but compatible with moderate chronic small vessel ischemic disease. There is mild-to-moderate cerebral atrophy. Vascular: Major intracranial vascular flow voids are preserved. Skull and upper cervical spine: Unremarkable bone marrow signal. Sinuses/Orbits: Right cataract extraction. Left phthisis bulbi. Small bilateral mastoid effusions. Clear paranasal sinuses. Other: None. IMPRESSION: 1. No acute intracranial abnormality. 2. Moderate chronic small vessel ischemic disease with numerous chronic lacunar  infarcts as above. 3. 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 Electronically Signed   By: Logan Bores M.D.   On: 12/04/2018 08:41   Mr Cervical Spine Wo Contrast  Result Date: 12/04/2018 CLINICAL DATA:  68 y/o M; recurrent episodes of left arm numbness and weakness. EXAM: MRI CERVICAL SPINE WITHOUT CONTRAST TECHNIQUE: Multiplanar, multisequence MR imaging of the cervical spine was performed. No intravenous contrast was administered. COMPARISON:  07/15/2006 neck radiographs. FINDINGS: Alignment: Physiologic. Vertebrae: Mild edema within the odontoid process and small soft tissue pannus posterior to the odontoid process. Cord: Normal signal and  morphology. Posterior Fossa, vertebral arteries, paraspinal tissues: Negative. Disc levels: C2-3: Small right foraminal disc protrusion with mild right foraminal stenosis. No spinal canal stenosis. C3-4: Disc osteophyte complex with bilateral uncovertebral and facet hypertrophy resulting in moderate right and mild left foraminal stenosis. No significant spinal canal stenosis. C4-5: Disc osteophyte complex with left-greater-than-right uncovertebral and facet hypertrophy. Moderate right and moderate to severe left foraminal stenosis. There is a small central protrusion contacts the anterior cord and results in mild spinal canal stenosis. C5-6: Disc osteophyte complex with central disc protrusion as well as bilateral uncovertebral and facet hypertrophy. Moderate bilateral foraminal stenosis. The disc protrusion contacts the anterior cord and results in mild spinal canal stenosis. C6-7: Disc osteophyte complex with left-greater-than-right uncovertebral and facet hypertrophy. Mild right and moderate left neural foraminal stenosis. No significant spinal canal stenosis. C7-T1: No significant disc displacement, foraminal stenosis, or canal stenosis. IMPRESSION: 1. Mild edema within the odontoid process and small soft tissue pannus posterior to the odontoid process. Findings may reflect rheumatoid or crystal deposition disease. 2. No additional osseous or cord signal abnormality. 3. Cervical spondylosis with predominant discogenic degenerative changes greatest at the C4-5 and C5-6 levels. 4. Moderate to severe left C4-5 foraminal stenosis. Multilevel mild and moderate foraminal stenosis. 5. Mild C4-5 and C5-6 spinal canal stenosis. No high-grade spinal canal stenosis. Electronically Signed   By: Kristine Garbe M.D.   On: 12/04/2018 17:14   US Renal  Result Date: 12/04/2018 CLINICAL DATA:  Acute renal injury EXAM: RENAL / URINARY TRACT ULTRASOUND COMPLETE COMPARISON:  06/29/2017 FINDINGS: Right Kidney: Renal  measurements: 9.6 x 5.8 x 5.0 cm = volume: 147 mL . Echogenicity within normal limits. No mass or hydronephrosis visualized. Left Kidney: Renal measurements: 9.4 x 5.4 x 5.4 cm. = volume: 145 mL. No hydronephrosis is noted. There is a 1.6 cm cyst in the lower pole of the left kidney. This is stable from the prior exam. Bladder: Mild thickening of the bladder wall is noted although this is felt to be related to incomplete distension. IMPRESSION: Stable left renal cyst.  No acute abnormality noted. Electronically Signed   By: Inez Catalina M.D.   On: 12/04/2018 07:44   US Carotid Bilateral (at [facility] Only)  Result Date: 12/04/2018 CLINICAL DATA:  CVA EXAM: BILATERAL CAROTID DUPLEX ULTRASOUND TECHNIQUE: Pearline Cables scale imaging, color Doppler and duplex ultrasound were performed of bilateral carotid and vertebral arteries in the neck. COMPARISON:  09/14/2016 FINDINGS: Criteria: Quantification of carotid stenosis is based on velocity parameters that correlate the residual internal carotid diameter with NASCET-based stenosis levels, using the diameter of the distal internal carotid lumen as the denominator for stenosis measurement. The following velocity measurements were obtained: RIGHT ICA: 84 cm/sec CCA: 65 cm/sec SYSTOLIC ICA/CCA RATIO:  1.3 ECA: 73 cm/sec LEFT ICA: 56 cm/sec CCA: 66 cm/sec SYSTOLIC ICA/CCA RATIO:  0.9 ECA: 53 cm/sec RIGHT  CAROTID ARTERY: Little if any plaque in the bulb. Low resistance internal carotid Doppler pattern is preserved. RIGHT VERTEBRAL ARTERY:  Antegrade. LEFT CAROTID ARTERY: Little if any plaque in the bulb. Low resistance internal carotid Doppler pattern is preserved. LEFT VERTEBRAL ARTERY:  Antegrade. IMPRESSION: Less than 50% stenosis in the right and left internal carotid arteries. Electronically Signed   By: Marybelle Killings M.D.   On: 12/04/2018 14:52   Dg Chest Port 1 View  Result Date: 12/04/2018 CLINICAL DATA:  68 year old male with weakness. EXAM: PORTABLE CHEST 1 VIEW  COMPARISON:  Chest radiograph dated 05/19/2017 FINDINGS: Slight eventration of the right hemidiaphragm. No focal consolidation, pleural effusion, or pneumothorax. Probable area of interstitial coarsening or scarring in the right upper lobe. Stable cardiac silhouette. No acute osseous pathology. A 3 mm rounded radiopaque/metallic density over the T1 similar to prior radiograph. IMPRESSION: No active disease. Electronically Signed   By: Anner Crete M.D.   On: 12/04/2018 04:28     Medications:   . sodium chloride    . heparin 750 Units/hr (12/04/18 0916)   .  stroke: mapping our early stages of recovery book   Does not apply Once  . amLODipine  5 mg Oral Daily  . aspirin  81 mg Oral Daily  . atorvastatin  40 mg Oral QHS  . cholecalciferol  1,000 Units Oral Daily  . citalopram  20 mg Oral Daily  . furosemide  80 mg Oral Daily  . glipiZIDE  2.5 mg Oral QAC breakfast  . lactobacillus  1 g Oral TID WC  . [START ON 12/06/2018] levothyroxine  100 mcg Oral QAC breakfast  . lipase/protease/amylase  72,000 Units Oral TID WC  . sodium bicarbonate  650 mg Oral QID  . sodium chloride flush  3 mL Intravenous Q12H   sodium chloride, acetaminophen, ALPRAZolam, hydrOXYzine, loperamide, nitroGLYCERIN, ondansetron (ZOFRAN) IV, polyvinyl alcohol, sodium chloride flush  Assessment/ Plan:  Mr. Cameron NESHEIWAT is a 68 y.o. black male with Left eye blindness, diabetes mellitus type 2, hypertension, syncope, and chronic kidney disease, who was admitted to [facility] on 12/04/2018 for Weakness [R53.1] CVA (cerebral vascular accident) ([place]) [I63.9] Renal insufficiency [N28.9] Elevated troponin [R79.89] AKI (acute kidney injury) ([place]) [N17.9] Cerebrovascular accident (CVA), unspecified mechanism ([place]) [I63.9]  1. Chronic kidney disease stage IV. Follows with [facility].  History of acute renal failure requiring hemodialylsis for several months. Has been off dialysis since 10/18/18. CVC has been removed.   Chronic kidney disease secondary to diabetic nephropathy and hypertension  2. Metabolic acidosis - PO bicarb  3. Hypertension: allow to ride high due to presumed ischemic event.  - home regimen of fursoemide, amlodipine  4. Diabetes mellitus type II with chronic kidney disease: insulin dependent. History of poor control.     LOS: 1 Myson Levi 3/1/202011:03 AM

## 2018-12-05 NOTE — Progress Notes (Signed)
2040 To MRI with Britton-Lee, RN and transport member

## 2018-12-05 NOTE — Consult Note (Signed)
Name: Cameron Adams MRN: [id number] DOB: 10-Jan-1951    ADMISSION DATE:  12/04/2018 CONSULTATION DATE: 12/05/2018  REFERRING MD : Dr. Leslye Peer   CHIEF COMPLAINT: Altered Mental Status   BRIEF PATIENT DESCRIPTION: 68 y/o AAM admitted with Acute left-sided weakness with facial droop, ruled out for acute CVA and was admitted to the floor and started on heaprin for chronically elevated troponin and acute on chronic renal failure. 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 Moderate chronic small vessel ischemic disease with numerous chronic lacunar infarcts as above. Chronic  infarct or posttraumatic encephalomalacia in the anterior left temporal lobe. 02/29-MR Cervical Spine showed mild edema within the odontoid process and small soft tissue pannus posterior to the odontoid process. Findings may reflect rheumatoid or crystal deposition disease. No additional osseous or cord signal abnormality. Cervical spondylosis with predominant discogenic degenerative changes greatest at the C4-5 and C5-6 levels. Moderate to severe left C4-5 foraminal stenosis. Multilevel mild and moderate foraminal stenosis. Mild C4-5 and C5-6 spinal canal stenosis. No high-grade spinal canal stenosis. 02/29-US Carotid Bilateral revealed less than 50% stenosis in the right and left internal carotid arteries. 02/29-Renal US revealed stable left renal cyst. No acute abnormality noted. 03/1-Echo revealed left ventricle has moderate-severely reduced systolic function and left ventricular diastolic parameters consistent with impaired relaxation left ventricular diffuse hypokinesis, with an EF 30-35% 03/1-Pt showing stroke-like  symptoms Code Stroke initiated and pt transferred to ICU. MRI negative, heparin resumed    HISTORY OF PRESENT ILLNESS: This is a 68 year old African-American male admitted on 12/04/2018 with complaints of left-sided weakness and facial droop.  He was found to have a creatinine that is mildly elevated from his baseline, elevated troponins and he was ruled in for a TIA and admitted for further work-up and observation.  Today a rapid response was called because patient became acutely altered with great suspicion for CVA.  His initial CT head was negative.  He was not a candidate for TPA because he was already on a heparin infusion for chronically elevated troponins.  His stat MRI of the brain do not show any acute infarct but showed previous old infarcts.  His mentation has improved.  Patient is awake and asking for food following return from MRI.  He denies chest pain palpitations dizziness and headache. Was seen and evaluated by tele-neurology.  They felt that his encephalopathy is likely due to metabolic derangements rather than acute CVA.  We also recommended that if his MRI was negative, his heparin infusion should be restarted.  PAST MEDICAL HISTORY :   has a past medical history of Anemia of chronic disease, Blindness of left eye, Chronic kidney disease, stage III (moderate) ([place]), Diabetes ([place]), Diarrhea, Diastolic dysfunction, Hypertension, Hypothyroidism, Monoclonal gammopathy of unknown significance ([facility]), Orthostatic hypotension, and Syncope and collapse.  has a past surgical history that includes Thyroid surgery; Cardiac catheterization (N/A, 09/15/2016); and Amputation toe (Right, 09/19/2016). Prior to Admission medications   Medication Sig Start Date End Date Taking? Authorizing Provider  amLODipine (NORVASC) 5 MG tablet Take 5 mg by mouth daily.  09/15/18  Yes [provider]  aspirin EC 81 MG tablet Take 81 mg by mouth daily.   Yes [provider]  atorvastatin (LIPITOR)  40 MG tablet Take 40 mg by  mouth at bedtime.   Yes [provider]  Cholecalciferol (VITAMIN D-1000 MAX ST) 25 MCG (1000 UT) tablet Take 2,000 Units by mouth daily.    Yes [provider]  citalopram (CELEXA) 20 MG tablet Take 20 mg by mouth daily.   Yes [provider]  furosemide (LASIX) 80 MG tablet Take 80 mg by mouth.  09/22/18  Yes [provider]  glipiZIDE (GLUCOTROL) 5 MG tablet Take 2.5 mg by mouth daily before breakfast.  11/26/18  Yes [provider]  hydroxypropyl methylcellulose / hypromellose (ISOPTO TEARS / GONIOVISC) 2.5 % ophthalmic solution Place 1 drop into both eyes 3 (three) times daily as needed for dry eyes.   Yes [provider]  levothyroxine (SYNTHROID, LEVOTHROID) 200 MCG tablet Take 1 tablet (200 mcg total) by mouth daily before breakfast. 05/21/17  Yes Vaughan Basta, MD  lipase/protease/amylase (CREON) 36000 UNITS CPEP capsule Take 2 capsules (72,000 Units total) by mouth 3 (three) times daily with meals. 06/29/17  Yes Vaughan Basta, MD  loperamide (IMODIUM) 2 MG capsule Take 1 capsule (2 mg total) by mouth every 6 (six) hours as needed for diarrhea or loose stools. 06/29/17  Yes Vaughan Basta, MD   Allergies  Allergen Reactions  . Gabapentin     Other reaction(s): Other (See Comments) Trouble sleeping     FAMILY HISTORY:  family history includes Heart disease in his brother and father; Kidney failure in his father and mother. SOCIAL HISTORY:  reports that he quit smoking about 40 years ago. His smoking use included cigarettes. He has a 2.50 pack-year smoking history. He has never used smokeless tobacco. He reports that he does not drink alcohol or use drugs.  REVIEW OF SYSTEMS: Constitutional: Negative for fever and chills.  HENT: Negative for congestion and rhinorrhea.  Eyes: Negative for redness and visual disturbance.  Respiratory: Negative for shortness of breath and wheezing.    Cardiovascular: Negative for chest pain and palpitations.  Gastrointestinal: Negative  for nausea , vomiting and abdominal pain but reports diarrhea overnight Genitourinary: Negative for dysuria and urgency.  Endocrine: Denies polyuria, polyphagia and heat intolerance Musculoskeletal: Negative for myalgias and arthralgias.  Skin: Negative for pallor and wound.  Neurological: Negative for dizziness and headaches      VITAL SIGNS: Temp:  [97.4 F (36.3 C)-98.5 F (36.9 C)] 98.2 F (36.8 C) (03/01 1730) Pulse Rate:  [66-80] 78 (03/01 1730) Resp:  [17-18] 18 (03/01 1730) BP: (88-171)/(53-82) 132/79 (03/01 1730) SpO2:  [100 %] 100 % (03/01 1730) Weight:  [67.2 kg] 67.2 kg (03/01 0407)  PHYSICAL EXAMINATION: Genral: Well-nourished, well-developed, chronically ill looking HEENT: PERRLA, trachea midline, no JVD, no gaze deviation Cardiovascular: Apical pulse regular, S1-S2, no murmur regurg or gallop, +2 pulses bilateral Pulmonary/Chest: Effort normal.  Bilateral breath sounds, no wheezes or rhonchi. Abdominal: Distended, normal bowel sounds in all 4 quadrants, palpation reveals no organomegaly Musculoskeletal: Positive range of motion, no joint deformity Neurological: Awake, follows basic commands, speech is NORMAL Skin: Warm and dry Psychiatric: Cooperative   Recent Labs  Lab 12/04/18 0435 12/05/18 0858  NA 138 136  K 4.8 4.7  CL 114* 113*  CO2 18* 18*  BUN 47* 50*  CREATININE 2.86* 2.76*  GLUCOSE 121* 171*   Recent Labs  Lab 12/04/18 0435 12/05/18 0453  HGB 11.6* 10.5*  HCT 35.4* 32.1*  WBC 4.3 3.9*  PLT 118* 108*   Ct Head Wo Contrast  Result Date: 12/04/2018 CLINICAL DATA:  68 year old male with slurred speech. EXAM:  CT HEAD WITHOUT CONTRAST TECHNIQUE: Contiguous axial images were obtained from the base of the skull through the vertex without intravenous contrast. COMPARISON:  Head CT dated 07/14/2006 FINDINGS: Brain: The ventricles and sulci are appropriate size  for patient's age. Mild periventricular and deep white matter chronic microvascular ischemic changes noted. There is an old area of infarct and encephalomalacia involving the inferior left temporal lobe. No acute intracranial hemorrhage. No mass effect or midline shift. No extra-axial fluid collection. Vascular: No hyperdense vessel or unexpected calcification. Skull: Normal. Negative for fracture or focal lesion. Sinuses/Orbits: No acute finding. Other: Phthisis bulbi of the left globe. The visualized paranasal sinuses and mastoid air cells are clear. IMPRESSION: 1. No acute intracranial hemorrhage. 2. Mild age-related atrophy and chronic microvascular ischemic changes. 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 Electronically Signed   By: Anner Crete M.D.   On: 12/04/2018 05:53   [name] Contrast  Result Date: 12/04/2018 CLINICAL DATA:  Left arm weakness and numbness, facial droop, and slurred speech. EXAM: MRI HEAD WITHOUT CONTRAST TECHNIQUE: Multiplanar, multiecho pulse sequences of the brain and surrounding structures were obtained without intravenous contrast. COMPARISON:  12/04/2018 head CT FINDINGS: Brain: There is no evidence of acute infarct, mass, midline shift, or extra-axial fluid collection. A chronic microhemorrhage is noted in the right superior frontal gyrus. Encephalomalacia anteriorly in the left temporal lobe may be post ischemic or posttraumatic. Chronic lacunar infarcts are present in the thalami, bilateral cerebral white matter, left lentiform nucleus/external capsule, right pons, and possibly left cerebellum. Patchy T2 hyperintensities in the cerebral white matter bilaterally are nonspecific but compatible with moderate chronic small vessel ischemic disease. There is mild-to-moderate cerebral atrophy. Vascular: Major intracranial vascular flow voids are preserved. Skull and upper cervical spine: Unremarkable bone marrow signal. Sinuses/Orbits: Right cataract extraction. Left phthisis bulbi.  Small bilateral mastoid effusions. Clear paranasal sinuses. Other: None. IMPRESSION: 1. No acute intracranial abnormality. 2. Moderate chronic small vessel ischemic disease with numerous chronic lacunar infarcts as above. 3. 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 Electronically Signed   By: Logan Bores M.D.   On: 12/04/2018 08:41   Mr Cervical Spine Wo Contrast  Result Date: 12/04/2018 CLINICAL DATA:  68 y/o M; recurrent episodes of left arm numbness and weakness. EXAM: MRI CERVICAL SPINE WITHOUT CONTRAST TECHNIQUE: Multiplanar, multisequence MR imaging of the cervical spine was performed. No intravenous contrast was administered. COMPARISON:  07/15/2006 neck radiographs. FINDINGS: Alignment: Physiologic. Vertebrae: Mild edema within the odontoid process and small soft tissue pannus posterior to the odontoid process. Cord: Normal signal and morphology. Posterior Fossa, vertebral arteries, paraspinal tissues: Negative. Disc levels: C2-3: Small right foraminal disc protrusion with mild right foraminal stenosis. No spinal canal stenosis. C3-4: Disc osteophyte complex with bilateral uncovertebral and facet hypertrophy resulting in moderate right and mild left foraminal stenosis. No significant spinal canal stenosis. C4-5: Disc osteophyte complex with left-greater-than-right uncovertebral and facet hypertrophy. Moderate right and moderate to severe left foraminal stenosis. There is a small central protrusion contacts the anterior cord and results in mild spinal canal stenosis. C5-6: Disc osteophyte complex with central disc protrusion as well as bilateral uncovertebral and facet hypertrophy. Moderate bilateral foraminal stenosis. The disc protrusion contacts the anterior cord and results in mild spinal canal stenosis. C6-7: Disc osteophyte complex with left-greater-than-right uncovertebral and facet hypertrophy. Mild right and moderate left neural foraminal stenosis. No  significant spinal canal stenosis. C7-T1: No significant disc displacement, foraminal stenosis, or canal stenosis. IMPRESSION: 1. Mild edema  within the odontoid process and small soft tissue pannus posterior to the odontoid process. Findings may reflect rheumatoid or crystal deposition disease. 2. No additional osseous or cord signal abnormality. 3. Cervical spondylosis with predominant discogenic degenerative changes greatest at the C4-5 and C5-6 levels. 4. Moderate to severe left C4-5 foraminal stenosis. Multilevel mild and moderate foraminal stenosis. 5. Mild C4-5 and C5-6 spinal canal stenosis. No high-grade spinal canal stenosis. Electronically Signed   By: Kristine Garbe M.D.   On: 12/04/2018 17:14   US Renal  Result Date: 12/04/2018 CLINICAL DATA:  Acute renal injury EXAM: RENAL / URINARY TRACT ULTRASOUND COMPLETE COMPARISON:  06/29/2017 FINDINGS: Right Kidney: Renal measurements: 9.6 x 5.8 x 5.0 cm = volume: 147 mL . Echogenicity within normal limits. No mass or hydronephrosis visualized. Left Kidney: Renal measurements: 9.4 x 5.4 x 5.4 cm. = volume: 145 mL. No hydronephrosis is noted. There is a 1.6 cm cyst in the lower pole of the left kidney. This is stable from the prior exam. Bladder: Mild thickening of the bladder wall is noted although this is felt to be related to incomplete distension. IMPRESSION: Stable left renal cyst.  No acute abnormality noted. Electronically Signed   By: Inez Catalina M.D.   On: 12/04/2018 07:44   US Carotid Bilateral (at [facility] And Ap Only)  Result Date: 12/04/2018 CLINICAL DATA:  CVA EXAM: BILATERAL CAROTID DUPLEX ULTRASOUND TECHNIQUE: Pearline Cables scale imaging, color Doppler and duplex ultrasound were performed of bilateral carotid and vertebral arteries in the neck. COMPARISON:  09/14/2016 FINDINGS: Criteria: Quantification of carotid stenosis is based on velocity parameters that correlate the residual internal carotid diameter with NASCET-based stenosis levels,  using the diameter of the distal internal carotid lumen as the denominator for stenosis measurement. The following velocity measurements were obtained: RIGHT ICA: 84 cm/sec CCA: 65 cm/sec SYSTOLIC ICA/CCA RATIO:  1.3 ECA: 73 cm/sec LEFT ICA: 56 cm/sec CCA: 66 cm/sec SYSTOLIC ICA/CCA RATIO:  0.9 ECA: 53 cm/sec RIGHT CAROTID ARTERY: Little if any plaque in the bulb. Low resistance internal carotid Doppler pattern is preserved. RIGHT VERTEBRAL ARTERY:  Antegrade. LEFT CAROTID ARTERY: Little if any plaque in the bulb. Low resistance internal carotid Doppler pattern is preserved. LEFT VERTEBRAL ARTERY:  Antegrade. IMPRESSION: Less than 50% stenosis in the right and left internal carotid arteries. Electronically Signed   By: Marybelle Killings M.D.   On: 12/04/2018 14:52   Dg Chest Port 1 View  Result Date: 12/04/2018 CLINICAL DATA:  68 year old male with weakness. EXAM: PORTABLE CHEST 1 VIEW COMPARISON:  Chest radiograph dated 05/19/2017 FINDINGS: Slight eventration of the right hemidiaphragm. No focal consolidation, pleural effusion, or pneumothorax. Probable area of interstitial coarsening or scarring in the right upper lobe. Stable cardiac silhouette. No acute osseous pathology. A 3 mm rounded radiopaque/metallic density over the T1 similar to prior radiograph. IMPRESSION: No active disease. Electronically Signed   By: Anner Crete M.D.   On: 12/04/2018 04:28    ASSESSMENT  Rule out acute CVA and/or TIA Acute on chronic renal failure Elevated troponin Chronic orthostatic hypotension Hypomagnesemia History of hypertension, hypothyroidism and type 2 diabetes   PLAN Overall his CVA/TIA work-up is negative.  Symptoms most likely due to metabolic derangements. Resume heparin infusion per neurology recommendations. Neurochecks per ICU protocol Troponin trending down.  Continue to cycle Rest of the treatment plan unchanged  Shaneice Barsanti S. [facility] ANP-BC Pulmonary and Critical Care Medicine [facility] Pager [phone] or [phone]  NB: This document was prepared using Set designer  software and may include unintentional dictation errors.  12/05/2018, 6:31 PM

## 2018-12-05 NOTE — Progress Notes (Signed)
Pt returned from MRI.  Pt had an episode of orthostatic hypotension.   Cameron Mane, NP notified by Domingo Pulse RN

## 2018-12-05 NOTE — Progress Notes (Signed)

## 2018-12-05 NOTE — Progress Notes (Signed)
Lab at bedside for 2045 draw.

## 2018-12-05 NOTE — Progress Notes (Signed)
Ch responded to code STROKE. Received 3 separate pg for this pt. Ch was able to immediately comfort the daughter outside of the room. Daughter had just arrived in the room and the pt was trying to ambulate to the BR; tried to pull string but could not. Pt became weak and unresponsive. Daughter of pt was able to get help in time. Daughter was still shaken by the incident but felt better when she was told he was responding before being taken to get a CT scan. No further needs at this time.       12/05/18 2100  Clinical Encounter Type  Visited With Patient and family together;Health care provider  Visit Type Psychological support;Spiritual support;Social support;Code  Referral From Physician  Consult/Referral To Chaplain  Spiritual Encounters  Spiritual Needs Emotional;Grief support  Stress Factors  Patient Stress Factors Loss of control;Major life changes  Family Stress Factors Major life changes

## 2018-12-06 ENCOUNTER — Inpatient Hospital Stay: Payer: Medicare Other

## 2018-12-06 DIAGNOSIS — R531 Weakness: Secondary | ICD-10-CM

## 2018-12-06 DIAGNOSIS — I248 Other forms of acute ischemic heart disease: Secondary | ICD-10-CM

## 2018-12-06 LAB — CBC
HCT: 30.2 % — ABNORMAL LOW (ref 39.0–52.0)
Hemoglobin: 10.2 g/dL — ABNORMAL LOW (ref 13.0–17.0)
MCH: 25.1 pg — ABNORMAL LOW (ref 26.0–34.0)
MCHC: 33.8 g/dL (ref 30.0–36.0)
MCV: 74.4 fL — ABNORMAL LOW (ref 80.0–100.0)
PLATELETS: 103 10*3/uL — AB (ref 150–400)
RBC: 4.06 MIL/uL — ABNORMAL LOW (ref 4.22–5.81)
RDW: 17.1 % — ABNORMAL HIGH (ref 11.5–15.5)
WBC: 5.2 10*3/uL (ref 4.0–10.5)
nRBC: 0 % (ref 0.0–0.2)

## 2018-12-06 LAB — BASIC METABOLIC PANEL
Anion gap: 7 (ref 5–15)
BUN: 50 mg/dL — ABNORMAL HIGH (ref 8–23)
CO2: 21 mmol/L — ABNORMAL LOW (ref 22–32)
Calcium: 8.4 mg/dL — ABNORMAL LOW (ref 8.9–10.3)
Chloride: 114 mmol/L — ABNORMAL HIGH (ref 98–111)
Creatinine, Ser: 2.58 mg/dL — ABNORMAL HIGH (ref 0.61–1.24)
GFR calc Af Amer: 28 mL/min — ABNORMAL LOW (ref >60)
GFR, EST NON AFRICAN AMERICAN: 24 mL/min — AB (ref >60)
Glucose, Bld: 119 mg/dL — ABNORMAL HIGH (ref 70–99)
Potassium: 5 mmol/L (ref 3.5–5.1)
Sodium: 142 mmol/L (ref 135–145)

## 2018-12-06 LAB — GLUCOSE, CAPILLARY
GLUCOSE-CAPILLARY: 128 mg/dL — AB (ref 70–99)
Glucose-Capillary: 95 mg/dL (ref 70–99)
Glucose-Capillary: 109 mg/dL — ABNORMAL HIGH (ref 70–99)
Glucose-Capillary: 104 mg/dL — ABNORMAL HIGH (ref 70–99)
Glucose-Capillary: 162 mg/dL — ABNORMAL HIGH (ref 70–99)

## 2018-12-06 LAB — HEPARIN LEVEL (UNFRACTIONATED)

## 2018-12-06 LAB — MAGNESIUM: Magnesium: 1.4 mg/dL — ABNORMAL LOW (ref 1.7–2.4)

## 2018-12-06 LAB — PHOSPHORUS: Phosphorus: 3.1 mg/dL (ref 2.5–4.6)

## 2018-12-06 MED ORDER — MAGNESIUM SULFATE 4 GM/100ML IV SOLN
4.0000 g | Freq: Once | INTRAVENOUS | Status: DC
  Filled 2018-12-06: qty 100

## 2018-12-06 MED ORDER — INSULIN ASPART 100 UNIT/ML ~~LOC~~ SOLN: 0.0000 [IU] | Freq: Every day | SUBCUTANEOUS | Status: DC

## 2018-12-06 MED ORDER — HEPARIN SODIUM (PORCINE) 5000 UNIT/ML IJ SOLN
5000.0000 [IU] | Freq: Three times a day (TID) | INTRAMUSCULAR | Status: DC
  Administered 2018-12-06 – 2018-12-08 (×6): 5000 [IU] via SUBCUTANEOUS
  Filled 2018-12-06 (×6): qty 1

## 2018-12-06 MED ORDER — MAGNESIUM SULFATE 4 GM/100ML IV SOLN
4.0000 g | Freq: Once | INTRAVENOUS | Status: AC
  Administered 2018-12-06: 4 g via INTRAVENOUS
  Filled 2018-12-06: qty 100

## 2018-12-06 MED ORDER — INSULIN ASPART 100 UNIT/ML ~~LOC~~ SOLN
0.0000 [IU] | Freq: Three times a day (TID) | SUBCUTANEOUS | Status: DC
  Administered 2018-12-06 – 2018-12-07 (×2): 2 [IU] via SUBCUTANEOUS
  Administered 2018-12-07: 3 [IU] via SUBCUTANEOUS
  Administered 2018-12-08: 18:00:00 8 [IU] via SUBCUTANEOUS
  Administered 2018-12-08: 2 [IU] via SUBCUTANEOUS
  Filled 2018-12-06 (×5): qty 1

## 2018-12-06 NOTE — Progress Notes (Signed)
OT Cancellation Note  Patient Details Name: Cameron Adams MRN: [id number] DOB: 1951-06-21   Cancelled Treatment:    Reason Eval/Treat Not Completed: Other (comment) Pt s/p recent transfer to CCU. Will require new OT order to initiate care as per guidelines after transfer to higher level of care/change in medical status.   Shara Blazing, M.S., OTR/L Ascom: [phone] 12/06/18, 8:03 AM

## 2018-12-06 NOTE — Progress Notes (Signed)
Progress Note  Patient Name: Cameron Adams Date of Encounter: 12/06/2018  Primary Cardiologist: Fletcher Anon  Subjective   The patient was admitted with left-sided weakness and suspected stroke with negative brain imaging.  He was found to have mildly elevated troponin and was seen by Dr. Caryl Comes for consultation.  His troponin elevation was flat and the patient had no symptoms suggestive of acute coronary syndrome.  Thus, no further cardiac work-up was recommended.  He did have an echocardiogram done which showed an EF of 30 to 35%.  His ejection fraction was normal in the past.  The patient had an episode of unresponsiveness last night and was thus transferred to the ICU.  Repeat imaging was still nonrevealing.  He seems to be alert this morning.  He denies chest pain or shortness of breath.  Inpatient Medications    Scheduled Meds: . amLODipine  5 mg Oral Daily  . aspirin  81 mg Oral Daily  . atorvastatin  40 mg Oral QHS  . cholecalciferol  1,000 Units Oral Daily  . citalopram  20 mg Oral Daily  . furosemide  80 mg Oral Daily  . glipiZIDE  2.5 mg Oral QAC breakfast  . lactobacillus  1 g Oral TID WC  . levothyroxine  100 mcg Oral QAC breakfast  . lipase/protease/amylase  72,000 Units Oral TID WC  . sodium bicarbonate  650 mg Oral QID  . sodium chloride flush  3 mL Intravenous Q12H   Continuous Infusions: . sodium chloride    . heparin 750 Units/hr (12/06/18 0600)  . magnesium sulfate 1 - 4 g bolus IVPB Stopped (12/06/18 0810)   PRN Meds: sodium chloride, acetaminophen, ALPRAZolam, hydrALAZINE, hydrOXYzine, loperamide, nitroGLYCERIN, ondansetron (ZOFRAN) IV, polyvinyl alcohol, sodium chloride flush   Vital Signs    Vitals:   12/06/18 0500 12/06/18 0600 12/06/18 0700 12/06/18 0748  BP: (!) 171/74 (!) 152/71 136/66   Pulse: 73 72 66 70  Resp: (!) 33 13 15 (!) 9  Temp:    97.9 F (36.6 C)  TempSrc:    Oral  SpO2: 97% 100% 98% 97%  Weight:      Height:         Intake/Output Summary (Last 24 hours) at 12/06/2018 0848 Last data filed at 12/06/2018 0600 Gross per 24 hour  Intake 467.38 ml  Output -  Net 467.38 ml   Last 3 Weights 12/05/2018 12/04/2018 06/22/2017  Weight (lbs) 148 lb 2.4 oz 145 lb 170 lb  Weight (kg) 67.2 kg 65.772 kg 77.111 kg      Telemetry    Normal sinus rhythm.  No significant arrhythmia noted- Personally Reviewed  ECG     - Personally Reviewed  Physical Exam   GEN: No acute distress.   Neck: No JVD Cardiac: RRR, no murmurs, rubs, or gallops.  Respiratory: Clear to auscultation bilaterally. GI: Soft, nontender, non-distended  MS: No edema; No deformity. Neuro:  Nonfocal  Psych: Normal affect   Labs    Chemistry Recent Labs  Lab 12/04/18 0435 12/05/18 0858 12/05/18 2226 12/06/18 0429  NA 138 136 141 142  K 4.8 4.7 4.9 5.0  CL 114* 113* 111 114*  CO2 18* 18* 21* 21*  GLUCOSE 121* 171* 90 119*  BUN 47* 50* 50* 50*  CREATININE 2.86* 2.76* 2.62* 2.58*  CALCIUM 8.9 8.6* 8.5* 8.4*  PROT 7.0  --  6.7  --   ALBUMIN 3.7  --  3.5  --   AST 31  --  31  --  ALT 52*  --  47*  --   ALKPHOS 93  --  91  --   BILITOT 0.7  --  0.6  --   GFRNONAA 22* 23* 24* 24*  GFRAA 25* 26* 28* 28*  ANIONGAP 6 5 9 7      Hematology Recent Labs  Lab 12/05/18 0453 12/05/18 2226 12/06/18 0429  WBC 3.9* 4.5 5.2  RBC 4.20* 4.37 4.06*  HGB 10.5* 10.8* 10.2*  HCT 32.1* 32.3* 30.2*  MCV 76.4* 73.9* 74.4*  MCH 25.0* 24.7* 25.1*  MCHC 32.7 33.4 33.8  RDW 17.2* 17.2* 17.1*  PLT 108* 108* 103*    Cardiac Enzymes Recent Labs  Lab 12/04/18 0435 12/04/18 0840 12/04/18 1109 12/05/18 2226  TROPONINI 0.51* 0.49* 0.52* 0.40*   No results for input(s): TROPIPOC in the last 168 hours.   BNPNo results for input(s): BNP, PROBNP in the last 168 hours.   DDimer No results for input(s): DDIMER in the last 168 hours.   Radiology    [name] Contrast  Result Date: 12/05/2018 CLINICAL DATA:  Initial evaluation for acute  stroke, right-sided weakness with slurred speech. EXAM: MRI HEAD WITHOUT CONTRAST TECHNIQUE: Multiplanar, multiecho pulse sequences of the brain and surrounding structures were obtained without intravenous contrast. COMPARISON:  Prior CT from earlier the same day as well as previous MRI from 12/04/2018. FINDINGS: Brain: Generalized age-related cerebral atrophy with moderate chronic small vessel ischemic disease, stable. Multiple scatter remote lacunar infarcts seen involving the bilateral thalami and right pons. Encephalomalacia with gliosis at the anterior left temporal pole likely related to remote traumatic injury. No abnormal foci of restricted diffusion to suggest acute or subacute ischemia. Faint diffusion abnormality seen at the level of the right internal capsule on axial DWI sequence image 30 felt to be related to T2 shine through. Gray-white matter differentiation maintained. No encephalomalacia to suggest chronic cortical infarction. No acute intracranial hemorrhage. Single subcentimeter focus susceptibility fact within the high right frontal lobe compatible with a small chronic microhemorrhage, of doubtful significance in isolation. No mass lesion, midline shift or mass effect. No hydrocephalus. No extra-axial fluid collection. Pituitary gland suprasellar region normal. Midline structures intact and normal. Vascular: Major intracranial vascular flow voids are maintained. Skull and upper cervical spine: Prominent degenerative changes noted about the C1-2 articulation. Craniocervical junction otherwise unremarkable. Bone marrow signal intensity within normal limits. No scalp soft tissue abnormality. Sinuses/Orbits: Chronic changes at the left globe noted. Patient status post ocular lens replacement on the right. Paranasal sinuses are clear. Trace bilateral mastoid effusions, of doubtful significance. Inner ear structures grossly normal. Other: None. IMPRESSION: 1. No acute intracranial abnormality. 2.  Generalized age-related cerebral atrophy with moderate chronic small vessel ischemic disease with multiple small remote lacunar infarcts involving the bilateral thalami and pons. 3. Encephalomalacia at the anterior left temporal pole, which could reflect sequelae of chronic infarct or posttraumatic injury. Electronically Signed   By: Jeannine Boga M.D.   On: 12/05/2018 21:46   Mr Cervical Spine Wo Contrast  Result Date: 12/04/2018 CLINICAL DATA:  68 y/o M; recurrent episodes of left arm numbness and weakness. EXAM: MRI CERVICAL SPINE WITHOUT CONTRAST TECHNIQUE: Multiplanar, multisequence MR imaging of the cervical spine was performed. No intravenous contrast was administered. COMPARISON:  07/15/2006 neck radiographs. FINDINGS: Alignment: Physiologic. Vertebrae: Mild edema within the odontoid process and small soft tissue pannus posterior to the odontoid process. Cord: Normal signal and morphology. Posterior Fossa, vertebral arteries, paraspinal tissues: Negative. Disc levels: C2-3: Small right foraminal disc  protrusion with mild right foraminal stenosis. No spinal canal stenosis. C3-4: Disc osteophyte complex with bilateral uncovertebral and facet hypertrophy resulting in moderate right and mild left foraminal stenosis. No significant spinal canal stenosis. C4-5: Disc osteophyte complex with left-greater-than-right uncovertebral and facet hypertrophy. Moderate right and moderate to severe left foraminal stenosis. There is a small central protrusion contacts the anterior cord and results in mild spinal canal stenosis. C5-6: Disc osteophyte complex with central disc protrusion as well as bilateral uncovertebral and facet hypertrophy. Moderate bilateral foraminal stenosis. The disc protrusion contacts the anterior cord and results in mild spinal canal stenosis. C6-7: Disc osteophyte complex with left-greater-than-right uncovertebral and facet hypertrophy. Mild right and moderate left neural foraminal  stenosis. No significant spinal canal stenosis. C7-T1: No significant disc displacement, foraminal stenosis, or canal stenosis. IMPRESSION: 1. Mild edema within the odontoid process and small soft tissue pannus posterior to the odontoid process. Findings may reflect rheumatoid or crystal deposition disease. 2. No additional osseous or cord signal abnormality. 3. Cervical spondylosis with predominant discogenic degenerative changes greatest at the C4-5 and C5-6 levels. 4. Moderate to severe left C4-5 foraminal stenosis. Multilevel mild and moderate foraminal stenosis. 5. Mild C4-5 and C5-6 spinal canal stenosis. No high-grade spinal canal stenosis. Electronically Signed   By: Kristine Garbe M.D.   On: 12/04/2018 17:14   US Carotid Bilateral (at [facility] Only)  Result Date: 12/04/2018 CLINICAL DATA:  CVA EXAM: BILATERAL CAROTID DUPLEX ULTRASOUND TECHNIQUE: Pearline Cables scale imaging, color Doppler and duplex ultrasound were performed of bilateral carotid and vertebral arteries in the neck. COMPARISON:  09/14/2016 FINDINGS: Criteria: Quantification of carotid stenosis is based on velocity parameters that correlate the residual internal carotid diameter with NASCET-based stenosis levels, using the diameter of the distal internal carotid lumen as the denominator for stenosis measurement. The following velocity measurements were obtained: RIGHT ICA: 84 cm/sec CCA: 65 cm/sec SYSTOLIC ICA/CCA RATIO:  1.3 ECA: 73 cm/sec LEFT ICA: 56 cm/sec CCA: 66 cm/sec SYSTOLIC ICA/CCA RATIO:  0.9 ECA: 53 cm/sec RIGHT CAROTID ARTERY: Little if any plaque in the bulb. Low resistance internal carotid Doppler pattern is preserved. RIGHT VERTEBRAL ARTERY:  Antegrade. LEFT CAROTID ARTERY: Little if any plaque in the bulb. Low resistance internal carotid Doppler pattern is preserved. LEFT VERTEBRAL ARTERY:  Antegrade. IMPRESSION: Less than 50% stenosis in the right and left internal carotid arteries. Electronically Signed   By: Marybelle Killings M.D.   On: 12/04/2018 14:52   Ct Head Code Stroke Wo Contrast`  Result Date: 12/05/2018 CLINICAL DATA:  Code stroke.  68 y/o  M; new altered mental status. EXAM: CT HEAD WITHOUT CONTRAST TECHNIQUE: Contiguous axial images were obtained from the base of the skull through the vertex without intravenous contrast. COMPARISON:  12/04/2018 MRI of the head. FINDINGS: Brain: No evidence of acute infarction, hemorrhage, hydrocephalus, extra-axial collection or mass lesion/mass effect. Stable small chronic infarctions are present within the right anterior putamen, bilateral thalami left posterior external capsule. Stable left anterior temporal lobe encephalomalacia. Stable nonspecific white matter hypodensities compatible with chronic microvascular ischemic changes and stable volume loss of the brain. Vascular: Calcific atherosclerosis of carotid siphons. No hyperdense vessel identified. Skull: Normal. Negative for fracture or focal lesion. Sinuses/Orbits: Left phthisis bulbi. Right intra-ocular lens replacement. Normal aeration of visible paranasal sinuses and the mastoid air cells. Other: None. ASPECTS [facility] Stroke Program Early CT Score) - Ganglionic level infarction (caudate, lentiform nuclei, internal capsule, insula, M1-M3 cortex): - Supraganglionic infarction (M4-M6 cortex): Total score (0-10 with  10 being normal): IMPRESSION: 1. No acute intracranial abnormality identified. 2. Stable chronic microvascular ischemic changes and volume loss of the brain. 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 Stable encephalomalacia and left anterior temporal lobe. 3. ASPECTS is 10 These results were called by telephone at the time of interpretation on 12/05/2018 at 6:48 pm to NP [facility] , who verbally acknowledged these results. Electronically Signed   By: Kristine Garbe M.D.   On: 12/05/2018 18:52    Cardiac Studies   Echocardiogram done yesterday:  1. The left ventricle has moderate-severely  reduced systolic function, with an ejection fraction of 30-35% of 30%%. The cavity size was mildly dilated. There is moderately increased left ventricular wall thickness. Left ventricular diastolic Doppler  parameters are consistent with impaired relaxation Left ventricular diffuse hypokinesis.  2. The right ventricle has normal systolic function. The cavity was normal. There is no increase in right ventricular wall thickness. Normal RVSP  3. There is dilatation of the aortic root, 3.5 cm.  Patient Profile     68 y.o. male with history of hypertension, chronic kidney disease and orthostatic hypotension who presented with acute left-sided weakness and was found to have mild elevated troponin.  Assessment & Plan    1.  Mildly elevated troponin likely supply demand ischemia.  No evidence of acute coronary syndrome.  The patient does not need to be on a heparin drip from a cardiac standpoint.  No plans for ischemic cardiac work-up at the present time.  2.  Systolic heart failure: EF of 30 to 35% by echo which is new compared to his previous echocardiogram in 2018. No ACE inhibitor or ARB due to acute on chronic renal failure. We can consider switching amlodipine to carvedilol. The patient will need outpatient cardiac follow-up with consideration of nuclear stress testing to evaluate his cardiomyopathy. Cardiac catheterization should be reserved for high risk ischemia given underlying chronic kidney disease.  3.  Acute on chronic renal failure: The exact etiology is not entirely clear.  4.  Orthostatic hypotension: We will avoid too many antihypertensive medications for now.  Continue to monitor.       For questions or updates, please contact [place] Please consult [email or web address] for contact info under        Signed, Kathlyn Sacramento, MD  12/06/2018, 8:48 AM

## 2018-12-06 NOTE — Progress Notes (Signed)
Report given to [facility] RN for patient to be transferred to room 124. Patients daughter called and was updated of new room.

## 2018-12-06 NOTE — Progress Notes (Signed)
SLP Cancellation Note  Patient Details Name: Cameron Adams MRN: [id number] DOB: May 22, 1951   Cancelled treatment:       Reason Eval/Treat Not Completed: SLP screened, no needs identified, will sign off; Chart reviewed. Nsg consulted. Nsg reports pt tolerating current Regular diet with thin liquids with no overt s/s aspiration. Pt denies any s/s dysphagia or aspiration. Nsg reports pt effectively verbally communicates needs and wants without problem. Pt denies difficulty thinking of his words, but reports he feels his speech is slow. Pt observed to be lethargic, closing eyes as speaking to SLP. MRI negative for acute CVA. No acute needs identified at this time, SLP to sign off. Nsg to re-consult with any future change in status requiring re-assessment.   Prince Couey, MA, CCC-SLP 12/06/2018, 11:30 AM

## 2018-12-06 NOTE — Progress Notes (Signed)
Subjective: Patient had new stroke like symptoms on 12/05/2018. Per nursing report, patient was found in the toilet with altered mental status, right side weakness and slurred speech. Code stroke was initiated and patient was evaluated by a tele-neurologist. Initial NIHSS Score: 4. A non-contrast CT head was obtained and showed no acute intracranial abnormality. Patient was not deemed candidate for tPA thrombolytics because of Coagulopathy. Follow up MRI brain was also negative. Patient was transferred to the ICU for further monitoring. He is alert this morning and oriented x4. No focal neurologic deficit noted.   Objective: Current vital signs: BP (!) 196/87   Pulse 70   Temp 97.9 F (36.6 C) (Oral)   Resp 19   Ht 5\' 10"  (1.778 m)   Wt 67.2 kg   SpO2 97%   BMI 21.26 kg/m  Vital signs in last 24 hours: Temp:  [97.6 F (36.4 C)-98.2 F (36.8 C)] 97.9 F (36.6 C) (03/02 0748) Pulse Rate:  [66-87] 70 (03/02 0748) Resp:  [9-33] 19 (03/02 0900) BP: (114-226)/(61-103) 196/87 (03/02 0900) SpO2:  [97 %-100 %] 97 % (03/02 0748)  Intake/Output from previous day: 03/01 0701 - 03/02 0700 In: 467.4 [P.O.:240; I.V.:167.3; IV Piggyback:60.1] Out: -  Intake/Output this shift: Total I/O In: 294.7 [P.O.:240; I.V.:22.5; IV Piggyback:32.2] Out: 300 [Urine:300] Nutritional status:  Diet Order            Diet heart healthy/carb modified Room service appropriate? Yes; Fluid consistency: Thin  Diet effective now             Neurological Examination   Mental Status: Alert, oriented, thought content appropriate.  Speech fluent without evidence of aphasia.  Mils dysarthria noted but patient without teeth.  Able to follow 3 step commands without difficulty. Cranial Nerves: II: Right disc flat; Visual fields grossly normal on the right, right pupil reactive to light. 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) III,IV, VI: ptosis not present, extra-ocular motions intact  bilaterally V,VII: mild left facial droop, facial light touch sensation normal bilaterally VIII: hearing normal bilaterally IX,X: gag reflex present XI: bilateral shoulder shrug XII: midline tongue extension Motor: Right :  Upper extremity   5/5                                      Left:     Upper extremity   5/5             Lower extremity   5/5                                                  Lower extremity   4+/5 Tone and bulk:normal tone throughout; no atrophy noted Sensory: Pinprick and light touch intact throughout, bilaterally Deep Tendon Reflexes: 1+ and symmetric with absent AJ's bilaterally Plantars: Right: mute                              Left: mute Cerebellar: Normal finger-to-nose and normal heel-to-shin testing bilaterally Gait: not tested due to safety concerns   Lab Results: Basic Metabolic Panel: Recent Labs  Lab 12/04/18 0435 12/04/18 0840 12/05/18 0858 12/05/18 2226 12/06/18 0429  NA 138  --  136 141 142  K 4.8  --  4.7 4.9 5.0  CL 114*  --  113* 111 114*  CO2 18*  --  18* 21* 21*  GLUCOSE 121*  --  171* 90 119*  BUN 47*  --  50* 50* 50*  CREATININE 2.86*  --  2.76* 2.62* 2.58*  CALCIUM 8.9  --  8.6* 8.5* 8.4*  MG  --  1.4*  --  1.4* 1.4*  PHOS  --  4.1  --  3.6 3.1    Liver Function Tests: Recent Labs  Lab 12/04/18 0435 12/05/18 2226  AST 31 31  ALT 52* 47*  ALKPHOS 93 91  BILITOT 0.7 0.6  PROT 7.0 6.7  ALBUMIN 3.7 3.5   No results for input(s): LIPASE, AMYLASE in the last 168 hours. Recent Labs  Lab 12/05/18 2226  AMMONIA 33    CBC: Recent Labs  Lab 12/04/18 0435 12/05/18 0453 12/05/18 2226 12/06/18 0429  WBC 4.3 3.9* 4.5 5.2  NEUTROABS 2.6  --   --   --   HGB 11.6* 10.5* 10.8* 10.2*  HCT 35.4* 32.1* 32.3* 30.2*  MCV 75.6* 76.4* 73.9* 74.4*  PLT 118* 108* 108* 103*    Cardiac Enzymes: Recent Labs  Lab 12/04/18 0435 12/04/18 0840 12/04/18 1109 12/05/18 2226  TROPONINI 0.51* 0.49* 0.52* 0.40*    Lipid  Panel: Recent Labs  Lab 12/05/18 0453  CHOL 102  TRIG 40  HDL 48  CHOLHDL 2.1  VLDL 8  LDLCALC 46    CBG: Recent Labs  Lab 12/04/18 1157 12/05/18 2321 12/06/18 0343 12/06/18 0802  GLUCAP 91 85 109* 104*    Microbiology: Results for orders placed or performed during the hospital encounter of 12/04/18  Gastrointestinal Panel by PCR , Stool     Status: None   Collection Time: 12/05/18  2:45 PM  Result Value Ref Range Status   Campylobacter species NOT DETECTED NOT DETECTED Final   Plesimonas shigelloides NOT DETECTED NOT DETECTED Final   Salmonella species NOT DETECTED NOT DETECTED Final   Yersinia enterocolitica NOT DETECTED NOT DETECTED Final   Vibrio species NOT DETECTED NOT DETECTED Final   Vibrio cholerae NOT DETECTED NOT DETECTED Final   Enteroaggregative E coli (EAEC) NOT DETECTED NOT DETECTED Final   Enteropathogenic E coli (EPEC) NOT DETECTED NOT DETECTED Final   Enterotoxigenic E coli (ETEC) NOT DETECTED NOT DETECTED Final   Shiga like toxin producing E coli (STEC) NOT DETECTED NOT DETECTED Final   Shigella/Enteroinvasive E coli (EIEC) NOT DETECTED NOT DETECTED Final   Cryptosporidium NOT DETECTED NOT DETECTED Final   Cyclospora cayetanensis NOT DETECTED NOT DETECTED Final   Entamoeba histolytica NOT DETECTED NOT DETECTED Final   Giardia lamblia NOT DETECTED NOT DETECTED Final   Adenovirus F40/41 NOT DETECTED NOT DETECTED Final   Astrovirus NOT DETECTED NOT DETECTED Final   Norovirus GI/GII NOT DETECTED NOT DETECTED Final   Rotavirus A NOT DETECTED NOT DETECTED Final   Sapovirus (I, II, IV, and V) NOT DETECTED NOT DETECTED Final    Comment: Performed at [facility], [address] [place], [place] [address]  MRSA PCR Screening     Status: None   Collection Time: 12/05/18  7:49 PM  Result Value Ref Range Status   MRSA by PCR NEGATIVE NEGATIVE Final    Comment:        The GeneXpert MRSA Assay (FDA approved for NASAL specimens only), is one  component of a comprehensive MRSA colonization surveillance program. It is not intended to diagnose MRSA infection nor to  guide or monitor treatment for MRSA infections. Performed at [facility], [address] [place], [place] [address]     Coagulation Studies: Recent Labs    12/04/18 0435  LABPROT 14.0  INR 1.1    Imaging: [name] Contrast  Result Date: 12/05/2018 CLINICAL DATA:  Initial evaluation for acute stroke, right-sided weakness with slurred speech. EXAM: MRI HEAD WITHOUT CONTRAST TECHNIQUE: Multiplanar, multiecho pulse sequences of the brain and surrounding structures were obtained without intravenous contrast. COMPARISON:  Prior CT from earlier the same day as well as previous MRI from 12/04/2018. FINDINGS: Brain: Generalized age-related cerebral atrophy with moderate chronic small vessel ischemic disease, stable. Multiple scatter remote lacunar infarcts seen involving the bilateral thalami and right pons. Encephalomalacia with gliosis at the anterior left temporal pole likely related to remote traumatic injury. No abnormal foci of restricted diffusion to suggest acute or subacute ischemia. Faint diffusion abnormality seen at the level of the right internal capsule on axial DWI sequence image 30 felt to be related to T2 shine through. Gray-white matter differentiation maintained. No encephalomalacia to suggest chronic cortical infarction. No acute intracranial hemorrhage. Single subcentimeter focus susceptibility fact within the high right frontal lobe compatible with a small chronic microhemorrhage, of doubtful significance in isolation. No mass lesion, midline shift or mass effect. No hydrocephalus. No extra-axial fluid collection. Pituitary gland suprasellar region normal. Midline structures intact and normal. Vascular: Major intracranial vascular flow voids are maintained. Skull and upper cervical spine: Prominent degenerative changes noted about the C1-2  articulation. Craniocervical junction otherwise unremarkable. Bone marrow signal intensity within normal limits. No scalp soft tissue abnormality. Sinuses/Orbits: Chronic changes at the left globe noted. Patient status post ocular lens replacement on the right. Paranasal sinuses are clear. Trace bilateral mastoid effusions, of doubtful significance. Inner ear structures grossly normal. Other: None. IMPRESSION: 1. No acute intracranial abnormality. 2. Generalized age-related cerebral atrophy with moderate chronic small vessel ischemic disease with multiple small remote lacunar infarcts involving the bilateral thalami and pons. 3. Encephalomalacia at the anterior left temporal pole, which could reflect sequelae of chronic infarct or posttraumatic injury. Electronically Signed   By: Jeannine Boga M.D.   On: 12/05/2018 21:46   Mr Cervical Spine Wo Contrast  Result Date: 12/04/2018 CLINICAL DATA:  68 y/o M; recurrent episodes of left arm numbness and weakness. EXAM: MRI CERVICAL SPINE WITHOUT CONTRAST TECHNIQUE: Multiplanar, multisequence MR imaging of the cervical spine was performed. No intravenous contrast was administered. COMPARISON:  07/15/2006 neck radiographs. FINDINGS: Alignment: Physiologic. Vertebrae: Mild edema within the odontoid process and small soft tissue pannus posterior to the odontoid process. Cord: Normal signal and morphology. Posterior Fossa, vertebral arteries, paraspinal tissues: Negative. Disc levels: C2-3: Small right foraminal disc protrusion with mild right foraminal stenosis. No spinal canal stenosis. C3-4: Disc osteophyte complex with bilateral uncovertebral and facet hypertrophy resulting in moderate right and mild left foraminal stenosis. No significant spinal canal stenosis. C4-5: Disc osteophyte complex with left-greater-than-right uncovertebral and facet hypertrophy. Moderate right and moderate to severe left foraminal stenosis. There is a small central protrusion contacts  the anterior cord and results in mild spinal canal stenosis. C5-6: Disc osteophyte complex with central disc protrusion as well as bilateral uncovertebral and facet hypertrophy. Moderate bilateral foraminal stenosis. The disc protrusion contacts the anterior cord and results in mild spinal canal stenosis. C6-7: Disc osteophyte complex with left-greater-than-right uncovertebral and facet hypertrophy. Mild right and moderate left neural foraminal stenosis. No significant spinal canal stenosis. C7-T1: No significant disc displacement,  foraminal stenosis, or canal stenosis. IMPRESSION: 1. Mild edema within the odontoid process and small soft tissue pannus posterior to the odontoid process. Findings may reflect rheumatoid or crystal deposition disease. 2. No additional osseous or cord signal abnormality. 3. Cervical spondylosis with predominant discogenic degenerative changes greatest at the C4-5 and C5-6 levels. 4. Moderate to severe left C4-5 foraminal stenosis. Multilevel mild and moderate foraminal stenosis. 5. Mild C4-5 and C5-6 spinal canal stenosis. No high-grade spinal canal stenosis. Electronically Signed   By: Kristine Garbe M.D.   On: 12/04/2018 17:14   US Carotid Bilateral (at [facility] Only)  Result Date: 12/04/2018 CLINICAL DATA:  CVA EXAM: BILATERAL CAROTID DUPLEX ULTRASOUND TECHNIQUE: Pearline Cables scale imaging, color Doppler and duplex ultrasound were performed of bilateral carotid and vertebral arteries in the neck. COMPARISON:  09/14/2016 FINDINGS: Criteria: Quantification of carotid stenosis is based on velocity parameters that correlate the residual internal carotid diameter with NASCET-based stenosis levels, using the diameter of the distal internal carotid lumen as the denominator for stenosis measurement. The following velocity measurements were obtained: RIGHT ICA: 84 cm/sec CCA: 65 cm/sec SYSTOLIC ICA/CCA RATIO:  1.3 ECA: 73 cm/sec LEFT ICA: 56 cm/sec CCA: 66 cm/sec SYSTOLIC ICA/CCA  RATIO:  0.9 ECA: 53 cm/sec RIGHT CAROTID ARTERY: Little if any plaque in the bulb. Low resistance internal carotid Doppler pattern is preserved. RIGHT VERTEBRAL ARTERY:  Antegrade. LEFT CAROTID ARTERY: Little if any plaque in the bulb. Low resistance internal carotid Doppler pattern is preserved. LEFT VERTEBRAL ARTERY:  Antegrade. IMPRESSION: Less than 50% stenosis in the right and left internal carotid arteries. Electronically Signed   By: Marybelle Killings M.D.   On: 12/04/2018 14:52   Ct Head Code Stroke Wo Contrast`  Result Date: 12/05/2018 CLINICAL DATA:  Code stroke.  68 y/o  M; new altered mental status. EXAM: CT HEAD WITHOUT CONTRAST TECHNIQUE: Contiguous axial images were obtained from the base of the skull through the vertex without intravenous contrast. COMPARISON:  12/04/2018 MRI of the head. FINDINGS: Brain: No evidence of acute infarction, hemorrhage, hydrocephalus, extra-axial collection or mass lesion/mass effect. Stable small chronic infarctions are present within the right anterior putamen, bilateral thalami left posterior external capsule. Stable left anterior temporal lobe encephalomalacia. Stable nonspecific white matter hypodensities compatible with chronic microvascular ischemic changes and stable volume loss of the brain. Vascular: Calcific atherosclerosis of carotid siphons. No hyperdense vessel identified. Skull: Normal. Negative for fracture or focal lesion. Sinuses/Orbits: Left phthisis bulbi. Right intra-ocular lens replacement. Normal aeration of visible paranasal sinuses and the mastoid air cells. Other: None. ASPECTS [facility] Stroke Program Early CT Score) - Ganglionic level infarction (caudate, lentiform nuclei, internal capsule, insula, M1-M3 cortex): - Supraganglionic infarction (M4-M6 cortex): Total score (0-10 with 10 being normal): IMPRESSION: 1. No acute intracranial abnormality identified. 2. Stable chronic microvascular ischemic changes and volume loss of the brain. Stable  small chronic infarcts of basal ganglia. Stable encephalomalacia and left anterior temporal lobe. 3. ASPECTS is 10 These results were called by telephone at the time of interpretation on 12/05/2018 at 6:48 pm to NP [facility] , who verbally acknowledged these results. Electronically Signed   By: Kristine Garbe M.D.   On: 12/05/2018 18:52    Medications:  I have reviewed the patient's current medications. Prior to Admission:  Medications Prior to Admission  Medication Sig Dispense Refill Last Dose  . amLODipine (NORVASC) 5 MG tablet Take 5 mg by mouth daily.    prn at prn  . aspirin EC 81 MG  tablet Take 81 mg by mouth daily.   12/03/2018 at 0800  . atorvastatin (LIPITOR) 40 MG tablet Take 40 mg by mouth at bedtime.   Past Week at 2000  . Cholecalciferol (VITAMIN D-1000 MAX ST) 25 MCG (1000 UT) tablet Take 2,000 Units by mouth daily.    12/03/2018 at 0800  . citalopram (CELEXA) 20 MG tablet Take 20 mg by mouth daily.   12/03/2018 at 0800  . furosemide (LASIX) 80 MG tablet Take 80 mg by mouth.    12/03/2018 at Unknown time  . glipiZIDE (GLUCOTROL) 5 MG tablet Take 2.5 mg by mouth daily before breakfast.    12/03/2018 at 0800  . hydroxypropyl methylcellulose / hypromellose (ISOPTO TEARS / GONIOVISC) 2.5 % ophthalmic solution Place 1 drop into both eyes 3 (three) times daily as needed for dry eyes.   prn at prn  . levothyroxine (SYNTHROID, LEVOTHROID) 200 MCG tablet Take 1 tablet (200 mcg total) by mouth daily before breakfast. 30 tablet 0 12/03/2018 at 0600  . lipase/protease/amylase (CREON) 36000 UNITS CPEP capsule Take 2 capsules (72,000 Units total) by mouth 3 (three) times daily with meals. 270 capsule 0 12/03/2018 at 1800  . loperamide (IMODIUM) 2 MG capsule Take 1 capsule (2 mg total) by mouth every 6 (six) hours as needed for diarrhea or loose stools. 30 capsule 0 prn at prn   Scheduled: . amLODipine  5 mg Oral Daily  . aspirin  81 mg Oral Daily  . atorvastatin  40 mg Oral QHS  .  cholecalciferol  1,000 Units Oral Daily  . citalopram  20 mg Oral Daily  . furosemide  80 mg Oral Daily  . heparin injection (subcutaneous)  5,000 Units Subcutaneous Q8H  . insulin aspart  0-15 Units Subcutaneous TID WC  . insulin aspart  0-5 Units Subcutaneous QHS  . lactobacillus  1 g Oral TID WC  . levothyroxine  100 mcg Oral QAC breakfast  . lipase/protease/amylase  72,000 Units Oral TID WC  . sodium chloride flush  3 mL Intravenous Q12H    Assessment: 68 y.o. male with  Hx of chronic kidney disease, DM, hypertension, Thyrid disease,  Diabetes retinopathy, and CVA  presenting with episodes of left hemiparesis and slurred speech.  MRI of the brain showed  no acute changes although significant small vessel ischemic changes noted. Patient on ASA and statin prior to admission.  Carotid dopplers show no evidence of hemodynamically significant stenosis. MRI of the cervical spine shows degenerative changes and some mild spinal stenosis but nothing to explain the patient's presenting symptoms. Developed new stroke like symptoms of AMS, right sides weakness and slurred speech on 12/05/2018. Code stroke initiated. Initial NIHSS Score: 4. A non-contrast CT head was obtained and showed no acute intracranial abnormality. Patient was not deemed candidate for tPA thrombolytics because of Coagulopathy. Follow up MRI brain was also negative. Echocardiogram shows left ventricle has moderate-severely reduced systolic function, with an ejection fraction of 30-35% of 30%%. No cardiac source of emboli. Symptoms likely due to toxic metabolic in nature in the setting of impaired renal function. Does have right sided weakness at baseline. He appears to be back to baseline with no focality noted.  Recommendations: 1. Start dual therapy Aspirin 81 mg/day and Plavix 75 mg /day with intensive management of vascular risk factor to keep systolic BP (SBP) <102 mm Hg (130 mm Hg if diabetic) 2. Aggressive lipid management with goal  LDL <70 mg/dl 3. Agree with medical management of underlying medical condition 4.  No further neurologic intervention is recommended at this time.  If further questions arise, please call or page at that time.  Thank you for allowing neurology to participate in the care of this patient.  Patient to follow up with neurology on an outpatient basis   This patient was staffed with Dr. Irish Elders, Alease Frame who personally evaluated patient, reviewed documentation and agreed with assessment and plan of care as above.  Rufina Falco, DNP, FNP-BC Board certified Nurse Practitioner Neurology Department    LOS: 2 days   12/06/2018  11:44 AM

## 2018-12-06 NOTE — Progress Notes (Signed)
[facility]  ROUNDING NOTE   Subjective:   Code stroke yesterday. Reported left side weakness.   Patient moved to ICU  Objective:  Vital signs in last 24 hours:  Temp:  [97.6 F (36.4 C)-98.2 F (36.8 C)] 97.9 F (36.6 C) (03/02 0748) Pulse Rate:  [66-87] 70 (03/02 0748) Resp:  [9-33] 19 (03/02 0900) BP: (114-226)/(61-103) 196/87 (03/02 0900) SpO2:  [97 %-100 %] 97 % (03/02 0748)  Weight change:  Filed Weights   12/04/18 0351 12/05/18 0407  Weight: 65.8 kg 67.2 kg    Intake/Output: I/O last 3 completed shifts: In: 492 [P.O.:240; I.V.:191.9; IV Piggyback:60.1] Out: 300 [Urine:300]   Intake/Output this shift:  Total I/O In: 54.7 [I.V.:22.5; IV Piggyback:32.2] Out: -   Physical Exam: General: NAD, laying in bed  Head: Normocephalic, atraumatic. Moist oral mucosal membranes  Eyes: Anicteric, PERRL  Neck: Supple, trachea midline  Lungs:  Clear to auscultation  Heart: Regular rate and rhythm  Abdomen:  Soft, nontender,   Extremities: no peripheral edema.  Neurologic: Strength 5/5 all four extremities  Skin: No lesions        Basic Metabolic Panel: Recent Labs  Lab 12/04/18 0435 12/04/18 0840 12/05/18 0858 12/05/18 2226 12/06/18 0429  NA 138  --  136 141 142  K 4.8  --  4.7 4.9 5.0  CL 114*  --  113* 111 114*  CO2 18*  --  18* 21* 21*  GLUCOSE 121*  --  171* 90 119*  BUN 47*  --  50* 50* 50*  CREATININE 2.86*  --  2.76* 2.62* 2.58*  CALCIUM 8.9  --  8.6* 8.5* 8.4*  MG  --  1.4*  --  1.4* 1.4*  PHOS  --  4.1  --  3.6 3.1    Liver Function Tests: Recent Labs  Lab 12/04/18 0435 12/05/18 2226  AST 31 31  ALT 52* 47*  ALKPHOS 93 91  BILITOT 0.7 0.6  PROT 7.0 6.7  ALBUMIN 3.7 3.5   No results for input(s): LIPASE, AMYLASE in the last 168 hours. Recent Labs  Lab 12/05/18 2226  AMMONIA 33    CBC: Recent Labs  Lab 12/04/18 0435 12/05/18 0453 12/05/18 2226 12/06/18 0429  WBC 4.3 3.9* 4.5 5.2  NEUTROABS 2.6  --   --   --    HGB 11.6* 10.5* 10.8* 10.2*  HCT 35.4* 32.1* 32.3* 30.2*  MCV 75.6* 76.4* 73.9* 74.4*  PLT 118* 108* 108* 103*    Cardiac Enzymes: Recent Labs  Lab 12/04/18 0435 12/04/18 0840 12/04/18 1109 12/05/18 2226  TROPONINI 0.51* 0.49* 0.52* 0.40*    BNP: Invalid input(s): POCBNP  CBG: Recent Labs  Lab 12/04/18 1157 12/05/18 2321 12/06/18 0343 12/06/18 0802  GLUCAP 91 85 109* 104*    Microbiology: Results for orders placed or performed during the hospital encounter of 12/04/18  Gastrointestinal Panel by PCR , Stool     Status: None   Collection Time: 12/05/18  2:45 PM  Result Value Ref Range Status   Campylobacter species NOT DETECTED NOT DETECTED Final   Plesimonas shigelloides NOT DETECTED NOT DETECTED Final   Salmonella species NOT DETECTED NOT DETECTED Final   Yersinia enterocolitica NOT DETECTED NOT DETECTED Final   Vibrio species NOT DETECTED NOT DETECTED Final   Vibrio cholerae NOT DETECTED NOT DETECTED Final   Enteroaggregative E coli (EAEC) NOT DETECTED NOT DETECTED Final   Enteropathogenic E coli (EPEC) NOT DETECTED NOT DETECTED Final   Enterotoxigenic E coli (ETEC) NOT  DETECTED NOT DETECTED Final   Shiga like toxin producing E coli (STEC) NOT DETECTED NOT DETECTED Final   Shigella/Enteroinvasive E coli (EIEC) NOT DETECTED NOT DETECTED Final   Cryptosporidium NOT DETECTED NOT DETECTED Final   Cyclospora cayetanensis NOT DETECTED NOT DETECTED Final   Entamoeba histolytica NOT DETECTED NOT DETECTED Final   Giardia lamblia NOT DETECTED NOT DETECTED Final   Adenovirus F40/41 NOT DETECTED NOT DETECTED Final   Astrovirus NOT DETECTED NOT DETECTED Final   Norovirus GI/GII NOT DETECTED NOT DETECTED Final   Rotavirus A NOT DETECTED NOT DETECTED Final   Sapovirus (I, II, IV, and V) NOT DETECTED NOT DETECTED Final    Comment: Performed at [facility], [address] [place], [place] [address]  MRSA PCR Screening     Status: None   Collection Time: 12/05/18   7:49 PM  Result Value Ref Range Status   MRSA by PCR NEGATIVE NEGATIVE Final    Comment:        The GeneXpert MRSA Assay (FDA approved for NASAL specimens only), is one component of a comprehensive MRSA colonization surveillance program. It is not intended to diagnose MRSA infection nor to guide or monitor treatment for MRSA infections. Performed at [facility], [address] [place], [place] [address]     Coagulation Studies: Recent Labs    12/04/18 0435  LABPROT 14.0  INR 1.1    Urinalysis: Recent Labs    12/04/18 0435  COLORURINE YELLOW*  LABSPEC 1.012  PHURINE 5.0  GLUCOSEU NEGATIVE  HGBUR MODERATE*  BILIRUBINUR NEGATIVE  KETONESUR NEGATIVE  PROTEINUR 100*  NITRITE NEGATIVE  LEUKOCYTESUR NEGATIVE      Imaging: [name] Contrast  Result Date: 12/05/2018 CLINICAL DATA:  Initial evaluation for acute stroke, right-sided weakness with slurred speech. EXAM: MRI HEAD WITHOUT CONTRAST TECHNIQUE: Multiplanar, multiecho pulse sequences of the brain and surrounding structures were obtained without intravenous contrast. COMPARISON:  Prior CT from earlier the same day as well as previous MRI from 12/04/2018. FINDINGS: Brain: Generalized age-related cerebral atrophy with moderate chronic small vessel ischemic disease, stable. Multiple scatter remote lacunar infarcts seen involving the bilateral thalami and right pons. Encephalomalacia with gliosis at the anterior left temporal pole likely related to remote traumatic injury. No abnormal foci of restricted diffusion to suggest acute or subacute ischemia. Faint diffusion abnormality seen at the level of the right internal capsule on axial DWI sequence image 30 felt to be related to T2 shine through. Gray-white matter differentiation maintained. No encephalomalacia to suggest chronic cortical infarction. No acute intracranial hemorrhage. Single subcentimeter focus susceptibility fact within the high right frontal lobe  compatible with a small chronic microhemorrhage, of doubtful significance in isolation. No mass lesion, midline shift or mass effect. No hydrocephalus. No extra-axial fluid collection. Pituitary gland suprasellar region normal. Midline structures intact and normal. Vascular: Major intracranial vascular flow voids are maintained. Skull and upper cervical spine: Prominent degenerative changes noted about the C1-2 articulation. Craniocervical junction otherwise unremarkable. Bone marrow signal intensity within normal limits. No scalp soft tissue abnormality. Sinuses/Orbits: Chronic changes at the left globe noted. Patient status post ocular lens replacement on the right. Paranasal sinuses are clear. Trace bilateral mastoid effusions, of doubtful significance. Inner ear structures grossly normal. Other: None. IMPRESSION: 1. No acute intracranial abnormality. 2. Generalized age-related cerebral atrophy with moderate chronic small vessel ischemic disease with multiple small remote lacunar infarcts involving the bilateral thalami and pons. 3. Encephalomalacia at the anterior left temporal pole, which could reflect  sequelae of chronic infarct or posttraumatic injury. Electronically Signed   By: Jeannine Boga M.D.   On: 12/05/2018 21:46   Mr Cervical Spine Wo Contrast  Result Date: 12/04/2018 CLINICAL DATA:  68 y/o M; recurrent episodes of left arm numbness and weakness. EXAM: MRI CERVICAL SPINE WITHOUT CONTRAST TECHNIQUE: Multiplanar, multisequence MR imaging of the cervical spine was performed. No intravenous contrast was administered. COMPARISON:  07/15/2006 neck radiographs. FINDINGS: Alignment: Physiologic. Vertebrae: Mild edema within the odontoid process and small soft tissue pannus posterior to the odontoid process. Cord: Normal signal and morphology. Posterior Fossa, vertebral arteries, paraspinal tissues: Negative. Disc levels: C2-3: Small right foraminal disc protrusion with mild right foraminal  stenosis. No spinal canal stenosis. C3-4: Disc osteophyte complex with bilateral uncovertebral and facet hypertrophy resulting in moderate right and mild left foraminal stenosis. No significant spinal canal stenosis. C4-5: Disc osteophyte complex with left-greater-than-right uncovertebral and facet hypertrophy. Moderate right and moderate to severe left foraminal stenosis. There is a small central protrusion contacts the anterior cord and results in mild spinal canal stenosis. C5-6: Disc osteophyte complex with central disc protrusion as well as bilateral uncovertebral and facet hypertrophy. Moderate bilateral foraminal stenosis. The disc protrusion contacts the anterior cord and results in mild spinal canal stenosis. C6-7: Disc osteophyte complex with left-greater-than-right uncovertebral and facet hypertrophy. Mild right and moderate left neural foraminal stenosis. No significant spinal canal stenosis. C7-T1: No significant disc displacement, foraminal stenosis, or canal stenosis. IMPRESSION: 1. Mild edema within the odontoid process and small soft tissue pannus posterior to the odontoid process. Findings may reflect rheumatoid or crystal deposition disease. 2. No additional osseous or cord signal abnormality. 3. Cervical spondylosis with predominant discogenic degenerative changes greatest at the C4-5 and C5-6 levels. 4. Moderate to severe left C4-5 foraminal stenosis. Multilevel mild and moderate foraminal stenosis. 5. Mild C4-5 and C5-6 spinal canal stenosis. No high-grade spinal canal stenosis. Electronically Signed   By: Kristine Garbe M.D.   On: 12/04/2018 17:14   US Carotid Bilateral (at [facility] Only)  Result Date: 12/04/2018 CLINICAL DATA:  CVA EXAM: BILATERAL CAROTID DUPLEX ULTRASOUND TECHNIQUE: Pearline Cables scale imaging, color Doppler and duplex ultrasound were performed of bilateral carotid and vertebral arteries in the neck. COMPARISON:  09/14/2016 FINDINGS: Criteria: Quantification of  carotid stenosis is based on velocity parameters that correlate the residual internal carotid diameter with NASCET-based stenosis levels, using the diameter of the distal internal carotid lumen as the denominator for stenosis measurement. The following velocity measurements were obtained: RIGHT ICA: 84 cm/sec CCA: 65 cm/sec SYSTOLIC ICA/CCA RATIO:  1.3 ECA: 73 cm/sec LEFT ICA: 56 cm/sec CCA: 66 cm/sec SYSTOLIC ICA/CCA RATIO:  0.9 ECA: 53 cm/sec RIGHT CAROTID ARTERY: Little if any plaque in the bulb. Low resistance internal carotid Doppler pattern is preserved. RIGHT VERTEBRAL ARTERY:  Antegrade. LEFT CAROTID ARTERY: Little if any plaque in the bulb. Low resistance internal carotid Doppler pattern is preserved. LEFT VERTEBRAL ARTERY:  Antegrade. IMPRESSION: Less than 50% stenosis in the right and left internal carotid arteries. Electronically Signed   By: Marybelle Killings M.D.   On: 12/04/2018 14:52   Ct Head Code Stroke Wo Contrast`  Result Date: 12/05/2018 CLINICAL DATA:  Code stroke.  68 y/o  M; new altered mental status. EXAM: CT HEAD WITHOUT CONTRAST TECHNIQUE: Contiguous axial images were obtained from the base of the skull through the vertex without intravenous contrast. COMPARISON:  12/04/2018 MRI of the head. FINDINGS: Brain: No evidence of acute infarction, hemorrhage, hydrocephalus, extra-axial collection  or mass lesion/mass effect. Stable small chronic infarctions are present within the right anterior putamen, bilateral thalami left posterior external capsule. Stable left anterior temporal lobe encephalomalacia. Stable nonspecific white matter hypodensities compatible with chronic microvascular ischemic changes and stable volume loss of the brain. Vascular: Calcific atherosclerosis of carotid siphons. No hyperdense vessel identified. Skull: Normal. Negative for fracture or focal lesion. Sinuses/Orbits: Left phthisis bulbi. Right intra-ocular lens replacement. Normal aeration of visible paranasal sinuses  and the mastoid air cells. Other: None. ASPECTS [facility] Stroke Program Early CT Score) - Ganglionic level infarction (caudate, lentiform nuclei, internal capsule, insula, M1-M3 cortex): - Supraganglionic infarction (M4-M6 cortex): Total score (0-10 with 10 being normal): IMPRESSION: 1. No acute intracranial abnormality identified. 2. Stable chronic microvascular ischemic changes and volume loss of the brain. 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 Stable encephalomalacia and left anterior temporal lobe. 3. ASPECTS is 10 These results were called by telephone at the time of interpretation on 12/05/2018 at 6:48 pm to NP [facility] , who verbally acknowledged these results. Electronically Signed   By: Kristine Garbe M.D.   On: 12/05/2018 18:52     Medications:   . sodium chloride     . amLODipine  5 mg Oral Daily  . aspirin  81 mg Oral Daily  . atorvastatin  40 mg Oral QHS  . cholecalciferol  1,000 Units Oral Daily  . citalopram  20 mg Oral Daily  . furosemide  80 mg Oral Daily  . glipiZIDE  2.5 mg Oral QAC breakfast  . lactobacillus  1 g Oral TID WC  . levothyroxine  100 mcg Oral QAC breakfast  . lipase/protease/amylase  72,000 Units Oral TID WC  . sodium bicarbonate  650 mg Oral QID  . sodium chloride flush  3 mL Intravenous Q12H   sodium chloride, acetaminophen, ALPRAZolam, hydrALAZINE, hydrOXYzine, loperamide, nitroGLYCERIN, ondansetron (ZOFRAN) IV, polyvinyl alcohol, sodium chloride flush  Assessment/ Plan:  [name] is a 68 y.o. black male with Left eye blindness, diabetes mellitus type 2, hypertension, syncope, and chronic kidney disease, who was admitted to [facility] on 12/04/2018   1. Chronic kidney disease stage IV. Follows with [facility].  History of acute renal failure requiring hemodialylsis for several months. Has been off dialysis since 10/18/18. CVC has been removed.  Chronic kidney disease secondary to diabetic nephropathy and  hypertension Creatinine at baseline.   2. Metabolic acidosis - PO bicarb  3. Hypertension: allow to ride high due to presumed ischemic event.  Echocardiogram with EF of 81-01% with diastolic dysfunction.  - home regimen of fursoemide, amlodipine  4. Diabetes mellitus type II with chronic kidney disease: insulin dependent. History of poor control.    LOS: 2 Hennesy Sobalvarro 3/2/20209:46 AM

## 2018-12-06 NOTE — Progress Notes (Addendum)
[place] [facility] [place] NAME: Bravlio Luca    MR#:  [id number]  DATE OF BIRTH:  [birth year]  SUBJECTIVE:  CHIEF COMPLAINT:   Events of the evening noted-transfer to ICU given concern for possible CVA, neurology/cardiology/intensivist input greatly appreciated, up eating in bed, no apparent distress, possible transfer to the floor later today  REVIEW OF SYSTEMS:  CONSTITUTIONAL: No fever, fatigue or weakness.  EYES: No blurred or double vision.  EARS, NOSE, AND THROAT: No tinnitus or ear pain.  RESPIRATORY: No cough, shortness of breath, wheezing or hemoptysis.  CARDIOVASCULAR: No chest pain, orthopnea, edema.  GASTROINTESTINAL: No nausea, vomiting, diarrhea or abdominal pain.  GENITOURINARY: No dysuria, hematuria.  ENDOCRINE: No polyuria, nocturia,  HEMATOLOGY: No anemia, easy bruising or bleeding SKIN: No rash or lesion. MUSCULOSKELETAL: No joint pain or arthritis.   NEUROLOGIC: No tingling, numbness, weakness.  PSYCHIATRY: No anxiety or depression.   ROS  DRUG ALLERGIES:   Allergies  Allergen Reactions  . Gabapentin     Other reaction(s): Other (See Comments) Trouble sleeping     VITALS:  Blood pressure (!) 196/87, pulse 70, temperature 97.9 F (36.6 C), temperature source Oral, resp. rate 19, height 5\' 10"  (1.778 m), weight 67.2 kg, SpO2 97 %.  PHYSICAL EXAMINATION:  GENERAL:  68 y.o.-year-old patient lying in the bed with no acute distress.  EYES: Pupils equal, round, reactive to light and accommodation. No scleral icterus. Extraocular muscles intact.  HEENT: Head atraumatic, normocephalic. Oropharynx and nasopharynx clear.  NECK:  Supple, no jugular venous distention. No thyroid enlargement, no tenderness.  LUNGS: Normal breath sounds bilaterally, no wheezing, rales,rhonchi or crepitation. No use of accessory muscles of respiration.  CARDIOVASCULAR: S1, S2 normal. No murmurs, rubs, or gallops.  ABDOMEN: Soft, nontender,  nondistended. Bowel sounds present. No organomegaly or mass.  EXTREMITIES: No pedal edema, cyanosis, or clubbing.  NEUROLOGIC: Cranial nerves II through XII are intact. Muscle strength 5/5 in all extremities. Sensation intact. Gait not checked.  PSYCHIATRIC: The patient is alert and oriented x 3.  SKIN: No obvious rash, lesion, or ulcer.   Physical Exam LABORATORY PANEL:   CBC Recent Labs  Lab 12/06/18 0429  WBC 5.2  HGB 10.2*  HCT 30.2*  PLT 103*   ------------------------------------------------------------------------------------------------------------------  Chemistries  Recent Labs  Lab 12/05/18 2226 12/06/18 0429  NA 141 142  K 4.9 5.0  CL 111 114*  CO2 21* 21*  GLUCOSE 90 119*  BUN 50* 50*  CREATININE 2.62* 2.58*  CALCIUM 8.5* 8.4*  MG 1.4* 1.4*  AST 31  --   ALT 47*  --   ALKPHOS 91  --   BILITOT 0.6  --    ------------------------------------------------------------------------------------------------------------------  Cardiac Enzymes Recent Labs  Lab 12/04/18 1109 12/05/18 2226  TROPONINI 0.52* 0.40*   ------------------------------------------------------------------------------------------------------------------  RADIOLOGY:  [name] Contrast  Result Date: 12/05/2018 CLINICAL DATA:  Initial evaluation for acute stroke, right-sided weakness with slurred speech. EXAM: MRI HEAD WITHOUT CONTRAST TECHNIQUE: Multiplanar, multiecho pulse sequences of the brain and surrounding structures were obtained without intravenous contrast. COMPARISON:  Prior CT from earlier the same day as well as previous MRI from 12/04/2018. FINDINGS: Brain: Generalized age-related cerebral atrophy with moderate chronic small vessel ischemic disease, stable. Multiple scatter remote lacunar infarcts seen involving the bilateral thalami and right pons. Encephalomalacia with gliosis at the anterior left temporal pole likely related to remote traumatic injury. No abnormal foci of  restricted diffusion to suggest acute or subacute ischemia. Faint  diffusion abnormality seen at the level of the right internal capsule on axial DWI sequence image 30 felt to be related to T2 shine through. Gray-white matter differentiation maintained. No encephalomalacia to suggest chronic cortical infarction. No acute intracranial hemorrhage. Single subcentimeter focus susceptibility fact within the high right frontal lobe compatible with a small chronic microhemorrhage, of doubtful significance in isolation. No mass lesion, midline shift or mass effect. No hydrocephalus. No extra-axial fluid collection. Pituitary gland suprasellar region normal. Midline structures intact and normal. Vascular: Major intracranial vascular flow voids are maintained. Skull and upper cervical spine: Prominent degenerative changes noted about the C1-2 articulation. Craniocervical junction otherwise unremarkable. Bone marrow signal intensity within normal limits. No scalp soft tissue abnormality. Sinuses/Orbits: Chronic changes at the left globe noted. Patient status post ocular lens replacement on the right. Paranasal sinuses are clear. Trace bilateral mastoid effusions, of doubtful significance. Inner ear structures grossly normal. Other: None. IMPRESSION: 1. No acute intracranial abnormality. 2. Generalized age-related cerebral atrophy with moderate chronic small vessel ischemic disease with multiple small remote lacunar infarcts involving the bilateral thalami and pons. 3. Encephalomalacia at the anterior left temporal pole, which could reflect sequelae of chronic infarct or posttraumatic injury. Electronically Signed   By: Jeannine Boga M.D.   On: 12/05/2018 21:46   Mr Cervical Spine Wo Contrast  Result Date: 12/04/2018 CLINICAL DATA:  68 y/o M; recurrent episodes of left arm numbness and weakness. EXAM: MRI CERVICAL SPINE WITHOUT CONTRAST TECHNIQUE: Multiplanar, multisequence MR imaging of the cervical spine was  performed. No intravenous contrast was administered. COMPARISON:  07/15/2006 neck radiographs. FINDINGS: Alignment: Physiologic. Vertebrae: Mild edema within the odontoid process and small soft tissue pannus posterior to the odontoid process. Cord: Normal signal and morphology. Posterior Fossa, vertebral arteries, paraspinal tissues: Negative. Disc levels: C2-3: Small right foraminal disc protrusion with mild right foraminal stenosis. No spinal canal stenosis. C3-4: Disc osteophyte complex with bilateral uncovertebral and facet hypertrophy resulting in moderate right and mild left foraminal stenosis. No significant spinal canal stenosis. C4-5: Disc osteophyte complex with left-greater-than-right uncovertebral and facet hypertrophy. Moderate right and moderate to severe left foraminal stenosis. There is a small central protrusion contacts the anterior cord and results in mild spinal canal stenosis. C5-6: Disc osteophyte complex with central disc protrusion as well as bilateral uncovertebral and facet hypertrophy. Moderate bilateral foraminal stenosis. The disc protrusion contacts the anterior cord and results in mild spinal canal stenosis. C6-7: Disc osteophyte complex with left-greater-than-right uncovertebral and facet hypertrophy. Mild right and moderate left neural foraminal stenosis. No significant spinal canal stenosis. C7-T1: No significant disc displacement, foraminal stenosis, or canal stenosis. IMPRESSION: 1. Mild edema within the odontoid process and small soft tissue pannus posterior to the odontoid process. Findings may reflect rheumatoid or crystal deposition disease. 2. No additional osseous or cord signal abnormality. 3. Cervical spondylosis with predominant discogenic degenerative changes greatest at the C4-5 and C5-6 levels. 4. Moderate to severe left C4-5 foraminal stenosis. Multilevel mild and moderate foraminal stenosis. 5. Mild C4-5 and C5-6 spinal canal stenosis. No high-grade spinal canal  stenosis. Electronically Signed   By: Kristine Garbe M.D.   On: 12/04/2018 17:14   US Carotid Bilateral (at [facility] Only)  Result Date: 12/04/2018 CLINICAL DATA:  CVA EXAM: BILATERAL CAROTID DUPLEX ULTRASOUND TECHNIQUE: Pearline Cables scale imaging, color Doppler and duplex ultrasound were performed of bilateral carotid and vertebral arteries in the neck. COMPARISON:  09/14/2016 FINDINGS: Criteria: Quantification of carotid stenosis is based on velocity parameters that correlate the  residual internal carotid diameter with NASCET-based stenosis levels, using the diameter of the distal internal carotid lumen as the denominator for stenosis measurement. The following velocity measurements were obtained: RIGHT ICA: 84 cm/sec CCA: 65 cm/sec SYSTOLIC ICA/CCA RATIO:  1.3 ECA: 73 cm/sec LEFT ICA: 56 cm/sec CCA: 66 cm/sec SYSTOLIC ICA/CCA RATIO:  0.9 ECA: 53 cm/sec RIGHT CAROTID ARTERY: Little if any plaque in the bulb. Low resistance internal carotid Doppler pattern is preserved. RIGHT VERTEBRAL ARTERY:  Antegrade. LEFT CAROTID ARTERY: Little if any plaque in the bulb. Low resistance internal carotid Doppler pattern is preserved. LEFT VERTEBRAL ARTERY:  Antegrade. IMPRESSION: Less than 50% stenosis in the right and left internal carotid arteries. Electronically Signed   By: Marybelle Killings M.D.   On: 12/04/2018 14:52   Ct Head Code Stroke Wo Contrast`  Result Date: 12/05/2018 CLINICAL DATA:  Code stroke.  68 y/o  M; new altered mental status. EXAM: CT HEAD WITHOUT CONTRAST TECHNIQUE: Contiguous axial images were obtained from the base of the skull through the vertex without intravenous contrast. COMPARISON:  12/04/2018 MRI of the head. FINDINGS: Brain: No evidence of acute infarction, hemorrhage, hydrocephalus, extra-axial collection or mass lesion/mass effect. Stable small chronic infarctions are present within the right anterior putamen, bilateral thalami left posterior external capsule. Stable left anterior  temporal lobe encephalomalacia. Stable nonspecific white matter hypodensities compatible with chronic microvascular ischemic changes and stable volume loss of the brain. Vascular: Calcific atherosclerosis of carotid siphons. No hyperdense vessel identified. Skull: Normal. Negative for fracture or focal lesion. Sinuses/Orbits: Left phthisis bulbi. Right intra-ocular lens replacement. Normal aeration of visible paranasal sinuses and the mastoid air cells. Other: None. ASPECTS [facility] Stroke Program Early CT Score) - Ganglionic level infarction (caudate, lentiform nuclei, internal capsule, insula, M1-M3 cortex): - Supraganglionic infarction (M4-M6 cortex): Total score (0-10 with 10 being normal): IMPRESSION: 1. No acute intracranial abnormality identified. 2. Stable chronic microvascular ischemic changes and volume loss of the brain. 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 Stable encephalomalacia and left anterior temporal lobe. 3. ASPECTS is 10 These results were called by telephone at the time of interpretation on 12/05/2018 at 6:48 pm to NP [facility] , who verbally acknowledged these results. Electronically Signed   By: Kristine Garbe M.D.   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, PT/OT/physical therapy input appreciated, continue neurochecks per routine   *Acute elevated troponins Stable Clinical exam inconsistent with ACS, CEs inconsistent with ACS Suspect secondary to uncontrolled hypertension/accelerated hypertension from demand ischemia Cardiology put appreciated, consulted for expert opinion, echocardiogram noted for ejection fraction 30-35%, on DAPT, heparin drip discontinued  *CKD IV Stable Continue to avoid  nephrotoxic agents, strict I&O monitoring, daily weights, nephrology following, renal ultrasound negative for any acute process  *Chronic hypertension Stable Continue Norvasc, Lasix  *Acute iatrogenic hyperthyroidism, chronic hypothyroidism Decrease Synthroid to 100 mcg daily We will need to have TSH rechecked in 4 to 6-week status post discharge for further adjustment of primary care provider  *Acute diarrhea Resolved May be related to iatrogenic hypothyroidism-Synthroid adjusted Check GI panel  *Chronic diabetes mellitus type 2 Stable Continue Glucotrol, carbohydrate consistent diet, sliding scale insulin with Accu-Cheks per routine  Disposition pending clinical course   All the records are reviewed and case discussed with  Care Management/Social Workerr. Management plans discussed with the patient, family and they are in agreement.  CODE STATUS: full  TOTAL TIME TAKING CARE OF THIS PATIENT: 35 minutes.    POSSIBLE D/C IN 1-2 DAYS, DEPENDING ON CLINICAL CONDITION.   Avel Peace Owen Pagnotta M.D on 12/06/2018   Between 7am to 6pm - Pager - [phone]  After 6pm go to [email or web address] - password [email or web address] [facility]  Office  [phone]  CC: Primary care physician; [name], MD  Note: This dictation was prepared with Dragon dictation along with smaller phrase technology. Any transcriptional errors that result from this process are unintentional.

## 2018-12-06 NOTE — Plan of Care (Signed)
CT scan and MRI negative for acute infarct.  Pt tolerating diet.  Pt educated on signs and symptoms of stroke.

## 2018-12-06 NOTE — Progress Notes (Signed)
Md Fletcher Anon made aware of orthostatic episode on 12/05/18.

## 2018-12-07 DIAGNOSIS — G459 Transient cerebral ischemic attack, unspecified: Secondary | ICD-10-CM

## 2018-12-07 DIAGNOSIS — I5021 Acute systolic (congestive) heart failure: Secondary | ICD-10-CM

## 2018-12-07 LAB — GLUCOSE, CAPILLARY
Glucose-Capillary: 97 mg/dL (ref 70–99)
Glucose-Capillary: 191 mg/dL — ABNORMAL HIGH (ref 70–99)
Glucose-Capillary: 114 mg/dL — ABNORMAL HIGH (ref 70–99)
Glucose-Capillary: 140 mg/dL — ABNORMAL HIGH (ref 70–99)
Glucose-Capillary: 159 mg/dL — ABNORMAL HIGH (ref 70–99)

## 2018-12-07 LAB — HIV ANTIBODY (ROUTINE TESTING W REFLEX): HIV Screen 4th Generation wRfx: NONREACTIVE

## 2018-12-07 MED ORDER — HYDRALAZINE HCL 10 MG PO TABS
10.0000 mg | ORAL_TABLET | Freq: Three times a day (TID) | ORAL | Status: DC
  Filled 2018-12-07 (×3): qty 1

## 2018-12-07 MED ORDER — ISOSORBIDE DINITRATE 10 MG PO TABS
5.0000 mg | ORAL_TABLET | Freq: Three times a day (TID) | ORAL | Status: DC
  Filled 2018-12-07 (×3): qty 0.5

## 2018-12-07 MED ORDER — HYDROXYZINE HCL 10 MG PO TABS: 10.0000 mg | ORAL_TABLET | Freq: Three times a day (TID) | ORAL | 0 refills | Status: DC | PRN

## 2018-12-07 MED ORDER — LEVOTHYROXINE SODIUM 100 MCG PO TABS: 100.0000 ug | ORAL_TABLET | Freq: Every day | ORAL | 0 refills | Status: AC

## 2018-12-07 MED ORDER — AMLODIPINE BESYLATE 5 MG PO TABS: 5.0000 mg | ORAL_TABLET | Freq: Every day | ORAL | 0 refills | Status: DC

## 2018-12-07 MED ORDER — MIDODRINE HCL 5 MG PO TABS
5.0000 mg | ORAL_TABLET | Freq: Three times a day (TID) | ORAL | Status: DC
  Administered 2018-12-07 (×2): 5 mg via ORAL
  Filled 2018-12-07 (×2): qty 1

## 2018-12-07 MED ORDER — MIDODRINE HCL 5 MG PO TABS
2.5000 mg | ORAL_TABLET | Freq: Three times a day (TID) | ORAL | Status: DC
  Administered 2018-12-08: 2.5 mg via ORAL
  Filled 2018-12-07: qty 1

## 2018-12-07 MED ORDER — CLOPIDOGREL BISULFATE 75 MG PO TABS: 75.0000 mg | ORAL_TABLET | Freq: Every day | ORAL | 0 refills | Status: AC

## 2018-12-07 MED ORDER — ISOSORBIDE DINITRATE 5 MG PO TABS: 5.0000 mg | ORAL_TABLET | Freq: Three times a day (TID) | ORAL | 0 refills | Status: DC

## 2018-12-07 MED ORDER — FLORANEX PO PACK: 1.0000 g | PACK | Freq: Three times a day (TID) | ORAL | 0 refills | Status: DC

## 2018-12-07 MED ORDER — CLOPIDOGREL BISULFATE 75 MG PO TABS
75.0000 mg | ORAL_TABLET | Freq: Every day | ORAL | Status: DC
  Administered 2018-12-07 – 2018-12-08 (×2): 75 mg via ORAL
  Filled 2018-12-07 (×2): qty 1

## 2018-12-07 NOTE — Care Management Important Message (Signed)
Important Message  Patient Details  Name: Cameron Adams MRN: [id number] Date of Birth: Jan 06, 1951   Medicare Important Message Given:  Yes    Juliann Pulse A Yacine Garriga 12/07/2018, 10:47 AM

## 2018-12-07 NOTE — Progress Notes (Signed)
Physical therapy did orthostatics-positive, blood pressure in the 60s with standing, will hold discharge, start Midodrine, family updated.

## 2018-12-07 NOTE — Evaluation (Signed)
Physical Therapy Evaluation Patient Details Name: Cameron Adams MRN: [id number] DOB: [birth year] Today's Date: 12/07/2018   History of Present Illness  presented to ER secondary to acute onset of L-sided weakness, facial droop with symptoms that wax/wane; admitted for TIA/CVA work up. MRI imaging negative (both at admission and after RR episode).  Noted with mild elevation in troponin, likely demand per cardiology.    Clinical Impression  Upon evaluation, patient alert and oriented; follows simple commands and demonstrates good effort with mobility tasks.  Bilat UE/LE strength and ROM grossly symmetrical and WFL; no significant focal weakness appreciated.  Able to complete bed mobility indep; maintains static sitting balance with close sup (mild sway, increased reports of dizziness); sit/stand with RW, min assist.  Unable to maintain stance >5 seconds due to significant dizziness/lightheadedness.  Unsafe for additional OOB/gait efforts as result.  Will continue to assess/progress as medically appropriate. Of note, significant orthostasis noted with transition to upright.  Supine BP 122/68, HR 67; sitting BP 70/52, HR 71; sitting x5 minutes BP 75/42, HR 71; attempted standing (unable to tolerate for full BP assessment) BP 67/52, HR 75; return to supine BP 89/57, HR 74.  RN/MD informed/aware. Would benefit from skilled PT to address above deficits and promote optimal return to PLOF; Recommend transition to [place] upon discharge from acute hospitalization when medical problems resolved and patient safe for continued mobility.    Follow Up Recommendations Home health PT(when medically appropriate)    Equipment Recommendations  (has SPC and RW)    Recommendations for Other Services       Precautions / Restrictions Precautions Precautions: Fall Restrictions Weight Bearing Restrictions: No      Mobility  Bed Mobility Overal bed mobility: Modified Independent                 Transfers Overall transfer level: Needs assistance Equipment used: Rolling walker (2 wheeled) Transfers: Sit to/from Stand Sit to Stand: Min assist         General transfer comment: unable to tolerate >5 seconds due to significant dizziness/lightheadedness  Ambulation/Gait             General Gait Details: unsafe/unable due to symptomatic orthostasis  Stairs            Wheelchair Mobility    Modified Rankin (Stroke Patients Only)       Balance Overall balance assessment: Needs assistance Sitting-balance support: No upper extremity supported;Feet supported Sitting balance-Leahy Scale: Fair     Standing balance support: Bilateral upper extremity supported Standing balance-Leahy Scale: Poor                               Pertinent Vitals/Pain Pain Assessment: No/denies pain    Home Living Family/patient expects to be discharged to:: Private residence Living Arrangements: Children Available Help at Discharge: Family;Available PRN/intermittently   Home Access: Level entry     Home Layout: Two level;Bed/bath upstairs Home Equipment: Walker - 2 wheels;Cane - single point;Wheelchair - manual      Prior Function Level of Independence: Independent with assistive device(s)         Comments: Mod indep with intermittent use of SPC vs RW as needed; does endorse multiple fall history in previous six months (at least 6)     Hand Dominance   Dominant Hand: Right    Extremity/Trunk Assessment   Upper Extremity Assessment Upper Extremity Assessment: Generalized weakness    Lower Extremity Assessment Lower  Extremity Assessment: Generalized weakness(grossly 4/5 throughout; no significant focal weakness appreciated)       Communication   Communication: No difficulties  Cognition Arousal/Alertness: Awake/alert Behavior During Therapy: WFL for tasks assessed/performed Overall Cognitive Status: Within Functional Limits for tasks assessed                                         General Comments      Exercises Other Exercises Other Exercises: Supine BP 122/68, HR 67; sitting BP 70/52, HR 71; sitting x5 minutes BP 75/42, HR 71; attempted standing (unable to tolerate for full BP assessment) BP 67/52, HR 75; return to supine BP 89/57, HR 74   Assessment/Plan    PT Assessment Patient needs continued PT services  PT Problem List Decreased strength;Decreased balance;Decreased activity tolerance;Decreased mobility;Decreased knowledge of use of DME;Decreased knowledge of precautions;Decreased safety awareness;Cardiopulmonary status limiting activity       PT Treatment Interventions DME instruction;Stair training;Gait training;Functional mobility training;Therapeutic activities;Therapeutic exercise;Balance training;Patient/family education    PT Goals (Current goals can be found in the Care Plan section)  Acute Rehab PT Goals Patient Stated Goal: to return home PT Goal Formulation: With patient Time For Goal Achievement: 12/21/18 Potential to Achieve Goals: Good    Frequency Min 2X/week   Barriers to discharge Decreased caregiver support      Co-evaluation               AM-PAC PT "6 Clicks" Mobility  Outcome Measure Help needed turning from your back to your side while in a flat bed without using bedrails?: None Help needed moving from lying on your back to sitting on the side of a flat bed without using bedrails?: None Help needed moving to and from a bed to a chair (including a wheelchair)?: A Little Help needed standing up from a chair using your arms (e.g., wheelchair or bedside chair)?: Total Help needed to walk in hospital room?: Total Help needed climbing 3-5 steps with a railing? : Total 6 Click Score: 14    End of Session Equipment Utilized During Treatment: Gait belt Activity Tolerance: Treatment limited secondary to medical complications (Comment)(significant orthostasis with  transition to upright) Patient left: in bed;with call bell/phone within reach;with bed alarm set Nurse Communication: Mobility status PT Visit Diagnosis: Muscle weakness (generalized) (M62.81);Difficulty in walking, not elsewhere classified (R26.2)    Time: 2426-8341 PT Time Calculation (min) (ACUTE ONLY): 16 min   Charges:   PT Evaluation $PT Eval High Complexity: 1 High         Carra Brindley H. Owens Shark, PT, DPT, NCS 12/07/18, 11:39 AM [phone]

## 2018-12-07 NOTE — Progress Notes (Signed)
[facility]  ROUNDING NOTE   Subjective:   No new labs  Orthostatic - started on midodrine  Objective:  Vital signs in last 24 hours:  Temp:  [97.6 F (36.4 C)-98.3 F (36.8 C)] 98.2 F (36.8 C) (03/03 0747) Pulse Rate:  [61-78] 68 (03/03 0846) Resp:  [9-18] 16 (03/03 0747) BP: (112-185)/(58-82) 118/65 (03/03 0846) SpO2:  [99 %-100 %] 99 % (03/03 0747)  Weight change:  Filed Weights   12/04/18 0351 12/05/18 0407  Weight: 65.8 kg 67.2 kg    Intake/Output: I/O last 3 completed shifts: In: 522.1 [P.O.:240; I.V.:189.8; IV Piggyback:92.3] Out: 700 [Urine:700]   Intake/Output this shift:  No intake/output data recorded.  Physical Exam: General: NAD, laying in bed  Head: Normocephalic, atraumatic. Moist oral mucosal membranes  Eyes: Anicteric, PERRL  Neck: Supple, trachea midline  Lungs:  Clear to auscultation  Heart: Regular rate and rhythm  Abdomen:  Soft, nontender,   Extremities: no peripheral edema.  Neurologic: Strength 5/5 all four extremities  Skin: No lesions        Basic Metabolic Panel: Recent Labs  Lab 12/04/18 0435 12/04/18 0840 12/05/18 0858 12/05/18 2226 12/06/18 0429  NA 138  --  136 141 142  K 4.8  --  4.7 4.9 5.0  CL 114*  --  113* 111 114*  CO2 18*  --  18* 21* 21*  GLUCOSE 121*  --  171* 90 119*  BUN 47*  --  50* 50* 50*  CREATININE 2.86*  --  2.76* 2.62* 2.58*  CALCIUM 8.9  --  8.6* 8.5* 8.4*  MG  --  1.4*  --  1.4* 1.4*  PHOS  --  4.1  --  3.6 3.1    Liver Function Tests: Recent Labs  Lab 12/04/18 0435 12/05/18 2226  AST 31 31  ALT 52* 47*  ALKPHOS 93 91  BILITOT 0.7 0.6  PROT 7.0 6.7  ALBUMIN 3.7 3.5   No results for input(s): LIPASE, AMYLASE in the last 168 hours. Recent Labs  Lab 12/05/18 2226  AMMONIA 33    CBC: Recent Labs  Lab 12/04/18 0435 12/05/18 0453 12/05/18 2226 12/06/18 0429  WBC 4.3 3.9* 4.5 5.2  NEUTROABS 2.6  --   --   --   HGB 11.6* 10.5* 10.8* 10.2*  HCT 35.4* 32.1* 32.3*  30.2*  MCV 75.6* 76.4* 73.9* 74.4*  PLT 118* 108* 108* 103*    Cardiac Enzymes: Recent Labs  Lab 12/04/18 0435 12/04/18 0840 12/04/18 1109 12/05/18 2226  TROPONINI 0.51* 0.49* 0.52* 0.40*    BNP: Invalid input(s): POCBNP  CBG: Recent Labs  Lab 12/06/18 1251 12/06/18 1636 12/06/18 2129 12/07/18 0748 12/07/18 1135  GLUCAP 128* 97 162* 191* 114*    Microbiology: Results for orders placed or performed during the hospital encounter of 12/04/18  Gastrointestinal Panel by PCR , Stool     Status: None   Collection Time: 12/05/18  2:45 PM  Result Value Ref Range Status   Campylobacter species NOT DETECTED NOT DETECTED Final   Plesimonas shigelloides NOT DETECTED NOT DETECTED Final   Salmonella species NOT DETECTED NOT DETECTED Final   Yersinia enterocolitica NOT DETECTED NOT DETECTED Final   Vibrio species NOT DETECTED NOT DETECTED Final   Vibrio cholerae NOT DETECTED NOT DETECTED Final   Enteroaggregative E coli (EAEC) NOT DETECTED NOT DETECTED Final   Enteropathogenic E coli (EPEC) NOT DETECTED NOT DETECTED Final   Enterotoxigenic E coli (ETEC) NOT DETECTED NOT DETECTED Final   Shiga  like toxin producing E coli (STEC) NOT DETECTED NOT DETECTED Final   Shigella/Enteroinvasive E coli (EIEC) NOT DETECTED NOT DETECTED Final   Cryptosporidium NOT DETECTED NOT DETECTED Final   Cyclospora cayetanensis NOT DETECTED NOT DETECTED Final   Entamoeba histolytica NOT DETECTED NOT DETECTED Final   Giardia lamblia NOT DETECTED NOT DETECTED Final   Adenovirus F40/41 NOT DETECTED NOT DETECTED Final   Astrovirus NOT DETECTED NOT DETECTED Final   Norovirus GI/GII NOT DETECTED NOT DETECTED Final   Rotavirus A NOT DETECTED NOT DETECTED Final   Sapovirus (I, II, IV, and V) NOT DETECTED NOT DETECTED Final    Comment: Performed at [facility], [address] [place], [place] [address]  MRSA PCR Screening     Status: None   Collection Time: 12/05/18  7:49 PM  Result Value Ref  Range Status   MRSA by PCR NEGATIVE NEGATIVE Final    Comment:        The GeneXpert MRSA Assay (FDA approved for NASAL specimens only), is one component of a comprehensive MRSA colonization surveillance program. It is not intended to diagnose MRSA infection nor to guide or monitor treatment for MRSA infections. Performed at [facility], [address] [place], [place] [address]     Coagulation Studies: No results for input(s): LABPROT, INR in the last 72 hours.  Urinalysis: No results for input(s): COLORURINE, LABSPEC, PHURINE, GLUCOSEU, HGBUR, BILIRUBINUR, KETONESUR, PROTEINUR, UROBILINOGEN, NITRITE, LEUKOCYTESUR in the last 72 hours.  Invalid input(s): APPERANCEUR    Imaging: [name] Contrast  Result Date: 12/05/2018 CLINICAL DATA:  Initial evaluation for acute stroke, right-sided weakness with slurred speech. EXAM: MRI HEAD WITHOUT CONTRAST TECHNIQUE: Multiplanar, multiecho pulse sequences of the brain and surrounding structures were obtained without intravenous contrast. COMPARISON:  Prior CT from earlier the same day as well as previous MRI from 12/04/2018. FINDINGS: Brain: Generalized age-related cerebral atrophy with moderate chronic small vessel ischemic disease, stable. Multiple scatter remote lacunar infarcts seen involving the bilateral thalami and right pons. Encephalomalacia with gliosis at the anterior left temporal pole likely related to remote traumatic injury. No abnormal foci of restricted diffusion to suggest acute or subacute ischemia. Faint diffusion abnormality seen at the level of the right internal capsule on axial DWI sequence image 30 felt to be related to T2 shine through. Gray-white matter differentiation maintained. No encephalomalacia to suggest chronic cortical infarction. No acute intracranial hemorrhage. Single subcentimeter focus susceptibility fact within the high right frontal lobe compatible with a small chronic microhemorrhage, of  doubtful significance in isolation. No mass lesion, midline shift or mass effect. No hydrocephalus. No extra-axial fluid collection. Pituitary gland suprasellar region normal. Midline structures intact and normal. Vascular: Major intracranial vascular flow voids are maintained. Skull and upper cervical spine: Prominent degenerative changes noted about the C1-2 articulation. Craniocervical junction otherwise unremarkable. Bone marrow signal intensity within normal limits. No scalp soft tissue abnormality. Sinuses/Orbits: Chronic changes at the left globe noted. Patient status post ocular lens replacement on the right. Paranasal sinuses are clear. Trace bilateral mastoid effusions, of doubtful significance. Inner ear structures grossly normal. Other: None. IMPRESSION: 1. No acute intracranial abnormality. 2. Generalized age-related cerebral atrophy with moderate chronic small vessel ischemic disease with multiple small remote lacunar infarcts involving the bilateral thalami and pons. 3. Encephalomalacia at the anterior left temporal pole, which could reflect sequelae of chronic infarct or posttraumatic injury. Electronically Signed   By: Jeannine Boga M.D.   On: 12/05/2018 21:46   Ct Head Code  Stroke Wo Contrast`  Result Date: 12/05/2018 CLINICAL DATA:  Code stroke.  68 y/o  M; new altered mental status. EXAM: CT HEAD WITHOUT CONTRAST TECHNIQUE: Contiguous axial images were obtained from the base of the skull through the vertex without intravenous contrast. COMPARISON:  12/04/2018 MRI of the head. FINDINGS: Brain: No evidence of acute infarction, hemorrhage, hydrocephalus, extra-axial collection or mass lesion/mass effect. Stable small chronic infarctions are present within the right anterior putamen, bilateral thalami left posterior external capsule. Stable left anterior temporal lobe encephalomalacia. Stable nonspecific white matter hypodensities compatible with chronic microvascular ischemic changes and  stable volume loss of the brain. Vascular: Calcific atherosclerosis of carotid siphons. No hyperdense vessel identified. Skull: Normal. Negative for fracture or focal lesion. Sinuses/Orbits: Left phthisis bulbi. Right intra-ocular lens replacement. Normal aeration of visible paranasal sinuses and the mastoid air cells. Other: None. ASPECTS [facility] Stroke Program Early CT Score) - Ganglionic level infarction (caudate, lentiform nuclei, internal capsule, insula, M1-M3 cortex): - Supraganglionic infarction (M4-M6 cortex): Total score (0-10 with 10 being normal): IMPRESSION: 1. No acute intracranial abnormality identified. 2. Stable chronic microvascular ischemic changes and volume loss of the brain. 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 Stable encephalomalacia and left anterior temporal lobe. 3. ASPECTS is 10 These results were called by telephone at the time of interpretation on 12/05/2018 at 6:48 pm to NP [facility] , who verbally acknowledged these results. Electronically Signed   By: Kristine Garbe M.D.   On: 12/05/2018 18:52     Medications:   . sodium chloride     . aspirin  81 mg Oral Daily  . atorvastatin  40 mg Oral QHS  . cholecalciferol  1,000 Units Oral Daily  . citalopram  20 mg Oral Daily  . clopidogrel  75 mg Oral Daily  . furosemide  80 mg Oral Daily  . heparin injection (subcutaneous)  5,000 Units Subcutaneous Q8H  . hydrALAZINE  10 mg Oral Q8H  . insulin aspart  0-15 Units Subcutaneous TID WC  . insulin aspart  0-5 Units Subcutaneous QHS  . isosorbide dinitrate  5 mg Oral Q8H  . lactobacillus  1 g Oral TID WC  . levothyroxine  100 mcg Oral QAC breakfast  . lipase/protease/amylase  72,000 Units Oral TID WC  . midodrine  5 mg Oral TID WC  . sodium chloride flush  3 mL Intravenous Q12H   sodium chloride, acetaminophen, ALPRAZolam, hydrOXYzine, loperamide, nitroGLYCERIN, ondansetron (ZOFRAN) IV, polyvinyl alcohol, sodium chloride flush  Assessment/ Plan:   [name] is a 68 y.o. black male with Left eye blindness, diabetes mellitus type 2, hypertension, syncope, and chronic kidney disease, who was admitted to [facility] on 12/04/2018   1. Chronic kidney disease stage IV. Follows with [facility].  History of acute renal failure requiring hemodialylsis for several months. Has been off dialysis since 10/18/18. CVC has been removed.  Chronic kidney disease secondary to diabetic nephropathy and hypertension Creatinine at baseline.   2. Metabolic acidosis - PO bicarb  3. Hypotension: allow to ride high due to presumed ischemic event.  Echocardiogram with EF of 16-01% with diastolic dysfunction.  - home regimen of fursoemide, amlodipine - started midodrine.   4. Diabetes mellitus type II with chronic kidney disease: insulin dependent. History of poor control.    LOS: 3 Cameron Adams 3/3/202012:46 PM

## 2018-12-07 NOTE — Discharge Summary (Signed)
[facility] [place] NAME: Cameron Adams    MR#:  [id number]  DATE OF BIRTH:  Jun 21, 1951  DATE OF ADMISSION:  12/04/2018 ADMITTING PHYSICIAN: Gorden Harms, MD  DATE OF DISCHARGE: No discharge date for patient encounter.  PRIMARY CARE PHYSICIAN: [name], MD    ADMISSION DIAGNOSIS:  Weakness [R53.1] CVA (cerebral vascular accident) ([place]) [I63.9] Renal insufficiency [N28.9] Elevated troponin [R79.89] AKI (acute kidney injury) ([place]) [N17.9] Cerebrovascular accident (CVA), unspecified mechanism ([place]) [I63.9]  DISCHARGE DIAGNOSIS:  Active Problems:   AKI (acute kidney injury) ([place])   Weakness   SECONDARY DIAGNOSIS:   Past Medical History:  Diagnosis Date  . Anemia of chronic disease   . Blindness of left eye   . Chronic kidney disease, stage III (moderate) (HCC)   . Diabetes ([place])   . Diarrhea   . Diastolic dysfunction    a. 06/2017 Echo: EF 55-60%, no rwma, Gr2 DD, Asc Ao 3.9cm, nl RV fxn, nl PASP.  Marland Kitchen Hypertension   . Hypothyroidism   . Monoclonal gammopathy of unknown significance (MGUS)    a. prev seen @ [facility].  Lost to f/u - never had BM bx.  . Orthostatic hypotension   . 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, PT/OT/physical therapy did see patient while in house, patient did well, for discharge to home in the care of daughter with all questions answered with home health services   *Acute elevated troponins Stable Clinical exam inconsistent with ACS,CEsinconsistent with ACS Suspect secondary to uncontrolled hypertension/accelerated hypertension from demand ischemia Cardiology  did see patient while in house-no intervention was  recommended, echocardiogram noted for ejection fraction 30-35%, on DAPT, heparin drip discontinued  *CKD IV Stable Avoid nephrotoxic agents while in house, nephrology did see patient while in house,  renal ultrasound negative for any acute process  *Chronic hypertension Stable Continued Norvasc, Lasix  *Acute iatrogenic hyperthyroidism, chronic hypothyroidism Decreased Synthroid to 100 mcg daily We will need to have TSH rechecked in 4 to 6-week status post discharge for further adjustment of primary care provider  *Acute diarrhea Resolved May be related to iatrogenic hypothyroidism-Synthroid adjusted  *Chronic diabetes mellitus type 2 Stable Continue Glucotrol, carbohydrate consistent diet, sliding scale insulin with Accu-Cheks per routine  *Chronic anxiety Stable Continue current regiment We will have patient follow-up with psychiatry in 1 to 2 weeks for reevaluation  DISCHARGE CONDITIONS:   stable  CONSULTS OBTAINED:  Treatment Team:  Catarina Hartshorn, MD Alexis Goodell, MD Deboraha Sprang, MD Lavonia Dana, MD  DRUG ALLERGIES:   Allergies  Allergen Reactions  . Gabapentin     Other reaction(s): Other (See Comments) Trouble sleeping     DISCHARGE MEDICATIONS:   Allergies as of 12/07/2018      Reactions   Gabapentin    Other reaction(s): Other (See Comments) Trouble sleeping       Medication List    TAKE these medications   amLODipine 5 MG tablet Commonly known as:  NORVASC Take 1 tablet (5 mg total) by mouth daily.   aspirin EC 81 MG tablet Take 81 mg by mouth daily.   atorvastatin 40 MG tablet Commonly known as:  LIPITOR Take 40 mg by mouth at bedtime.  citalopram 20 MG tablet Commonly known as:  CELEXA Take 20 mg by mouth daily.   clopidogrel 75 MG tablet Commonly known as:  PLAVIX Take 1 tablet (75 mg total) by mouth daily.   furosemide 80 MG tablet Commonly known as:  LASIX Take 80 mg by mouth.   glipiZIDE 5 MG  tablet Commonly known as:  GLUCOTROL Take 2.5 mg by mouth daily before breakfast.   hydroxypropyl methylcellulose / hypromellose 2.5 % ophthalmic solution Commonly known as:  ISOPTO TEARS / GONIOVISC Place 1 drop into both eyes 3 (three) times daily as needed for dry eyes.   hydrOXYzine 10 MG tablet Commonly known as:  ATARAX/VISTARIL Take 1 tablet (10 mg total) by mouth every 8 (eight) hours as needed for anxiety. What changed:  reasons to take this   isosorbide dinitrate 5 MG tablet Commonly known as:  ISORDIL Take 1 tablet (5 mg total) by mouth every 8 (eight) hours.   lactobacillus Pack Take 1 packet (1 g total) by mouth 3 (three) times daily with meals.   levothyroxine 100 MCG tablet Commonly known as:  SYNTHROID, LEVOTHROID Take 1 tablet (100 mcg total) by mouth daily before breakfast. Start taking on:  December 08, 2018 What changed:    medication strength  how much to take   lipase/protease/amylase 36000 UNITS Cpep capsule Commonly known as:  CREON Take 2 capsules (72,000 Units total) by mouth 3 (three) times daily with meals.   loperamide 2 MG capsule Commonly known as:  IMODIUM Take 1 capsule (2 mg total) by mouth every 6 (six) hours as needed for diarrhea or loose stools.   VITAMIN D-1000 MAX ST 25 MCG (1000 UT) tablet Generic drug:  Cholecalciferol Take 2,000 Units by mouth daily.        DISCHARGE INSTRUCTIONS:  If you experience worsening of your admission symptoms, develop shortness of breath, life threatening emergency, suicidal or homicidal thoughts you must seek medical attention immediately by calling 911 or calling your MD immediately  if symptoms less severe.  You Must read complete instructions/literature along with all the possible adverse reactions/side effects for all the Medicines you take and that have been prescribed to you. Take any new Medicines after you have completely understood and accept all the possible adverse reactions/side effects.    Please note  You were cared for by a hospitalist during your hospital stay. If you have any questions about your discharge medications or the care you received while you were in the hospital after you are discharged, you can call the unit and asked to speak with the hospitalist on call if the hospitalist that took care of you is not available. Once you are discharged, your primary care physician will handle any further medical issues. Please note that NO REFILLS for any discharge medications will be authorized once you are discharged, as it is imperative that you return to your primary care physician (or establish a relationship with a primary care physician if you do not have one) for your aftercare needs so that they can reassess your need for medications and monitor your lab values.    Today   CHIEF COMPLAINT:  No chief complaint on file.   HISTORY OF PRESENT ILLNESS:   68 y.o. male with a known history per below presented to the emergency room with acute left-sided weakness, facial droop that started at 6 PM on yesterday, symptoms have been waxing and waning, did return earlier this morning-once again resolved, in the emergency room patient was  noted to have elevated troponin of 0.  5 1, creatinine 2.8 with baseline around 1.8, MRI of the brain noted for old infarcts/no acute process, renal ultrasound was negative for any acute process, patient started on heparin drip, hospitalist asked to evaluate/admit, patient evaluated in the emergency room, no apparent distress, resting comfortably in bed, denies any weakness/paresthesias, patient is now be admitted for acute left-sided weakness/facial droop which have resolved, acute kidney injury with chronic kidney disease stage III, and elevated troponins with uncontrolled hypertension.  VITAL SIGNS:  Blood pressure 118/65, pulse 68, temperature 98.3 F (36.8 C), temperature source Oral, resp. rate 18, height 5\' 10"  (1.778 m), weight 67.2 kg, SpO2  100 %.  I/O:    Intake/Output Summary (Last 24 hours) at 12/07/2018 1029 Last data filed at 12/06/2018 1400 Gross per 24 hour  Intake 0 ml  Output 400 ml  Net -400 ml    PHYSICAL EXAMINATION:  GENERAL:  68 y.o.-year-old patient lying in the bed with no acute distress.  EYES: Pupils equal, round, reactive to light and accommodation. No scleral icterus. Extraocular muscles intact.  HEENT: Head atraumatic, normocephalic. Oropharynx and nasopharynx clear.  NECK:  Supple, no jugular venous distention. No thyroid enlargement, no tenderness.  LUNGS: Normal breath sounds bilaterally, no wheezing, rales,rhonchi or crepitation. No use of accessory muscles of respiration.  CARDIOVASCULAR: S1, S2 normal. No murmurs, rubs, or gallops.  ABDOMEN: Soft, non-tender, non-distended. Bowel sounds present. No organomegaly or mass.  EXTREMITIES: No pedal edema, cyanosis, or clubbing.  NEUROLOGIC: Cranial nerves II through XII are intact. Muscle strength 5/5 in all extremities. Sensation intact. Gait not checked.  PSYCHIATRIC: The patient is alert and oriented x 3.  SKIN: No obvious rash, lesion, or ulcer.   DATA REVIEW:   CBC Recent Labs  Lab 12/06/18 0429  WBC 5.2  HGB 10.2*  HCT 30.2*  PLT 103*    Chemistries  Recent Labs  Lab 12/05/18 2226 12/06/18 0429  NA 141 142  K 4.9 5.0  CL 111 114*  CO2 21* 21*  GLUCOSE 90 119*  BUN 50* 50*  CREATININE 2.62* 2.58*  CALCIUM 8.5* 8.4*  MG 1.4* 1.4*  AST 31  --   ALT 47*  --   ALKPHOS 91  --   BILITOT 0.6  --     Cardiac Enzymes Recent Labs  Lab 12/05/18 2226  TROPONINI 0.40*    Microbiology Results  Results for orders placed or performed during the hospital encounter of 12/04/18  Gastrointestinal Panel by PCR , Stool     Status: None   Collection Time: 12/05/18  2:45 PM  Result Value Ref Range Status   Campylobacter species NOT DETECTED NOT DETECTED Final   Plesimonas shigelloides NOT DETECTED NOT DETECTED Final   Salmonella  species NOT DETECTED NOT DETECTED Final   Yersinia enterocolitica NOT DETECTED NOT DETECTED Final   Vibrio species NOT DETECTED NOT DETECTED Final   Vibrio cholerae NOT DETECTED NOT DETECTED Final   Enteroaggregative E coli (EAEC) NOT DETECTED NOT DETECTED Final   Enteropathogenic E coli (EPEC) NOT DETECTED NOT DETECTED Final   Enterotoxigenic E coli (ETEC) NOT DETECTED NOT DETECTED Final   Shiga like toxin producing E coli (STEC) NOT DETECTED NOT DETECTED Final   Shigella/Enteroinvasive E coli (EIEC) NOT DETECTED NOT DETECTED Final   Cryptosporidium NOT DETECTED NOT DETECTED Final   Cyclospora cayetanensis NOT DETECTED NOT DETECTED Final   Entamoeba histolytica NOT DETECTED NOT DETECTED Final   Giardia lamblia NOT DETECTED NOT  DETECTED Final   Adenovirus F40/41 NOT DETECTED NOT DETECTED Final   Astrovirus NOT DETECTED NOT DETECTED Final   Norovirus GI/GII NOT DETECTED NOT DETECTED Final   Rotavirus A NOT DETECTED NOT DETECTED Final   Sapovirus (I, II, IV, and V) NOT DETECTED NOT DETECTED Final    Comment: Performed at [facility], [address] [place], [place] [address]  MRSA PCR Screening     Status: None   Collection Time: 12/05/18  7:49 PM  Result Value Ref Range Status   MRSA by PCR NEGATIVE NEGATIVE Final    Comment:        The GeneXpert MRSA Assay (FDA approved for NASAL specimens only), is one component of a comprehensive MRSA colonization surveillance program. It is not intended to diagnose MRSA infection nor to guide or monitor treatment for MRSA infections. Performed at [facility], [address] [place], [place] [address]     RADIOLOGY:  Mr Herby Abraham Contrast  Result Date: 12/05/2018 CLINICAL DATA:  Initial evaluation for acute stroke, right-sided weakness with slurred speech. EXAM: MRI HEAD WITHOUT CONTRAST TECHNIQUE: Multiplanar, multiecho pulse sequences of the brain and surrounding structures were obtained without intravenous contrast.  COMPARISON:  Prior CT from earlier the same day as well as previous MRI from 12/04/2018. FINDINGS: Brain: Generalized age-related cerebral atrophy with moderate chronic small vessel ischemic disease, stable. Multiple scatter remote lacunar infarcts seen involving the bilateral thalami and right pons. Encephalomalacia with gliosis at the anterior left temporal pole likely related to remote traumatic injury. No abnormal foci of restricted diffusion to suggest acute or subacute ischemia. Faint diffusion abnormality seen at the level of the right internal capsule on axial DWI sequence image 30 felt to be related to T2 shine through. Gray-white matter differentiation maintained. No encephalomalacia to suggest chronic cortical infarction. No acute intracranial hemorrhage. Single subcentimeter focus susceptibility fact within the high right frontal lobe compatible with a small chronic microhemorrhage, of doubtful significance in isolation. No mass lesion, midline shift or mass effect. No hydrocephalus. No extra-axial fluid collection. Pituitary gland suprasellar region normal. Midline structures intact and normal. Vascular: Major intracranial vascular flow voids are maintained. Skull and upper cervical spine: Prominent degenerative changes noted about the C1-2 articulation. Craniocervical junction otherwise unremarkable. Bone marrow signal intensity within normal limits. No scalp soft tissue abnormality. Sinuses/Orbits: Chronic changes at the left globe noted. Patient status post ocular lens replacement on the right. Paranasal sinuses are clear. Trace bilateral mastoid effusions, of doubtful significance. Inner ear structures grossly normal. Other: None. IMPRESSION: 1. No acute intracranial abnormality. 2. Generalized age-related cerebral atrophy with moderate chronic small vessel ischemic disease with multiple small remote lacunar infarcts involving the bilateral thalami and pons. 3. Encephalomalacia at the anterior left  temporal pole, which could reflect sequelae of chronic infarct or posttraumatic injury. Electronically Signed   By: Jeannine Boga M.D.   On: 12/05/2018 21:46   Ct Head Code Stroke Wo Contrast`  Result Date: 12/05/2018 CLINICAL DATA:  Code stroke.  68 y/o  M; new altered mental status. EXAM: CT HEAD WITHOUT CONTRAST TECHNIQUE: Contiguous axial images were obtained from the base of the skull through the vertex without intravenous contrast. COMPARISON:  12/04/2018 MRI of the head. FINDINGS: Brain: No evidence of acute infarction, hemorrhage, hydrocephalus, extra-axial collection or mass lesion/mass effect. Stable small chronic infarctions are present within the right anterior putamen, bilateral thalami left posterior external capsule. Stable left anterior temporal lobe encephalomalacia. Stable nonspecific white matter hypodensities compatible with  chronic microvascular ischemic changes and stable volume loss of the brain. Vascular: Calcific atherosclerosis of carotid siphons. No hyperdense vessel identified. Skull: Normal. Negative for fracture or focal lesion. Sinuses/Orbits: Left phthisis bulbi. Right intra-ocular lens replacement. Normal aeration of visible paranasal sinuses and the mastoid air cells. Other: None. ASPECTS [facility] Stroke Program Early CT Score) - Ganglionic level infarction (caudate, lentiform nuclei, internal capsule, insula, M1-M3 cortex): - Supraganglionic infarction (M4-M6 cortex): Total score (0-10 with 10 being normal): IMPRESSION: 1. No acute intracranial abnormality identified. 2. Stable chronic microvascular ischemic changes and volume loss of the brain. 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 Stable encephalomalacia and left anterior temporal lobe. 3. ASPECTS is 10 These results were called by telephone at the time of interpretation on 12/05/2018 at 6:48 pm to NP [facility] , who verbally acknowledged these results. Electronically Signed   By: Kristine Garbe  M.D.   On: 12/05/2018 18:52    EKG:   Orders placed or performed during the hospital encounter of 12/04/18  . ED EKG  . ED EKG  . EKG 12-Lead  . EKG 12-Lead  . EKG 12-Lead  . EKG 12-Lead  . EKG 12-Lead  . EKG 12-Lead  . EKG 12-Lead  . EKG 12-Lead  . EKG 12-Lead  . EKG 12-Lead  . EKG 12-Lead      Management plans discussed with the patient, family and they are in agreement.  CODE STATUS:     Code Status Orders  (From admission, onward)         Start     Ordered   12/04/18 1354  Full code  Continuous     12/04/18 1353        Code Status History    Date Active Date Inactive Code Status Order ID Comments User Context   06/22/2017 1735 06/29/2017 2042 Full Code [id number]  Vaughan Basta, MD Inpatient   06/16/2017 1849 06/19/2017 1841 Full Code [id number]  Vaughan Basta, MD Inpatient   05/17/2017 1550 05/20/2017 1744 Full Code [id number]  Idelle Crouch, MD Inpatient   09/13/2016 1756 09/16/2016 1856 Full Code [id number]  Fritzi Mandes, MD Inpatient      TOTAL TIME TAKING CARE OF THIS PATIENT: 40 minutes.    Avel Peace Thomasa Heidler M.D on 12/07/2018 at 10:29 AM  Between 7am to 6pm - Pager - [phone]  After 6pm go to [email or web address] - password [email or web address] [facility]  Office  [phone]  CC: Primary care physician; [name], MD   Note: This dictation was prepared with Dragon dictation along with smaller phrase technology. Any transcriptional errors that result from this process are unintentional.

## 2018-12-07 NOTE — Progress Notes (Signed)
Progress Note  Patient Name: Cameron Adams Date of Encounter: 12/07/2018  Primary Cardiologist: Fletcher Anon  Subjective   Feels well this morning. No chest pain or SOB. Continues to note some orthostatic dizziness. Planning to go home today. No labs today.   Inpatient Medications    Scheduled Meds: . aspirin  81 mg Oral Daily  . atorvastatin  40 mg Oral QHS  . cholecalciferol  1,000 Units Oral Daily  . citalopram  20 mg Oral Daily  . furosemide  80 mg Oral Daily  . heparin injection (subcutaneous)  5,000 Units Subcutaneous Q8H  . hydrALAZINE  10 mg Oral Q8H  . insulin aspart  0-15 Units Subcutaneous TID WC  . insulin aspart  0-5 Units Subcutaneous QHS  . isosorbide dinitrate  5 mg Oral Q8H  . lactobacillus  1 g Oral TID WC  . levothyroxine  100 mcg Oral QAC breakfast  . lipase/protease/amylase  72,000 Units Oral TID WC  . sodium chloride flush  3 mL Intravenous Q12H   Continuous Infusions: . sodium chloride     PRN Meds: sodium chloride, acetaminophen, ALPRAZolam, hydrOXYzine, loperamide, nitroGLYCERIN, ondansetron (ZOFRAN) IV, polyvinyl alcohol, sodium chloride flush   Vital Signs    Vitals:   12/06/18 1640 12/06/18 1936 12/07/18 0428 12/07/18 0846  BP: (!) 112/58 (!) 158/72 (!) 177/67 118/65  Pulse: 61 67 78 68  Resp: 16 18 18    Temp: 97.6 F (36.4 C) 98 F (36.7 C) 98.3 F (36.8 C)   TempSrc: Oral Oral Oral   SpO2: 100% 99% 100%   Weight:      Height:        Intake/Output Summary (Last 24 hours) at 12/07/2018 1043 Last data filed at 12/06/2018 1400 Gross per 24 hour  Intake 0 ml  Output 400 ml  Net -400 ml   Filed Weights   12/04/18 0351 12/05/18 0407  Weight: 65.8 kg 67.2 kg    Telemetry    NSR - Personally Reviewed  ECG    n/a - Personally Reviewed  Physical Exam   GEN: No acute distress.   Neck: No JVD. Cardiac: RRR, no murmurs, rubs, or gallops.  Respiratory: Clear to auscultation bilaterally.  GI: Soft, nontender, non-distended.     MS: No edema; No deformity. Neuro:  Alert and oriented x 3; Nonfocal.  Psych: Normal affect.  Labs    Chemistry Recent Labs  Lab 12/04/18 0435 12/05/18 0858 12/05/18 2226 12/06/18 0429  NA 138 136 141 142  K 4.8 4.7 4.9 5.0  CL 114* 113* 111 114*  CO2 18* 18* 21* 21*  GLUCOSE 121* 171* 90 119*  BUN 47* 50* 50* 50*  CREATININE 2.86* 2.76* 2.62* 2.58*  CALCIUM 8.9 8.6* 8.5* 8.4*  PROT 7.0  --  6.7  --   ALBUMIN 3.7  --  3.5  --   AST 31  --  31  --   ALT 52*  --  47*  --   ALKPHOS 93  --  91  --   BILITOT 0.7  --  0.6  --   GFRNONAA 22* 23* 24* 24*  GFRAA 25* 26* 28* 28*  ANIONGAP 6 5 9 7      Hematology Recent Labs  Lab 12/05/18 0453 12/05/18 2226 12/06/18 0429  WBC 3.9* 4.5 5.2  RBC 4.20* 4.37 4.06*  HGB 10.5* 10.8* 10.2*  HCT 32.1* 32.3* 30.2*  MCV 76.4* 73.9* 74.4*  MCH 25.0* 24.7* 25.1*  MCHC 32.7 33.4 33.8  RDW  17.2* 17.2* 17.1*  PLT 108* 108* 103*    Cardiac Enzymes Recent Labs  Lab 12/04/18 0435 12/04/18 0840 12/04/18 1109 12/05/18 2226  TROPONINI 0.51* 0.49* 0.52* 0.40*   No results for input(s): TROPIPOC in the last 168 hours.   BNPNo results for input(s): BNP, PROBNP in the last 168 hours.   DDimer No results for input(s): DDIMER in the last 168 hours.   Radiology    [name] Contrast  Result Date: 12/05/2018 IMPRESSION: 1. No acute intracranial abnormality. 2. Generalized age-related cerebral atrophy with moderate chronic small vessel ischemic disease with multiple small remote lacunar infarcts involving the bilateral thalami and pons. 3. Encephalomalacia at the anterior left temporal pole, which could reflect sequelae of chronic infarct or posttraumatic injury. Electronically Signed   By: Jeannine Boga M.D.   On: 12/05/2018 21:46   Ct Head Code Stroke Wo Contrast`  Result Date: 12/05/2018 IMPRESSION: 1. No acute intracranial abnormality identified. 2. Stable chronic microvascular ischemic changes and volume loss of the brain.  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 Stable encephalomalacia and left anterior temporal lobe. 3. ASPECTS is 10 These results were called by telephone at the time of interpretation on 12/05/2018 at 6:48 pm to NP [facility] , who verbally acknowledged these results. Electronically Signed   By: Kristine Garbe M.D.   On: 12/05/2018 18:52    Cardiac Studies   Echocardiogram 12/05/2018: 1. The left ventricle has moderate-severely reduced systolic function, with an ejection fraction of 30-35% of 30%%. The cavity size was mildly dilated. There is moderately increased left ventricular wall thickness. Left ventricular diastolic Doppler  parameters are consistent with impaired relaxation Left ventricular diffuse hypokinesis. 2. The right ventricle has normal systolic function. The cavity was normal. There is no increase in right ventricular wall thickness. Normal RVSP 3. There is dilatation of the aortic root, 3.5 cm.  Patient Profile     68 y.o. male with history of hypertension, chronic kidney disease and orthostatic hypotension who presented with acute left-sided weakness and was found to have mild elevated troponin.  Assessment & Plan    1. Mildly elevated troponin: -No evidence of ACS -No plans for inpatient ischemic evaluation -ASA -Lipitor -Consider outpatient Myoview as below  2. Acute systolic CHF: -He appears well compensated and euvolemic  -Stop amlodipine -Start hydralazine and Isordil  -In follow up, consider addition of Coreg -Not on ACEi/ARB given CKD -Consider Myoview as an outpatient to further evaluate his cardiomyopathy -Would reserve cath for high risk ischemia given his CKD  3. Acute on CKD: -Uncertain etiology -Per IM  4. Orthostatic hypotension: -Use low dose medications for HF at this time -Slow positional changes  For questions or updates, please contact [place] Please consult [email or web address] for contact info under  Cardiology/STEMI.    Signed, Christell Faith, PA-C [place] Pager: [phone] 12/07/2018, 10:43 AM

## 2018-12-07 NOTE — Care Management (Signed)
Had anticipated discharge today with home health but found that patient was orthostatic and symtomatic during PT evaluation with systolic blood pressures dropping into the low 70's. Started on Midodrine

## 2018-12-08 DIAGNOSIS — I951 Orthostatic hypotension: Secondary | ICD-10-CM

## 2018-12-08 LAB — RENAL FUNCTION PANEL
Albumin: 3.3 g/dL — ABNORMAL LOW (ref 3.5–5.0)
Anion gap: 9 (ref 5–15)
BUN: 54 mg/dL — ABNORMAL HIGH (ref 8–23)
CALCIUM: 8.6 mg/dL — AB (ref 8.9–10.3)
CO2: 20 mmol/L — ABNORMAL LOW (ref 22–32)
Chloride: 109 mmol/L (ref 98–111)
Creatinine, Ser: 2.59 mg/dL — ABNORMAL HIGH (ref 0.61–1.24)
GFR calc Af Amer: 28 mL/min — ABNORMAL LOW (ref >60)
GFR calc non Af Amer: 24 mL/min — ABNORMAL LOW (ref >60)
Glucose, Bld: 129 mg/dL — ABNORMAL HIGH (ref 70–99)
Phosphorus: 3.5 mg/dL (ref 2.5–4.6)
Potassium: 5.4 mmol/L — ABNORMAL HIGH (ref 3.5–5.1)
SODIUM: 138 mmol/L (ref 135–145)

## 2018-12-08 LAB — GLUCOSE, CAPILLARY
Glucose-Capillary: 143 mg/dL — ABNORMAL HIGH (ref 70–99)
Glucose-Capillary: 112 mg/dL — ABNORMAL HIGH (ref 70–99)
Glucose-Capillary: 279 mg/dL — ABNORMAL HIGH (ref 70–99)

## 2018-12-08 MED ORDER — ISOSORBIDE DINITRATE 10 MG PO TABS
5.0000 mg | ORAL_TABLET | Freq: Three times a day (TID) | ORAL | Status: DC
  Administered 2018-12-08: 09:00:00 5 mg via ORAL
  Filled 2018-12-08 (×3): qty 0.5

## 2018-12-08 MED ORDER — SODIUM CHLORIDE 0.9 % IV BOLUS
250.0000 mL | Freq: Once | INTRAVENOUS | Status: AC
  Administered 2018-12-08: 250 mL via INTRAVENOUS

## 2018-12-08 MED ORDER — HYDRALAZINE HCL 10 MG PO TABS
10.0000 mg | ORAL_TABLET | Freq: Three times a day (TID) | ORAL | Status: DC
  Administered 2018-12-08: 10 mg via ORAL
  Filled 2018-12-08 (×3): qty 1

## 2018-12-08 MED ORDER — HYDRALAZINE HCL 10 MG PO TABS: 10.0000 mg | ORAL_TABLET | Freq: Three times a day (TID) | ORAL | 0 refills | Status: DC

## 2018-12-08 MED ORDER — ISOSORBIDE DINITRATE 5 MG PO TABS: 5.0000 mg | ORAL_TABLET | Freq: Three times a day (TID) | ORAL | 0 refills | Status: DC

## 2018-12-08 NOTE — Care Management (Signed)
Discharge to home today per Dr. Benjie Karvonen. [place] from [facility] updated.  Friend/family will transport. Shelbie Ammons RN MSN CCM Care Management [phone]

## 2018-12-08 NOTE — Progress Notes (Signed)
[facility]  ROUNDING NOTE   Subjective:   New labs are stable.   Discharge for today  Objective:  Vital signs in last 24 hours:  Temp:  [97.6 F (36.4 C)-98.2 F (36.8 C)] 97.6 F (36.4 C) (03/04 0900) Pulse Rate:  [67-70] 69 (03/04 0900) Resp:  [18] 18 (03/04 0900) BP: (123-217)/(57-92) 123/57 (03/04 0900) SpO2:  [97 %-99 %] 97 % (03/04 0444)  Weight change:  Filed Weights   12/04/18 0351 12/05/18 0407  Weight: 65.8 kg 67.2 kg    Intake/Output: I/O last 3 completed shifts: In: 720 [P.O.:720] Out: 500 [Urine:500]   Intake/Output this shift:  No intake/output data recorded.  Physical Exam: General: NAD, laying in bed  Head: Normocephalic, atraumatic. Moist oral mucosal membranes  Eyes: Anicteric, PERRL  Neck: Supple, trachea midline  Lungs:  Clear to auscultation  Heart: Regular rate and rhythm  Abdomen:  Soft, nontender,   Extremities: no peripheral edema.  Neurologic: Strength 5/5 all four extremities  Skin: No lesions        Basic Metabolic Panel: Recent Labs  Lab 12/04/18 0435 12/04/18 0840 12/05/18 0858 12/05/18 2226 12/06/18 0429 12/08/18 0938  NA 138  --  136 141 142 138  K 4.8  --  4.7 4.9 5.0 5.4*  CL 114*  --  113* 111 114* 109  CO2 18*  --  18* 21* 21* 20*  GLUCOSE 121*  --  171* 90 119* 129*  BUN 47*  --  50* 50* 50* 54*  CREATININE 2.86*  --  2.76* 2.62* 2.58* 2.59*  CALCIUM 8.9  --  8.6* 8.5* 8.4* 8.6*  MG  --  1.4*  --  1.4* 1.4*  --   PHOS  --  4.1  --  3.6 3.1 3.5    Liver Function Tests: Recent Labs  Lab 12/04/18 0435 12/05/18 2226 12/08/18 0938  AST 31 31  --   ALT 52* 47*  --   ALKPHOS 93 91  --   BILITOT 0.7 0.6  --   PROT 7.0 6.7  --   ALBUMIN 3.7 3.5 3.3*   No results for input(s): LIPASE, AMYLASE in the last 168 hours. Recent Labs  Lab 12/05/18 2226  AMMONIA 33    CBC: Recent Labs  Lab 12/04/18 0435 12/05/18 0453 12/05/18 2226 12/06/18 0429  WBC 4.3 3.9* 4.5 5.2  NEUTROABS 2.6  --    --   --   HGB 11.6* 10.5* 10.8* 10.2*  HCT 35.4* 32.1* 32.3* 30.2*  MCV 75.6* 76.4* 73.9* 74.4*  PLT 118* 108* 108* 103*    Cardiac Enzymes: Recent Labs  Lab 12/04/18 0435 12/04/18 0840 12/04/18 1109 12/05/18 2226  TROPONINI 0.51* 0.49* 0.52* 0.40*    BNP: Invalid input(s): POCBNP  CBG: Recent Labs  Lab 12/07/18 1135 12/07/18 1646 12/07/18 2129 12/08/18 0728 12/08/18 1212  GLUCAP 114* 140* 159* 143* 112*    Microbiology: Results for orders placed or performed during the hospital encounter of 12/04/18  Gastrointestinal Panel by PCR , Stool     Status: None   Collection Time: 12/05/18  2:45 PM  Result Value Ref Range Status   Campylobacter species NOT DETECTED NOT DETECTED Final   Plesimonas shigelloides NOT DETECTED NOT DETECTED Final   Salmonella species NOT DETECTED NOT DETECTED Final   Yersinia enterocolitica NOT DETECTED NOT DETECTED Final   Vibrio species NOT DETECTED NOT DETECTED Final   Vibrio cholerae NOT DETECTED NOT DETECTED Final   Enteroaggregative E coli (EAEC) NOT  DETECTED NOT DETECTED Final   Enteropathogenic E coli (EPEC) NOT DETECTED NOT DETECTED Final   Enterotoxigenic E coli (ETEC) NOT DETECTED NOT DETECTED Final   Shiga like toxin producing E coli (STEC) NOT DETECTED NOT DETECTED Final   Shigella/Enteroinvasive E coli (EIEC) NOT DETECTED NOT DETECTED Final   Cryptosporidium NOT DETECTED NOT DETECTED Final   Cyclospora cayetanensis NOT DETECTED NOT DETECTED Final   Entamoeba histolytica NOT DETECTED NOT DETECTED Final   Giardia lamblia NOT DETECTED NOT DETECTED Final   Adenovirus F40/41 NOT DETECTED NOT DETECTED Final   Astrovirus NOT DETECTED NOT DETECTED Final   Norovirus GI/GII NOT DETECTED NOT DETECTED Final   Rotavirus A NOT DETECTED NOT DETECTED Final   Sapovirus (I, II, IV, and V) NOT DETECTED NOT DETECTED Final    Comment: Performed at [facility], [address] [place], [place] [address]  MRSA PCR Screening     Status:  None   Collection Time: 12/05/18  7:49 PM  Result Value Ref Range Status   MRSA by PCR NEGATIVE NEGATIVE Final    Comment:        The GeneXpert MRSA Assay (FDA approved for NASAL specimens only), is one component of a comprehensive MRSA colonization surveillance program. It is not intended to diagnose MRSA infection nor to guide or monitor treatment for MRSA infections. Performed at [facility], [address] [place], [place] [address]     Coagulation Studies: No results for input(s): LABPROT, INR in the last 72 hours.  Urinalysis: No results for input(s): COLORURINE, LABSPEC, PHURINE, GLUCOSEU, HGBUR, BILIRUBINUR, KETONESUR, PROTEINUR, UROBILINOGEN, NITRITE, LEUKOCYTESUR in the last 72 hours.  Invalid input(s): APPERANCEUR    Imaging: No results found.   Medications:   . sodium chloride     . aspirin  81 mg Oral Daily  . atorvastatin  40 mg Oral QHS  . cholecalciferol  1,000 Units Oral Daily  . citalopram  20 mg Oral Daily  . clopidogrel  75 mg Oral Daily  . heparin injection (subcutaneous)  5,000 Units Subcutaneous Q8H  . insulin aspart  0-15 Units Subcutaneous TID WC  . insulin aspart  0-5 Units Subcutaneous QHS  . lactobacillus  1 g Oral TID WC  . levothyroxine  100 mcg Oral QAC breakfast  . lipase/protease/amylase  72,000 Units Oral TID WC  . sodium chloride flush  3 mL Intravenous Q12H   sodium chloride, acetaminophen, ALPRAZolam, hydrOXYzine, loperamide, nitroGLYCERIN, ondansetron (ZOFRAN) IV, polyvinyl alcohol, sodium chloride flush  Assessment/ Plan:  [name] is a 68 y.o. black male with Left eye blindness, diabetes mellitus type 2, hypertension, syncope, and chronic kidney disease, who was admitted to [facility] on 12/04/2018   1. Chronic kidney disease stage IV with hyperkalemia. Follows with [facility].  History of acute renal failure requiring hemodialylsis for several months. Has been off dialysis since 10/18/18. CVC has  been removed.  Chronic kidney disease secondary to diabetic nephropathy and hypertension Creatinine at baseline.   2. Metabolic acidosis - PO bicarb  3. Hypotension: allow to ride high due to presumed ischemic event.  Echocardiogram with EF of 81-27% with diastolic dysfunction.  -holding  home regimen of fursoemide, amlodipine  4. Diabetes mellitus type II with chronic kidney disease: insulin dependent. History of poor control.   Follow up with [facility]ph[facility]   LOS: 4 Carlynn Leduc 3/4/20201:50 PM

## 2018-12-08 NOTE — Progress Notes (Signed)
OT Cancellation Note  Patient Details Name: Cameron Adams MRN: [id number] DOB: [birth year]   Cancelled Treatment:    Reason Eval/Treat Not Completed: Patient at procedure or test/ unavailable. Order received. Chart reviewed. Upon arrival to pt room, pt with MDs at bedside. Will re-attempt at a later time as available and pt medically appropriate.   Shara Blazing, M.S., OTR/L Ascom: [phone] 12/08/18, 10:13 AM

## 2018-12-08 NOTE — Progress Notes (Signed)
Progress Note  Patient Name: Cameron Adams Date of Encounter: 12/08/2018  Primary Cardiologist: Fletcher Anon  Subjective   Patient's discharge was canceled on 3/3 secondary to symptomatic orthostatic hypotension while ambulating with PT with significant reported drop in blood pressure from greater than 622 systolic while laying to 70 systolic standing.  Patient was started on midodrine by internal medicine.  Without chest pain or shortness of breath.  Currently asymptomatic.  Inpatient Medications    Scheduled Meds: . aspirin  81 mg Oral Daily  . atorvastatin  40 mg Oral QHS  . cholecalciferol  1,000 Units Oral Daily  . citalopram  20 mg Oral Daily  . clopidogrel  75 mg Oral Daily  . furosemide  80 mg Oral Daily  . heparin injection (subcutaneous)  5,000 Units Subcutaneous Q8H  . hydrALAZINE  10 mg Oral Q8H  . insulin aspart  0-15 Units Subcutaneous TID WC  . insulin aspart  0-5 Units Subcutaneous QHS  . isosorbide dinitrate  5 mg Oral TID  . lactobacillus  1 g Oral TID WC  . levothyroxine  100 mcg Oral QAC breakfast  . lipase/protease/amylase  72,000 Units Oral TID WC  . sodium chloride flush  3 mL Intravenous Q12H   Continuous Infusions: . sodium chloride     PRN Meds: sodium chloride, acetaminophen, ALPRAZolam, hydrOXYzine, loperamide, nitroGLYCERIN, ondansetron (ZOFRAN) IV, polyvinyl alcohol, sodium chloride flush   Vital Signs    Vitals:   12/07/18 2000 12/07/18 2004 12/08/18 0444 12/08/18 0900  BP: (!) 217/81 (!) 215/92 (!) 148/73 (!) 123/57  Pulse: 67  67 69  Resp: 18  18 18   Temp: 97.8 F (36.6 C)  98.2 F (36.8 C) 97.6 F (36.4 C)  TempSrc: Oral  Oral Oral  SpO2: 97%  97%   Weight:      Height:        Intake/Output Summary (Last 24 hours) at 12/08/2018 1008 Last data filed at 12/08/2018 0100 Gross per 24 hour  Intake 720 ml  Output 500 ml  Net 220 ml   Filed Weights   12/04/18 0351 12/05/18 0407  Weight: 65.8 kg 67.2 kg    Telemetry    NSR-  Personally Reviewed  ECG    n/a - Personally Reviewed  Physical Exam   GEN: No acute distress.   Neck: No JVD. Cardiac: RRR, no murmurs, rubs, or gallops.  Respiratory: Clear to auscultation bilaterally.  GI: Soft, nontender, non-distended.   MS: No edema; No deformity. Neuro:  Alert and oriented x 3; Nonfocal.  Psych: Normal affect.  Labs    Chemistry Recent Labs  Lab 12/04/18 0435 12/05/18 0858 12/05/18 2226 12/06/18 0429  NA 138 136 141 142  K 4.8 4.7 4.9 5.0  CL 114* 113* 111 114*  CO2 18* 18* 21* 21*  GLUCOSE 121* 171* 90 119*  BUN 47* 50* 50* 50*  CREATININE 2.86* 2.76* 2.62* 2.58*  CALCIUM 8.9 8.6* 8.5* 8.4*  PROT 7.0  --  6.7  --   ALBUMIN 3.7  --  3.5  --   AST 31  --  31  --   ALT 52*  --  47*  --   ALKPHOS 93  --  91  --   BILITOT 0.7  --  0.6  --   GFRNONAA 22* 23* 24* 24*  GFRAA 25* 26* 28* 28*  ANIONGAP 6 5 9 7      Hematology Recent Labs  Lab 12/05/18 2979 12/05/18 2226 12/06/18 0429  WBC 3.9* 4.5 5.2  RBC 4.20* 4.37 4.06*  HGB 10.5* 10.8* 10.2*  HCT 32.1* 32.3* 30.2*  MCV 76.4* 73.9* 74.4*  MCH 25.0* 24.7* 25.1*  MCHC 32.7 33.4 33.8  RDW 17.2* 17.2* 17.1*  PLT 108* 108* 103*    Cardiac Enzymes Recent Labs  Lab 12/04/18 0435 12/04/18 0840 12/04/18 1109 12/05/18 2226  TROPONINI 0.51* 0.49* 0.52* 0.40*   No results for input(s): TROPIPOC in the last 168 hours.   BNPNo results for input(s): BNP, PROBNP in the last 168 hours.   DDimer No results for input(s): DDIMER in the last 168 hours.   Radiology    [name] Contrast  Result Date: 12/05/2018 IMPRESSION: 1. No acute intracranial abnormality. 2. Generalized age-related cerebral atrophy with moderate chronic small vessel ischemic disease with multiple small remote lacunar infarcts involving the bilateral thalami and pons. 3. Encephalomalacia at the anterior left temporal pole, which could reflect sequelae of chronic infarct or posttraumatic injury. Electronically Signed    By: Jeannine Boga M.D.   On: 12/05/2018 21:46   Ct Head Code Stroke Wo Contrast`  Result Date: 12/05/2018 IMPRESSION: 1. No acute intracranial abnormality identified. 2. Stable chronic microvascular ischemic changes and volume loss of the brain. 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 Stable encephalomalacia and left anterior temporal lobe. 3. ASPECTS is 10 These results were called by telephone at the time of interpretation on 12/05/2018 at 6:48 pm to NP [facility] , who verbally acknowledged these results. Electronically Signed   By: Kristine Garbe M.D.   On: 12/05/2018 18:52   Cardiac Studies   Echocardiogram 12/05/2018: 1. The left ventricle has moderate-severely reduced systolic function, with an ejection fraction of 30-35% of 30%%. The cavity size was mildly dilated. There is moderately increased left ventricular wall thickness. Left ventricular diastolic Doppler  parameters are consistent with impaired relaxation Left ventricular diffuse hypokinesis. 2. The right ventricle has normal systolic function. The cavity was normal. There is no increase in right ventricular wall thickness. Normal RVSP 3. There is dilatation of the aortic root, 3.5 cm.  Patient Profile     68 y.o. male with history of hypertension, chronic kidney disease and orthostatic hypotension who presented with acute left-sided weakness and was found to have mild elevated troponin.  Assessment & Plan    1. Mildly elevated troponin: -No evidence of ACS -No plans for inpatient ischemic evaluation -ASA -Lipitor -Consider outpatient Myoview as below  2. Acute systolic CHF: -He appears well compensated and euvolemic  -Amlodipine was stopped on 3/3 with initiation of hydralazine/Isordil -Start hydralazine and Isordil  -In follow up, consider addition of Coreg -Not on ACEi/ARB given CKD -Consider Myoview as an outpatient to further evaluate his cardiomyopathy -Would reserve cath for high  risk ischemia given his CKD  3. Acute on CKD: -Stable -Uncertain etiology -Per IM  4. Orthostatic hypotension: -Patient was noted to be orthostatic during PT on 3/3.  It was also noted the patient had already received amlodipine prior to this medication being discontinued.  He was also given newly ordered Isordil and hydralazine given his cardiomyopathy.  This may have precipitated some of the orthostasis noted at that time -Continue to allow amlodipine washout -Discontinue midodrine given the patient's underlying systolic CHF -If symptoms persist, we may need to de-escalate his Lasix -Recheck orthostatic vital signs this afternoon -Use low dose medications for HF at this time -Slow positional changes   For questions or updates, please contact [facility] Please consult [email or web address]  for contact info under Cardiology/STEMI.    Signed, Christell Faith, PA-C [place] Pager: [phone] 12/08/2018, 10:08 AM

## 2018-12-08 NOTE — Discharge Summary (Signed)
[facility] NAME: Cameron Adams    MR#:  [id number]  DATE OF BIRTH:  04/28/1951  DATE OF ADMISSION:  12/04/2018 ADMITTING PHYSICIAN: Avel Peace Salary, MD  DATE OF DISCHARGE: 12/08/2018  PRIMARY CARE PHYSICIAN: [name], MD    ADMISSION DIAGNOSIS:  Weakness [R53.1] CVA (cerebral vascular accident) ([place]) [I63.9] Renal insufficiency [N28.9] Elevated troponin [R79.89] AKI (acute kidney injury) ([place]) [N17.9] Cerebrovascular accident (CVA), unspecified mechanism ([place]) [I63.9]  DISCHARGE DIAGNOSIS:  Active Problems:   AKI (acute kidney injury) ([place])   Weakness   SECONDARY DIAGNOSIS:   Past Medical History:  Diagnosis Date  . Anemia of chronic disease   . Blindness of left eye   . Chronic kidney disease, stage III (moderate) (HCC)   . Diabetes ([place])   . Diarrhea   . Diastolic dysfunction    a. 06/2017 Echo: EF 55-60%, no rwma, Gr2 DD, Asc Ao 3.9cm, nl RV fxn, nl PASP.  Marland Kitchen Hypertension   . Hypothyroidism   . Monoclonal gammopathy of unknown significance (MGUS)    a. prev seen @ [facility].  Lost to f/u - never had BM bx.  . Orthostatic hypotension   . Syncope and collapse     HOSPITAL COURSE:   68 year old male with history of chronic kidney disease stage IV, orthostatic hypotension who presented to the hospital with acute left-sided weakness.  1.  TIA with acute left-sided weakness and facial droop: Patient is evaluated neurology as well as physical therapy, Occupational Therapy. MRI was negative for acute stroke. Continue Plavix and atorvastatin. 2.  Orthostatic hypotension: Patient is currently asymptomatic from this.  Patient was evaluated by cardiology.  Recommendations are to start hydralazine and continue isosorbide dinitrate. No beta-blocker in light of orthostatic hypotension.  3.  Acute systolic heart failure ejection fraction 35% with chronic diastolic heart failure: Patient was evaluated by cardiology while  in the hospital.  He will have outpatient cardiology follow-up to consider Myoview to further evaluate cardiomyopathy.  Patient will continue on hydralazine and Isordil.  Norvasc was stopped and hydralazine was used in its place. No beta-blocker at this time due to orthostatic hypotension. No ACE inhibitor/ARB given chronic kidney disease. Patient currently euvolemic and would benefit from outpatient CHF clinic  4.  Acute on chronic kidney disease stage IV: Creatinine is at baseline.  Patient will follow-up with [facility] nephrology as an outpatient.  5.  Diabetes: Continue ADA diet with outpatient regimen 6.  Hypothyroidism: Synthroid dose was decreased due to low TSH. Thyroid function test should be checked in 4 weeks.   DISCHARGE CONDITIONS AND DIET:   Stable for discharge on diabetic heart healthy diet  CONSULTS OBTAINED:  Treatment Team:  Catarina Hartshorn, MD Alexis Goodell, MD Deboraha Sprang, MD Lavonia Dana, MD  DRUG ALLERGIES:   Allergies  Allergen Reactions  . Gabapentin     Other reaction(s): Other (See Comments) Trouble sleeping     DISCHARGE MEDICATIONS:   Allergies as of 12/08/2018      Reactions   Gabapentin    Other reaction(s): Other (See Comments) Trouble sleeping       Medication List    STOP taking these medications   amLODipine 5 MG tablet Commonly known as:  NORVASC     TAKE these medications   aspirin EC 81 MG tablet Take 81 mg by mouth daily.   atorvastatin 40 MG tablet Commonly known as:  LIPITOR Take 40 mg by mouth at bedtime.   citalopram  20 MG tablet Commonly known as:  CELEXA Take 20 mg by mouth daily.   clopidogrel 75 MG tablet Commonly known as:  PLAVIX Take 1 tablet (75 mg total) by mouth daily.   furosemide 80 MG tablet Commonly known as:  LASIX Take 80 mg by mouth.   glipiZIDE 5 MG tablet Commonly known as:  GLUCOTROL Take 2.5 mg by mouth daily before breakfast.   hydrALAZINE 10 MG tablet Commonly known as:   APRESOLINE Take 1 tablet (10 mg total) by mouth every 8 (eight) hours.   hydroxypropyl methylcellulose / hypromellose 2.5 % ophthalmic solution Commonly known as:  ISOPTO TEARS / GONIOVISC Place 1 drop into both eyes 3 (three) times daily as needed for dry eyes.   hydrOXYzine 10 MG tablet Commonly known as:  ATARAX/VISTARIL Take 1 tablet (10 mg total) by mouth every 8 (eight) hours as needed for anxiety. What changed:  reasons to take this   isosorbide dinitrate 5 MG tablet Commonly known as:  ISORDIL Take 1 tablet (5 mg total) by mouth every 8 (eight) hours.   lactobacillus Pack Take 1 packet (1 g total) by mouth 3 (three) times daily with meals.   levothyroxine 100 MCG tablet Commonly known as:  SYNTHROID, LEVOTHROID Take 1 tablet (100 mcg total) by mouth daily before breakfast. What changed:    medication strength  how much to take   lipase/protease/amylase 36000 UNITS Cpep capsule Commonly known as:  CREON Take 2 capsules (72,000 Units total) by mouth 3 (three) times daily with meals.   loperamide 2 MG capsule Commonly known as:  IMODIUM Take 1 capsule (2 mg total) by mouth every 6 (six) hours as needed for diarrhea or loose stools.   VITAMIN D-1000 MAX ST 25 MCG (1000 UT) tablet Generic drug:  Cholecalciferol Take 2,000 Units by mouth daily.         Today   CHIEF COMPLAINT:  No acute issues overnight   VITAL SIGNS:  Blood pressure (!) 123/57, pulse 69, temperature 97.6 F (36.4 C), temperature source Oral, resp. rate 18, height 5\' 10"  (1.778 m), weight 67.2 kg, SpO2 97 %.   REVIEW OF SYSTEMS:  Review of Systems  Constitutional: Negative.  Negative for chills, fever and malaise/fatigue.  HENT: Negative.  Negative for ear discharge, ear pain, hearing loss, nosebleeds and sore throat.   Eyes: Negative.  Negative for blurred vision and pain.  Respiratory: Negative.  Negative for cough, hemoptysis, shortness of breath and wheezing.   Cardiovascular:  Negative.  Negative for chest pain, palpitations and leg swelling.  Gastrointestinal: Negative.  Negative for abdominal pain, blood in stool, diarrhea, nausea and vomiting.  Genitourinary: Negative.  Negative for dysuria.  Musculoskeletal: Negative.  Negative for back pain.  Skin: Negative.   Neurological: Negative for dizziness, tremors, speech change, focal weakness, seizures and headaches.  Endo/Heme/Allergies: Negative.  Does not bruise/bleed easily.  Psychiatric/Behavioral: Negative.  Negative for depression, hallucinations and suicidal ideas.     PHYSICAL EXAMINATION:  GENERAL:  68 y.o.-year-old patient lying in the bed with no acute distress.  NECK:  Supple, no jugular venous distention. No thyroid enlargement, no tenderness.  LUNGS: Normal breath sounds bilaterally, no wheezing, rales,rhonchi  No use of accessory muscles of respiration.  CARDIOVASCULAR: S1, S2 normal. No murmurs, rubs, or gallops.  ABDOMEN: Soft, non-tender, non-distended. Bowel sounds present. No organomegaly or mass.  EXTREMITIES: No pedal edema, cyanosis, or clubbing.  PSYCHIATRIC: The patient is alert and oriented x 3.  SKIN: No obvious rash, lesion,  or ulcer.   DATA REVIEW:   CBC Recent Labs  Lab 12/06/18 0429  WBC 5.2  HGB 10.2*  HCT 30.2*  PLT 103*    Chemistries  Recent Labs  Lab 12/05/18 2226 12/06/18 0429 12/08/18 0938  NA 141 142 138  K 4.9 5.0 5.4*  CL 111 114* 109  CO2 21* 21* 20*  GLUCOSE 90 119* 129*  BUN 50* 50* 54*  CREATININE 2.62* 2.58* 2.59*  CALCIUM 8.5* 8.4* 8.6*  MG 1.4* 1.4*  --   AST 31  --   --   ALT 47*  --   --   ALKPHOS 91  --   --   BILITOT 0.6  --   --     Cardiac Enzymes Recent Labs  Lab 12/04/18 0840 12/04/18 1109 12/05/18 2226  TROPONINI 0.49* 0.52* 0.40*    Microbiology Results  @MICRORSLT48 @  RADIOLOGY:  No results found.    Allergies as of 12/08/2018      Reactions   Gabapentin    Other reaction(s): Other (See Comments) Trouble  sleeping       Medication List    STOP taking these medications   amLODipine 5 MG tablet Commonly known as:  NORVASC     TAKE these medications   aspirin EC 81 MG tablet Take 81 mg by mouth daily.   atorvastatin 40 MG tablet Commonly known as:  LIPITOR Take 40 mg by mouth at bedtime.   citalopram 20 MG tablet Commonly known as:  CELEXA Take 20 mg by mouth daily.   clopidogrel 75 MG tablet Commonly known as:  PLAVIX Take 1 tablet (75 mg total) by mouth daily.   furosemide 80 MG tablet Commonly known as:  LASIX Take 80 mg by mouth.   glipiZIDE 5 MG tablet Commonly known as:  GLUCOTROL Take 2.5 mg by mouth daily before breakfast.   hydrALAZINE 10 MG tablet Commonly known as:  APRESOLINE Take 1 tablet (10 mg total) by mouth every 8 (eight) hours.   hydroxypropyl methylcellulose / hypromellose 2.5 % ophthalmic solution Commonly known as:  ISOPTO TEARS / GONIOVISC Place 1 drop into both eyes 3 (three) times daily as needed for dry eyes.   hydrOXYzine 10 MG tablet Commonly known as:  ATARAX/VISTARIL Take 1 tablet (10 mg total) by mouth every 8 (eight) hours as needed for anxiety. What changed:  reasons to take this   isosorbide dinitrate 5 MG tablet Commonly known as:  ISORDIL Take 1 tablet (5 mg total) by mouth every 8 (eight) hours.   lactobacillus Pack Take 1 packet (1 g total) by mouth 3 (three) times daily with meals.   levothyroxine 100 MCG tablet Commonly known as:  SYNTHROID, LEVOTHROID Take 1 tablet (100 mcg total) by mouth daily before breakfast. What changed:    medication strength  how much to take   lipase/protease/amylase 36000 UNITS Cpep capsule Commonly known as:  CREON Take 2 capsules (72,000 Units total) by mouth 3 (three) times daily with meals.   loperamide 2 MG capsule Commonly known as:  IMODIUM Take 1 capsule (2 mg total) by mouth every 6 (six) hours as needed for diarrhea or loose stools.   VITAMIN D-1000 MAX ST 25 MCG (1000 UT)  tablet Generic drug:  Cholecalciferol Take 2,000 Units by mouth daily.           Management plans discussed with the patient and he is in agreement. Stable for discharge home   Patient should follow up with pcp  CODE STATUS:     Code Status Orders  (From admission, onward)         Start     Ordered   12/04/18 1354  Full code  Continuous     12/04/18 1353        Code Status History    Date Active Date Inactive Code Status Order ID Comments User Context   06/22/2017 1735 06/29/2017 2042 Full Code [id number]  Vaughan Basta, MD Inpatient   06/16/2017 1849 06/19/2017 1841 Full Code [id number]  Vaughan Basta, MD Inpatient   05/17/2017 1550 05/20/2017 1744 Full Code [id number]  Idelle Crouch, MD Inpatient   09/13/2016 1756 09/16/2016 1856 Full Code [id number]  Fritzi Mandes, MD Inpatient      TOTAL TIME TAKING CARE OF THIS PATIENT: 38 minutes.    Note: This dictation was prepared with Dragon dictation along with smaller phrase technology. Any transcriptional errors that result from this process are unintentional.  Reianna Batdorf M.D on 12/08/2018 at 12:05 PM  Between 7am to 6pm - Pager - [phone] After 6pm go to [email or web address] - password [email or web address] [facility]  Office  [phone]  CC: Primary care physician; [name], MD

## 2018-12-08 NOTE — Progress Notes (Signed)
OT Cancellation Note  Patient Details Name: Cameron Adams MRN: [id number] DOB: 09-26-1951   OT Screen:    Reason Eval/Treat Not Completed: OT screened, no needs identified, will sign off. Thank you for the OT consult. Order received; chart reviewed. Upon arrival to pt room pt asleep in bed; easily awoken. This therapist described role of OT. Pt endorsed he is feeling back to baseline level of independence. Pt stated "I'm pretty self-sufficient" and indicated he does not have concerns about completing ADLs upon DC. Will sign off. Please re-consult if additional needs arise.   Shara Blazing, M.S., OTR/L Ascom: [phone] 12/08/18, 11:42 AM

## 2018-12-08 NOTE — Progress Notes (Signed)
Dr end notified  After  Orthostatics obtained. pts b/p high  Lying  But dropped sig.  80/50s  When sitting and  Pt unable to stand  D/t severe dizzines. Dr end notified of this and he will enter orders.

## 2018-12-13 DIAGNOSIS — Z8673 Personal history of transient ischemic attack (TIA), and cerebral infarction without residual deficits: Secondary | ICD-10-CM | POA: Insufficient documentation

## 2018-12-13 DIAGNOSIS — I5022 Chronic systolic (congestive) heart failure: Secondary | ICD-10-CM | POA: Insufficient documentation

## 2018-12-28 DIAGNOSIS — K8689 Other specified diseases of pancreas: Secondary | ICD-10-CM | POA: Insufficient documentation

## 2018-12-29 ENCOUNTER — Emergency Department
Admission: EM | Admit: 2018-12-29 | Discharge: 2018-12-29 | Disposition: A | Discharge status: Home / Self Care | Payer: Medicare Other | Attending: Emergency Medicine | Admitting: Emergency Medicine

## 2018-12-29 ENCOUNTER — Other Ambulatory Visit: Payer: Self-pay

## 2018-12-29 ENCOUNTER — Emergency Department: Payer: Medicare Other

## 2018-12-29 ENCOUNTER — Encounter: Payer: Self-pay | Admitting: Emergency Medicine

## 2018-12-29 DIAGNOSIS — Z87891 Personal history of nicotine dependence: Secondary | ICD-10-CM | POA: Insufficient documentation

## 2018-12-29 DIAGNOSIS — I129 Hypertensive chronic kidney disease with stage 1 through stage 4 chronic kidney disease, or unspecified chronic kidney disease: Secondary | ICD-10-CM | POA: Insufficient documentation

## 2018-12-29 DIAGNOSIS — Z79899 Other long term (current) drug therapy: Secondary | ICD-10-CM | POA: Insufficient documentation

## 2018-12-29 DIAGNOSIS — N183 Chronic kidney disease, stage 3 (moderate): Secondary | ICD-10-CM | POA: Diagnosis not present

## 2018-12-29 DIAGNOSIS — E119 Type 2 diabetes mellitus without complications: Secondary | ICD-10-CM | POA: Diagnosis not present

## 2018-12-29 DIAGNOSIS — R55 Syncope and collapse: Secondary | ICD-10-CM | POA: Diagnosis present

## 2018-12-29 LAB — TROPONIN I
Troponin I: 0.16 ng/mL (ref <0.03)
Troponin I: 0.15 ng/mL (ref <0.03)

## 2018-12-29 LAB — URINALYSIS, COMPLETE (UACMP) WITH MICROSCOPIC
BACTERIA UA: NONE SEEN
Bilirubin Urine: NEGATIVE
Glucose, UA: 150 mg/dL — AB
Ketones, ur: NEGATIVE mg/dL
Leukocytes,Ua: NEGATIVE
Nitrite: NEGATIVE
Protein, ur: 100 mg/dL — AB
Specific Gravity, Urine: 1.01 (ref 1.005–1.030)
Squamous Epithelial / HPF: NONE SEEN (ref 0–5)
pH: 5 (ref 5.0–8.0)

## 2018-12-29 LAB — CBC WITH DIFFERENTIAL/PLATELET
ABS IMMATURE GRANULOCYTES: 0.01 10*3/uL (ref 0.00–0.07)
BASOS PCT: 0 %
Basophils Absolute: 0 10*3/uL (ref 0.0–0.1)
Eosinophils Absolute: 0.8 10*3/uL — ABNORMAL HIGH (ref 0.0–0.5)
Eosinophils Relative: 16 %
HCT: 29.8 % — ABNORMAL LOW (ref 39.0–52.0)
Hemoglobin: 9.8 g/dL — ABNORMAL LOW (ref 13.0–17.0)
Immature Granulocytes: 0 %
Lymphocytes Relative: 24 %
Lymphs Abs: 1.2 10*3/uL (ref 0.7–4.0)
MCH: 25.5 pg — AB (ref 26.0–34.0)
MCHC: 32.9 g/dL (ref 30.0–36.0)
MCV: 77.4 fL — ABNORMAL LOW (ref 80.0–100.0)
Monocytes Absolute: 0.5 10*3/uL (ref 0.1–1.0)
Monocytes Relative: 10 %
NEUTROS ABS: 2.4 10*3/uL (ref 1.7–7.7)
Neutrophils Relative %: 50 %
Platelets: 136 10*3/uL — ABNORMAL LOW (ref 150–400)
RBC: 3.85 MIL/uL — ABNORMAL LOW (ref 4.22–5.81)
RDW: 18.3 % — ABNORMAL HIGH (ref 11.5–15.5)
WBC: 4.9 10*3/uL (ref 4.0–10.5)
nRBC: 0 % (ref 0.0–0.2)

## 2018-12-29 LAB — COMPREHENSIVE METABOLIC PANEL
ALT: 32 U/L (ref 0–44)
AST: 23 U/L (ref 15–41)
Albumin: 3.2 g/dL — ABNORMAL LOW (ref 3.5–5.0)
Alkaline Phosphatase: 86 U/L (ref 38–126)
Anion gap: 5 (ref 5–15)
BUN: 39 mg/dL — ABNORMAL HIGH (ref 8–23)
CALCIUM: 8.2 mg/dL — AB (ref 8.9–10.3)
CO2: 18 mmol/L — ABNORMAL LOW (ref 22–32)
Chloride: 115 mmol/L — ABNORMAL HIGH (ref 98–111)
Creatinine, Ser: 2.97 mg/dL — ABNORMAL HIGH (ref 0.61–1.24)
GFR calc Af Amer: 24 mL/min — ABNORMAL LOW (ref >60)
GFR calc non Af Amer: 21 mL/min — ABNORMAL LOW (ref >60)
Glucose, Bld: 126 mg/dL — ABNORMAL HIGH (ref 70–99)
Potassium: 4.6 mmol/L (ref 3.5–5.1)
Sodium: 138 mmol/L (ref 135–145)
Total Bilirubin: 0.5 mg/dL (ref 0.3–1.2)
Total Protein: 6.5 g/dL (ref 6.5–8.1)

## 2018-12-29 LAB — GLUCOSE, CAPILLARY: Glucose-Capillary: 116 mg/dL — ABNORMAL HIGH (ref 70–99)

## 2018-12-29 MED ORDER — SODIUM CHLORIDE 0.9 % IV BOLUS
500.0000 mL | Freq: Once | INTRAVENOUS | Status: AC
  Administered 2018-12-29: 500 mL via INTRAVENOUS

## 2018-12-29 NOTE — ED Provider Notes (Signed)
[facility] Emergency Department Provider Note       Time seen: ----------------------------------------- 11:22 AM on 12/29/2018 -----------------------------------------   I have reviewed the triage vital signs and the nursing notes.  HISTORY   Chief Complaint Loss of Consciousness    HPI Cameron Adams is a 68 y.o. male with a history of anemia of chronic disease, chronic kidney disease, diabetes, diarrhea, hypertension, hypothyroidism, syncope who presents to the ED for syncope.  Patient comes from [facility] where he was having a stress test.  He felt like he needed to have a bowel movement, head neck and arm pain then became unresponsive.  Staff states he was hypotensive.  Patient complains of diffuse weakness on arrival.  Past Medical History:  Diagnosis Date  . Anemia of chronic disease   . Blindness of left eye   . Chronic kidney disease, stage III (moderate) (HCC)   . Diabetes ([place])   . Diarrhea   . Diastolic dysfunction    a. 06/2017 Echo: EF 55-60%, no rwma, Gr2 DD, Asc Ao 3.9cm, nl RV fxn, nl PASP.  Marland Kitchen Hypertension   . Hypothyroidism   . Monoclonal gammopathy of unknown significance (MGUS)    a. prev seen @ [facility].  Lost to f/u - never had BM bx.  . Orthostatic hypotension   . Syncope and collapse     Patient Active Problem List   Diagnosis Date Noted  . Weakness   . AKI (acute kidney injury) ([place]) 12/04/2018  . Dizziness 06/22/2017  . Hyperkalemia 06/16/2017  . Esophageal dysmotilities   . Dysphagia   . Rib fractures 05/17/2017  . Hemothorax, right 05/17/2017  . Recurrent major depressive disorder, in partial remission ([place]) 05/13/2017  . Pure hypercholesterolemia 05/13/2017  . IDDM (insulin dependent diabetes mellitus) ([place]) 05/13/2017  . Hypothyroidism (acquired) 05/13/2017  . GAD (generalized anxiety disorder) 05/13/2017  . CKD (chronic kidney disease) stage 3, GFR 30-59 ml/min (HCC) 05/13/2017  . Toe gangrene ([place])  09/16/2016  . PVD (peripheral vascular disease) ([place]) 09/16/2016  . Uncontrolled diabetes mellitus ([place]) 09/16/2016  . Hyponatremia 09/16/2016  . Hypokalemia 09/16/2016  . Essential hypertension, malignant 09/16/2016  . Abnormal angiogram 09/16/2016  . Gangrene ([place]) 09/13/2016  . Pruritus 03/26/2016  . Orthostatic hypotension 03/26/2016  . 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 Alcohol abuse 03/26/2016  . Hyperglycemia 03/23/2016  . Passive suicidal ideations 09/16/2014  . Dyspnea 11/09/2013  . CAP (community acquired pneumonia) 10/31/2013  . Chest pain 10/31/2013  . Chronic respiratory failure with hypoxia ([place]) 10/31/2013  . 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 Diabetic retinopathy ([place]) 06/16/2013  . Gynecomastia 04/18/2013    Past Surgical History:  Procedure Laterality Date  . 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 PERIPHERAL VASCULAR CATHETERIZATION N/A 09/15/2016   Procedure: Lower Extremity Angiography;  Surgeon: Algernon Huxley, MD;  Location: [place] CV LAB;  Service: Cardiovascular;  Laterality: N/A;  . THYROID SURGERY      Allergies Gabapentin  Social History Social History   Tobacco Use  . Smoking status: Former Smoker    Packs/day: 0.10    Years: 25.00    Pack years: 2.50    Types: Cigarettes    Last attempt to quit: 10/31/1978    Years since quitting: 40.1  . Smokeless tobacco: Never Used  . Tobacco comment: smoked 2-3 cigarettes/day  Substance  Use Topics  . Alcohol use: No    Comment: previously drank heavily but not in many years  . Drug use: No   Review of Systems Constitutional: Negative for fever. Cardiovascular: Negative for chest pain. Respiratory: Negative for shortness of breath. Gastrointestinal: Negative for abdominal pain, vomiting and diarrhea. Musculoskeletal: Negative for back  pain. Skin: Negative for rash. Neurological: Positive for generalized weakness  All systems negative/normal/unremarkable except as stated in the HPI  ____________________________________________   PHYSICAL EXAM:  VITAL SIGNS: ED Triage Vitals  Enc Vitals Group     BP 12/29/18 1110 (!) 155/78     Pulse Rate 12/29/18 1110 77     Resp 12/29/18 1110 16     Temp --      Temp src --      SpO2 12/29/18 1110 100 %     Weight 12/29/18 1111 148 lb 2.4 oz (67.2 kg)     Height 12/29/18 1111 5\' 10"  (1.778 m)     Head Circumference --      Peak Flow --      Pain Score 12/29/18 1111 6     Pain Loc --      Pain Edu? --      Excl. in [place]? --    Constitutional: Alert and oriented. Well appearing and in no distress. Eyes: Conjunctivae are normal. Normal extraocular movements. ENT      Head: Normocephalic and atraumatic.      Nose: No congestion/rhinnorhea.      Mouth/Throat: Mucous membranes are moist.      Neck: No stridor. Cardiovascular: Normal rate, regular rhythm. No murmurs, rubs, or gallops. Respiratory: Normal respiratory effort without tachypnea nor retractions. Breath sounds are clear and equal bilaterally. No wheezes/rales/rhonchi. Gastrointestinal: Soft and nontender. Normal bowel sounds Musculoskeletal: Nontender with normal range of motion in extremities. No lower extremity tenderness nor edema. Neurologic:  Normal speech and language. No gross focal neurologic deficits are appreciated.  Generalized weakness, nothing focal Skin:  Skin is warm, dry and intact. No rash noted. Psychiatric: Mood and affect are normal. Speech and behavior are normal.  ____________________________________________  EKG: Interpreted by me.  Sinus rhythm the rate of 72 bpm, right bundle branch block, left anterior fascicular block, long QT  ____________________________________________  ED COURSE:  As part of my medical decision making, I reviewed the following data within the electronic medical  record:  History obtained from family if available, nursing notes, old chart and ekg, as well as notes from prior ED visits. Patient presented for syncope, we will assess with labs and imaging as indicated at this time. Clinical Course as of Dec 28 1344  Wed Dec 29, 2018  1218 Discussed with Dr. Clayborn Bigness, feels comfortable with discharge after fluids   [JW]    Clinical Course User Index [JW] Earleen Newport, MD   Procedures ____________________________________________   LABS (pertinent positives/negatives)  Labs Reviewed  GLUCOSE, CAPILLARY - Abnormal; Notable for the following components:      Result Value   Glucose-Capillary 116 (*)    All other components within normal limits  CBC WITH DIFFERENTIAL/PLATELET - Abnormal; Notable for the following components:   RBC 3.85 (*)    Hemoglobin 9.8 (*)    HCT 29.8 (*)    MCV 77.4 (*)    MCH 25.5 (*)    RDW 18.3 (*)    Platelets 136 (*)    Eosinophils Absolute 0.8 (*)    All other components within normal limits  COMPREHENSIVE METABOLIC  PANEL - Abnormal; Notable for the following components:   Chloride 115 (*)    CO2 18 (*)    Glucose, Bld 126 (*)    BUN 39 (*)    Creatinine, Ser 2.97 (*)    Calcium 8.2 (*)    Albumin 3.2 (*)    GFR calc non Af Amer 21 (*)    GFR calc Af Amer 24 (*)    All other components within normal limits  TROPONIN I - Abnormal; Notable for the following components:   Troponin I 0.16 (*)    All other components within normal limits  TROPONIN I - Abnormal; Notable for the following components:   Troponin I 0.15 (*)    All other components within normal limits  URINALYSIS, COMPLETE (UACMP) WITH MICROSCOPIC  CBG MONITORING, ED    RADIOLOGY Images were viewed by me  Chest x-ray IMPRESSION: No active disease.  ____________________________________________   DIFFERENTIAL DIAGNOSIS   Dehydration, electrolyte abnormality, vasovagal event, arrhythmia, MI, orthostatic hypotension  FINAL  ASSESSMENT AND PLAN  Syncope   Plan: The patient had presented for syncope today. Patient's labs did reveal a chronically elevated troponin and some renal deficiency which appear to be acute on chronic.  He was given a normal saline bolus and repeat troponin appear to be improving. Patient's imaging did not reveal any acute process.  He has eaten well and wants to go home.  I discussed with cardiology who agrees with this plan.  He is cleared for outpatient follow-up.   Laurence Aly, MD    Note: This note was generated in part or whole with voice recognition software. Voice recognition is usually quite accurate but there are transcription errors that can and very often do occur. I apologize for any typographical errors that were not detected and corrected.     Earleen Newport, MD 12/29/18 1346

## 2018-12-29 NOTE — ED Notes (Signed)
Pt alert oriented and interactive at this time.  Pt eating a sandwich tray and states that he feels much better and is ready to go home.  Dr. Jimmye Norman in to check on pt at this time.  Will continue to monitor.

## 2018-12-29 NOTE — ED Notes (Signed)
First Nurse: Patient brought to ED via [place] from [facility], patient unresponsive, called for bed, patient to Rm 4 emergently.

## 2018-12-29 NOTE — ED Notes (Signed)
Date and time results received: 12/29/18 1:38 PM   Test: troponin Critical Value: 0.15  Name of Provider Notified: epic note to Dr. Jimmye Norman

## 2018-12-29 NOTE — ED Notes (Signed)
Pt becoming more alert, answering questions and interacting with staff.  Will continue to monitor.

## 2018-12-29 NOTE — ED Triage Notes (Signed)
Pt to ER via [place] from [facility] where he was having a stress test when he felt the need to have BM, c/o neck pain and arm numbness and became unresponsive.  Staff from [facility] states pt was hypotensive and has hx of DM.

## 2018-12-29 NOTE — ED Notes (Signed)
Date and time results received: 12/29/18 1:01 PM  (use smartphrase ".now" to insert current time)  Test: Troponin Critical Value: 0.16  Name of Provider Notified: Dr. Jimmye Norman  Orders Received? Or Actions Taken?: Orders Received - See Orders for details

## 2018-12-29 NOTE — ED Notes (Signed)
First Nurse Note: Patients black fleece vest given to daughter "LeeLee" with patient's black wallet and dentures in the pockets.

## 2019-01-04 ENCOUNTER — Telehealth: Payer: Self-pay

## 2019-01-04 NOTE — Telephone Encounter (Signed)
Call to patient regarding appt on 01/05/19 with Ignacia Bayley, NP.  Per chart he has previously been followed by [facility]. Calling to confirm if patient wants to be followed by our clinic.   No answer. LMTCB.

## 2019-01-05 NOTE — Telephone Encounter (Signed)
Attempted to call patient. LMTCB 01/05/2019

## 2019-01-06 ENCOUNTER — Ambulatory Visit: Payer: Medicare Other | Admitting: Nurse Practitioner

## 2019-01-07 NOTE — Telephone Encounter (Signed)
Multiple attempts to reach pt in response to cancellation d/t CV 19 precautions. No answer or call back. He has been most recently seen by [facility] prior to ED visit however, he does not have current appt set.

## 2019-01-12 ENCOUNTER — Emergency Department: Payer: Medicare Other

## 2019-01-12 ENCOUNTER — Other Ambulatory Visit: Payer: Self-pay

## 2019-01-12 ENCOUNTER — Inpatient Hospital Stay
Admission: EM | Admit: 2019-01-12 | Discharge: 2019-01-16 | DRG: 312 | Disposition: A | Discharge status: Home Health | Payer: Medicare Other | Attending: Internal Medicine | Admitting: Internal Medicine

## 2019-01-12 DIAGNOSIS — Z79890 Hormone replacement therapy (postmenopausal): Secondary | ICD-10-CM

## 2019-01-12 DIAGNOSIS — Z89421 Acquired absence of other right toe(s): Secondary | ICD-10-CM

## 2019-01-12 DIAGNOSIS — E1122 Type 2 diabetes mellitus with diabetic chronic kidney disease: Secondary | ICD-10-CM | POA: Diagnosis present

## 2019-01-12 DIAGNOSIS — H5462 Unqualified visual loss, left eye, normal vision right eye: Secondary | ICD-10-CM | POA: Diagnosis present

## 2019-01-12 DIAGNOSIS — R202 Paresthesia of skin: Secondary | ICD-10-CM

## 2019-01-12 DIAGNOSIS — Z8249 Family history of ischemic heart disease and other diseases of the circulatory system: Secondary | ICD-10-CM

## 2019-01-12 DIAGNOSIS — R29711 NIHSS score 11: Secondary | ICD-10-CM | POA: Diagnosis present

## 2019-01-12 DIAGNOSIS — R55 Syncope and collapse: Secondary | ICD-10-CM | POA: Diagnosis present

## 2019-01-12 DIAGNOSIS — E039 Hypothyroidism, unspecified: Secondary | ICD-10-CM | POA: Diagnosis present

## 2019-01-12 DIAGNOSIS — I6523 Occlusion and stenosis of bilateral carotid arteries: Secondary | ICD-10-CM | POA: Diagnosis present

## 2019-01-12 DIAGNOSIS — Z7982 Long term (current) use of aspirin: Secondary | ICD-10-CM

## 2019-01-12 DIAGNOSIS — E785 Hyperlipidemia, unspecified: Secondary | ICD-10-CM | POA: Diagnosis present

## 2019-01-12 DIAGNOSIS — Z79899 Other long term (current) drug therapy: Secondary | ICD-10-CM

## 2019-01-12 DIAGNOSIS — I951 Orthostatic hypotension: Secondary | ICD-10-CM | POA: Diagnosis not present

## 2019-01-12 DIAGNOSIS — Z841 Family history of disorders of kidney and ureter: Secondary | ICD-10-CM

## 2019-01-12 DIAGNOSIS — Z7902 Long term (current) use of antithrombotics/antiplatelets: Secondary | ICD-10-CM

## 2019-01-12 DIAGNOSIS — I4510 Unspecified right bundle-branch block: Secondary | ICD-10-CM | POA: Diagnosis present

## 2019-01-12 DIAGNOSIS — D631 Anemia in chronic kidney disease: Secondary | ICD-10-CM | POA: Diagnosis present

## 2019-01-12 DIAGNOSIS — R4781 Slurred speech: Secondary | ICD-10-CM | POA: Diagnosis not present

## 2019-01-12 DIAGNOSIS — I130 Hypertensive heart and chronic kidney disease with heart failure and stage 1 through stage 4 chronic kidney disease, or unspecified chronic kidney disease: Secondary | ICD-10-CM | POA: Diagnosis present

## 2019-01-12 DIAGNOSIS — E860 Dehydration: Secondary | ICD-10-CM | POA: Diagnosis present

## 2019-01-12 DIAGNOSIS — Z87891 Personal history of nicotine dependence: Secondary | ICD-10-CM

## 2019-01-12 DIAGNOSIS — I248 Other forms of acute ischemic heart disease: Secondary | ICD-10-CM | POA: Diagnosis present

## 2019-01-12 DIAGNOSIS — N183 Chronic kidney disease, stage 3 (moderate): Secondary | ICD-10-CM | POA: Diagnosis present

## 2019-01-12 DIAGNOSIS — I5042 Chronic combined systolic (congestive) and diastolic (congestive) heart failure: Secondary | ICD-10-CM | POA: Diagnosis present

## 2019-01-12 DIAGNOSIS — Z8673 Personal history of transient ischemic attack (TIA), and cerebral infarction without residual deficits: Secondary | ICD-10-CM

## 2019-01-12 DIAGNOSIS — R634 Abnormal weight loss: Secondary | ICD-10-CM | POA: Diagnosis present

## 2019-01-12 DIAGNOSIS — Z888 Allergy status to other drugs, medicaments and biological substances status: Secondary | ICD-10-CM

## 2019-01-12 DIAGNOSIS — R451 Restlessness and agitation: Secondary | ICD-10-CM | POA: Diagnosis present

## 2019-01-12 DIAGNOSIS — D472 Monoclonal gammopathy: Secondary | ICD-10-CM | POA: Diagnosis present

## 2019-01-12 LAB — CBC WITH DIFFERENTIAL/PLATELET
Abs Immature Granulocytes: 0 K/uL (ref 0.00–0.07)
Basophils Absolute: 0 K/uL (ref 0.0–0.1)
Basophils Relative: 1 %
Eosinophils Absolute: 0.6 K/uL — ABNORMAL HIGH (ref 0.0–0.5)
Eosinophils Relative: 14 %
HCT: 31 % — ABNORMAL LOW (ref 39.0–52.0)
Hemoglobin: 10 g/dL — ABNORMAL LOW (ref 13.0–17.0)
Immature Granulocytes: 0 %
Lymphocytes Relative: 29 %
Lymphs Abs: 1.2 K/uL (ref 0.7–4.0)
MCH: 26.2 pg (ref 26.0–34.0)
MCHC: 32.3 g/dL (ref 30.0–36.0)
MCV: 81.2 fL (ref 80.0–100.0)
Monocytes Absolute: 0.5 K/uL (ref 0.1–1.0)
Monocytes Relative: 12 %
Neutro Abs: 1.8 K/uL (ref 1.7–7.7)
Neutrophils Relative %: 44 %
Platelets: 135 K/uL — ABNORMAL LOW (ref 150–400)
RBC: 3.82 MIL/uL — ABNORMAL LOW (ref 4.22–5.81)
RDW: 19.9 % — ABNORMAL HIGH (ref 11.5–15.5)
WBC: 4.1 K/uL (ref 4.0–10.5)
nRBC: 0 % (ref 0.0–0.2)

## 2019-01-12 LAB — COMPREHENSIVE METABOLIC PANEL
ALT: 28 U/L (ref 0–44)
AST: 27 U/L (ref 15–41)
Albumin: 3.1 g/dL — ABNORMAL LOW (ref 3.5–5.0)
Alkaline Phosphatase: 91 U/L (ref 38–126)
Anion gap: 4 — ABNORMAL LOW (ref 5–15)
BUN: 31 mg/dL — ABNORMAL HIGH (ref 8–23)
CO2: 18 mmol/L — ABNORMAL LOW (ref 22–32)
Calcium: 8.2 mg/dL — ABNORMAL LOW (ref 8.9–10.3)
Chloride: 117 mmol/L — ABNORMAL HIGH (ref 98–111)
Creatinine, Ser: 2.79 mg/dL — ABNORMAL HIGH (ref 0.61–1.24)
GFR calc Af Amer: 26 mL/min — ABNORMAL LOW (ref >60)
GFR calc non Af Amer: 22 mL/min — ABNORMAL LOW (ref >60)
Glucose, Bld: 220 mg/dL — ABNORMAL HIGH (ref 70–99)
Potassium: 4.7 mmol/L (ref 3.5–5.1)
Sodium: 139 mmol/L (ref 135–145)
Total Bilirubin: 0.6 mg/dL (ref 0.3–1.2)
Total Protein: 6.5 g/dL (ref 6.5–8.1)

## 2019-01-12 LAB — TROPONIN I
Troponin I: 0.12 ng/mL
Troponin I: 0.12 ng/mL (ref <0.03)
Troponin I: 0.13 ng/mL (ref <0.03)

## 2019-01-12 LAB — GLUCOSE, CAPILLARY: Glucose-Capillary: 136 mg/dL — ABNORMAL HIGH (ref 70–99)

## 2019-01-12 MED ORDER — ONDANSETRON HCL 4 MG PO TABS: 4.0000 mg | ORAL_TABLET | Freq: Four times a day (QID) | ORAL | Status: DC | PRN

## 2019-01-12 MED ORDER — SODIUM CHLORIDE 0.9% FLUSH: 3.0000 mL | INTRAVENOUS | Status: DC | PRN

## 2019-01-12 MED ORDER — CITALOPRAM HYDROBROMIDE 20 MG PO TABS
20.0000 mg | ORAL_TABLET | Freq: Every day | ORAL | Status: DC
  Administered 2019-01-13 – 2019-01-16 (×4): 20 mg via ORAL
  Filled 2019-01-12 (×4): qty 1

## 2019-01-12 MED ORDER — SODIUM CHLORIDE 0.9 % IV SOLN: 250.0000 mL | INTRAVENOUS | Status: DC | PRN

## 2019-01-12 MED ORDER — ASPIRIN EC 81 MG PO TBEC
81.0000 mg | DELAYED_RELEASE_TABLET | Freq: Every day | ORAL | Status: DC
  Administered 2019-01-13 – 2019-01-16 (×4): 81 mg via ORAL
  Filled 2019-01-12 (×4): qty 1

## 2019-01-12 MED ORDER — GLIPIZIDE 5 MG PO TABS
2.5000 mg | ORAL_TABLET | Freq: Every day | ORAL | Status: DC
  Administered 2019-01-13: 2.5 mg via ORAL
  Filled 2019-01-12: qty 0.5

## 2019-01-12 MED ORDER — LOPERAMIDE HCL 2 MG PO CAPS: 2.0000 mg | ORAL_CAPSULE | Freq: Four times a day (QID) | ORAL | Status: DC | PRN

## 2019-01-12 MED ORDER — PANCRELIPASE (LIP-PROT-AMYL) 12000-38000 UNITS PO CPEP
72000.0000 [IU] | ORAL_CAPSULE | Freq: Three times a day (TID) | ORAL | Status: DC
  Administered 2019-01-13 – 2019-01-16 (×6): 72000 [IU] via ORAL
  Filled 2019-01-12 (×8): qty 6

## 2019-01-12 MED ORDER — VITAMIN D3 25 MCG (1000 UNIT) PO TABS
2000.0000 [IU] | ORAL_TABLET | Freq: Every day | ORAL | Status: DC
  Administered 2019-01-13 – 2019-01-16 (×4): 2000 [IU] via ORAL
  Filled 2019-01-12 (×7): qty 2

## 2019-01-12 MED ORDER — ONDANSETRON HCL 4 MG/2ML IJ SOLN: 4.0000 mg | Freq: Four times a day (QID) | INTRAMUSCULAR | Status: DC | PRN

## 2019-01-12 MED ORDER — LEVOTHYROXINE SODIUM 100 MCG PO TABS
100.0000 ug | ORAL_TABLET | Freq: Every day | ORAL | Status: DC
  Administered 2019-01-13 – 2019-01-16 (×4): 100 ug via ORAL
  Filled 2019-01-12 (×3): qty 1
  Filled 2019-01-12: qty 2

## 2019-01-12 MED ORDER — POLYVINYL ALCOHOL 1.4 % OP SOLN
1.0000 [drp] | Freq: Three times a day (TID) | OPHTHALMIC | Status: DC | PRN
  Filled 2019-01-12: qty 15

## 2019-01-12 MED ORDER — ATORVASTATIN CALCIUM 20 MG PO TABS
40.0000 mg | ORAL_TABLET | Freq: Every day | ORAL | Status: DC
  Administered 2019-01-13 – 2019-01-14 (×2): 40 mg via ORAL
  Filled 2019-01-12 (×4): qty 2

## 2019-01-12 MED ORDER — HYDRALAZINE HCL 20 MG/ML IJ SOLN
10.0000 mg | Freq: Four times a day (QID) | INTRAMUSCULAR | Status: DC | PRN
  Administered 2019-01-12 – 2019-01-15 (×4): 10 mg via INTRAVENOUS
  Filled 2019-01-12 (×5): qty 1

## 2019-01-12 MED ORDER — INSULIN ASPART 100 UNIT/ML ~~LOC~~ SOLN
0.0000 [IU] | Freq: Three times a day (TID) | SUBCUTANEOUS | Status: DC
  Administered 2019-01-12: 1 [IU] via SUBCUTANEOUS
  Administered 2019-01-13: 3 [IU] via SUBCUTANEOUS
  Administered 2019-01-15 – 2019-01-16 (×2): 2 [IU] via SUBCUTANEOUS
  Filled 2019-01-12 (×4): qty 1

## 2019-01-12 MED ORDER — CLOPIDOGREL BISULFATE 75 MG PO TABS
75.0000 mg | ORAL_TABLET | Freq: Every day | ORAL | Status: DC
  Administered 2019-01-13 – 2019-01-16 (×4): 75 mg via ORAL
  Filled 2019-01-12 (×4): qty 1

## 2019-01-12 MED ORDER — SODIUM CHLORIDE 0.9% FLUSH
3.0000 mL | Freq: Two times a day (BID) | INTRAVENOUS | Status: DC
  Administered 2019-01-12 – 2019-01-16 (×8): 3 mL via INTRAVENOUS

## 2019-01-12 MED ORDER — ACETAMINOPHEN 325 MG PO TABS
650.0000 mg | ORAL_TABLET | Freq: Four times a day (QID) | ORAL | Status: DC | PRN
  Administered 2019-01-12 – 2019-01-13 (×2): 650 mg via ORAL
  Filled 2019-01-12 (×2): qty 2

## 2019-01-12 MED ORDER — ACETAMINOPHEN 650 MG RE SUPP: 650.0000 mg | Freq: Four times a day (QID) | RECTAL | Status: DC | PRN

## 2019-01-12 MED ORDER — ENOXAPARIN SODIUM 30 MG/0.3ML ~~LOC~~ SOLN
30.0000 mg | SUBCUTANEOUS | Status: DC
  Administered 2019-01-12 – 2019-01-14 (×3): 30 mg via SUBCUTANEOUS
  Filled 2019-01-12 (×4): qty 0.3

## 2019-01-12 NOTE — H&P (Addendum)
[facility] NAME: Cameron Adams    MR#:  [id number]  DATE OF BIRTH:  08-Nov-1950  DATE OF ADMISSION:  01/12/2019  PRIMARY CARE PHYSICIAN: [name], MD   REQUESTING/REFERRING PHYSICIAN: Earleen Newport, MD  CHIEF COMPLAINT:   Chief Complaint  Patient presents with  . Loss of Consciousness    HISTORY OF PRESENT ILLNESS: Cameron Brannen  is a 68 y.o. male with a known history of multiple medical problems, including chronic kidney disease, diabetes, chronic diastolic CHF, hypertension, hypothyroidism who is presenting to the hospital with episode of syncope.  Patient was seen in the emergency room treatment 2 weeks ago with similar type of presentation.  Patient has had a echocardiogram recently which does not show any significant valvular dysfunction.  He also has had MRI of the brain and C-spine as well. Patient apparently was eating lunch took a pause and then became unresponsive for 1 minute.  Was feeling dizzy prior to this episode.  He had not any seizure type of medications   PAST MEDICAL HISTORY:   Past Medical History:  Diagnosis Date  . Anemia of chronic disease   . Blindness of left eye   . Chronic kidney disease, stage III (moderate) (HCC)   . Diabetes ([place])   . Diarrhea   . Diastolic dysfunction    a. 06/2017 Echo: EF 55-60%, no rwma, Gr2 DD, Asc Ao 3.9cm, nl RV fxn, nl PASP.  Marland Kitchen Hypertension   . Hypothyroidism   . Monoclonal gammopathy of unknown significance (MGUS)    a. prev seen @ [facility].  Lost to f/u - never had BM bx.  . Orthostatic hypotension   . Syncope and collapse     PAST SURGICAL HISTORY:  Past Surgical History:  Procedure Laterality Date  . 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 PERIPHERAL VASCULAR CATHETERIZATION N/A 09/15/2016   Procedure: Lower Extremity Angiography;   Surgeon: Algernon Huxley, MD;  Location: [facility];  Service: Cardiovascular;  Laterality: N/A;  . THYROID SURGERY      SOCIAL HISTORY:  Social History   Tobacco Use  . Smoking status: Former Smoker    Packs/day: 0.10    Years: 25.00    Pack years: 2.50    Types: Cigarettes    Last attempt to quit: 10/31/1978    Years since quitting: 40.2  . Smokeless tobacco: Never Used  . Tobacco comment: smoked 2-3 cigarettes/day  Substance Use Topics  . Alcohol use: No    Comment: previously drank heavily but not in many years    FAMILY HISTORY:  Family History  Problem Relation Age of Onset  . Kidney failure Mother   . Heart disease Father   . Kidney failure Father   . Heart disease Brother     DRUG ALLERGIES:  Allergies  Allergen Reactions  . Gabapentin     Other reaction(s): Other (See Comments) Trouble sleeping     REVIEW OF SYSTEMS:   CONSTITUTIONAL: No fever, fatigue or weakness.  EYES: No blurred or double vision.  EARS, NOSE, AND THROAT: No tinnitus or ear pain.  RESPIRATORY: No cough, shortness of breath, wheezing or hemoptysis.  CARDIOVASCULAR: No chest pain, orthopnea, edema.  Positive syncope GASTROINTESTINAL: No nausea, vomiting, diarrhea or abdominal pain.  GENITOURINARY: No dysuria, hematuria.  ENDOCRINE: No polyuria, nocturia,  HEMATOLOGY: No anemia, easy  bruising or bleeding SKIN: No rash or lesion. MUSCULOSKELETAL: No joint pain or arthritis.   NEUROLOGIC: No tingling, numbness, weakness.  PSYCHIATRY: No anxiety or depression.   MEDICATIONS AT HOME:  Prior to Admission medications   Medication Sig Start Date End Date Taking? Authorizing Provider  acidophilus (RISAQUAD) CAPS capsule Take 1 capsule by mouth daily.    [provider]  aspirin EC 81 MG tablet Take 81 mg by mouth daily.    [provider]  atorvastatin (LIPITOR) 40 MG tablet Take 40 mg by mouth at bedtime.    [provider]  Cholecalciferol (VITAMIN D-1000  MAX ST) 25 MCG (1000 UT) tablet Take 2,000 Units by mouth daily.     [provider]  citalopram (CELEXA) 20 MG tablet Take 20 mg by mouth daily.    [provider]  clopidogrel (PLAVIX) 75 MG tablet Take 1 tablet (75 mg total) by mouth daily. 12/07/18 12/07/19  Salary, Avel Peace, MD  glipiZIDE (GLUCOTROL) 5 MG tablet Take 2.5 mg by mouth daily before breakfast.  11/26/18   [provider]  hydrALAZINE (APRESOLINE) 10 MG tablet Take 1 tablet (10 mg total) by mouth every 8 (eight) hours. 12/08/18   Bettey Costa, MD  hydroxypropyl methylcellulose / hypromellose (ISOPTO TEARS / GONIOVISC) 2.5 % ophthalmic solution Place 1 drop into both eyes 3 (three) times daily as needed for dry eyes.    [provider]  hydrOXYzine (ATARAX/VISTARIL) 10 MG tablet Take 1 tablet (10 mg total) by mouth every 8 (eight) hours as needed for anxiety. Patient not taking: Reported on 12/29/2018 12/07/18   Salary, Holly Bodily D, MD  isosorbide dinitrate (ISORDIL) 5 MG tablet Take 1 tablet (5 mg total) by mouth every 8 (eight) hours. Patient not taking: Reported on 12/29/2018 12/08/18   Bettey Costa, MD  lactobacillus (FLORANEX/LACTINEX) PACK Take 1 packet (1 g total) by mouth 3 (three) times daily with meals. Patient not taking: Reported on 12/29/2018 12/07/18   Salary, Avel Peace, MD  levothyroxine (SYNTHROID, LEVOTHROID) 100 MCG tablet Take 1 tablet (100 mcg total) by mouth daily before breakfast. 12/08/18   Salary, Avel Peace, MD  lipase/protease/amylase (CREON) 36000 UNITS CPEP capsule Take 2 capsules (72,000 Units total) by mouth 3 (three) times daily with meals. 06/29/17   Vaughan Basta, MD  loperamide (IMODIUM) 2 MG capsule Take 1 capsule (2 mg total) by mouth every 6 (six) hours as needed for diarrhea or loose stools. 06/29/17   Vaughan Basta, MD      PHYSICAL EXAMINATION:   VITAL SIGNS: Blood pressure (!) 148/71, pulse (!) 58, temperature 97.8 F (36.6 C), temperature source Oral, resp.  rate 19, height 5\' 11"  (1.803 m), weight 70.3 kg, SpO2 100 %.  GENERAL:  68 y.o.-year-old patient lying in the bed with no acute distress.  EYES: Pupils equal, round, reactive to light and accommodation. No scleral icterus. Extraocular muscles intact.  HEENT: Head atraumatic, normocephalic. Oropharynx and nasopharynx clear.  NECK:  Supple, no jugular venous distention. No thyroid enlargement, no tenderness.  LUNGS: Normal breath sounds bilaterally, no wheezing, rales,rhonchi or crepitation. No use of accessory muscles of respiration.  CARDIOVASCULAR: S1, S2 normal. No murmurs, rubs, or gallops.  ABDOMEN: Soft, nontender, nondistended. Bowel sounds present. No organomegaly or mass.  EXTREMITIES: No pedal edema, cyanosis, or clubbing.  NEUROLOGIC: Cranial nerves II through XII are intact. Muscle strength 5/5 in all extremities. Sensation intact. Gait not checked.  PSYCHIATRIC: The patient is alert and oriented x 3.  SKIN: No  obvious rash, lesion, or ulcer.   LABORATORY PANEL:   CBC Recent Labs  Lab 01/12/19 1439  WBC 4.1  HGB 10.0*  HCT 31.0*  PLT 135*  MCV 81.2  MCH 26.2  MCHC 32.3  RDW 19.9*  LYMPHSABS 1.2  MONOABS 0.5  EOSABS 0.6*  BASOSABS 0.0   ------------------------------------------------------------------------------------------------------------------  Chemistries  Recent Labs  Lab 01/12/19 1439  NA 139  K 4.7  CL 117*  CO2 18*  GLUCOSE 220*  BUN 31*  CREATININE 2.79*  CALCIUM 8.2*  AST 27  ALT 28  ALKPHOS 91  BILITOT 0.6   ------------------------------------------------------------------------------------------------------------------ estimated creatinine clearance is 25.2 mL/min (A) (by C-G formula based on SCr of 2.79 mg/dL (H)). ------------------------------------------------------------------------------------------------------------------ No results for input(s): TSH, T4TOTAL, T3FREE, THYROIDAB in the last 72 hours.  Invalid input(s):  FREET3   Coagulation profile No results for input(s): INR, PROTIME in the last 168 hours. ------------------------------------------------------------------------------------------------------------------- No results for input(s): DDIMER in the last 72 hours. -------------------------------------------------------------------------------------------------------------------  Cardiac Enzymes Recent Labs  Lab 01/12/19 1439  TROPONINI 0.12*   ------------------------------------------------------------------------------------------------------------------ Invalid input(s): POCBNP  ---------------------------------------------------------------------------------------------------------------  Urinalysis    Component Value Date/Time   COLORURINE YELLOW (A) 12/29/2018 1337   APPEARANCEUR CLEAR (A) 12/29/2018 1337   LABSPEC 1.010 12/29/2018 1337   PHURINE 5.0 12/29/2018 1337   GLUCOSEU 150 (A) 12/29/2018 1337   HGBUR MODERATE (A) 12/29/2018 1337   BILIRUBINUR NEGATIVE 12/29/2018 [place] 12/29/2018 1337   PROTEINUR 100 (A) 12/29/2018 1337   NITRITE NEGATIVE 12/29/2018 1337   LEUKOCYTESUR NEGATIVE 12/29/2018 1337     RADIOLOGY: Dg Chest 1 View  Result Date: 01/12/2019 CLINICAL DATA:  Weakness and syncope EXAM: CHEST  1 VIEW COMPARISON:  December 29, 2018 FINDINGS: There is no appreciable edema or consolidation. Heart size and pulmonary vascularity are within normal limits. No adenopathy. A small metallic foreign body is noted slightly to the left of midline at the T2 level, stable. There is degenerative change in the thoracic spine. IMPRESSION: No edema or consolidation.  Stable cardiac silhouette. Electronically Signed   By: Lowella Grip III M.D.   On: 01/12/2019 15:19   Ct Head Wo Contrast  Result Date: 01/12/2019 CLINICAL DATA:  68 year old male with recurrent syncope and dizziness EXAM: CT HEAD WITHOUT CONTRAST TECHNIQUE: Contiguous axial images were obtained  from the base of the skull through the vertex without intravenous contrast. COMPARISON:  MR 12/05/2018, CT 12/05/2018 FINDINGS: Brain: No acute intracranial hemorrhage. No midline shift or mass effect. Gray-white differentiation maintained. Encephalomalacia of the left temporal pole. Focal hypodensity in the right pons and thalamus, unchanged. Patchy hypodensity in the bilateral periventricular white matter. Vascular: Vascular calcifications. Skull: No acute fracture or aggressive bony lesions. Sinuses/Orbits: Unremarkable appearance of the visualized paranasal sinuses. Similar appearance of the left globe. Other: None IMPRESSION: Negative for acute intracranial abnormality. Evidence of chronic microvascular ischemic disease and prior lacunar infarctions, unchanged Electronically Signed   By: Cameron Adams D.O.   On: 01/12/2019 15:08    EKG: Orders placed or performed during the hospital encounter of 01/12/19  . EKG 12-Lead  . EKG 12-Lead  . EKG 12-Lead  . EKG 12-Lead  . ED EKG  . ED EKG    IMPRESSION AND PLAN: Patient 68 year old presenting with recurrent syncope  1.  Recurrent syncope etiology unclear Echo has been done recently I will not repeat Patient's will need cardiology evaluation again Likely will benefit from a monitor at home Obtain carotid Doppler I will also asked neurology to  see to see if that this they think that this could be related to a seizure  2.  Diabetes type 2 we will place on sliding scale insulin Monitor blood sugar  3.  Hyperlipidemia continue Lipitor  4.  Hypothyroidism continue Synthroid  5.  Chronic kidney disease stage III monitor renal function  6.  Elevated troponin likely due to demand ischemia follow troponin levels  7.  Miscellaneous Lovenox for DVT prophylaxis  All the records are reviewed and case discussed with ED provider. Management plans discussed with the patient, family and they are in agreement.  CODE STATUS: Code Status History     Date Active Date Inactive Code Status Order ID Comments User Context   12/04/2018 9735 12/08/2018 2140 Full Code [id number]  Gorden Harms, MD ED   06/22/2017 1735 06/29/2017 2042 Full Code [id number]  Vaughan Basta, MD Inpatient   06/16/2017 1849 06/19/2017 1841 Full Code [id number]  Vaughan Basta, MD Inpatient   05/17/2017 1550 05/20/2017 1744 Full Code [id number]  Idelle Crouch, MD Inpatient   09/13/2016 1756 09/16/2016 1856 Full Code [id number]  Fritzi Mandes, MD Inpatient       TOTAL TIME TAKING CARE OF THIS PATIENT: 55 minutes.    Dustin Flock M.D on 01/12/2019 at 3:27 PM  Between 7am to 6pm - Pager - [phone]  After 6pm go to [email or web address] - password [email or web address] [facility]  [phone]  CC: Primary care physician; [name], MD

## 2019-01-12 NOTE — Progress Notes (Signed)
ED called report.  Patient's last BP is 191/88.  RN reports physicians have not been notified of this BP and no treatment has been given.  Requested that MD is notified of the severely high BP and treatment is given prior to transfer.

## 2019-01-12 NOTE — ED Provider Notes (Signed)
[facility] Emergency Department Provider Note       Time seen: ----------------------------------------- 2:32 PM on 01/12/2019 -----------------------------------------   I have reviewed the triage vital signs and the nursing notes.  HISTORY   Chief Complaint Loss of Consciousness    HPI Cameron Adams is a 68 y.o. male with a history of anemia, chronic kidney disease, diabetes, hypertension, hypothyroidism, syncope who presents to the ED for near syncope.  Patient was seen by me for this 2 weeks ago.  He was pending an outpatient appointment with cardiology but has not had that appointment.  He denies any recent illness, denies fevers, chills, chest pain, shortness of breath, vomiting or diarrhea.  He reports no change in his medications.  He states he has been eating and drinking normally.  He did have decreased sensation in his right arm  Past Medical History:  Diagnosis Date  . Anemia of chronic disease   . Blindness of left eye   . Chronic kidney disease, stage III (moderate) (HCC)   . Diabetes ([place])   . Diarrhea   . Diastolic dysfunction    a. 06/2017 Echo: EF 55-60%, no rwma, Gr2 DD, Asc Ao 3.9cm, nl RV fxn, nl PASP.  Marland Kitchen Hypertension   . Hypothyroidism   . Monoclonal gammopathy of unknown significance (MGUS)    a. prev seen @ [facility].  Lost to f/u - never had BM bx.  . Orthostatic hypotension   . Syncope and collapse     Patient Active Problem List   Diagnosis Date Noted  . Weakness   . AKI (acute kidney injury) ([place]) 12/04/2018  . Dizziness 06/22/2017  . Hyperkalemia 06/16/2017  . Esophageal dysmotilities   . Dysphagia   . Rib fractures 05/17/2017  . Hemothorax, right 05/17/2017  . Recurrent major depressive disorder, in partial remission ([place]) 05/13/2017  . Pure hypercholesterolemia 05/13/2017  . IDDM (insulin dependent diabetes mellitus) ([place]) 05/13/2017  . Hypothyroidism (acquired) 05/13/2017  . GAD (generalized anxiety disorder)  05/13/2017  . CKD (chronic kidney disease) stage 3, GFR 30-59 ml/min (HCC) 05/13/2017  . Toe gangrene ([place]) 09/16/2016  . PVD (peripheral vascular disease) ([place]) 09/16/2016  . Uncontrolled diabetes mellitus ([place]) 09/16/2016  . Hyponatremia 09/16/2016  . Hypokalemia 09/16/2016  . Essential hypertension, malignant 09/16/2016  . Abnormal angiogram 09/16/2016  . Gangrene ([place]) 09/13/2016  . Pruritus 03/26/2016  . Orthostatic hypotension 03/26/2016  . 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 Alcohol abuse 03/26/2016  . Hyperglycemia 03/23/2016  . Passive suicidal ideations 09/16/2014  . Dyspnea 11/09/2013  . CAP (community acquired pneumonia) 10/31/2013  . Chest pain 10/31/2013  . Chronic respiratory failure with hypoxia ([place]) 10/31/2013  . 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 Diabetic retinopathy ([place]) 06/16/2013  . Gynecomastia 04/18/2013    Past Surgical History:  Procedure Laterality Date  . 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 PERIPHERAL VASCULAR CATHETERIZATION N/A 09/15/2016   Procedure: Lower Extremity Angiography;  Surgeon: Algernon Huxley, MD;  Location: [facility];  Service: Cardiovascular;  Laterality: N/A;  . THYROID SURGERY      Allergies Gabapentin  Social History Social History   Tobacco Use  . Smoking status: Former Smoker    Packs/day: 0.10    Years: 25.00    Pack years: 2.50    Types: Cigarettes    Last attempt to quit: 10/31/1978  Years since quitting: 40.2  . Smokeless tobacco: Never Used  . Tobacco comment: smoked 2-3 cigarettes/day  Substance Use Topics  . Alcohol use: No    Comment: previously drank heavily but not in many years  . Drug use: No   Review of Systems Constitutional: Negative for fever. Cardiovascular: Negative for chest pain. Respiratory: Negative for shortness of  breath. Gastrointestinal: Negative for abdominal pain, vomiting and diarrhea. Musculoskeletal: Negative for back pain. Skin: Negative for rash. Neurological: Negative for headaches, focal weakness or numbness.  All systems negative/normal/unremarkable except as stated in the HPI  ____________________________________________   PHYSICAL EXAM:  VITAL SIGNS: ED Triage Vitals  Enc Vitals Group     BP      Pulse      Resp      Temp      Temp src      SpO2      Weight      Height      Head Circumference      Peak Flow      Pain Score      Pain Loc      Pain Edu?      Excl. in [place]?     Constitutional: Alert and oriented.  No acute distress Eyes: Conjunctivae are normal. Normal extraocular movements. ENT      Head: Normocephalic and atraumatic.      Nose: No congestion/rhinnorhea.      Mouth/Throat: Mucous membranes are moist.      Neck: No stridor. Cardiovascular: Normal rate, regular rhythm. No murmurs, rubs, or gallops. Respiratory: Normal respiratory effort without tachypnea nor retractions. Breath sounds are clear and equal bilaterally. No wheezes/rales/rhonchi. Gastrointestinal: Soft and nontender. Normal bowel sounds Musculoskeletal: Nontender with normal range of motion in extremities. No lower extremity tenderness nor edema. Neurologic:  Normal speech and language. No gross focal neurologic deficits are appreciated.  Generalized weakness, nothing focal.  No obvious strength or sensation deficits Skin:  Skin is warm, dry and intact. No rash noted. Psychiatric: Mood and affect are normal. Speech and behavior are normal.  ____________________________________________  EKG: Interpreted by me.  Sinus rhythm with a rate of 65 bpm, left axis deviation, long QT  ____________________________________________  ED COURSE:  As part of my medical decision making, I reviewed the following data within the [place] History obtained from family if available,  nursing notes, old chart and ekg, as well as notes from prior ED visits. Patient presented for near syncope, we will assess with labs and imaging as indicated at this time.   Procedures  RANDOM [name] was evaluated in Emergency Department on 01/12/2019 for the symptoms described in the history of present illness. He was evaluated in the context of the global COVID-19 pandemic, which necessitated consideration that the patient might be at risk for infection with the SARS-CoV-2 virus that causes COVID-19. Institutional protocols and algorithms that pertain to the evaluation of patients at risk for COVID-19 are in a state of rapid change based on information released by regulatory bodies including the CDC and federal and state organizations. These policies and algorithms were followed during the patient's care in the ED.  ____________________________________________   LABS (pertinent positives/negatives)  Labs Reviewed  CBC WITH DIFFERENTIAL/PLATELET - Abnormal; Notable for the following components:      Result Value   RBC 3.82 (*)    Hemoglobin 10.0 (*)    HCT 31.0 (*)    RDW 19.9 (*)    Platelets 135 (*)  Eosinophils Absolute 0.6 (*)    All other components within normal limits  COMPREHENSIVE METABOLIC PANEL - Abnormal; Notable for the following components:   Chloride 117 (*)    CO2 18 (*)    Glucose, Bld 220 (*)    BUN 31 (*)    Creatinine, Ser 2.79 (*)    Calcium 8.2 (*)    Albumin 3.1 (*)    GFR calc non Af Amer 22 (*)    GFR calc Af Amer 26 (*)    Anion gap 4 (*)    All other components within normal limits  TROPONIN I - Abnormal; Notable for the following components:   Troponin I 0.12 (*)    All other components within normal limits  URINALYSIS, COMPLETE (UACMP) WITH MICROSCOPIC    RADIOLOGY Images were viewed by me  CT head IMPRESSION: Negative for acute intracranial abnormality.  Evidence of chronic microvascular ischemic disease and prior  lacunar infarctions, unchanged Chest x-ray is unremarkable  ____________________________________________   DIFFERENTIAL DIAGNOSIS   Arrhythmia, dehydration, electrolyte abnormality, MI, CVA  FINAL ASSESSMENT AND PLAN  Near syncope, chronic kidney disease, chronically elevated troponin   Plan: The patient had presented for near syncope.  Labs do not look significantly changed from prior.  He was sent here by his PMD.  I will discuss with the hospitalist for admission at this time.   Laurence Aly, MD    Note: This note was generated in part or whole with voice recognition software. Voice recognition is usually quite accurate but there are transcription errors that can and very often do occur. I apologize for any typographical errors that were not detected and corrected.     Earleen Newport, MD 01/12/19 [phone]

## 2019-01-12 NOTE — ED Notes (Signed)
Daughter's number [facility] is [phone]

## 2019-01-12 NOTE — ED Notes (Signed)
Admitting at bedside 

## 2019-01-12 NOTE — Progress Notes (Signed)
Advanced care plan.  Purpose of the Encounter: CODE STATUS  Parties in Attendance: Patient himself  Patient's Decision Capacity: Intact  Subjective/Patient's story: Cameron Adams  is a 68 y.o. male with a known history of multiple medical problems, including chronic kidney disease, diabetes, chronic diastolic CHF, hypertension, hypothyroidism who is presenting to the hospital with episode of syncope.  Patient was seen in the emergency room treatment 2 weeks ago with similar type of presentation.    Objective/Medical story I discussed with the patient regarding his desires for cardiac and pulmonary resuscitation explained him what a CODE STATUS was   Goals of care determination:  Patient states that he wants to be a full code.  He will discuss with his daughter regarding healthcare power of attorney and living will   CODE STATUS:  Full code  Time spent discussing advanced care planning: 16 minutes

## 2019-01-12 NOTE — ED Notes (Addendum)
RN refusing pt at this time due to BP. Dr Joellyn Quails. See new orders.

## 2019-01-12 NOTE — Consult Note (Signed)
[facility] Card[facility]  CARDIOLOGY CONSULT NOTE  Patient ID: Cameron Adams MRN: [id number] DOB/AGE: [birth year] 68 y.o.  Admit date: 01/12/2019 Referring Physician Posey Pronto Primary Physician [place] Primary Cardiologist [facility] Reason for Consultation Recurrent syncope  HPI: 68 year old gentleman referred for evaluation of recurrent syncope. The patient has a history of type II diabetes, CKD stage IV, hypertension, history of TIA, and orthostatic hypotension. The patient had a telemedicine appointment with his primary care provider today to discuss issues with his blood pressure. Just prior to the scheduled visit, the patient was in his usual state of health, eating lunch, when he had acute right arm numbness, slurred speech, and felt out of it. He stood up to walk upstairs to lie down on his bed, but his daughter told him to sit down on the step. The patient is unsure if he lost consciousness, but felt out of it. He denies any chest pain. He denied headache, dizziness, or lightheadedness. His primary care provider advised the daughter to call 911. Head CT was negative for acute intracranial abnormality, with evidence of chronic microvascular ischemic disease and prior lacunar infarctions. Chest xray negative for pulmonary edema or consolidation. Admission labs notable for troponin 0.12, which is not new, and is lower than previous recent troponin, creatinine 2.79, BUN 31, hemoglobin 10, hematocrit 31. ECG revealed sinus rhythm with LAFB and RBBB at a rate of 64 bpm. The patient was recently evaluated in the ER 2 weeks ago for a similar presentation, which occurred in the setting of undergoing outpatient nuclear stress test, though this time, 20 minutes after receiving Lexiscan injection, he became hypotensive and had left arm numbess. He was given IV fluids in the ER and discharged home. He was also admitted 12/08/2018 for suspected TIA with acute left sided weakness and facial droop. 2D echocardiogram during  that admission revealed LVEF 30-35%, without significant valvular insufficiency. 2D echocardiogram in 2018 revealed LVEF 55-60%. Carotid ultrasound 12/04/2018 revealed less than 50% stenosis bilaterally. Currently, the patient reports feeling back to his baseline without unilateral arm or leg weakness. He denies chest pain, shortness of breath, palpitations, dizziness, lightheadedness, headache, or peripheral edema. In the recent past, he has had no orthopnea or notable peripheral edema per his account. He reports unintentional weight loss in the last 6 months, as well as 6 month history of neck and back pain.  Review of systems complete and found to be negative unless listed above     Past Medical History:  Diagnosis Date  . Anemia of chronic disease   . Blindness of left eye   . Chronic kidney disease, stage III (moderate) (HCC)   . Diabetes ([place])   . Diarrhea   . Diastolic dysfunction    a. 06/2017 Echo: EF 55-60%, no rwma, Gr2 DD, Asc Ao 3.9cm, nl RV fxn, nl PASP.  Marland Kitchen Hypertension   . Hypothyroidism   . Monoclonal gammopathy of unknown significance (MGUS)    a. prev seen @ [facility].  Lost to f/u - never had BM bx.  . Orthostatic hypotension   . Syncope and collapse     Past Surgical History:  Procedure Laterality Date  . 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 PERIPHERAL VASCULAR CATHETERIZATION N/A 09/15/2016   Procedure: Lower Extremity Angiography;  Surgeon: Algernon Huxley, MD;  Location: [place] [facility];  Service: Cardiovascular;  Laterality: N/A;  . THYROID SURGERY      (  Not in a hospital admission)  Social History   Socioeconomic History  . Marital status: Single    Spouse name: Not on file  . Number of children: Not on file  . Years of education: Not on file  . Highest education level: Not on file  Occupational History  . Occupation: Retired  Scientific laboratory technician   . Financial resource strain: Not on file  . Food insecurity:    Worry: Not on file    Inability: Not on file  . Transportation needs:    Medical: Not on file    Non-medical: Not on file  Tobacco Use  . Smoking status: Former Smoker    Packs/day: 0.10    Years: 25.00    Pack years: 2.50    Types: Cigarettes    Last attempt to quit: 10/31/1978    Years since quitting: 40.2  . Smokeless tobacco: Never Used  . Tobacco comment: smoked 2-3 cigarettes/day  Substance and Sexual Activity  . Alcohol use: No    Comment: previously drank heavily but not in many years  . Drug use: No  . Sexual activity: Not on file  Lifestyle  . Physical activity:    Days per week: Not on file    Minutes per session: Not on file  . Stress: Not on file  Relationships  . Social connections:    Talks on phone: Not on file    Gets together: Not on file    Attends religious service: Not on file    Active member of club or organization: Not on file    Attends meetings of clubs or organizations: Not on file    Relationship status: Not on file  . Intimate partner violence:    Fear of current or ex partner: Not on file    Emotionally abused: Not on file    Physically abused: Not on file    Forced sexual activity: Not on file  Other Topics Concern  . Not on file  Social History Narrative   Lives in [place] by himself.  Says he is very active at home but does not routinely exercise.  Usual diet is poor - some days he might only drink a few cups of coffee.    Family History  Problem Relation Age of Onset  . Kidney failure Mother   . Heart disease Father   . Kidney failure Father   . Heart disease Brother       Review of systems complete and found to be negative unless listed above      PHYSICAL EXAM  General: Frail gentleman, appears older than stated age, pleasant, resting in bed, in no acute distress HEENT:  Normocephalic and atramatic Neck:  No JVD.  Lungs: normal effort of breathing on room  air, no wheezing, no crackles Heart: HRRR . Normal S1 and S2 without gallops or murmurs.  Abdomen: nondistended Msk:  No obvious defromity Extremities: No clubbing, cyanosis or edema.   Neuro: Alert and oriented X 3. Grossly intact. Hand grip equal bilaterally. Psych:  Good affect, responds appropriately  Labs:   Lab Results  Component Value Date   WBC 4.1 01/12/2019   HGB 10.0 (L) 01/12/2019   HCT 31.0 (L) 01/12/2019   MCV 81.2 01/12/2019   PLT 135 (L) 01/12/2019    Recent Labs  Lab 01/12/19 1439  NA 139  K 4.7  CL 117*  CO2 18*  BUN 31*  CREATININE 2.79*  CALCIUM 8.2*  PROT 6.5  BILITOT 0.6  ALKPHOS 91  ALT 28  AST 27  GLUCOSE 220*   Lab Results  Component Value Date   CKTOTAL 102 10/10/2013   CKMB 0.7 10/10/2013   TROPONINI 0.12 (HH) 01/12/2019    Lab Results  Component Value Date   CHOL 102 12/05/2018   Lab Results  Component Value Date   HDL 48 12/05/2018   Lab Results  Component Value Date   LDLCALC 46 12/05/2018   Lab Results  Component Value Date   TRIG 40 12/05/2018   Lab Results  Component Value Date   CHOLHDL 2.1 12/05/2018   No results found for: LDLDIRECT    Radiology: Dg Chest 1 View  Result Date: 01/12/2019 CLINICAL DATA:  Weakness and syncope EXAM: CHEST  1 VIEW COMPARISON:  December 29, 2018 FINDINGS: There is no appreciable edema or consolidation. Heart size and pulmonary vascularity are within normal limits. No adenopathy. A small metallic foreign body is noted slightly to the left of midline at the T2 level, stable. There is degenerative change in the thoracic spine. IMPRESSION: No edema or consolidation.  Stable cardiac silhouette. Electronically Signed   By: Lowella Grip III M.D.   On: 01/12/2019 15:19   Dg Chest 1 View  Result Date: 12/29/2018 CLINICAL DATA:  Syncopal episode EXAM: CHEST  1 VIEW COMPARISON:  12/04/2018 FINDINGS: Cardiac shadows within normal limits. The lungs are well aerated bilaterally. Elevation of the  right hemidiaphragm is seen. No focal effusion is noted. No acute bony abnormality is seen. IMPRESSION: No active disease. Electronically Signed   By: Inez Catalina M.D.   On: 12/29/2018 11:54   Ct Head Wo Contrast  Result Date: 01/12/2019 CLINICAL DATA:  68 year old male with recurrent syncope and dizziness EXAM: CT HEAD WITHOUT CONTRAST TECHNIQUE: Contiguous axial images were obtained from the base of the skull through the vertex without intravenous contrast. COMPARISON:  MR 12/05/2018, CT 12/05/2018 FINDINGS: Brain: No acute intracranial hemorrhage. No midline shift or mass effect. Gray-white differentiation maintained. Encephalomalacia of the left temporal pole. Focal hypodensity in the right pons and thalamus, unchanged. Patchy hypodensity in the bilateral periventricular white matter. Vascular: Vascular calcifications. Skull: No acute fracture or aggressive bony lesions. Sinuses/Orbits: Unremarkable appearance of the visualized paranasal sinuses. Similar appearance of the left globe. Other: None IMPRESSION: Negative for acute intracranial abnormality. Evidence of chronic microvascular ischemic disease and prior lacunar infarctions, unchanged Electronically Signed   By: Corrie Mckusick D.O.   On: 01/12/2019 15:08    EKG: sinus rhythm, rate 60s  ASSESSMENT AND PLAN:  1. Recurrent syncope and near syncope of unknown etiology. Carotid ultrasound 11/2018 revealed less than 50% stenosis bilaterally. The patient was admitted for similar presentation 12/05/2018. Symptoms were felt to be secondary to orthostatic hypotension and TIA with unilateral left arm numbness 2. Left ventricular dysfunction with LVEF 30-35% per recent echocardiogram 12/05/2018. Unable to complete outpatient Lexiscan Myoview for further evaluation as patient became hypotensive after Lexiscan injection. 3. CKD stage IV 4. Orthostatic hypotension 5. Type II diabetes  Recommendations: 1. Monitor on telemetry for arrhthymias, heart block 2.  Consider neurology involvement. ?vertebrobasilar ischemia 3. Plan to assess LV dysfunction further as outpatient with Dr. Clayborn Bigness 4. No further cardiac diagnostics recommended at this time.  Signed: Clabe Seal PA-C 01/12/2019, 4:00 PM

## 2019-01-12 NOTE — ED Notes (Signed)
ED TO INPATIENT HANDOFF REPORT  ED Nurse Name and Phone #: Janett Billow 9  S Name/Age/Gender Cameron Adams 68 y.o. male Room/Bed: ED13A/ED13A  Code Status   Code Status: Prior  Home/SNF/Other Home Patient oriented to: self, place, time and situation Is this baseline? Yes   Triage Complete: Triage complete  Chief Complaint syncope  Triage Note Patient arrived via [facility]. Family reported he was eating lunch took a pause  And then  became unresponsive for 1 min  he felt dizzy prior. vitals stable.   Allergies Allergies  Allergen Reactions  . Gabapentin     Other reaction(s): Other (See Comments) Trouble sleeping     Level of Care/Admitting Diagnosis ED Disposition    ED Disposition Condition [place]: [place] [100120]  Level of Care: Telemetry [5]  Diagnosis: Syncope and collapse [780.2.ICD-9-CM]  Admitting Physician: Dustin Flock [[id number]]  Attending Physician: Dustin Flock [[id number]]  PT Class (Do Not Modify): Observation [104]  PT Acc Code (Do Not Modify): Observation [10022]       B Medical/Surgery History Past Medical History:  Diagnosis Date  . Anemia of chronic disease   . Blindness of left eye   . Chronic kidney disease, stage III (moderate) (HCC)   . Diabetes ([place])   . Diarrhea   . Diastolic dysfunction    a. 06/2017 Echo: EF 55-60%, no rwma, Gr2 DD, Asc Ao 3.9cm, nl RV fxn, nl PASP.  Marland Kitchen Hypertension   . Hypothyroidism   . Monoclonal gammopathy of unknown significance (MGUS)    a. prev seen @ [facility].  Lost to f/u - never had BM bx.  . Orthostatic hypotension   . Syncope and collapse    Past Surgical History:  Procedure Laterality Date  . 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 PERIPHERAL VASCULAR CATHETERIZATION N/A 09/15/2016   Procedure: Lower Extremity Angiography;   Surgeon: Algernon Huxley, MD;  Location: [place] [facility];  Service: Cardiovascular;  Laterality: N/A;  . THYROID SURGERY       A IV Location/Drains/Wounds Patient Lines/Drains/Airways Status   Active Line/Drains/Airways    Name:   Placement date:   Placement time:   Site:   Days:   Peripheral IV 01/12/19 Left Antecubital   01/12/19    1510    Antecubital   less than 1   Post Cath / Sheath 09/15/16   09/15/16    1446    -   849   Wound / Incision (Open or Dehisced) 09/13/16 Diabetic ulcer Toe (Comment  which one) Right right 2nd toe   09/13/16    1947    Toe (Comment  which one)   851   Wound / Incision (Open or Dehisced) 05/19/17 Non-pressure wound Back Left   05/19/17    0850    Back   603          Intake/Output Last 24 hours No intake or output data in the 24 hours ending 01/12/19 1550  Labs/Imaging Results for orders placed or performed during the hospital encounter of 01/12/19 (from the past 48 hour(s))  CBC with Differential     Status: Abnormal   Collection Time: 01/12/19  2:39 PM  Result Value Ref Range   WBC 4.1 4.0 - 10.5 K/uL   RBC 3.82 (L) 4.22 - 5.81 MIL/uL   Hemoglobin 10.0 (L)  13.0 - 17.0 g/dL   HCT 31.0 (L) 39.0 - 52.0 %   MCV 81.2 80.0 - 100.0 fL   MCH 26.2 26.0 - 34.0 pg   MCHC 32.3 30.0 - 36.0 g/dL   RDW 19.9 (H) 11.5 - 15.5 %   Platelets 135 (L) 150 - 400 K/uL   nRBC 0.0 0.0 - 0.2 %   Neutrophils Relative % 44 %   Neutro Abs 1.8 1.7 - 7.7 K/uL   Lymphocytes Relative 29 %   Lymphs Abs 1.2 0.7 - 4.0 K/uL   Monocytes Relative 12 %   Monocytes Absolute 0.5 0.1 - 1.0 K/uL   Eosinophils Relative 14 %   Eosinophils Absolute 0.6 (H) 0.0 - 0.5 K/uL   Basophils Relative 1 %   Basophils Absolute 0.0 0.0 - 0.1 K/uL   Immature Granulocytes 0 %   Abs Immature Granulocytes 0.00 0.00 - 0.07 K/uL    Comment: Performed at [facility], [address] [place], [place] [address]  Comprehensive metabolic panel     Status: Abnormal   Collection Time:  01/12/19  2:39 PM  Result Value Ref Range   Sodium 139 135 - 145 mmol/L   Potassium 4.7 3.5 - 5.1 mmol/L   Chloride 117 (H) 98 - 111 mmol/L   CO2 18 (L) 22 - 32 mmol/L   Glucose, Bld 220 (H) 70 - 99 mg/dL   BUN 31 (H) 8 - 23 mg/dL   Creatinine, Ser 2.79 (H) 0.61 - 1.24 mg/dL   Calcium 8.2 (L) 8.9 - 10.3 mg/dL   Total Protein 6.5 6.5 - 8.1 g/dL   Albumin 3.1 (L) 3.5 - 5.0 g/dL   AST 27 15 - 41 U/L   ALT 28 0 - 44 U/L   Alkaline Phosphatase 91 38 - 126 U/L   Total Bilirubin 0.6 0.3 - 1.2 mg/dL   GFR calc non Af Amer 22 (L) >60 mL/min   GFR calc Af Amer 26 (L) >60 mL/min   Anion gap 4 (L) 5 - 15    Comment: Performed at [facility], [address] [place], [place] [address]  Troponin I - ONCE - STAT     Status: Abnormal   Collection Time: 01/12/19  2:39 PM  Result Value Ref Range   Troponin I 0.12 (HH) <0.03 ng/mL    Comment: CRITICAL RESULT CALLED TO, READ BACK BY AND VERIFIED WITH JESSICA Deasia Chiu AT 1512 01/12/2019.  TFK Performed at [facility], [address] [place], [place] [address]    Dg Chest 1 View  Result Date: 01/12/2019 CLINICAL DATA:  Weakness and syncope EXAM: CHEST  1 VIEW COMPARISON:  December 29, 2018 FINDINGS: There is no appreciable edema or consolidation. Heart size and pulmonary vascularity are within normal limits. No adenopathy. A small metallic foreign body is noted slightly to the left of midline at the T2 level, stable. There is degenerative change in the thoracic spine. IMPRESSION: No edema or consolidation.  Stable cardiac silhouette. Electronically Signed   By: Lowella Grip III M.D.   On: 01/12/2019 15:19   Ct Head Wo Contrast  Result Date: 01/12/2019 CLINICAL DATA:  68 year old male with recurrent syncope and dizziness EXAM: CT HEAD WITHOUT CONTRAST TECHNIQUE: Contiguous axial images were obtained from the base of the skull through the vertex without intravenous contrast. COMPARISON:  MR 12/05/2018, CT 12/05/2018 FINDINGS: Brain:  No acute intracranial hemorrhage. No midline shift or mass effect. Gray-white differentiation maintained. Encephalomalacia of the left temporal pole.  Focal hypodensity in the right pons and thalamus, unchanged. Patchy hypodensity in the bilateral periventricular white matter. Vascular: Vascular calcifications. Skull: No acute fracture or aggressive bony lesions. Sinuses/Orbits: Unremarkable appearance of the visualized paranasal sinuses. Similar appearance of the left globe. Other: None IMPRESSION: Negative for acute intracranial abnormality. Evidence of chronic microvascular ischemic disease and prior lacunar infarctions, unchanged Electronically Signed   By: Corrie Mckusick D.O.   On: 01/12/2019 15:08    Pending Labs Unresulted Labs (From admission, onward)    Start     Ordered   01/12/19 1440  Urinalysis, Complete w Microscopic  (ALOC)  ONCE - STAT,   STAT     01/12/19 1439   Signed and Held  CBC  (enoxaparin (LOVENOX)    CrCl >/= 30 ml/min)  Once,   R    Comments:  Baseline for enoxaparin therapy IF NOT ALREADY DRAWN.  Notify MD if PLT < 100 K.    Signed and Held   Signed and Held  Creatinine, serum  (enoxaparin (LOVENOX)    CrCl >/= 30 ml/min)  Once,   R    Comments:  Baseline for enoxaparin therapy IF NOT ALREADY DRAWN.    Signed and Held   Signed and Held  Creatinine, serum  (enoxaparin (LOVENOX)    CrCl >/= 30 ml/min)  Weekly,   R    Comments:  while on enoxaparin therapy    Signed and Held          Vitals/Pain Today's Vitals   01/12/19 1431 01/12/19 1435 01/12/19 1436 01/12/19 1514  BP:   (!) 154/70 (!) 148/71  Pulse:  60 64 (!) 58  Resp:  15 11 19   Temp:   97.8 F (36.6 C)   TempSrc:   Oral   SpO2:  100% 100% 100%  Weight: 70.3 kg     Height: 5\' 11"  (1.803 m)       Isolation Precautions No active isolations  Medications Medications  insulin aspart (novoLOG) injection 0-9 Units (has no administration in time range)    Mobility walks with device High fall risk    Focused Assessments Neuro Assessment Handoff:  Swallow screen pass? Not done this admission Cardiac Rhythm: Normal sinus rhythm, Bundle branch block       Neuro Assessment: Exceptions to WDL(syncopal episode; pt also slow to answer questions) Neuro Checks:      Last Documented NIHSS Modified Score:   Has TPA been given? No If patient is a Neuro Trauma and patient is going to OR before floor call report to [facility] nurse: [phone] or [phone]     R Recommendations: See Admitting Provider Note  Report given to:   Additional Notes: pt has these frequent episodes where daughter says he will go unresponsive and have facial drop, slurred speech, and weakness on one side. Pt has been seen in ED before for these TIAs. Pt did not follow up with referrals from last ED visit.

## 2019-01-12 NOTE — Progress Notes (Signed)
Anticoagulation monitoring(Lovenox):  68 yo male ordered Lovenox 40 mg Q24h  Filed Weights   01/12/19 1431  Weight: 155 lb (70.3 kg)   BMI    Lab Results  Component Value Date   CREATININE 2.79 (H) 01/12/2019   CREATININE 2.97 (H) 12/29/2018   CREATININE 2.59 (H) 12/08/2018   Estimated Creatinine Clearance: 25.2 mL/min (A) (by C-G formula based on SCr of 2.79 mg/dL (H)). Hemoglobin & Hematocrit     Component Value Date/Time   HGB 10.0 (L) 01/12/2019 1439   HGB 10.3 (L) 10/14/2013 0435   HCT 31.0 (L) 01/12/2019 1439   HCT 31.1 (L) 10/14/2013 0435     Per Protocol for Patient with estCrcl < 30 ml/min and BMI < 40, will transition to Lovenox 30 mg Q24h.

## 2019-01-12 NOTE — ED Notes (Signed)
Pt attempted to urinate but was unable to at this time.

## 2019-01-12 NOTE — ED Triage Notes (Signed)
Patient arrived via [facility]. Family reported he was eating lunch took a pause  And then  became unresponsive for 1 min  he felt dizzy prior. vitals stable.

## 2019-01-12 NOTE — ED Notes (Signed)
Daughter updated by phone about pt status. Daughter made ware that pt can be called through main ED line to reach him.

## 2019-01-13 ENCOUNTER — Inpatient Hospital Stay: Payer: Medicare Other

## 2019-01-13 ENCOUNTER — Observation Stay: Payer: Medicare Other

## 2019-01-13 DIAGNOSIS — Z87891 Personal history of nicotine dependence: Secondary | ICD-10-CM | POA: Diagnosis not present

## 2019-01-13 DIAGNOSIS — I5042 Chronic combined systolic (congestive) and diastolic (congestive) heart failure: Secondary | ICD-10-CM | POA: Diagnosis present

## 2019-01-13 DIAGNOSIS — D631 Anemia in chronic kidney disease: Secondary | ICD-10-CM | POA: Diagnosis present

## 2019-01-13 DIAGNOSIS — Z888 Allergy status to other drugs, medicaments and biological substances status: Secondary | ICD-10-CM | POA: Diagnosis not present

## 2019-01-13 DIAGNOSIS — R55 Syncope and collapse: Secondary | ICD-10-CM | POA: Diagnosis not present

## 2019-01-13 DIAGNOSIS — E860 Dehydration: Secondary | ICD-10-CM | POA: Diagnosis present

## 2019-01-13 DIAGNOSIS — R29711 NIHSS score 11: Secondary | ICD-10-CM | POA: Diagnosis present

## 2019-01-13 DIAGNOSIS — I4510 Unspecified right bundle-branch block: Secondary | ICD-10-CM | POA: Diagnosis present

## 2019-01-13 DIAGNOSIS — Z89421 Acquired absence of other right toe(s): Secondary | ICD-10-CM | POA: Diagnosis not present

## 2019-01-13 DIAGNOSIS — Z7902 Long term (current) use of antithrombotics/antiplatelets: Secondary | ICD-10-CM | POA: Diagnosis not present

## 2019-01-13 DIAGNOSIS — E1122 Type 2 diabetes mellitus with diabetic chronic kidney disease: Secondary | ICD-10-CM | POA: Diagnosis present

## 2019-01-13 DIAGNOSIS — R4781 Slurred speech: Secondary | ICD-10-CM | POA: Diagnosis not present

## 2019-01-13 DIAGNOSIS — I248 Other forms of acute ischemic heart disease: Secondary | ICD-10-CM | POA: Diagnosis present

## 2019-01-13 DIAGNOSIS — I6523 Occlusion and stenosis of bilateral carotid arteries: Secondary | ICD-10-CM | POA: Diagnosis present

## 2019-01-13 DIAGNOSIS — I130 Hypertensive heart and chronic kidney disease with heart failure and stage 1 through stage 4 chronic kidney disease, or unspecified chronic kidney disease: Secondary | ICD-10-CM | POA: Diagnosis present

## 2019-01-13 DIAGNOSIS — N183 Chronic kidney disease, stage 3 (moderate): Secondary | ICD-10-CM | POA: Diagnosis present

## 2019-01-13 DIAGNOSIS — E039 Hypothyroidism, unspecified: Secondary | ICD-10-CM | POA: Diagnosis present

## 2019-01-13 DIAGNOSIS — D472 Monoclonal gammopathy: Secondary | ICD-10-CM | POA: Diagnosis present

## 2019-01-13 DIAGNOSIS — I951 Orthostatic hypotension: Principal | ICD-10-CM

## 2019-01-13 DIAGNOSIS — Z7982 Long term (current) use of aspirin: Secondary | ICD-10-CM | POA: Diagnosis not present

## 2019-01-13 DIAGNOSIS — H5462 Unqualified visual loss, left eye, normal vision right eye: Secondary | ICD-10-CM | POA: Diagnosis present

## 2019-01-13 DIAGNOSIS — R451 Restlessness and agitation: Secondary | ICD-10-CM | POA: Diagnosis present

## 2019-01-13 DIAGNOSIS — E785 Hyperlipidemia, unspecified: Secondary | ICD-10-CM | POA: Diagnosis present

## 2019-01-13 DIAGNOSIS — R634 Abnormal weight loss: Secondary | ICD-10-CM | POA: Diagnosis present

## 2019-01-13 DIAGNOSIS — Z79899 Other long term (current) drug therapy: Secondary | ICD-10-CM | POA: Diagnosis not present

## 2019-01-13 LAB — GLUCOSE, CAPILLARY
Glucose-Capillary: 134 mg/dL — ABNORMAL HIGH (ref 70–99)
Glucose-Capillary: 136 mg/dL — ABNORMAL HIGH (ref 70–99)
Glucose-Capillary: 191 mg/dL — ABNORMAL HIGH (ref 70–99)
Glucose-Capillary: 215 mg/dL — ABNORMAL HIGH (ref 70–99)
Glucose-Capillary: 78 mg/dL (ref 70–99)
Glucose-Capillary: 79 mg/dL (ref 70–99)
Glucose-Capillary: 98 mg/dL (ref 70–99)

## 2019-01-13 LAB — MRSA PCR SCREENING: MRSA by PCR: NEGATIVE

## 2019-01-13 LAB — TROPONIN I: Troponin I: 0.16 ng/mL (ref <0.03)

## 2019-01-13 MED ORDER — HYDRALAZINE HCL 20 MG/ML IJ SOLN
5.0000 mg | Freq: Once | INTRAMUSCULAR | Status: AC
  Administered 2019-01-13: 14:00:00 5 mg via INTRAVENOUS
  Filled 2019-01-13: qty 1

## 2019-01-13 MED ORDER — SODIUM CHLORIDE 0.9 % IV SOLN: INTRAVENOUS | Status: DC

## 2019-01-13 MED ORDER — SODIUM CHLORIDE 0.9 % IV SOLN
INTRAVENOUS | Status: DC
  Administered 2019-01-13: 100 mL/h via INTRAVENOUS
  Administered 2019-01-13 – 2019-01-15 (×2): via INTRAVENOUS

## 2019-01-13 MED ORDER — GLUCAGON HCL RDNA (DIAGNOSTIC) 1 MG IJ SOLR
INTRAMUSCULAR | Status: AC
  Administered 2019-01-13: 12:00:00 1 mg
  Filled 2019-01-13: qty 1

## 2019-01-13 NOTE — Progress Notes (Addendum)
[facility] NAME: Cameron Adams    MR#:  [id number]  DATE OF BIRTH:  December 18, 1950  SUBJECTIVE:  CHIEF COMPLAINT:   Chief Complaint  Patient presents with  . Loss of Consciousness  Patient seen and evaluated today Was orthostatic this morning But around 11:40 AM patient had some slurred speech and code stroke was activated Blood pressure was elevated Attended by Dr. Doy Mince at bedside Patient had stat CAT scan and moved to stepdown unit Patient seen in the unit He is awake and responds to all verbal commands No difficulty in speech Moves all extremities No numbness or tingling sensation  REVIEW OF SYSTEMS:    ROS  CONSTITUTIONAL: No documented fever. No fatigue, weakness. No weight gain, no weight loss.  EYES: No blurry or double vision.  ENT: No tinnitus. No postnasal drip. No redness of the oropharynx.  RESPIRATORY: No cough, no wheeze, no hemoptysis. No dyspnea.  CARDIOVASCULAR: No chest pain. No orthopnea. No palpitations. No syncope.  GASTROINTESTINAL: No nausea, no vomiting or diarrhea. No abdominal pain. No melena or hematochezia.  GENITOURINARY: No dysuria or hematuria.  ENDOCRINE: No polyuria or nocturia. No heat or cold intolerance.  HEMATOLOGY: No anemia. No bruising. No bleeding.  INTEGUMENTARY: No rashes. No lesions.  MUSCULOSKELETAL: No arthritis. No swelling. No gout.  NEUROLOGIC: No numbness, tingling, or ataxia. No seizure-type activity.  Had an episode of slurred speech this morning Mild dizziness PSYCHIATRIC: No anxiety. No insomnia. No ADD.   DRUG ALLERGIES:   Allergies  Allergen Reactions  . Gabapentin     Other reaction(s): Other (See Comments) Trouble sleeping     VITALS:  Blood pressure (!) 210/99, pulse 75, temperature 97.7 F (36.5 C), resp. rate 15, height 5\' 7"  (1.702 m), weight 68.7 kg, SpO2 100 %.  PHYSICAL EXAMINATION:   Physical Exam  GENERAL:  68 y.o.-year-old patient lying  in the bed with no acute distress.  EYES: Pupils equal, round, reactive to light and accommodation. No scleral icterus. Extraocular muscles intact.  HEENT: Head atraumatic, normocephalic. Oropharynx and nasopharynx clear.  NECK:  Supple, no jugular venous distention. No thyroid enlargement, no tenderness.  LUNGS: Normal breath sounds bilaterally, no wheezing, rales, rhonchi. No use of accessory muscles of respiration.  CARDIOVASCULAR: S1, S2 normal. No murmurs, rubs, or gallops.  ABDOMEN: Soft, nontender, nondistended. Bowel sounds present. No organomegaly or mass.  EXTREMITIES: No cyanosis, clubbing or edema b/l.    NEUROLOGIC: Cranial nerves II through XII are intact. No focal Motor or sensory deficits b/l.   PSYCHIATRIC: The patient is alert and oriented x 3.  SKIN: No obvious rash, lesion, or ulcer.   LABORATORY PANEL:   CBC Recent Labs  Lab 01/12/19 1439  WBC 4.1  HGB 10.0*  HCT 31.0*  PLT 135*   ------------------------------------------------------------------------------------------------------------------ Chemistries  Recent Labs  Lab 01/12/19 1439  NA 139  K 4.7  CL 117*  CO2 18*  GLUCOSE 220*  BUN 31*  CREATININE 2.79*  CALCIUM 8.2*  AST 27  ALT 28  ALKPHOS 91  BILITOT 0.6   ------------------------------------------------------------------------------------------------------------------  Cardiac Enzymes Recent Labs  Lab 01/13/19 0419  TROPONINI 0.16*   ------------------------------------------------------------------------------------------------------------------  RADIOLOGY:  Dg Chest 1 View  Result Date: 01/12/2019 CLINICAL DATA:  Weakness and syncope EXAM: CHEST  1 VIEW COMPARISON:  December 29, 2018 FINDINGS: There is no appreciable edema or consolidation. Heart size and pulmonary vascularity are within normal limits. No adenopathy. A small metallic foreign body is noted  slightly to the left of midline at the T2 level, stable. There is degenerative  change in the thoracic spine. IMPRESSION: No edema or consolidation.  Stable cardiac silhouette. Electronically Signed   By: Lowella Grip III M.D.   On: 01/12/2019 15:19   Ct Head Wo Contrast  Result Date: 01/12/2019 CLINICAL DATA:  68 year old male with recurrent syncope and dizziness EXAM: CT HEAD WITHOUT CONTRAST TECHNIQUE: Contiguous axial images were obtained from the base of the skull through the vertex without intravenous contrast. COMPARISON:  MR 12/05/2018, CT 12/05/2018 FINDINGS: Brain: No acute intracranial hemorrhage. No midline shift or mass effect. Gray-white differentiation maintained. Encephalomalacia of the left temporal pole. Focal hypodensity in the right pons and thalamus, unchanged. Patchy hypodensity in the bilateral periventricular white matter. Vascular: Vascular calcifications. Skull: No acute fracture or aggressive bony lesions. Sinuses/Orbits: Unremarkable appearance of the visualized paranasal sinuses. Similar appearance of the left globe. Other: None IMPRESSION: Negative for acute intracranial abnormality. Evidence of chronic microvascular ischemic disease and prior lacunar infarctions, unchanged Electronically Signed   By: Corrie Mckusick D.O.   On: 01/12/2019 15:08   US Carotid Bilateral  Result Date: 01/13/2019 CLINICAL DATA:  Syncopal episode. History of TIA, hypertension, hyperlipidemia and diabetes. EXAM: BILATERAL CAROTID DUPLEX ULTRASOUND TECHNIQUE: Pearline Cables scale imaging, color Doppler and duplex ultrasound were performed of bilateral carotid and vertebral arteries in the neck. COMPARISON:  Carotid Doppler ultrasound-12/04/2018; 09/14/2016 FINDINGS: Criteria: Quantification of carotid stenosis is based on velocity parameters that correlate the residual internal carotid diameter with NASCET-based stenosis levels, using the diameter of the distal internal carotid lumen as the denominator for stenosis measurement. The following velocity measurements were obtained: RIGHT ICA:  149/27 cm/sec CCA: 89/21 cm/sec SYSTOLIC ICA/CCA RATIO:  1.6 ECA: 107 cm/sec LEFT ICA: 82/11 cm/sec CCA: 19/41 cm/sec SYSTOLIC ICA/CCA RATIO:  1.0 ECA: 73 cm/sec RIGHT CAROTID ARTERY: There is a moderate amount of eccentric near circumferential hypoechoic plaque within distal aspect the right common carotid artery (image 4 and 16), extending to involve the right carotid bulb (image 19). There is a moderate amount of near circumferential hypoechoic plaque involving the proximal aspect the right internal carotid artery (image 25), which now results in elevated peak systolic velocities within the proximal aspect the right internal carotid artery. Greatest acquired peak systolic velocity with the proximal right ICA measures 149 centimeters/second (image 27) previously, greatest acquired peak systolic velocity within the right internal carotid artery on the 09/2016 examination measured 119 cm cm/second. RIGHT VERTEBRAL ARTERY:  Antegrade Flow LEFT CAROTID ARTERY: There is a large amount of eccentric echogenic plaque involving the proximal aspect the left internal carotid artery (image 59 and 60), progressed compared to the 09/2016 examination, though not resulting in elevated peak systolic velocities within left internal carotid artery to suggest a hemodynamically significant stenosis. LEFT VERTEBRAL ARTERY:  Antegrade flow IMPRESSION: 1. Moderate amount of right-sided atherosclerotic plaque, progressed compared to the 09/2016 examination, and now results in elevated peak systolic velocities with the right internal carotid artery compatible with the 50-69% luminal narrowing range. Further evaluation with CTA could performed as clinically indicated. 2. Large amount of left-sided atherosclerotic plaque, progressed compared to the 09/2016 examination, though not definitely resulting in a hemodynamically significant stenosis. Electronically Signed   By: Sandi Mariscal M.D.   On: 01/13/2019 10:59   Ct Head Code Stroke Wo  Contrast  Result Date: 01/13/2019 CLINICAL DATA:  Code stroke. Altered level of consciousness. Recent syncopal episodes. EXAM: CT HEAD WITHOUT CONTRAST TECHNIQUE: Contiguous axial images were  obtained from the base of the skull through the vertex without intravenous contrast. COMPARISON:  01/12/2019 FINDINGS: Brain: There is no evidence of acute infarct, intracranial hemorrhage, mass, midline shift, or extra-axial fluid collection. Small chronic infarcts are again seen in the right pons, bilateral thalami, and posterior left lentiform nucleus/external capsule. There is mild cerebral atrophy. Cerebral white matter hypodensities are unchanged and nonspecific but compatible with mild chronic small vessel ischemic disease. Encephalomalacia is again noted in the anterior left temporal lobe. Vascular: Calcified atherosclerosis at the skull base. No hyperdense vessel. Skull: No fracture or focal osseous lesion. Sinuses/Orbits: Paranasal sinuses and mastoid air cells are clear. Right cataract extraction. Left phthisis bulbi. Other: None. ASPECTS [facility] Stroke Program Early CT Score) Not scored with this history. IMPRESSION: 1. No evidence of acute intracranial abnormality. 2. 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 These results were called by telephone at the time of interpretation on 01/13/2019 at 12:30 pm to Dr. Doy Mince, who verbally acknowledged these results. Electronically Signed   By: Logan Bores M.D.   On: 01/13/2019 12:30     ASSESSMENT AND PLAN:   68 year old male patient with history of chronic kidney disease, type 2 diabetes mellitus, chronic diastolic heart failure, hypertension, hypothyroidism initially admitted to hospitalist service for syncope.  -Recurrent syncope Status post cardiology evaluation Recent echocardiogram reviewed  Cardiology did not recommend any acute intervention Could be from orthostatic  hypotension  -Questionable new onset CVA Patient had slurred speech CT head stat did not reveal any acute abnormality except old lacunar infarcts Continue neurochecks Appreciate neurology evaluation TPA deferred Patient had similar episodes in the past  -Type 2 diabetes mellitus Sliding scale coverage with insulin Diabetic diet once he passes swallow study  -CKD stage III Monitor renal function  -DVT prophylaxis subcu Lovenox daily  -Elevated troponin secondary to demand ischemia  -updated the daughter medical condition and treatment plan  All the records are reviewed and case discussed with Care Management/Social Worker. Management plans discussed with the patient, family and they are in agreement.  CODE STATUS: Full code  DVT Prophylaxis: SCDs  TOTAL CRITICAL CARE TIME TAKING CARE OF THIS PATIENT: 52 minutes.   POSSIBLE D/C IN 1 to 2 DAYS, DEPENDING ON CLINICAL CONDITION.  Saundra Shelling M.D on 01/13/2019 at 1:35 PM  Between 7am to 6pm - Pager - [phone]  After 6pm go to [email or web address] - password [email or web address] [facility]  Office  [phone]  CC: Primary care physician; [name], MD  Note: This dictation was prepared with Dragon dictation along with smaller phrase technology. Any transcriptional errors that result from this process are unintentional.

## 2019-01-13 NOTE — Progress Notes (Signed)
CODE STROKE- PHARMACY COMMUNICATION   Time CODE STROKE called/page received: 1203  Time response to CODE STROKE was made (in person or via phone): 1215.   Time Stroke Kit retrieved from [place] (only if needed): N/A   Name of Provider/Nurse contacted: Dr. Doy Mince  Past Medical History:  Diagnosis Date  . Anemia of chronic disease   . Blindness of left eye   . Chronic kidney disease, stage III (moderate) (HCC)   . Diabetes ([place])   . Diarrhea   . Diastolic dysfunction    a. 06/2017 Echo: EF 55-60%, no rwma, Gr2 DD, Asc Ao 3.9cm, nl RV fxn, nl PASP.  Marland Kitchen Hypertension   . Hypothyroidism   . Monoclonal gammopathy of unknown significance (MGUS)    a. prev seen @ [facility].  Lost to f/u - never had BM bx.  . Orthostatic hypotension   . Syncope and collapse    Prior to Admission medications   Medication Sig Start Date End Date Taking? Authorizing Provider  acidophilus (RISAQUAD) CAPS capsule Take 1 capsule by mouth daily.   Yes [provider]  aspirin EC 81 MG tablet Take 81 mg by mouth daily.   Yes [provider]  atorvastatin (LIPITOR) 40 MG tablet Take 40 mg by mouth at bedtime.   Yes [provider]  Cholecalciferol (VITAMIN D-1000 MAX ST) 25 MCG (1000 UT) tablet Take 2,000 Units by mouth daily.    Yes [provider]  citalopram (CELEXA) 20 MG tablet Take 20 mg by mouth daily.   Yes [provider]  clopidogrel (PLAVIX) 75 MG tablet Take 1 tablet (75 mg total) by mouth daily. 12/07/18 12/07/19 Yes Salary, Avel Peace, MD  glipiZIDE (GLUCOTROL) 5 MG tablet Take 2.5 mg by mouth daily before breakfast.  11/26/18  Yes [provider]  hydrALAZINE (APRESOLINE) 10 MG tablet Take 1 tablet (10 mg total) by mouth every 8 (eight) hours. 12/08/18  Yes Mody, Ulice Bold, MD  hydroxypropyl methylcellulose / hypromellose (ISOPTO TEARS / GONIOVISC) 2.5 % ophthalmic solution Place 1 drop into both eyes 3 (three) times daily as needed for dry eyes.   Yes [provider]  hydrOXYzine (ATARAX/VISTARIL) 10 MG tablet Take 1 tablet (10 mg total) by mouth every 8 (eight) hours as needed for anxiety. 12/07/18  Yes Salary, Avel Peace, MD  levothyroxine (SYNTHROID, LEVOTHROID) 100 MCG tablet Take 1 tablet (100 mcg total) by mouth daily before breakfast. 12/08/18  Yes Salary, Montell D, MD  lipase/protease/amylase (CREON) 36000 UNITS CPEP capsule Take 2 capsules (72,000 Units total) by mouth 3 (three) times daily with meals. 06/29/17  Yes Vaughan Basta, MD  loperamide (IMODIUM) 2 MG capsule Take 1 capsule (2 mg total) by mouth every 6 (six) hours as needed for diarrhea or loose stools. 06/29/17  Yes Vaughan Basta, MD    Lorene Samaan L  01/13/2019  12:30 PM

## 2019-01-13 NOTE — Progress Notes (Addendum)
Accompanied on-call ch to pt that was coding. Pt was not able to carry on a conversation. NT shared that pt was having problems speaking while ordering lunch moments earlier. Ch allowed team to provide care for PT to be prepped for CT scan and transferred to ICU. F/u recommended at a later time.    01/13/19 1100  Clinical Encounter Type  Visited With Health care provider  Visit Type Code  Referral From Chaplain  Consult/Referral To Chaplain  Recommendations f/u once pt is stable   Spiritual Encounters  Spiritual Needs Emotional  Stress Factors  Patient Stress Factors Health changes;Major life changes  Family Stress Factors None identified

## 2019-01-13 NOTE — Progress Notes (Addendum)
Late note 1200 Patient received from 2A via bed. Patient appeared to be dazed and was unable to follow directions from Dr. Hilario Quarry as she tested him with NISS. Marland Kitchen After 30 minutes patient was more responsive and speaking clearly. Patient demanded to get OOB for BM. Bedpan tried but unsuccessful. Patient extremely agitated and attempting OOB without help. Dr. Estanislado Pandy in to see patient and instructed patient he was on bedrest and not to get up. After MD left patient still attempting un assisted OOB. BSC placed bedside bed and patient assisted to [facility]. Patient unsteady on feet and ask for walker. Patient verbally abusive and upset about being transferred to ICU.

## 2019-01-13 NOTE — Progress Notes (Signed)
eeg completed ° °

## 2019-01-13 NOTE — Progress Notes (Signed)
   01/13/19 1200  Clinical Encounter Type  Visited With Patient;Health care provider;Other (Comment)  Visit Type Follow-up;Code  Pt was called Code RRT and then Code Stroke. No immediate need for the ch.

## 2019-01-13 NOTE — Progress Notes (Addendum)
Addendum: Code stroke called for new onset altered awareness.  Patient slowly improving.  Has had multiple episodes of syncope/altered awareness in the past.  BP elevated and BS 74.  Initial NIHSS of 11.  No focal findings on neurological examination.  Head CT reviewed and shows no acute changes.  Patient with multiple previous episodes in the past with negative work ups.  Not felt to be a stroke and symptoms rapidly improving therefore patient not administered tPA.   Etiology of symptomatology unclear.    Recommendations: 1. EEG 2. Liberalized BP control  Alexis Goodell, MD Neurology [phone]

## 2019-01-13 NOTE — Progress Notes (Signed)
Remains hateful and belligerent. Very demanding. Bedside swallow evaluation done- passed. Diet ordered. Does not want to keep any monitors on. Even after extensive explanations about his possible stroke vs seizures he is still demanding.

## 2019-01-13 NOTE — Code Documentation (Signed)
Pt on 2A for syncope, per RN states at 1140 she was in the room and helping pt order lunch when pt had sudden onset of slurred speech, code stroke activated,BS 78,  pt taken to CT and then room 20 in the ICU, initial NIHSS of 11, Dr. Doy Mince at bedside, no tPA given, report off to [facility]

## 2019-01-13 NOTE — Consult Note (Signed)
Reason for Consult:Syncope Referring Physician: Pyreddy  CC: Syncope  HPI: Cameron Adams is an 68 y.o. male with multiple medical problems including OH who presents after a syncopal episode. Patient apparently was eating lunch took a pause and then became unresponsive for 1 minute.  Patient reports some right sided weakness at that time that has resolved as well.  Was feeling dizzy prior to this episode.  Has had similar episodes in the past and frequent episodes of weakness both left and right sided.    Past Medical History:  Diagnosis Date  . Anemia of chronic disease   . Blindness of left eye   . Chronic kidney disease, stage III (moderate) (HCC)   . Diabetes ([place])   . Diarrhea   . Diastolic dysfunction    a. 06/2017 Echo: EF 55-60%, no rwma, Gr2 DD, Asc Ao 3.9cm, nl RV fxn, nl PASP.  Marland Kitchen Hypertension   . Hypothyroidism   . Monoclonal gammopathy of unknown significance (MGUS)    a. prev seen @ [facility].  Lost to f/u - never had BM bx.  . Orthostatic hypotension   . Syncope and collapse     Past Surgical History:  Procedure Laterality Date  . 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 PERIPHERAL VASCULAR CATHETERIZATION N/A 09/15/2016   Procedure: Lower Extremity Angiography;  Surgeon: Algernon Huxley, MD;  Location: [facility];  Service: Cardiovascular;  Laterality: N/A;  . THYROID SURGERY      Family History  Problem Relation Age of Onset  . Kidney failure Mother   . Heart disease Father   . Kidney failure Father   . Heart disease Brother     Social History:  reports that he quit smoking about 40 years ago. His smoking use included cigarettes. He has a 2.50 pack-year smoking history. He has never used smokeless tobacco. He reports previous alcohol use. He reports that he does not use drugs.  Allergies  Allergen Reactions  . Gabapentin     Other  reaction(s): Other (See Comments) Trouble sleeping     Medications:  I have reviewed the patient's current medications. Prior to Admission:  Medications Prior to Admission  Medication Sig Dispense Refill Last Dose  . acidophilus (RISAQUAD) CAPS capsule Take 1 capsule by mouth daily.   01/11/2019 at 1100  . aspirin EC 81 MG tablet Take 81 mg by mouth daily.   01/12/2019 at 1100  . atorvastatin (LIPITOR) 40 MG tablet Take 40 mg by mouth at bedtime.   01/11/2019 at 2000  . Cholecalciferol (VITAMIN D-1000 MAX ST) 25 MCG (1000 UT) tablet Take 2,000 Units by mouth daily.    01/12/2019 at 1100  . citalopram (CELEXA) 20 MG tablet Take 20 mg by mouth daily.   01/12/2019 at 1100  . clopidogrel (PLAVIX) 75 MG tablet Take 1 tablet (75 mg total) by mouth daily. 90 tablet 0 01/12/2019 at 1100  . glipiZIDE (GLUCOTROL) 5 MG tablet Take 2.5 mg by mouth daily before breakfast.    01/12/2019 at 1100  . hydrALAZINE (APRESOLINE) 10 MG tablet Take 1 tablet (10 mg total) by mouth every 8 (eight) hours. 90 tablet 0 prn at prn  . hydroxypropyl methylcellulose / hypromellose (ISOPTO TEARS / GONIOVISC) 2.5 % ophthalmic solution Place 1 drop into both eyes 3 (three) times daily as needed for dry eyes.   prn at prn  . hydrOXYzine (  ATARAX/VISTARIL) 10 MG tablet Take 1 tablet (10 mg total) by mouth every 8 (eight) hours as needed for anxiety. 30 tablet 0 prn at prn  . levothyroxine (SYNTHROID, LEVOTHROID) 100 MCG tablet Take 1 tablet (100 mcg total) by mouth daily before breakfast. 30 tablet 0 01/12/2019 at Unknown time  . lipase/protease/amylase (CREON) 36000 UNITS CPEP capsule Take 2 capsules (72,000 Units total) by mouth 3 (three) times daily with meals. 270 capsule 0 01/11/2019 at 2000  . loperamide (IMODIUM) 2 MG capsule Take 1 capsule (2 mg total) by mouth every 6 (six) hours as needed for diarrhea or loose stools. 30 capsule 0 prn at prn   Scheduled: . aspirin EC  81 mg Oral Daily  . atorvastatin  40 mg Oral QHS  . cholecalciferol   2,000 Units Oral Daily  . citalopram  20 mg Oral Daily  . clopidogrel  75 mg Oral Daily  . enoxaparin (LOVENOX) injection  30 mg Subcutaneous Q24H  . glipiZIDE  2.5 mg Oral QAC breakfast  . insulin aspart  0-9 Units Subcutaneous TID WC  . levothyroxine  100 mcg Oral Q0600  . lipase/protease/amylase  72,000 Units Oral TID WC  . sodium chloride flush  3 mL Intravenous Q12H    ROS: History obtained from the patient  General ROS: negative for - chills, fatigue, fever, night sweats, weight gain or weight loss Psychological ROS: negative for - behavioral disorder, hallucinations, memory difficulties, mood swings or suicidal ideation Ophthalmic ROS: negative for - blurry vision, double vision, eye pain or loss of vision ENT ROS: negative for - epistaxis, nasal discharge, oral lesions, sore throat, tinnitus or vertigo Allergy and Immunology ROS: negative for - hives or itchy/watery eyes Hematological and Lymphatic ROS: negative for - bleeding problems, bruising or swollen lymph nodes Endocrine ROS: negative for - galactorrhea, hair pattern changes, polydipsia/polyuria or temperature intolerance Respiratory ROS: negative for - cough, hemoptysis, shortness of breath or wheezing Cardiovascular ROS: negative for - chest pain, dyspnea on exertion, edema or irregular heartbeat Gastrointestinal ROS: negative for - abdominal pain, diarrhea, hematemesis, nausea/vomiting or stool incontinence Genito-Urinary ROS: negative for - dysuria, hematuria, incontinence or urinary frequency/urgency Musculoskeletal ROS: neck pain Neurological ROS: as noted in HPI, tingling in hands and feet for the past year Dermatological ROS: negative for rash and skin lesion changes  Physical Examination: Blood pressure (!) 96/59, pulse 88, temperature 98.2 F (36.8 C), temperature source Oral, resp. rate 16, height 5\' 11"  (1.803 m), weight 70.3 kg, SpO2 93 %.  HEENT-  Normocephalic, no lesions, without obvious abnormality.   Normal external eye and conjunctiva.  Normal TM's bilaterally.  Normal auditory canals and external ears. Normal external nose, mucus membranes and septum.  Normal pharynx. Cardiovascular- S1, S2 normal, pulses palpable throughout   Lungs- chest clear, no wheezing, rales, normal symmetric air entry Abdomen- soft, non-tender; bowel sounds normal; no masses,  no organomegaly Extremities- no edema Lymph-no adenopathy palpable Musculoskeletal-no joint tenderness, deformity or swelling Skin-warm and dry, no hyperpigmentation, vitiligo, or suspicious lesions  Neurological Examination   Mental Status: Alert, oriented, thought content appropriate.  Speech fluent without evidence of aphasia.  Able to follow 3 step commands without difficulty. Cranial Nerves: II: Discs flat bilaterally; left eye blindness, right pupil reactive to light  III,IV, VI: left eye ptosis, extra-ocular motions intact bilaterally V,VII: decrease right NLF, facial light touch sensation normal bilaterally VIII: hearing normal bilaterally IX,X: gag reflex present XI: bilateral shoulder shrug XII: midline tongue extension Motor: Right : Upper extremity  5/5    Left:     Upper extremity   5/5  Lower extremity   5/5     Lower extremity   5/5 Tone and bulk:normal tone throughout; no atrophy noted Sensory: Pinprick and light touch intact throughout, bilaterally Deep Tendon Reflexes: 1+ and symmetric with absent AJ's bilaterally Plantars: Right: mute   Left: mute Cerebellar: Normal finger-to-nose and normal heel-to-shin testing bilaterally Gait: not tested due to safety concerns  Laboratory Studies:   Basic Metabolic Panel: Recent Labs  Lab 01/12/19 1439  NA 139  K 4.7  CL 117*  CO2 18*  GLUCOSE 220*  BUN 31*  CREATININE 2.79*  CALCIUM 8.2*    Liver Function Tests: Recent Labs  Lab 01/12/19 1439  AST 27  ALT 28  ALKPHOS 91  BILITOT 0.6  PROT 6.5  ALBUMIN 3.1*   No results for input(s): LIPASE, AMYLASE  in the last 168 hours. No results for input(s): AMMONIA in the last 168 hours.  CBC: Recent Labs  Lab 01/12/19 1439  WBC 4.1  NEUTROABS 1.8  HGB 10.0*  HCT 31.0*  MCV 81.2  PLT 135*    Cardiac Enzymes: Recent Labs  Lab 01/12/19 1439 01/12/19 1818 01/12/19 2208 01/13/19 0419  TROPONINI 0.12* 0.12* 0.13* 0.16*    BNP: Invalid input(s): POCBNP  CBG: Recent Labs  Lab 01/12/19 1814 01/13/19 0009 01/13/19 0744  GLUCAP 136* 191* 215*    Microbiology: Results for orders placed or performed during the hospital encounter of 12/04/18  Gastrointestinal Panel by PCR , Stool     Status: None   Collection Time: 12/05/18  2:45 PM  Result Value Ref Range Status   Campylobacter species NOT DETECTED NOT DETECTED Final   Plesimonas shigelloides NOT DETECTED NOT DETECTED Final   Salmonella species NOT DETECTED NOT DETECTED Final   Yersinia enterocolitica NOT DETECTED NOT DETECTED Final   Vibrio species NOT DETECTED NOT DETECTED Final   Vibrio cholerae NOT DETECTED NOT DETECTED Final   Enteroaggregative E coli (EAEC) NOT DETECTED NOT DETECTED Final   Enteropathogenic E coli (EPEC) NOT DETECTED NOT DETECTED Final   Enterotoxigenic E coli (ETEC) NOT DETECTED NOT DETECTED Final   Shiga like toxin producing E coli (STEC) NOT DETECTED NOT DETECTED Final   Shigella/Enteroinvasive E coli (EIEC) NOT DETECTED NOT DETECTED Final   Cryptosporidium NOT DETECTED NOT DETECTED Final   Cyclospora cayetanensis NOT DETECTED NOT DETECTED Final   Entamoeba histolytica NOT DETECTED NOT DETECTED Final   Giardia lamblia NOT DETECTED NOT DETECTED Final   Adenovirus F40/41 NOT DETECTED NOT DETECTED Final   Astrovirus NOT DETECTED NOT DETECTED Final   Norovirus GI/GII NOT DETECTED NOT DETECTED Final   Rotavirus A NOT DETECTED NOT DETECTED Final   Sapovirus (I, II, IV, and V) NOT DETECTED NOT DETECTED Final    Comment: Performed at [facility], [address] [place], [place] [address]   MRSA PCR Screening     Status: None   Collection Time: 12/05/18  7:49 PM  Result Value Ref Range Status   MRSA by PCR NEGATIVE NEGATIVE Final    Comment:        The GeneXpert MRSA Assay (FDA approved for NASAL specimens only), is one component of a comprehensive MRSA colonization surveillance program. It is not intended to diagnose MRSA infection nor to guide or monitor treatment for MRSA infections. Performed at [facility], [address] [place], [place] [address]     Coagulation Studies: No results for input(s): LABPROT,  INR in the last 72 hours.  Urinalysis: No results for input(s): COLORURINE, LABSPEC, PHURINE, GLUCOSEU, HGBUR, BILIRUBINUR, KETONESUR, PROTEINUR, UROBILINOGEN, NITRITE, LEUKOCYTESUR in the last 168 hours.  Invalid input(s): APPERANCEUR  Lipid Panel:     Component Value Date/Time   CHOL 102 12/05/2018 0453   TRIG 40 12/05/2018 0453   HDL 48 12/05/2018 0453   CHOLHDL 2.1 12/05/2018 0453   VLDL 8 12/05/2018 0453   LDLCALC 46 12/05/2018 0453    HgbA1C:  Lab Results  Component Value Date   HGBA1C 7.2 (H) 12/05/2018    Urine Drug Screen:      Component Value Date/Time   LABOPIA NONE DETECTED 12/04/2018 0435   COCAINSCRNUR NONE DETECTED 12/04/2018 0435   LABBENZ NONE DETECTED 12/04/2018 0435   AMPHETMU NONE DETECTED 12/04/2018 0435   THCU NONE DETECTED 12/04/2018 0435   LABBARB NONE DETECTED 12/04/2018 0435    Alcohol Level: No results for input(s): ETH in the last 168 hours.  Other results: EKG: sinus rhythm at 64 bpm   Imaging: Dg Chest 1 View  Result Date: 01/12/2019 CLINICAL DATA:  Weakness and syncope EXAM: CHEST  1 VIEW COMPARISON:  December 29, 2018 FINDINGS: There is no appreciable edema or consolidation. Heart size and pulmonary vascularity are within normal limits. No adenopathy. A small metallic foreign body is noted slightly to the left of midline at the T2 level, stable. There is degenerative change in the thoracic  spine. IMPRESSION: No edema or consolidation.  Stable cardiac silhouette. Electronically Signed   By: Lowella Grip III M.D.   On: 01/12/2019 15:19   Ct Head Wo Contrast  Result Date: 01/12/2019 CLINICAL DATA:  68 year old male with recurrent syncope and dizziness EXAM: CT HEAD WITHOUT CONTRAST TECHNIQUE: Contiguous axial images were obtained from the base of the skull through the vertex without intravenous contrast. COMPARISON:  MR 12/05/2018, CT 12/05/2018 FINDINGS: Brain: No acute intracranial hemorrhage. No midline shift or mass effect. Gray-white differentiation maintained. Encephalomalacia of the left temporal pole. Focal hypodensity in the right pons and thalamus, unchanged. Patchy hypodensity in the bilateral periventricular white matter. Vascular: Vascular calcifications. Skull: No acute fracture or aggressive bony lesions. Sinuses/Orbits: Unremarkable appearance of the visualized paranasal sinuses. Similar appearance of the left globe. Other: None IMPRESSION: Negative for acute intracranial abnormality. Evidence of chronic microvascular ischemic disease and prior lacunar infarctions, unchanged Electronically Signed   By: Corrie Mckusick D.O.   On: 01/12/2019 15:08     Assessment/Plan: 68 year old male with multiple vascular risk factors presenting with recurrent syncope and +/- associated neurological symptoms.  Patient is orthostatic.  Likely etiology for his syncopal episodes.  Has had multiple stroke work ups recently with no acute findings on MRIs of the brain and carotid dopplers with no evidence of hemodynamically significant stenosis.  Echocardiogram shows a low EF at 30-35%.  MRI of the brain does show chronic small vessel ischemic changes and evidence of chronic lacunar infarcts which is not surprising with his vascular risk factors and It is likely that when his BP drops he has some focal presentations secondary to focal areas at risk to hypoperfusion due to underlying atherosclerosis.   Patient on statin, ASA and Plavix.  LDL 3/1 was 46.  A1c 7.2.    Recommendations: 1.  Agree with addressing orthostasis 2.  Continue ASA, Plavix and statin 3.  Patient to follow up with neurology on an outpatient basis for chronic management of neck pain and neuropathy.    Alexis Goodell, MD Neurology [phone]  01/13/2019, 10:03 AM

## 2019-01-13 NOTE — Procedures (Signed)
ELECTROENCEPHALOGRAM REPORT   Patient: Cameron Adams       Room #: IC20A-AA EEG No. ID: [id number] Age: 68 y.o.        Sex: male Referring Physician: Pyreddy Report Date:  01/13/2019        Interpreting Physician: Alexis Goodell  History: Cameron Adams is an 68 y.o. male with an episode of altered mental status  Medications:  ASA, Lipitor, Celexa, Plavix, Insulin, Synthroid, Creon  Conditions of Recording:  This is a 21 channel routine scalp EEG performed with bipolar and monopolar montages arranged in accordance to the international 10/20 system of electrode placement. One channel was dedicated to EKG recording.  The patient is in the awake state.  Description:  Artifact is prominent during the recording often obscuring the background rhythm. On the rare occasions when the background rhythm is able to be visualized, the background id slow with delta activity noted.  No epileptiform activity is noted.   Hyperventilation was not performed.  Intermittent photic stimulation was performed but failed to illicit any change in the tracing.   IMPRESSION: Artifact is prominent during the recording.  When visualized the background is slow.  No epileptiform activity is noted.  Although this may represent a diffuse disturbance such as an encephalopathy, can not rule out drowse as well.  Clinical correlation recommended.     Alexis Goodell, MD Neurology [phone] 01/13/2019, 3:31 PM

## 2019-01-14 LAB — GLUCOSE, CAPILLARY
Glucose-Capillary: 68 mg/dL — ABNORMAL LOW (ref 70–99)
Glucose-Capillary: 87 mg/dL (ref 70–99)
Glucose-Capillary: 114 mg/dL — ABNORMAL HIGH (ref 70–99)
Glucose-Capillary: 127 mg/dL — ABNORMAL HIGH (ref 70–99)
Glucose-Capillary: 187 mg/dL — ABNORMAL HIGH (ref 70–99)

## 2019-01-14 MED ORDER — HALOPERIDOL LACTATE 5 MG/ML IJ SOLN
1.0000 mg | INTRAMUSCULAR | Status: DC | PRN
  Administered 2019-01-14: 14:00:00 1 mg via INTRAVENOUS
  Filled 2019-01-14: qty 1

## 2019-01-14 MED ORDER — DIVALPROEX SODIUM ER 500 MG PO TB24
500.0000 mg | ORAL_TABLET | Freq: Two times a day (BID) | ORAL | Status: DC
  Administered 2019-01-14 – 2019-01-16 (×4): 500 mg via ORAL
  Filled 2019-01-14 (×6): qty 1

## 2019-01-14 MED ORDER — VALPROATE SODIUM 500 MG/5ML IV SOLN
1000.0000 mg | Freq: Once | INTRAVENOUS | Status: AC
  Administered 2019-01-14: 17:00:00 1000 mg via INTRAVENOUS
  Filled 2019-01-14: qty 10

## 2019-01-14 MED ORDER — NEPRO/CARBSTEADY PO LIQD
237.0000 mL | Freq: Two times a day (BID) | ORAL | Status: DC
  Administered 2019-01-15 (×2): 237 mL via ORAL

## 2019-01-14 MED ORDER — ADULT MULTIVITAMIN W/MINERALS CH
1.0000 | ORAL_TABLET | Freq: Every day | ORAL | Status: DC
  Administered 2019-01-15 – 2019-01-16 (×2): 1 via ORAL
  Filled 2019-01-14 (×2): qty 1

## 2019-01-14 NOTE — Progress Notes (Addendum)
[facility] NAME: Cameron Adams    MR#:  [id number]  DATE OF BIRTH:  1951-07-14  SUBJECTIVE:  CHIEF COMPLAINT:   Chief Complaint  Patient presents with  . Loss of Consciousness  agitated earlier when I saw him and had sitter at bedside for safety, BP up REVIEW OF SYSTEMS:   ROS  CONSTITUTIONAL: No documented fever. No fatigue, weakness. No weight gain, no weight loss.  EYES: No blurry or double vision.  ENT: No tinnitus. No postnasal drip. No redness of the oropharynx.  RESPIRATORY: No cough, no wheeze, no hemoptysis. No dyspnea.  CARDIOVASCULAR: No chest pain. No orthopnea. No palpitations. No syncope.  GASTROINTESTINAL: No nausea, no vomiting or diarrhea. No abdominal pain. No melena or hematochezia.  GENITOURINARY: No dysuria or hematuria.  ENDOCRINE: No polyuria or nocturia. No heat or cold intolerance.  HEMATOLOGY: No anemia. No bruising. No bleeding.  INTEGUMENTARY: No rashes. No lesions.  MUSCULOSKELETAL: No arthritis. No swelling. No gout.  NEUROLOGIC: No numbness, tingling, or ataxia. No seizure-type activity.  PSYCHIATRIC: No anxiety. No insomnia. No ADD. Agitated DRUG ALLERGIES:   Allergies  Allergen Reactions  . Gabapentin     Other reaction(s): Other (See Comments) Trouble sleeping     VITALS:  Blood pressure (!) 183/78, pulse 73, temperature 98.7 F (37.1 C), temperature source Oral, resp. rate 13, height 5\' 7"  (1.702 m), weight 68.7 kg, SpO2 96 %.  PHYSICAL EXAMINATION:   Physical Exam  GENERAL:  68 y.o.-year-old patient lying in the bed with no acute distress.  EYES: Pupils equal, round, reactive to light and accommodation. No scleral icterus. Extraocular muscles intact.  HEENT: Head atraumatic, normocephalic. Oropharynx and nasopharynx clear.  NECK:  Supple, no jugular venous distention. No thyroid enlargement, no tenderness.  LUNGS: Normal breath sounds bilaterally, no wheezing, rales, rhonchi. No  use of accessory muscles of respiration.  CARDIOVASCULAR: S1, S2 normal. No murmurs, rubs, or gallops.  ABDOMEN: Soft, nontender, nondistended. Bowel sounds present. No organomegaly or mass.  EXTREMITIES: No cyanosis, clubbing or edema b/l.    NEUROLOGIC: Cranial nerves II through XII are intact. No focal Motor or sensory deficits b/l.   PSYCHIATRIC: The patient is alert and oriented x 3.  SKIN: No obvious rash, lesion, or ulcer.   LABORATORY PANEL:   CBC Recent Labs  Lab 01/12/19 1439  WBC 4.1  HGB 10.0*  HCT 31.0*  PLT 135*   ------------------------------------------------------------------------------------------------------------------ Chemistries  Recent Labs  Lab 01/12/19 1439  NA 139  K 4.7  CL 117*  CO2 18*  GLUCOSE 220*  BUN 31*  CREATININE 2.79*  CALCIUM 8.2*  AST 27  ALT 28  ALKPHOS 91  BILITOT 0.6   ------------------------------------------------------------------------------------------------------------------  Cardiac Enzymes Recent Labs  Lab 01/13/19 0419  TROPONINI 0.16*   ------------------------------------------------------------------------------------------------------------------  RADIOLOGY:  US Carotid Bilateral  Result Date: 01/13/2019 CLINICAL DATA:  Syncopal episode. History of TIA, hypertension, hyperlipidemia and diabetes. EXAM: BILATERAL CAROTID DUPLEX ULTRASOUND TECHNIQUE: Pearline Cables scale imaging, color Doppler and duplex ultrasound were performed of bilateral carotid and vertebral arteries in the neck. COMPARISON:  Carotid Doppler ultrasound-12/04/2018; 09/14/2016 FINDINGS: Criteria: Quantification of carotid stenosis is based on velocity parameters that correlate the residual internal carotid diameter with NASCET-based stenosis levels, using the diameter of the distal internal carotid lumen as the denominator for stenosis measurement. The following velocity measurements were obtained: RIGHT ICA: 149/27 cm/sec CCA: 14/97 cm/sec SYSTOLIC  ICA/CCA RATIO:  1.6 ECA: 107 cm/sec LEFT ICA: 82/11 cm/sec CCA:  53/66 cm/sec SYSTOLIC ICA/CCA RATIO:  1.0 ECA: 73 cm/sec RIGHT CAROTID ARTERY: There is a moderate amount of eccentric near circumferential hypoechoic plaque within distal aspect the right common carotid artery (image 4 and 16), extending to involve the right carotid bulb (image 19). There is a moderate amount of near circumferential hypoechoic plaque involving the proximal aspect the right internal carotid artery (image 25), which now results in elevated peak systolic velocities within the proximal aspect the right internal carotid artery. Greatest acquired peak systolic velocity with the proximal right ICA measures 149 centimeters/second (image 27) previously, greatest acquired peak systolic velocity within the right internal carotid artery on the 09/2016 examination measured 119 cm cm/second. RIGHT VERTEBRAL ARTERY:  Antegrade Flow LEFT CAROTID ARTERY: There is a large amount of eccentric echogenic plaque involving the proximal aspect the left internal carotid artery (image 59 and 60), progressed compared to the 09/2016 examination, though not resulting in elevated peak systolic velocities within left internal carotid artery to suggest a hemodynamically significant stenosis. LEFT VERTEBRAL ARTERY:  Antegrade flow IMPRESSION: 1. Moderate amount of right-sided atherosclerotic plaque, progressed compared to the 09/2016 examination, and now results in elevated peak systolic velocities with the right internal carotid artery compatible with the 50-69% luminal narrowing range. Further evaluation with CTA could performed as clinically indicated. 2. Large amount of left-sided atherosclerotic plaque, progressed compared to the 09/2016 examination, though not definitely resulting in a hemodynamically significant stenosis. Electronically Signed   By: Sandi Mariscal M.D.   On: 01/13/2019 10:59   Ct Head Code Stroke Wo Contrast  Result Date: 01/13/2019 CLINICAL  DATA:  Code stroke. Altered level of consciousness. Recent syncopal episodes. EXAM: CT HEAD WITHOUT CONTRAST TECHNIQUE: Contiguous axial images were obtained from the base of the skull through the vertex without intravenous contrast. COMPARISON:  01/12/2019 FINDINGS: Brain: There is no evidence of acute infarct, intracranial hemorrhage, mass, midline shift, or extra-axial fluid collection. Small chronic infarcts are again seen in the right pons, bilateral thalami, and posterior left lentiform nucleus/external capsule. There is mild cerebral atrophy. Cerebral white matter hypodensities are unchanged and nonspecific but compatible with mild chronic small vessel ischemic disease. Encephalomalacia is again noted in the anterior left temporal lobe. Vascular: Calcified atherosclerosis at the skull base. No hyperdense vessel. Skull: No fracture or focal osseous lesion. Sinuses/Orbits: Paranasal sinuses and mastoid air cells are clear. Right cataract extraction. Left phthisis bulbi. Other: None. ASPECTS [facility] Stroke Program Early CT Score) Not scored with this history. IMPRESSION: 1. No evidence of acute intracranial abnormality. 2. 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 These results were called by telephone at the time of interpretation on 01/13/2019 at 12:30 pm to Dr. Doy Mince, who verbally acknowledged these results. Electronically Signed   By: Logan Bores M.D.   On: 01/13/2019 12:30   ASSESSMENT AND PLAN:  68 year old male patient with history of chronic kidney disease, type 2 diabetes mellitus, chronic diastolic heart failure, hypertension, hypothyroidism initially admitted to hospitalist service for syncope.  * Agitation: safety sitter, Haldol prn  -Recurrent syncope Status post cardiology evaluation Recent echocardiogram reviewed  Cardiology did not recommend any acute intervention Could be from orthostatic hypotension  -Questionable  new onset CVA Patient had slurred speech CT head stat did not reveal any acute abnormality except old lacunar infarcts Continue neurochecks Appreciate neurology evaluation - EEG not s/o seizure TPA deferred Patient had similar episodes in the past  -Type 2 diabetes mellitus Sliding scale coverage with insulin  Diabetic diet once he passes swallow study  -CKD stage III Monitor renal function  -DVT prophylaxis subcu Lovenox daily  -Elevated troponin secondary to demand ischemia     All the records are reviewed and case discussed with Care Management/Social Worker. Management plans discussed with the patient, intensivist and they are in agreement.  CODE STATUS: Full code  DVT Prophylaxis: SCDs  TOTAL CRITICAL CARE TIME TAKING CARE OF THIS PATIENT: 15 minutes.   POSSIBLE D/C IN 1 to 2 DAYS, DEPENDING ON CLINICAL CONDITION.  Max Sane M.D on 01/14/2019 at 6:52 PM  Between 7am to 6pm - Pager - [phone]  After 6pm go to [email or web address] - password [email or web address] [place] [facility]  Office  [phone]  CC: Primary care physician; [name], MD  Note: This dictation was prepared with Dragon dictation along with smaller phrase technology. Any transcriptional errors that result from this process are unintentional.

## 2019-01-14 NOTE — Care Plan (Signed)
Mr. Juliane Lack is a 68 year old male who was admitted to [facility] Med[facility] unit on 4/8 after syncopal episode (was unresponsive for approximately 1 minute).  He reported right-sided weakness at that time, which resolved.  He has similar previous episodes, which he has had multiple stroke work-ups with no acute findings on MRI of the brain and carotid Dopplers.  Echocardiogram shows a EF of 30 to 35%.  On 4/9 was noted to have orthostatic hypotension in the morning, then developed slurred speech in the afternoon.  Code stroke was activated and he was moved to stepdown unit.  He was evaluated at bedside by neurology, and by the time of evaluation all deficits were resolved.  CT the head did not reveal any acute abnormalities, just old lacunar infarcts.  TPA was deferred.  Patient is currently hemodynamically stable without any focal deficits.  PCCM is available as needed for any ICU needs.   Darel Hong, AGACNP-BC [facility] Pager: [phone] Cell: [phone]

## 2019-01-14 NOTE — Progress Notes (Addendum)
Initial Nutrition Assessment  DOCUMENTATION CODES:   Not applicable  INTERVENTION:   Nepro Shake po BID, each supplement provides 425 kcal and 19 grams protein  MVI daily   Pt at high refeed risk; recommend monitor K, Mg and P labs daily until stable   NUTRITION DIAGNOSIS:   Inadequate oral intake related to acute illness as evidenced by other (comment)(per chart review).  GOAL:   Patient will meet greater than or equal to 90% of their needs  MONITOR:   PO intake, Supplement acceptance, Labs, Weight trends, Skin, I & O's  REASON FOR ASSESSMENT:   Malnutrition Screening Tool    ASSESSMENT:   68 year old male patient with history of chronic kidney disease (on HD from 06/25/18-10/18/18), CHF, etoh abuse, type 2 diabetes mellitus, hypertension, hypothyroidism admitted for syncope.   RD working remotely.  Per chart review, pt with poor appetite and oral intake pta. Per chart, pt appears to have lost 29lbs(16%) over the past 6 months; this is significant. Pt drinks Glucerna at home. Per chart, pt with chronic diarrhea that GI is following him for. RD will add supplements and MVI to help pt meet his estimated needs.   Pt at risk for developing malnutrition but unable to diagnose at this time  Medications reviewed and include: aspirin, vitamin D, celexa, plavix, lovenox, insulin, creon, NaCl @100ml /hr  Labs reviewed: BUN 31(H), creat 2.79(H)- 4/8 Hgb 10.0(L), Hct 31.0(L)  Unable to complete Nutrition-Focused physical exam at this time.   Diet Order:   Diet Order            Diet Carb Modified Fluid consistency: Thin; Room service appropriate? Yes  Diet effective now              EDUCATION NEEDS:   Not appropriate for education at this time  Skin:  Skin Assessment: Reviewed RN Assessment  Last BM:  4/9- type 6  Height:   Ht Readings from Last 1 Encounters:  01/13/19 5\' 7"  (1.702 m)    Weight:   Wt Readings from Last 1 Encounters:  01/13/19 68.7 kg     Ideal Body Weight:  67 kg  BMI:  Body mass index is 23.72 kg/m.  Estimated Nutritional Needs:   Kcal:  1700-2000kcal/day   Protein:  75-85g/day   Fluid:  >1.7L/day   Koleen Distance MS, RD, LDN Pager #- [phone] Office#- [phone] After Hours Pager: [phone]

## 2019-01-14 NOTE — Progress Notes (Signed)
Transferred to SDU as Code Stroke. CVA ruled out.  ICU/SDU due to agitated delirium.  Combative.  I have ordered haloperidol as needed. Neurology starting VPA   Merton Border, MD PCCM service Mobile [phone] Pager [phone] 01/14/2019 4:57 PM

## 2019-01-14 NOTE — Progress Notes (Signed)
Subjective: Patient stable this morning.  No further episodes of unresponsiveness.  Per patient report he has had some confusion at times.    Objective: Current vital signs: BP (!) 188/87 (BP Location: Right Arm)   Pulse 74   Temp 98.7 F (37.1 C) (Oral)   Resp 15   Ht 5\' 7"  (1.702 m)   Wt 68.7 kg   SpO2 100%   BMI 23.72 kg/m  Vital signs in last 24 hours: Temp:  [97.7 F (36.5 C)-98.8 F (37.1 C)] 98.7 F (37.1 C) (04/10 0800) Pulse Rate:  [66-160] 74 (04/10 0800) Resp:  [12-31] 15 (04/10 0800) BP: (68-210)/(43-140) 188/87 (04/10 0800) SpO2:  [85 %-100 %] 100 % (04/10 0800) Weight:  [68.7 kg] 68.7 kg (04/09 1200)  Intake/Output from previous day: 04/09 0701 - 04/10 0700 In: 1765.8 [I.V.:1765.8] Out: 700 [Urine:700] Intake/Output this shift: No intake/output data recorded. Nutritional status:  Diet Order            Diet Carb Modified Fluid consistency: Thin; Room service appropriate? Yes  Diet effective now              Neurologic Exam: Mental Status: Alert, oriented, thought content appropriate.  Speech fluent without evidence of aphasia.  Able to follow 3 step commands without difficulty. Cranial Nerves: II: Discs flat bilaterally; left eye blindness, right pupil reactive to light  III,IV, VI: left eye ptosis, extra-ocular motions intact bilaterally V,VII: decrease right NLF, facial light touch sensation normal bilaterally VIII: hearing normal bilaterally IX,X: gag reflex present XI: bilateral shoulder shrug XII: midline tongue extension Motor: Able to lift all extremities against gravity with no focal weakness noted Sensory: Pinprick and light touch intact throughout, bilaterally   Lab Results: Basic Metabolic Panel: Recent Labs  Lab 01/12/19 1439  NA 139  K 4.7  CL 117*  CO2 18*  GLUCOSE 220*  BUN 31*  CREATININE 2.79*  CALCIUM 8.2*    Liver Function Tests: Recent Labs  Lab 01/12/19 1439  AST 27  ALT 28  ALKPHOS 91  BILITOT 0.6   PROT 6.5  ALBUMIN 3.1*   No results for input(s): LIPASE, AMYLASE in the last 168 hours. No results for input(s): AMMONIA in the last 168 hours.  CBC: Recent Labs  Lab 01/12/19 1439  WBC 4.1  NEUTROABS 1.8  HGB 10.0*  HCT 31.0*  MCV 81.2  PLT 135*    Cardiac Enzymes: Recent Labs  Lab 01/12/19 1439 01/12/19 1818 01/12/19 2208 01/13/19 0419  TROPONINI 0.12* 0.12* 0.13* 0.16*    Lipid Panel: No results for input(s): CHOL, TRIG, HDL, CHOLHDL, VLDL, LDLCALC in the last 168 hours.  CBG: Recent Labs  Lab 01/13/19 1628 01/13/19 2001 01/13/19 2146 01/14/19 0807 01/14/19 0837  GLUCAP 79 134* 136* 68* 49    Microbiology: Results for orders placed or performed during the hospital encounter of 01/12/19  MRSA PCR Screening     Status: None   Collection Time: 01/13/19  1:21 PM  Result Value Ref Range Status   MRSA by PCR NEGATIVE NEGATIVE Final    Comment:        The GeneXpert MRSA Assay (FDA approved for NASAL specimens only), is one component of a comprehensive MRSA colonization surveillance program. It is not intended to diagnose MRSA infection nor to guide or monitor treatment for MRSA infections. Performed at [facility], [address] [place], [place] [address]     Coagulation Studies: No results for input(s): LABPROT, INR in the last 72 hours.  Imaging: Dg Chest 1 View  Result Date: 01/12/2019 CLINICAL DATA:  Weakness and syncope EXAM: CHEST  1 VIEW COMPARISON:  December 29, 2018 FINDINGS: There is no appreciable edema or consolidation. Heart size and pulmonary vascularity are within normal limits. No adenopathy. A small metallic foreign body is noted slightly to the left of midline at the T2 level, stable. There is degenerative change in the thoracic spine. IMPRESSION: No edema or consolidation.  Stable cardiac silhouette. Electronically Signed   By: Lowella Grip III M.D.   On: 01/12/2019 15:19   Ct Head Wo Contrast  Result Date:  01/12/2019 CLINICAL DATA:  68 year old male with recurrent syncope and dizziness EXAM: CT HEAD WITHOUT CONTRAST TECHNIQUE: Contiguous axial images were obtained from the base of the skull through the vertex without intravenous contrast. COMPARISON:  MR 12/05/2018, CT 12/05/2018 FINDINGS: Brain: No acute intracranial hemorrhage. No midline shift or mass effect. Gray-white differentiation maintained. Encephalomalacia of the left temporal pole. Focal hypodensity in the right pons and thalamus, unchanged. Patchy hypodensity in the bilateral periventricular white matter. Vascular: Vascular calcifications. Skull: No acute fracture or aggressive bony lesions. Sinuses/Orbits: Unremarkable appearance of the visualized paranasal sinuses. Similar appearance of the left globe. Other: None IMPRESSION: Negative for acute intracranial abnormality. Evidence of chronic microvascular ischemic disease and prior lacunar infarctions, unchanged Electronically Signed   By: Corrie Mckusick D.O.   On: 01/12/2019 15:08   US Carotid Bilateral  Result Date: 01/13/2019 CLINICAL DATA:  Syncopal episode. History of TIA, hypertension, hyperlipidemia and diabetes. EXAM: BILATERAL CAROTID DUPLEX ULTRASOUND TECHNIQUE: Pearline Cables scale imaging, color Doppler and duplex ultrasound were performed of bilateral carotid and vertebral arteries in the neck. COMPARISON:  Carotid Doppler ultrasound-12/04/2018; 09/14/2016 FINDINGS: Criteria: Quantification of carotid stenosis is based on velocity parameters that correlate the residual internal carotid diameter with NASCET-based stenosis levels, using the diameter of the distal internal carotid lumen as the denominator for stenosis measurement. The following velocity measurements were obtained: RIGHT ICA: 149/27 cm/sec CCA: 97/02 cm/sec SYSTOLIC ICA/CCA RATIO:  1.6 ECA: 107 cm/sec LEFT ICA: 82/11 cm/sec CCA: 63/78 cm/sec SYSTOLIC ICA/CCA RATIO:  1.0 ECA: 73 cm/sec RIGHT CAROTID ARTERY: There is a moderate amount of  eccentric near circumferential hypoechoic plaque within distal aspect the right common carotid artery (image 4 and 16), extending to involve the right carotid bulb (image 19). There is a moderate amount of near circumferential hypoechoic plaque involving the proximal aspect the right internal carotid artery (image 25), which now results in elevated peak systolic velocities within the proximal aspect the right internal carotid artery. Greatest acquired peak systolic velocity with the proximal right ICA measures 149 centimeters/second (image 27) previously, greatest acquired peak systolic velocity within the right internal carotid artery on the 09/2016 examination measured 119 cm cm/second. RIGHT VERTEBRAL ARTERY:  Antegrade Flow LEFT CAROTID ARTERY: There is a large amount of eccentric echogenic plaque involving the proximal aspect the left internal carotid artery (image 59 and 60), progressed compared to the 09/2016 examination, though not resulting in elevated peak systolic velocities within left internal carotid artery to suggest a hemodynamically significant stenosis. LEFT VERTEBRAL ARTERY:  Antegrade flow IMPRESSION: 1. Moderate amount of right-sided atherosclerotic plaque, progressed compared to the 09/2016 examination, and now results in elevated peak systolic velocities with the right internal carotid artery compatible with the 50-69% luminal narrowing range. Further evaluation with CTA could performed as clinically indicated. 2. Large amount of left-sided atherosclerotic plaque, progressed compared to the 09/2016 examination, though not definitely resulting in a hemodynamically  significant stenosis. Electronically Signed   By: Sandi Mariscal M.D.   On: 01/13/2019 10:59   Ct Head Code Stroke Wo Contrast  Result Date: 01/13/2019 CLINICAL DATA:  Code stroke. Altered level of consciousness. Recent syncopal episodes. EXAM: CT HEAD WITHOUT CONTRAST TECHNIQUE: Contiguous axial images were obtained from the base of  the skull through the vertex without intravenous contrast. COMPARISON:  01/12/2019 FINDINGS: Brain: There is no evidence of acute infarct, intracranial hemorrhage, mass, midline shift, or extra-axial fluid collection. Small chronic infarcts are again seen in the right pons, bilateral thalami, and posterior left lentiform nucleus/external capsule. There is mild cerebral atrophy. Cerebral white matter hypodensities are unchanged and nonspecific but compatible with mild chronic small vessel ischemic disease. Encephalomalacia is again noted in the anterior left temporal lobe. Vascular: Calcified atherosclerosis at the skull base. No hyperdense vessel. Skull: No fracture or focal osseous lesion. Sinuses/Orbits: Paranasal sinuses and mastoid air cells are clear. Right cataract extraction. Left phthisis bulbi. Other: None. ASPECTS [facility] Stroke Program Early CT Score) Not scored with this history. IMPRESSION: 1. No evidence of acute intracranial abnormality. 2. 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 These results were called by telephone at the time of interpretation on 01/13/2019 at 12:30 pm to Dr. Doy Mince, who verbally acknowledged these results. Electronically Signed   By: Logan Bores M.D.   On: 01/13/2019 12:30    Medications:  I have reviewed the patient's current medications. Scheduled: . aspirin EC  81 mg Oral Daily  . atorvastatin  40 mg Oral QHS  . cholecalciferol  2,000 Units Oral Daily  . citalopram  20 mg Oral Daily  . clopidogrel  75 mg Oral Daily  . enoxaparin (LOVENOX) injection  30 mg Subcutaneous Q24H  . insulin aspart  0-9 Units Subcutaneous TID WC  . levothyroxine  100 mcg Oral Q0600  . lipase/protease/amylase  72,000 Units Oral TID WC  . sodium chloride flush  3 mL Intravenous Q12H    Assessment/Plan: No further unresponsive events.  EEG performed on yesterday after event and only significant for slowing.  BP quite  elevated with discontinuation of antihypertensives on yesterday.  Now on hydralazine prn with BP of 188/87.  If episodes continue with no obvious cause will consider a trial of antiepileptic medication.     LOS: 1 day   Alexis Goodell, MD Neurology [phone] 01/14/2019  10:07 AM

## 2019-01-15 LAB — GLUCOSE, CAPILLARY
Glucose-Capillary: 105 mg/dL — ABNORMAL HIGH (ref 70–99)
Glucose-Capillary: 166 mg/dL — ABNORMAL HIGH (ref 70–99)
Glucose-Capillary: 117 mg/dL — ABNORMAL HIGH (ref 70–99)
Glucose-Capillary: 152 mg/dL — ABNORMAL HIGH (ref 70–99)

## 2019-01-15 LAB — VALPROIC ACID LEVEL: Valproic Acid Lvl: 31 ug/mL — ABNORMAL LOW (ref 50.0–100.0)

## 2019-01-15 NOTE — Progress Notes (Signed)
Subjective: Patient became agitated again yesterday.  Required one dose of Haldol.  Depakote started.  Calm and cooperative this morning.    Objective: Current vital signs: BP (!) 182/82   Pulse 70   Temp (!) 97.3 F (36.3 C) (Oral)   Resp 14   Ht 5\' 7"  (1.702 m)   Wt 68.7 kg   SpO2 100%   BMI 23.72 kg/m  Vital signs in last 24 hours: Temp:  [97.3 F (36.3 C)-97.6 F (36.4 C)] 97.3 F (36.3 C) (04/11 0800) Pulse Rate:  [67-83] 70 (04/11 0900) Resp:  [13-31] 14 (04/11 0900) BP: (104-243)/(53-119) 182/82 (04/11 0900) SpO2:  [96 %-100 %] 100 % (04/11 0900)  Intake/Output from previous day: 04/10 0701 - 04/11 0700 In: 1461.6 [I.V.:1401.6; IV Piggyback:60] Out: 1825 [Urine:1825] Intake/Output this shift: Total I/O In: 302.2 [I.V.:302.2] Out: -  Nutritional status:  Diet Order            Diet Carb Modified Fluid consistency: Thin; Room service appropriate? Yes  Diet effective now              Neurologic Exam: Mental Status: Alert, oriented, thought content appropriate. Speech fluent without evidence of aphasia. Able to follow 3 step commands without difficulty. Cranial Nerves: II: Discs flat bilaterally;left eye blindness,rightpupil reactive to light  III,IV, OZ:HYQM eye ptosis, extra-ocular motions intact  V,VII:decrease right NLF, facial light touch sensation normal bilaterally VIII: hearing normal bilaterally IX,X: gag reflex present XI: bilateral shoulder shrug XII: midline tongue extension Motor: Able to lift all extremities against gravity with no focal weakness noted Sensory: Pinprick and light touch intact throughout, bilaterally   Lab Results: Basic Metabolic Panel: Recent Labs  Lab 01/12/19 1439  NA 139  K 4.7  CL 117*  CO2 18*  GLUCOSE 220*  BUN 31*  CREATININE 2.79*  CALCIUM 8.2*    Liver Function Tests: Recent Labs  Lab 01/12/19 1439  AST 27  ALT 28  ALKPHOS 91  BILITOT 0.6  PROT 6.5  ALBUMIN 3.1*   No results for  input(s): LIPASE, AMYLASE in the last 168 hours. No results for input(s): AMMONIA in the last 168 hours.  CBC: Recent Labs  Lab 01/12/19 1439  WBC 4.1  NEUTROABS 1.8  HGB 10.0*  HCT 31.0*  MCV 81.2  PLT 135*    Cardiac Enzymes: Recent Labs  Lab 01/12/19 1439 01/12/19 1818 01/12/19 2208 01/13/19 0419  TROPONINI 0.12* 0.12* 0.13* 0.16*    Lipid Panel: No results for input(s): CHOL, TRIG, HDL, CHOLHDL, VLDL, LDLCALC in the last 168 hours.  CBG: Recent Labs  Lab 01/14/19 0837 01/14/19 1310 01/14/19 1621 01/14/19 2127 01/15/19 0745  GLUCAP 87 114* 127* 187* 105*    Microbiology: Results for orders placed or performed during the hospital encounter of 01/12/19  MRSA PCR Screening     Status: None   Collection Time: 01/13/19  1:21 PM  Result Value Ref Range Status   MRSA by PCR NEGATIVE NEGATIVE Final    Comment:        The GeneXpert MRSA Assay (FDA approved for NASAL specimens only), is one component of a comprehensive MRSA colonization surveillance program. It is not intended to diagnose MRSA infection nor to guide or monitor treatment for MRSA infections. Performed at [facility], [address] [place], [place] [address]     Coagulation Studies: No results for input(s): LABPROT, INR in the last 72 hours.  Imaging: US Carotid Bilateral  Result Date: 01/13/2019 CLINICAL DATA:  Syncopal  episode. History of TIA, hypertension, hyperlipidemia and diabetes. EXAM: BILATERAL CAROTID DUPLEX ULTRASOUND TECHNIQUE: Pearline Cables scale imaging, color Doppler and duplex ultrasound were performed of bilateral carotid and vertebral arteries in the neck. COMPARISON:  Carotid Doppler ultrasound-12/04/2018; 09/14/2016 FINDINGS: Criteria: Quantification of carotid stenosis is based on velocity parameters that correlate the residual internal carotid diameter with NASCET-based stenosis levels, using the diameter of the distal internal carotid lumen as the denominator for  stenosis measurement. The following velocity measurements were obtained: RIGHT ICA: 149/27 cm/sec CCA: 16/10 cm/sec SYSTOLIC ICA/CCA RATIO:  1.6 ECA: 107 cm/sec LEFT ICA: 82/11 cm/sec CCA: 96/04 cm/sec SYSTOLIC ICA/CCA RATIO:  1.0 ECA: 73 cm/sec RIGHT CAROTID ARTERY: There is a moderate amount of eccentric near circumferential hypoechoic plaque within distal aspect the right common carotid artery (image 4 and 16), extending to involve the right carotid bulb (image 19). There is a moderate amount of near circumferential hypoechoic plaque involving the proximal aspect the right internal carotid artery (image 25), which now results in elevated peak systolic velocities within the proximal aspect the right internal carotid artery. Greatest acquired peak systolic velocity with the proximal right ICA measures 149 centimeters/second (image 27) previously, greatest acquired peak systolic velocity within the right internal carotid artery on the 09/2016 examination measured 119 cm cm/second. RIGHT VERTEBRAL ARTERY:  Antegrade Flow LEFT CAROTID ARTERY: There is a large amount of eccentric echogenic plaque involving the proximal aspect the left internal carotid artery (image 59 and 60), progressed compared to the 09/2016 examination, though not resulting in elevated peak systolic velocities within left internal carotid artery to suggest a hemodynamically significant stenosis. LEFT VERTEBRAL ARTERY:  Antegrade flow IMPRESSION: 1. Moderate amount of right-sided atherosclerotic plaque, progressed compared to the 09/2016 examination, and now results in elevated peak systolic velocities with the right internal carotid artery compatible with the 50-69% luminal narrowing range. Further evaluation with CTA could performed as clinically indicated. 2. Large amount of left-sided atherosclerotic plaque, progressed compared to the 09/2016 examination, though not definitely resulting in a hemodynamically significant stenosis. Electronically  Signed   By: Sandi Mariscal M.D.   On: 01/13/2019 10:59   Ct Head Code Stroke Wo Contrast  Result Date: 01/13/2019 CLINICAL DATA:  Code stroke. Altered level of consciousness. Recent syncopal episodes. EXAM: CT HEAD WITHOUT CONTRAST TECHNIQUE: Contiguous axial images were obtained from the base of the skull through the vertex without intravenous contrast. COMPARISON:  01/12/2019 FINDINGS: Brain: There is no evidence of acute infarct, intracranial hemorrhage, mass, midline shift, or extra-axial fluid collection. Small chronic infarcts are again seen in the right pons, bilateral thalami, and posterior left lentiform nucleus/external capsule. There is mild cerebral atrophy. Cerebral white matter hypodensities are unchanged and nonspecific but compatible with mild chronic small vessel ischemic disease. Encephalomalacia is again noted in the anterior left temporal lobe. Vascular: Calcified atherosclerosis at the skull base. No hyperdense vessel. Skull: No fracture or focal osseous lesion. Sinuses/Orbits: Paranasal sinuses and mastoid air cells are clear. Right cataract extraction. Left phthisis bulbi. Other: None. ASPECTS [facility] Stroke Program Early CT Score) Not scored with this history. IMPRESSION: 1. No evidence of acute intracranial abnormality. 2. 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 These results were called by telephone at the time of interpretation on 01/13/2019 at 12:30 pm to Dr. Doy Mince, who verbally acknowledged these results. Electronically Signed   By: Logan Bores M.D.   On: 01/13/2019 12:30    Medications:  I have reviewed the patient's current  medications. Scheduled: . aspirin EC  81 mg Oral Daily  . atorvastatin  40 mg Oral QHS  . cholecalciferol  2,000 Units Oral Daily  . citalopram  20 mg Oral Daily  . clopidogrel  75 mg Oral Daily  . divalproex  500 mg Oral BID  . enoxaparin (LOVENOX) injection  30 mg Subcutaneous Q24H   . feeding supplement (NEPRO CARB STEADY)  237 mL Oral BID BM  . insulin aspart  0-9 Units Subcutaneous TID WC  . levothyroxine  100 mcg Oral Q0600  . lipase/protease/amylase  72,000 Units Oral TID WC  . multivitamin with minerals  1 tablet Oral Daily  . sodium chloride flush  3 mL Intravenous Q12H    Assessment/Plan: Patient has had no further episodes of unresponsiveness but does have periods of agitation.  Required Haldol X 1 on yesterday.  Depakote started.  This may provide not only some benefit in behavior but will provide an AED trial as well.  Level this morning of 31.  BP controlled.    Recommendations: 1.  Continue Depakote at 500mg  BID 2.  Once patient considered stable would recheck orthostatics and attempt to get patient out of bed.     LOS: 2 days   Alexis Goodell, MD Neurology [phone] 01/15/2019  9:28 AM

## 2019-01-15 NOTE — Progress Notes (Signed)
[place] [facility] [place] NAME: Cameron Adams    MR#:  [id number]  DATE OF BIRTH:  [birth year]  SUBJECTIVE:  CHIEF COMPLAINT:   Chief Complaint  Patient presents with  . Loss of Consciousness  overall calm today REVIEW OF SYSTEMS:   ROS  CONSTITUTIONAL: No documented fever. No fatigue, weakness. No weight gain, no weight loss.  EYES: No blurry or double vision.  ENT: No tinnitus. No postnasal drip. No redness of the oropharynx.  RESPIRATORY: No cough, no wheeze, no hemoptysis. No dyspnea.  CARDIOVASCULAR: No chest pain. No orthopnea. No palpitations. No syncope.  GASTROINTESTINAL: No nausea, no vomiting or diarrhea. No abdominal pain. No melena or hematochezia.  GENITOURINARY: No dysuria or hematuria.  ENDOCRINE: No polyuria or nocturia. No heat or cold intolerance.  HEMATOLOGY: No anemia. No bruising. No bleeding.  INTEGUMENTARY: No rashes. No lesions.  MUSCULOSKELETAL: No arthritis. No swelling. No gout.  NEUROLOGIC: No numbness, tingling, or ataxia. No seizure-type activity.  PSYCHIATRIC: No anxiety. No insomnia. No ADD. Agitated DRUG ALLERGIES:   Allergies  Allergen Reactions  . Gabapentin     Other reaction(s): Other (See Comments) Trouble sleeping    VITALS:  Blood pressure (!) 134/51, pulse 76, temperature (!) 97.3 F (36.3 C), temperature source Oral, resp. rate 16, height 5\' 7"  (1.702 m), weight 68.7 kg, SpO2 98 %.  PHYSICAL EXAMINATION:   Physical Exam  GENERAL:  68 y.o.-year-old patient lying in the bed with no acute distress.  EYES: Pupils equal, round, reactive to light and accommodation. No scleral icterus. Extraocular muscles intact.  HEENT: Head atraumatic, normocephalic. Oropharynx and nasopharynx clear.  NECK:  Supple, no jugular venous distention. No thyroid enlargement, no tenderness.  LUNGS: Normal breath sounds bilaterally, no wheezing, rales, rhonchi. No use of accessory muscles of respiration.   CARDIOVASCULAR: S1, S2 normal. No murmurs, rubs, or gallops.  ABDOMEN: Soft, nontender, nondistended. Bowel sounds present. No organomegaly or mass.  EXTREMITIES: No cyanosis, clubbing or edema b/l.    NEUROLOGIC: Cranial nerves II through XII are intact. No focal Motor or sensory deficits b/l.   PSYCHIATRIC: The patient is alert and oriented x 3.  SKIN: No obvious rash, lesion, or ulcer.   LABORATORY PANEL:   CBC Recent Labs  Lab 01/12/19 1439  WBC 4.1  HGB 10.0*  HCT 31.0*  PLT 135*   ------------------------------------------------------------------------------------------------------------------ Chemistries  Recent Labs  Lab 01/12/19 1439  NA 139  K 4.7  CL 117*  CO2 18*  GLUCOSE 220*  BUN 31*  CREATININE 2.79*  CALCIUM 8.2*  AST 27  ALT 28  ALKPHOS 91  BILITOT 0.6   ------------------------------------------------------------------------------------------------------------------  Cardiac Enzymes Recent Labs  Lab 01/13/19 0419  TROPONINI 0.16*   ------------------------------------------------------------------------------------------------------------------  RADIOLOGY:  Ct Head Code Stroke Wo Contrast  Result Date: 01/13/2019 CLINICAL DATA:  Code stroke. Altered level of consciousness. Recent syncopal episodes. EXAM: CT HEAD WITHOUT CONTRAST TECHNIQUE: Contiguous axial images were obtained from the base of the skull through the vertex without intravenous contrast. COMPARISON:  01/12/2019 FINDINGS: Brain: There is no evidence of acute infarct, intracranial hemorrhage, mass, midline shift, or extra-axial fluid collection. Small chronic infarcts are again seen in the right pons, bilateral thalami, and posterior left lentiform nucleus/external capsule. There is mild cerebral atrophy. Cerebral white matter hypodensities are unchanged and nonspecific but compatible with mild chronic small vessel ischemic disease. Encephalomalacia is again noted in the anterior left  temporal lobe. Vascular: Calcified atherosclerosis at the skull base. No hyperdense  vessel. Skull: No fracture or focal osseous lesion. Sinuses/Orbits: Paranasal sinuses and mastoid air cells are clear. Right cataract extraction. Left phthisis bulbi. Other: None. ASPECTS [facility] Stroke Program Early CT Score) Not scored with this history. IMPRESSION: 1. No evidence of acute intracranial abnormality. 2. 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 These results were called by telephone at the time of interpretation on 01/13/2019 at 12:30 pm to Dr. Doy Mince, who verbally acknowledged these results. Electronically Signed   By: Logan Bores M.D.   On: 01/13/2019 12:30   ASSESSMENT AND PLAN:  68 year old male patient with history of chronic kidney disease, type 2 diabetes mellitus, chronic diastolic heart failure, hypertension, hypothyroidism initially admitted to hospitalist service for syncope.  * Agitation: He is much more calm.  Did receive 1 dose of Haldol yesterday - Continue Depakote at 500mg  BID per neurology  * Recurrent syncope - Carotid ultrasound 11/2018 revealed less than 50% stenosis bilaterally. - Left ventricular dysfunction with LVEF 30-35% per recent echocardiogram 12/05/2018. - Status post cardiology evaluation -no acute intervention Could be from orthostatic hypotension  * CVA -ruled out - CT head stat did not reveal any acute abnormality except old lacunar infarcts - Appreciate neurology evaluation - EEG not s/o seizure - Patient had similar episodes in the past  -Type 2 diabetes mellitus Sliding scale coverage with insulin Diabetic diet   -CKD stage III Monitor renal function  -DVT prophylaxis subcu Lovenox daily  -Elevated troponin secondary to demand ischemia     All the records are reviewed and case discussed with Care Management/Social Worker. Management plans discussed with the patient, intensivist and  they are in agreement.  CODE STATUS: Full code  DVT Prophylaxis: SCDs  TOTAL CRITICAL CARE TIME TAKING CARE OF THIS PATIENT: 15 minutes.   POSSIBLE D/C IN 1 to 2 DAYS, DEPENDING ON CLINICAL CONDITION.  Max Sane M.D on 01/15/2019 at 11:33 AM  Between 7am to 6pm - Pager - [phone]  After 6pm go to [email or web address] - password [email or web address] [facility]  Office  [phone]  CC: Primary care physician; [name], MD  Note: This dictation was prepared with Dragon dictation along with smaller phrase technology. Any transcriptional errors that result from this process are unintentional.

## 2019-01-16 LAB — CBC
HCT: 31 % — ABNORMAL LOW (ref 39.0–52.0)
Hemoglobin: 10 g/dL — ABNORMAL LOW (ref 13.0–17.0)
MCH: 26.2 pg (ref 26.0–34.0)
MCHC: 32.3 g/dL (ref 30.0–36.0)
MCV: 81.2 fL (ref 80.0–100.0)
Platelets: 130 10*3/uL — ABNORMAL LOW (ref 150–400)
RBC: 3.82 MIL/uL — ABNORMAL LOW (ref 4.22–5.81)
RDW: 19.4 % — ABNORMAL HIGH (ref 11.5–15.5)
WBC: 5.3 10*3/uL (ref 4.0–10.5)
nRBC: 0 % (ref 0.0–0.2)

## 2019-01-16 LAB — BASIC METABOLIC PANEL
Anion gap: 6 (ref 5–15)
BUN: 30 mg/dL — ABNORMAL HIGH (ref 8–23)
CO2: 19 mmol/L — ABNORMAL LOW (ref 22–32)
Calcium: 8.2 mg/dL — ABNORMAL LOW (ref 8.9–10.3)
Chloride: 116 mmol/L — ABNORMAL HIGH (ref 98–111)
Creatinine, Ser: 2.36 mg/dL — ABNORMAL HIGH (ref 0.61–1.24)
GFR calc Af Amer: 32 mL/min — ABNORMAL LOW (ref >60)
GFR calc non Af Amer: 27 mL/min — ABNORMAL LOW (ref >60)
Glucose, Bld: 188 mg/dL — ABNORMAL HIGH (ref 70–99)
Potassium: 5.8 mmol/L — ABNORMAL HIGH (ref 3.5–5.1)
Sodium: 141 mmol/L (ref 135–145)

## 2019-01-16 LAB — GLUCOSE, CAPILLARY: Glucose-Capillary: 182 mg/dL — ABNORMAL HIGH (ref 70–99)

## 2019-01-16 MED ORDER — DIVALPROEX SODIUM ER 500 MG PO TB24: 500.0000 mg | ORAL_TABLET | Freq: Two times a day (BID) | ORAL | 0 refills | Status: AC

## 2019-01-16 NOTE — TOC Transition Note (Signed)
Transition of Care [facility]) - CM/SW Discharge Note   Patient Details  Name: ELDRA WORD MRN: [id number] Date of Birth: 1951-03-29  Transition of Care [facility]) CM/SW Contact:  Latanya Maudlin, RN Phone Number: 01/16/2019, 9:48 AM   Clinical Narrative:   Patient to be discharged per MD order. Orders in place for home health services. Patient has been recently opened with [facility] and prefers to continue with them. Notified [place] of resumption of care orders. Patient has rolling walker, cane and wheelchair in home. Daughter to provide discharge transportation.     Final next level of care: Home w Home Health Services Barriers to Discharge: No Barriers Identified   Patient Goals and CMS Choice   CMS Medicare.gov Compare Post Acute Care list provided to:: Patient Choice offered to / list presented to : Patient  Discharge Placement                       Discharge Plan and Services                    HH Arranged: RN, PT, Nurse's Aide [place] Agency: [facility]   Social Determinants of Health (SDOH) Interventions     Readmission Risk Interventions Readmission Risk Prevention Plan 01/16/2019  Transportation Screening Complete  PCP or Specialist Appt within 3-5 Days Complete  HRI or [place] Complete  Social Work Consult for [place] Planning/Counseling Complete  Palliative Care Screening Not Applicable  Medication Review [employer]) Complete  Some recent data might be hidden

## 2019-01-16 NOTE — Discharge Instructions (Signed)
Near-Syncope Near-syncope is when you suddenly get weak or dizzy, or you feel like you might pass out (faint). This is due to a lack of blood flow to the brain. During an episode of near-syncope, you may:  Feel dizzy or light-headed.  Feel sick to your stomach (nauseous).  See all white or all black.  Have cold, clammy skin. This condition is caused by a sudden decrease in blood flow to the brain. This decrease can result from various causes, but most of those causes are not dangerous. However, near-syncope may be a sign of a serious medical problem, so it is important to seek medical care. Follow these instructions at home: Pay attention to any changes in your symptoms. Take these actions to help with your condition:  Have someone stay with you until you feel stable.  Talk with your doctor about your symptoms. You may need to have testing to understand the cause of your near-syncope.  Do not drive, use machinery, or play sports until your doctor says it is okay.  Keep all follow-up visits as told by your doctor. This is important.  If you start to feel like you might pass out, lie down right away and raise (elevate) your feet above the level of your heart. Breathe deeply and steadily. Wait until all of the symptoms are gone.  Drink enough fluid to keep your pee (urine) pale yellow. Medicines  If you are taking blood pressure or heart medicine, get up slowly and spend many minutes getting ready to sit and then stand. This can help with dizziness.  Take over-the-counter and prescription medicines only as told by your doctor. Get help right away if you:  Have a seizure.  Have pain in your: ? Chest. ? Tummy, ? Back.  Faint.  Have a bad headache.  Are bleeding from your mouth or butt.  Have black or tarry poop (stool).  Have a very fast or uneven heartbeat (palpitations).  Are confused.  Have trouble walking.  Are very weak.  Have trouble seeing. These symptoms may  represent a serious problem that is an emergency. Do not wait to see if your symptoms will go away. Get medical help right away. Call your local emergency services (911 in the [place] Do not drive yourself to the hospital. Summary  Near-syncope is when you suddenly get weak or dizzy, or you feel like you might pass out.  This condition is caused by a lack of blood flow to the brain.  Near-syncope may be a sign of a serious medical problem, so it is important to seek medical care. This information is not intended to replace advice given to you by your health care provider. Make sure you discuss any questions you have with your health care provider. Document Released: 03/10/2008 Document Revised: 05/18/2018 Document Reviewed: 06/06/2015 Elsevier Interactive Patient Education  2019 [employer].

## 2019-01-16 NOTE — Progress Notes (Signed)
Subjective: Patient reports no new episodes of unresponsiveness.  No further agitation.  Objective: Current vital signs: BP (!) 161/77 (BP Location: Right Arm)   Pulse 75   Temp 98.4 F (36.9 C) (Oral)   Resp 16   Ht 5\' 7"  (1.702 m)   Wt 68.7 kg   SpO2 98%   BMI 23.72 kg/m  Vital signs in last 24 hours: Temp:  [97.5 F (36.4 C)-98.5 F (36.9 C)] 98.4 F (36.9 C) (04/12 0515) Pulse Rate:  [70-78] 75 (04/12 0515) Resp:  [16-18] 16 (04/12 0515) BP: (121-187)/(47-85) 161/77 (04/12 0515) SpO2:  [96 %-99 %] 98 % (04/12 0515)  Intake/Output from previous day: 04/11 0701 - 04/12 0700 In: 608.9 [P.O.:120; I.V.:488.9] Out: 750 [Urine:750] Intake/Output this shift: Total I/O In: 240 [P.O.:240] Out: 325 [Urine:325] Nutritional status:  Diet Order            Diet - low sodium heart healthy        Diet Carb Modified Fluid consistency: Thin; Room service appropriate? Yes  Diet effective now              Neurologic Exam: Mental Status: Alert, oriented, thought content appropriate. Speech fluent without evidence of aphasia. Able to follow 3 step commands without difficulty. Cranial Nerves: II: Discs flat bilaterally;left eye blindness,rightpupil reactive to light  III,IV, HM:CNOB eye ptosis, extra-ocular motions intact  V,VII:decrease right NLF, facial light touch sensation normal bilaterally VIII: hearing normal bilaterally IX,X: gag reflex present XI: bilateral shoulder shrug XII: midline tongue extension Motor: Able to lift all extremities against gravity with no focal weakness noted Sensory: Pinprick and light touch intact throughout, bilaterally  Lab Results: Basic Metabolic Panel: Recent Labs  Lab 01/12/19 1439 01/16/19 0338  NA 139 141  K 4.7 5.8*  CL 117* 116*  CO2 18* 19*  GLUCOSE 220* 188*  BUN 31* 30*  CREATININE 2.79* 2.36*  CALCIUM 8.2* 8.2*    Liver Function Tests: Recent Labs  Lab 01/12/19 1439  AST 27  ALT 28  ALKPHOS 91   BILITOT 0.6  PROT 6.5  ALBUMIN 3.1*   No results for input(s): LIPASE, AMYLASE in the last 168 hours. No results for input(s): AMMONIA in the last 168 hours.  CBC: Recent Labs  Lab 01/12/19 1439 01/16/19 0338  WBC 4.1 5.3  NEUTROABS 1.8  --   HGB 10.0* 10.0*  HCT 31.0* 31.0*  MCV 81.2 81.2  PLT 135* 130*    Cardiac Enzymes: Recent Labs  Lab 01/12/19 1439 01/12/19 1818 01/12/19 2208 01/13/19 0419  TROPONINI 0.12* 0.12* 0.13* 0.16*    Lipid Panel: No results for input(s): CHOL, TRIG, HDL, CHOLHDL, VLDL, LDLCALC in the last 168 hours.  CBG: Recent Labs  Lab 01/15/19 0745 01/15/19 1145 01/15/19 1642 01/15/19 2147 01/16/19 0752  GLUCAP 105* 166* 117* 152* 182*    Microbiology: Results for orders placed or performed during the hospital encounter of 01/12/19  MRSA PCR Screening     Status: None   Collection Time: 01/13/19  1:21 PM  Result Value Ref Range Status   MRSA by PCR NEGATIVE NEGATIVE Final    Comment:        The GeneXpert MRSA Assay (FDA approved for NASAL specimens only), is one component of a comprehensive MRSA colonization surveillance program. It is not intended to diagnose MRSA infection nor to guide or monitor treatment for MRSA infections. Performed at [facility] [address], [place], [place] [address]     Coagulation Studies: No  results for input(s): LABPROT, INR in the last 72 hours.  Imaging: No results found.  Medications:  I have reviewed the patient's current medications. Scheduled: . aspirin EC  81 mg Oral Daily  . atorvastatin  40 mg Oral QHS  . cholecalciferol  2,000 Units Oral Daily  . citalopram  20 mg Oral Daily  . clopidogrel  75 mg Oral Daily  . divalproex  500 mg Oral BID  . enoxaparin (LOVENOX) injection  30 mg Subcutaneous Q24H  . feeding supplement (NEPRO CARB STEADY)  237 mL Oral BID BM  . insulin aspart  0-9 Units Subcutaneous TID WC  . levothyroxine  100 mcg Oral Q0600  .  lipase/protease/amylase  72,000 Units Oral TID WC  . multivitamin with minerals  1 tablet Oral Daily  . sodium chloride flush  3 mL Intravenous Q12H    Assessment/Plan: No further episodes of unresponsiveness.  No longer agitated.  Tolerating Depakote at 500mg  BID.    Recommendations: 1.  Agree with continuing attempts to manage orthostasis 2. Continue Depakote at 500mg  BID 3. Patient to follow up with neurology on an outpatient basis.     LOS: 3 days   Alexis Goodell, MD Neurology [phone] 01/16/2019  9:51 AM

## 2019-01-17 NOTE — Discharge Summary (Signed)
[facility] NAME: Cameron Adams    MR#:  [id number]  DATE OF BIRTH:  18-Nov-1950  DATE OF ADMISSION:  01/12/2019   ADMITTING PHYSICIAN: Dustin Flock, MD  DATE OF DISCHARGE: 01/16/2019 12:04 PM  PRIMARY CARE PHYSICIAN: [name], MD   ADMISSION DIAGNOSIS:  Paresthesia [R20.2] Syncope [R55] Syncope, unspecified syncope type [R55] DISCHARGE DIAGNOSIS:  Active Problems:   Orthostatic hypotension   Syncope and collapse  SECONDARY DIAGNOSIS:   Past Medical History:  Diagnosis Date  . Anemia of chronic disease   . Blindness of left eye   . Chronic kidney disease, stage III (moderate) (HCC)   . Diabetes ([place])   . Diarrhea   . Diastolic dysfunction    a. 06/2017 Echo: EF 55-60%, no rwma, Gr2 DD, Asc Ao 3.9cm, nl RV fxn, nl PASP.  Marland Kitchen Hypertension   . Hypothyroidism   . Monoclonal gammopathy of unknown significance (MGUS)    a. prev seen @ [facility].  Lost to f/u - never had BM bx.  . Orthostatic hypotension   . Syncope and collapse    HOSPITAL COURSE:  68 year old male patient with history of chronic kidney disease, type 2 diabetes mellitus, chronic diastolic heart failure, hypertension, hypothyroidism initially admitted to hospitalist service for syncope.  * Agitation: resolved. Continue Depakote at 500mg  BID at D/C and outpt f/up with Neuro  * Recurrent syncope - Carotid ultrasound 11/2018 revealed less than 50% stenosis bilaterally. - Left ventricular dysfunction with LVEF 30-35% per recent echocardiogram 12/05/2018. - Status post cardiology evaluation -no acute intervention Could be from orthostatic hypotension. outpt f/up with PCP and Cardio to manage this.  * CVA -ruled out - CT head stat did not reveal any acute abnormality except old lacunar infarcts - EEG not s/o seizure - Patient had similar episodes in the past  -Type 2 diabetes mellitus: resume home meds at DC  -CKD stage III: at baseline   -Elevated troponin secondary to demand ischemia  * Chronic systolic and diastolic CHF DISCHARGE CONDITIONS:  stable CONSULTS OBTAINED:  Treatment Team:  Isaias Cowman, MD Catarina Hartshorn, MD DRUG ALLERGIES:   Allergies  Allergen Reactions  . Gabapentin     Other reaction(s): Other (See Comments) Trouble sleeping    DISCHARGE MEDICATIONS:   Allergies as of 01/16/2019      Reactions   Gabapentin    Other reaction(s): Other (See Comments) Trouble sleeping       Medication List    STOP taking these medications   hydrALAZINE 10 MG tablet Commonly known as:  APRESOLINE     TAKE these medications   acidophilus Caps capsule Take 1 capsule by mouth daily.   aspirin EC 81 MG tablet Take 81 mg by mouth daily.   atorvastatin 40 MG tablet Commonly known as:  LIPITOR Take 40 mg by mouth at bedtime.   citalopram 20 MG tablet Commonly known as:  CELEXA Take 20 mg by mouth daily.   clopidogrel 75 MG tablet Commonly known as:  Plavix Take 1 tablet (75 mg total) by mouth daily.   divalproex 500 MG 24 hr tablet Commonly known as:  DEPAKOTE ER Take 1 tablet (500 mg total) by mouth 2 (two) times daily.   glipiZIDE 5 MG tablet Commonly known as:  GLUCOTROL Take 2.5 mg by mouth daily before breakfast.   hydroxypropyl methylcellulose / hypromellose 2.5 % ophthalmic solution Commonly known as:  ISOPTO TEARS / GONIOVISC Place 1 drop into both eyes  3 (three) times daily as needed for dry eyes.   hydrOXYzine 10 MG tablet Commonly known as:  ATARAX/VISTARIL Take 1 tablet (10 mg total) by mouth every 8 (eight) hours as needed for anxiety.   levothyroxine 100 MCG tablet Commonly known as:  SYNTHROID, LEVOTHROID Take 1 tablet (100 mcg total) by mouth daily before breakfast.   lipase/protease/amylase 36000 UNITS Cpep capsule Commonly known as:  CREON Take 2 capsules (72,000 Units total) by mouth 3 (three) times daily with meals.   loperamide 2 MG capsule Commonly  known as:  IMODIUM Take 1 capsule (2 mg total) by mouth every 6 (six) hours as needed for diarrhea or loose stools.   Vitamin D-1000 Max St 25 MCG (1000 UT) tablet Generic drug:  Cholecalciferol Take 2,000 Units by mouth daily.        DISCHARGE INSTRUCTIONS:   DIET:  Cardiac diet DISCHARGE CONDITION:  Stable ACTIVITY:  Activity as tolerated OXYGEN:  Home Oxygen: No.  Oxygen Delivery: room air DISCHARGE LOCATION:  home   If you experience worsening of your admission symptoms, develop shortness of breath, life threatening emergency, suicidal or homicidal thoughts you must seek medical attention immediately by calling 911 or calling your MD immediately  if symptoms less severe.  You Must read complete instructions/literature along with all the possible adverse reactions/side effects for all the Medicines you take and that have been prescribed to you. Take any new Medicines after you have completely understood and accpet all the possible adverse reactions/side effects.   Please note  You were cared for by a hospitalist during your hospital stay. If you have any questions about your discharge medications or the care you received while you were in the hospital after you are discharged, you can call the unit and asked to speak with the hospitalist on call if the hospitalist that took care of you is not available. Once you are discharged, your primary care physician will handle any further medical issues. Please note that NO REFILLS for any discharge medications will be authorized once you are discharged, as it is imperative that you return to your primary care physician (or establish a relationship with a primary care physician if you do not have one) for your aftercare needs so that they can reassess your need for medications and monitor your lab values.    On the day of Discharge:  VITAL SIGNS:  Blood pressure (!) 161/77, pulse 75, temperature 98.4 F (36.9 C), temperature source  Oral, resp. rate 16, height 5\' 7"  (1.702 m), weight 68.7 kg, SpO2 98 %. PHYSICAL EXAMINATION:  GENERAL:  68 y.o.-year-old patient lying in the bed with no acute distress.  EYES: Pupils equal, round, reactive to light and accommodation. No scleral icterus. Extraocular muscles intact.  HEENT: Head atraumatic, normocephalic. Oropharynx and nasopharynx clear.  NECK:  Supple, no jugular venous distention. No thyroid enlargement, no tenderness.  LUNGS: Normal breath sounds bilaterally, no wheezing, rales,rhonchi or crepitation. No use of accessory muscles of respiration.  CARDIOVASCULAR: S1, S2 normal. No murmurs, rubs, or gallops.  ABDOMEN: Soft, non-tender, non-distended. Bowel sounds present. No organomegaly or mass.  EXTREMITIES: No pedal edema, cyanosis, or clubbing.  NEUROLOGIC: Cranial nerves II through XII are intact. Muscle strength 5/5 in all extremities. Sensation intact. Gait not checked.  PSYCHIATRIC: The patient is alert and oriented x 3.  SKIN: No obvious rash, lesion, or ulcer.  DATA REVIEW:   CBC Recent Labs  Lab 01/16/19 0338  WBC 5.3  HGB 10.0*  HCT  31.0*  PLT 130*    Chemistries  Recent Labs  Lab 01/12/19 1439 01/16/19 0338  NA 139 141  K 4.7 5.8*  CL 117* 116*  CO2 18* 19*  GLUCOSE 220* 188*  BUN 31* 30*  CREATININE 2.79* 2.36*  CALCIUM 8.2* 8.2*  AST 27  --   ALT 28  --   ALKPHOS 91  --   BILITOT 0.6  --      Follow-up Information    [name], MD In 1 week.   Specialty:  Family Medicine Contact information: [place] [address] [phone]        Vladimir Crofts, MD On 01/25/2019.   Specialty:  Neurology Why:  virtual appointment @10am  Contact information: [place] [facility] [place] [address] [phone]        Isaias Cowman, MD On 01/27/2019.   Specialty:  Cardiology Why:  appointment @10 :30am Contact information: [facility] [place] [address] [phone]            Management plans discussed with the patient, family and they are in agreement.  CODE STATUS: Prior   TOTAL TIME TAKING CARE OF THIS PATIENT: 45 minutes.    Max Sane M.D on 01/17/2019 at 7:58 AM  Between 7am to 6pm - Pager - [phone]  After 6pm go to [email or web address] - Proofreader  [facility]  Office  [phone]  CC: Primary care physician; [name], MD   Note: This dictation was prepared with Dragon dictation along with smaller phrase technology. Any transcriptional errors that result from this process are unintentional.

## 2019-01-27 NOTE — Telephone Encounter (Signed)
Sending to scheduling pool and removing from the C19 cancel pool. 

## 2019-02-24 NOTE — Telephone Encounter (Signed)
Spoke with Cameron Adams at [facility] .  Patient last ov [facility] .  Per [facility] patient had holter in may and is followed with Aline Brochure  At [place] cards  Unable to reach patient directly removing from wq.

## 2019-03-28 ENCOUNTER — Telehealth: Payer: Self-pay | Admitting: Student

## 2019-03-28 NOTE — Telephone Encounter (Signed)
Talked with patient's daughter Vashti Hey and have scheduled an in-person Palliative consult for 03/30/19 @ 12 Noon.  COVID19 screening was negative.

## 2019-03-30 ENCOUNTER — Other Ambulatory Visit: Payer: Medicare Other | Admitting: Student

## 2019-03-30 ENCOUNTER — Other Ambulatory Visit: Payer: Self-pay

## 2019-03-30 DIAGNOSIS — Z515 Encounter for palliative care: Secondary | ICD-10-CM

## 2019-03-30 NOTE — Progress Notes (Signed)
[facility] Pall[facility] Care Consult Note Telephone: [phone]  Fax: [phone]  PATIENT NAME: Cameron Adams DOB: 01-03-1951 MRN: [id number]  PRIMARY CARE PROVIDER:   [name], MD  REFERRING PROVIDER:  [name], MD [place] [facility] [place],  [place] [address]  RESPONSIBLE PARTY: Daughter, Vashti Hey    ASSESSMENT:  NP explained role of Palliative Medicine. Cameron Adams is alert and oriented x 3, some forgetfulness noted. Daughter [name] is present. We discussed goals of care. Cameron Adams states he would like to improve and stay in the home. We discussed participating in therapy; he does agree to have therapy evaluation. We discussed symptom management; we discussed his pain management. Daughter is encouraged to give acetaminophen routinely for several days to see if this helps. We also discussed medication for moderate/severe pain. Recommendation for hydrocodone-acetaminophen 5/325 one tab every 6 hour prn; daughter is in agreement. Education provided on 3000mg  daily max of acetaminophen; she verbalizes understanding. Moist heat is also encouraged prn. We briefly discussed advanced care planning; Cameron Adams does have a DNR. NP started to review a MOST form. Daughter would like to review with her sisters; will fill out on next visit. Patient and daughter were given the opportunity to ask questions. Palliative Medicine will provide support and make recommendations as needed.      RECOMMENDATIONS and PLAN:  1. Code status: DNR. 2. Medical goals of therapy: Request for therapy evaluation due to impaired balance, frequent falls. Follow up with neurologist, nephrology and cardiologist as scheduled. 3. Symptom management: pain-continue acetaminophen 975mg  every 8 hours prn. Recommendation for hydrocodone-acetaminophen 5/325mg  one tab every 6 hours prn, apply moist heat x 20 minutes prn to neck.  4. Mr.  Gladden wil continue reside at home with daughter and family support.  Dr. Raylene Miyamoto office notified with request for hydrocodone-acetaminophen prn, request for therapy evaluation due to impaired balanced/frequent falls.   Palliative Medicine will follow up in two weeks or sooner, if needed.   I spent 60 minutes providing this consultation,  from 12:00PM to 1:00PM. More than 50% of the time in this consultation was spent coordinating communication.   HISTORY OF PRESENT ILLNESS:  Cameron Adams is a 68 y.o.  male with multiple medical problems including chronic systolic heart failure, CKD-stage 4, anemia of chronic disease, hypothyroidism, syncope and collapse, orthostatic hypotension. Cameron Adams had received dialysis x4 months; stopped dialysis in February per daughter. Palliative Care was asked to help address goals of care. [name] presently resides with his daughter. He is having frequent falls, almost daily per daughter. He does report occasional dizziness and light-headedness, but states this has improved some. He denies standing up fast or making any sudden movements. He last received therapy in April. Daughter [name] reports patient's balance worsening since therapy ended. 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 Daughter also reports patient having "jerky movements, goes incoherent." Episodes last 10-15 minutes; he has been hospitalized twice this year related to this. He fluctuates week to week per daughter; this week he is doing better. He is eating better this week, twice daily. He was 152 pounds in April, most recent weight was 140 pounds per daughter. He is sleeping more, staying in the bed, taking less interest in activities. He denies feeling depressed. He does report left neck pain, 7/10; pain is "a constant, achy pain." He first noticed about 3 months ago. He states  he was also having lower back pain, but that has  improved. Movement makes the pain worse. He occasionally takes tylenol; has tried heating pad with little relief. He denies shortness of breath, nausea. He does have loose stools; takes imodium prn. Daughter expresses concern with frequent falls. He has an EEG tomorrow. He has scheduled lab appointment in the next week.     CODE STATUS: DNR  PPS: 60% HOSPICE ELIGIBILITY/DIAGNOSIS: TBD  PAST MEDICAL HISTORY:  Past Medical History:  Diagnosis Date   Anemia of chronic disease    Blindness of left eye    Chronic kidney disease, stage III (moderate) (HCC)    Diabetes ([place])    Diarrhea    Diastolic dysfunction    a. 06/2017 Echo: EF 55-60%, no rwma, Gr2 DD, Asc Ao 3.9cm, nl RV fxn, nl PASP.   Hypertension    Hypothyroidism    Monoclonal gammopathy of unknown significance (MGUS)    a. prev seen @ [facility].  Lost to f/u - never had BM bx.   Orthostatic hypotension    Syncope and collapse     SOCIAL HX:  Social History   Tobacco Use   Smoking status: Former Smoker    Packs/day: 0.10    Years: 25.00    Pack years: 2.50    Types: Cigarettes    Quit date: 10/31/1978    Years since quitting: 40.4   Smokeless tobacco: Never Used   Tobacco comment: smoked 2-3 cigarettes/day  Substance Use Topics   Alcohol use: Not Currently    Comment: previously drank heavily but not in many years    ALLERGIES:  Allergies  Allergen Reactions   Gabapentin     Other reaction(s): Other (See Comments) Trouble sleeping      PERTINENT MEDICATIONS:  Outpatient Encounter Medications as of 03/30/2019  Medication Sig   aspirin EC 81 MG tablet Take 81 mg by mouth daily.   atorvastatin (LIPITOR) 40 MG tablet Take 40 mg by mouth at bedtime.   Cholecalciferol (VITAMIN D-1000 MAX ST) 25 MCG (1000 UT) tablet Take 2,000 Units by mouth daily.    citalopram (CELEXA) 20 MG tablet Take 20 mg by mouth daily.   clopidogrel (PLAVIX) 75 MG tablet Take 1 tablet (75 mg total) by mouth daily.    divalproex (DEPAKOTE ER) 500 MG 24 hr tablet Take 1 tablet (500 mg total) by mouth 2 (two) times daily.   glipiZIDE (GLUCOTROL) 5 MG tablet Take 2.5 mg by mouth daily before breakfast.    levothyroxine (SYNTHROID, LEVOTHROID) 100 MCG tablet Take 1 tablet (100 mcg total) by mouth daily before breakfast. (Patient taking differently: Take 112 mcg by mouth daily before breakfast. )   lipase/protease/amylase (CREON) 36000 UNITS CPEP capsule Take 2 capsules (72,000 Units total) by mouth 3 (three) times daily with meals.   loperamide (IMODIUM) 2 MG capsule Take 1 capsule (2 mg total) by mouth every 6 (six) hours as needed for diarrhea or loose stools.   acidophilus (RISAQUAD) CAPS capsule Take 1 capsule by mouth daily.   hydroxypropyl methylcellulose / hypromellose (ISOPTO TEARS / GONIOVISC) 2.5 % ophthalmic solution Place 1 drop into both eyes 3 (three) times daily as needed for dry eyes.   hydrOXYzine (ATARAX/VISTARIL) 10 MG tablet Take 1 tablet (10 mg total) by mouth every 8 (eight) hours as needed for anxiety.   No facility-administered encounter medications on file as of 03/30/2019.     PHYSICAL EXAM:   General: NAD, frail appearing, thin Cardiovascular: regular rate and rhythm  Pulmonary: clear ant fields Abdomen: soft, nontender, + bowel sounds GU: no suprapubic tenderness Extremities: trace edema, no joint deformities Skin: no rashes Neurological: Weakness but otherwise nonfocal  Ezekiel Slocumb, NP

## 2019-04-01 ENCOUNTER — Telehealth: Payer: Self-pay | Admitting: Student

## 2019-04-01 NOTE — Telephone Encounter (Signed)
Palliative NP called and left message on daughter's phone regarding recommendation for pain medication. Per Amy at Dr. Raylene Miyamoto office, patient would need to try to see if acetaminophen is effective first. Dr. Netty Starring was out of the office today.

## 2019-04-24 ENCOUNTER — Other Ambulatory Visit: Payer: Self-pay

## 2019-04-24 ENCOUNTER — Emergency Department: Payer: Medicare Other

## 2019-04-24 ENCOUNTER — Inpatient Hospital Stay
Admission: EM | Admit: 2019-04-24 | Discharge: 2019-04-30 | DRG: 689 | Disposition: A | Discharge status: Skilled Nursing Facility | Payer: Medicare Other | Attending: Specialist | Admitting: Specialist

## 2019-04-24 DIAGNOSIS — R5381 Other malaise: Secondary | ICD-10-CM | POA: Diagnosis present

## 2019-04-24 DIAGNOSIS — D631 Anemia in chronic kidney disease: Secondary | ICD-10-CM | POA: Diagnosis present

## 2019-04-24 DIAGNOSIS — N179 Acute kidney failure, unspecified: Secondary | ICD-10-CM | POA: Diagnosis present

## 2019-04-24 DIAGNOSIS — R569 Unspecified convulsions: Secondary | ICD-10-CM | POA: Diagnosis present

## 2019-04-24 DIAGNOSIS — R440 Auditory hallucinations: Secondary | ICD-10-CM | POA: Diagnosis present

## 2019-04-24 DIAGNOSIS — Z7982 Long term (current) use of aspirin: Secondary | ICD-10-CM

## 2019-04-24 DIAGNOSIS — Z841 Family history of disorders of kidney and ureter: Secondary | ICD-10-CM

## 2019-04-24 DIAGNOSIS — D472 Monoclonal gammopathy: Secondary | ICD-10-CM | POA: Diagnosis present

## 2019-04-24 DIAGNOSIS — I130 Hypertensive heart and chronic kidney disease with heart failure and stage 1 through stage 4 chronic kidney disease, or unspecified chronic kidney disease: Secondary | ICD-10-CM | POA: Diagnosis present

## 2019-04-24 DIAGNOSIS — E039 Hypothyroidism, unspecified: Secondary | ICD-10-CM | POA: Diagnosis present

## 2019-04-24 DIAGNOSIS — R410 Disorientation, unspecified: Secondary | ICD-10-CM

## 2019-04-24 DIAGNOSIS — R633 Feeding difficulties: Secondary | ICD-10-CM | POA: Diagnosis present

## 2019-04-24 DIAGNOSIS — Z7902 Long term (current) use of antithrombotics/antiplatelets: Secondary | ICD-10-CM

## 2019-04-24 DIAGNOSIS — R339 Retention of urine, unspecified: Secondary | ICD-10-CM | POA: Diagnosis not present

## 2019-04-24 DIAGNOSIS — Z79890 Hormone replacement therapy (postmenopausal): Secondary | ICD-10-CM

## 2019-04-24 DIAGNOSIS — N184 Chronic kidney disease, stage 4 (severe): Secondary | ICD-10-CM | POA: Diagnosis present

## 2019-04-24 DIAGNOSIS — I5032 Chronic diastolic (congestive) heart failure: Secondary | ICD-10-CM | POA: Diagnosis present

## 2019-04-24 DIAGNOSIS — Z89421 Acquired absence of other right toe(s): Secondary | ICD-10-CM

## 2019-04-24 DIAGNOSIS — Z79899 Other long term (current) drug therapy: Secondary | ICD-10-CM

## 2019-04-24 DIAGNOSIS — Z888 Allergy status to other drugs, medicaments and biological substances status: Secondary | ICD-10-CM

## 2019-04-24 DIAGNOSIS — Z87891 Personal history of nicotine dependence: Secondary | ICD-10-CM

## 2019-04-24 DIAGNOSIS — Z20828 Contact with and (suspected) exposure to other viral communicable diseases: Secondary | ICD-10-CM | POA: Diagnosis present

## 2019-04-24 DIAGNOSIS — H5462 Unqualified visual loss, left eye, normal vision right eye: Secondary | ICD-10-CM | POA: Diagnosis present

## 2019-04-24 DIAGNOSIS — Z8673 Personal history of transient ischemic attack (TIA), and cerebral infarction without residual deficits: Secondary | ICD-10-CM

## 2019-04-24 DIAGNOSIS — E785 Hyperlipidemia, unspecified: Secondary | ICD-10-CM | POA: Diagnosis present

## 2019-04-24 DIAGNOSIS — Z7984 Long term (current) use of oral hypoglycemic drugs: Secondary | ICD-10-CM

## 2019-04-24 DIAGNOSIS — K8681 Exocrine pancreatic insufficiency: Secondary | ICD-10-CM | POA: Diagnosis present

## 2019-04-24 DIAGNOSIS — R4182 Altered mental status, unspecified: Secondary | ICD-10-CM | POA: Diagnosis present

## 2019-04-24 DIAGNOSIS — N390 Urinary tract infection, site not specified: Principal | ICD-10-CM | POA: Diagnosis present

## 2019-04-24 DIAGNOSIS — E1122 Type 2 diabetes mellitus with diabetic chronic kidney disease: Secondary | ICD-10-CM | POA: Diagnosis present

## 2019-04-24 DIAGNOSIS — K861 Other chronic pancreatitis: Secondary | ICD-10-CM | POA: Diagnosis present

## 2019-04-24 DIAGNOSIS — R296 Repeated falls: Secondary | ICD-10-CM | POA: Diagnosis present

## 2019-04-24 DIAGNOSIS — G9341 Metabolic encephalopathy: Secondary | ICD-10-CM | POA: Diagnosis present

## 2019-04-24 DIAGNOSIS — R441 Visual hallucinations: Secondary | ICD-10-CM | POA: Diagnosis present

## 2019-04-24 DIAGNOSIS — Z8249 Family history of ischemic heart disease and other diseases of the circulatory system: Secondary | ICD-10-CM

## 2019-04-24 DIAGNOSIS — R2681 Unsteadiness on feet: Secondary | ICD-10-CM | POA: Diagnosis present

## 2019-04-24 DIAGNOSIS — R451 Restlessness and agitation: Secondary | ICD-10-CM | POA: Diagnosis not present

## 2019-04-24 DIAGNOSIS — IMO0001 Reserved for inherently not codable concepts without codable children: Secondary | ICD-10-CM

## 2019-04-24 LAB — SARS CORONAVIRUS 2 BY RT PCR (HOSPITAL ORDER, PERFORMED IN ~~LOC~~ HOSPITAL LAB): SARS Coronavirus 2: NEGATIVE

## 2019-04-24 LAB — COMPREHENSIVE METABOLIC PANEL
ALT: 14 U/L (ref 0–44)
AST: 27 U/L (ref 15–41)
Albumin: 2.7 g/dL — ABNORMAL LOW (ref 3.5–5.0)
Alkaline Phosphatase: 65 U/L (ref 38–126)
Anion gap: 9 (ref 5–15)
BUN: 34 mg/dL — ABNORMAL HIGH (ref 8–23)
CO2: 23 mmol/L (ref 22–32)
Calcium: 7.8 mg/dL — ABNORMAL LOW (ref 8.9–10.3)
Chloride: 108 mmol/L (ref 98–111)
Creatinine, Ser: 2.85 mg/dL — ABNORMAL HIGH (ref 0.61–1.24)
GFR calc Af Amer: 25 mL/min — ABNORMAL LOW (ref >60)
GFR calc non Af Amer: 22 mL/min — ABNORMAL LOW (ref >60)
Glucose, Bld: 232 mg/dL — ABNORMAL HIGH (ref 70–99)
Potassium: 3.7 mmol/L (ref 3.5–5.1)
Sodium: 140 mmol/L (ref 135–145)
Total Bilirubin: 0.5 mg/dL (ref 0.3–1.2)
Total Protein: 5.7 g/dL — ABNORMAL LOW (ref 6.5–8.1)

## 2019-04-24 LAB — URINALYSIS, COMPLETE (UACMP) WITH MICROSCOPIC
Bilirubin Urine: NEGATIVE
Glucose, UA: >=500 mg/dL — AB
Ketones, ur: NEGATIVE mg/dL
Nitrite: NEGATIVE
Protein, ur: 100 mg/dL — AB
Specific Gravity, Urine: 1.007 (ref 1.005–1.030)
Squamous Epithelial / LPF: NONE SEEN (ref 0–5)
pH: 6 (ref 5.0–8.0)

## 2019-04-24 LAB — URINE DRUG SCREEN, QUALITATIVE (ARMC ONLY)
Amphetamines, Ur Screen: NOT DETECTED
Barbiturates, Ur Screen: NOT DETECTED
Benzodiazepine, Ur Scrn: NOT DETECTED
Cannabinoid 50 Ng, Ur ~~LOC~~: NOT DETECTED
Cocaine Metabolite,Ur ~~LOC~~: NOT DETECTED
MDMA (Ecstasy)Ur Screen: NOT DETECTED
Methadone Scn, Ur: NOT DETECTED
Opiate, Ur Screen: NOT DETECTED
Phencyclidine (PCP) Ur S: NOT DETECTED
Tricyclic, Ur Screen: NOT DETECTED

## 2019-04-24 LAB — PROTIME-INR
INR: 1.2 (ref 0.8–1.2)
Prothrombin Time: 14.7 seconds (ref 11.4–15.2)

## 2019-04-24 LAB — AMMONIA
Ammonia: 33 umol/L (ref 9–35)
Ammonia: <9 umol/L — ABNORMAL LOW (ref 9–35)

## 2019-04-24 LAB — GLUCOSE, CAPILLARY
Glucose-Capillary: 137 mg/dL — ABNORMAL HIGH (ref 70–99)
Glucose-Capillary: 96 mg/dL (ref 70–99)

## 2019-04-24 LAB — SALICYLATE LEVEL: Salicylate Lvl: <7 mg/dL (ref 2.8–30.0)

## 2019-04-24 LAB — ACETAMINOPHEN LEVEL: Acetaminophen (Tylenol), Serum: <10 ug/mL — ABNORMAL LOW (ref 10–30)

## 2019-04-24 LAB — CBC
HCT: 27.9 % — ABNORMAL LOW (ref 39.0–52.0)
Hemoglobin: 9 g/dL — ABNORMAL LOW (ref 13.0–17.0)
MCH: 29.1 pg (ref 26.0–34.0)
MCHC: 32.3 g/dL (ref 30.0–36.0)
MCV: 90.3 fL (ref 80.0–100.0)
Platelets: 98 10*3/uL — ABNORMAL LOW (ref 150–400)
RBC: 3.09 MIL/uL — ABNORMAL LOW (ref 4.22–5.81)
RDW: 15.5 % (ref 11.5–15.5)
WBC: 4.8 10*3/uL (ref 4.0–10.5)
nRBC: 0 % (ref 0.0–0.2)

## 2019-04-24 LAB — FOLATE: Folate: 26 ng/mL (ref >5.9)

## 2019-04-24 LAB — ETHANOL: Alcohol, Ethyl (B): <10 mg/dL (ref <10)

## 2019-04-24 LAB — VALPROIC ACID LEVEL: Valproic Acid Lvl: 54 ug/mL (ref 50.0–100.0)

## 2019-04-24 LAB — TSH: TSH: 5.188 u[IU]/mL — ABNORMAL HIGH (ref 0.350–4.500)

## 2019-04-24 MED ORDER — HALOPERIDOL LACTATE 5 MG/ML IJ SOLN
5.0000 mg | Freq: Once | INTRAMUSCULAR | Status: AC
  Administered 2019-04-24: 15:00:00 5 mg via INTRAVENOUS

## 2019-04-24 MED ORDER — HALOPERIDOL LACTATE 5 MG/ML IJ SOLN: 2.0000 mg | Freq: Four times a day (QID) | INTRAMUSCULAR | Status: DC | PRN

## 2019-04-24 MED ORDER — ATORVASTATIN CALCIUM 20 MG PO TABS
40.0000 mg | ORAL_TABLET | Freq: Every day | ORAL | Status: DC
  Administered 2019-04-26 – 2019-04-29 (×4): 40 mg via ORAL
  Filled 2019-04-24 (×4): qty 2

## 2019-04-24 MED ORDER — ACETAMINOPHEN 650 MG RE SUPP: 650.0000 mg | Freq: Four times a day (QID) | RECTAL | Status: DC | PRN

## 2019-04-24 MED ORDER — VITAMIN D 25 MCG (1000 UNIT) PO TABS
2000.0000 [IU] | ORAL_TABLET | Freq: Every day | ORAL | Status: DC
  Administered 2019-04-25 – 2019-04-30 (×6): 2000 [IU] via ORAL
  Filled 2019-04-24 (×6): qty 2

## 2019-04-24 MED ORDER — LEVOTHYROXINE SODIUM 112 MCG PO TABS
112.0000 ug | ORAL_TABLET | Freq: Every day | ORAL | Status: DC
  Filled 2019-04-24: qty 1

## 2019-04-24 MED ORDER — HALOPERIDOL LACTATE 5 MG/ML IJ SOLN
INTRAMUSCULAR | Status: AC
  Administered 2019-04-24: 15:00:00 5 mg via INTRAVENOUS
  Filled 2019-04-24: qty 1

## 2019-04-24 MED ORDER — INSULIN ASPART 100 UNIT/ML ~~LOC~~ SOLN
0.0000 [IU] | Freq: Three times a day (TID) | SUBCUTANEOUS | Status: DC
  Administered 2019-04-24 – 2019-04-26 (×3): 1 [IU] via SUBCUTANEOUS
  Administered 2019-04-26: 2 [IU] via SUBCUTANEOUS
  Administered 2019-04-26: 17:00:00 3 [IU] via SUBCUTANEOUS
  Administered 2019-04-27: 17:00:00 5 [IU] via SUBCUTANEOUS
  Administered 2019-04-28 – 2019-04-29 (×3): 1 [IU] via SUBCUTANEOUS
  Administered 2019-04-29: 08:00:00 2 [IU] via SUBCUTANEOUS
  Administered 2019-04-29 – 2019-04-30 (×2): 1 [IU] via SUBCUTANEOUS
  Filled 2019-04-24 (×12): qty 1

## 2019-04-24 MED ORDER — LABETALOL HCL 5 MG/ML IV SOLN
10.0000 mg | Freq: Once | INTRAVENOUS | Status: AC
  Administered 2019-04-24: 10 mg via INTRAVENOUS
  Filled 2019-04-24: qty 4

## 2019-04-24 MED ORDER — DIVALPROEX SODIUM ER 500 MG PO TB24
500.0000 mg | ORAL_TABLET | Freq: Two times a day (BID) | ORAL | Status: DC
  Filled 2019-04-24 (×3): qty 1

## 2019-04-24 MED ORDER — LABETALOL HCL 5 MG/ML IV SOLN
10.0000 mg | INTRAVENOUS | Status: DC | PRN
  Administered 2019-04-25: 06:00:00 10 mg via INTRAVENOUS
  Filled 2019-04-24: qty 4

## 2019-04-24 MED ORDER — ACETAMINOPHEN 325 MG PO TABS
650.0000 mg | ORAL_TABLET | Freq: Four times a day (QID) | ORAL | Status: DC | PRN
  Administered 2019-04-26: 10:00:00 650 mg via ORAL
  Filled 2019-04-24: qty 2

## 2019-04-24 MED ORDER — ONDANSETRON HCL 4 MG/2ML IJ SOLN: 4.0000 mg | Freq: Four times a day (QID) | INTRAMUSCULAR | Status: DC | PRN

## 2019-04-24 MED ORDER — ASPIRIN EC 81 MG PO TBEC
81.0000 mg | DELAYED_RELEASE_TABLET | Freq: Every day | ORAL | Status: DC
  Administered 2019-04-25 – 2019-04-30 (×6): 81 mg via ORAL
  Filled 2019-04-24 (×6): qty 1

## 2019-04-24 MED ORDER — PANCRELIPASE (LIP-PROT-AMYL) 12000-38000 UNITS PO CPEP
48000.0000 [IU] | ORAL_CAPSULE | Freq: Three times a day (TID) | ORAL | Status: DC
  Administered 2019-04-25 – 2019-04-30 (×13): 48000 [IU] via ORAL
  Filled 2019-04-24 (×14): qty 4

## 2019-04-24 MED ORDER — HEPARIN SODIUM (PORCINE) 5000 UNIT/ML IJ SOLN
5000.0000 [IU] | Freq: Three times a day (TID) | INTRAMUSCULAR | Status: DC
  Administered 2019-04-24 – 2019-04-30 (×17): 5000 [IU] via SUBCUTANEOUS
  Filled 2019-04-24 (×18): qty 1

## 2019-04-24 MED ORDER — POLYETHYLENE GLYCOL 3350 17 G PO PACK: 17.0000 g | PACK | Freq: Every day | ORAL | Status: DC | PRN

## 2019-04-24 MED ORDER — CITALOPRAM HYDROBROMIDE 20 MG PO TABS
20.0000 mg | ORAL_TABLET | Freq: Every day | ORAL | Status: DC
  Administered 2019-04-26 – 2019-04-30 (×5): 20 mg via ORAL
  Filled 2019-04-24 (×5): qty 1

## 2019-04-24 MED ORDER — HYDRALAZINE HCL 20 MG/ML IJ SOLN
5.0000 mg | INTRAMUSCULAR | Status: DC | PRN
  Administered 2019-04-25 – 2019-04-26 (×2): 5 mg via INTRAVENOUS
  Filled 2019-04-24 (×2): qty 1

## 2019-04-24 MED ORDER — ONDANSETRON HCL 4 MG PO TABS: 4.0000 mg | ORAL_TABLET | Freq: Four times a day (QID) | ORAL | Status: DC | PRN

## 2019-04-24 MED ORDER — INSULIN ASPART 100 UNIT/ML ~~LOC~~ SOLN
0.0000 [IU] | Freq: Every day | SUBCUTANEOUS | Status: DC
  Administered 2019-04-27: 2 [IU] via SUBCUTANEOUS
  Filled 2019-04-24: qty 1

## 2019-04-24 MED ORDER — CLOPIDOGREL BISULFATE 75 MG PO TABS
75.0000 mg | ORAL_TABLET | Freq: Every day | ORAL | Status: DC
  Administered 2019-04-25 – 2019-04-30 (×6): 75 mg via ORAL
  Filled 2019-04-24 (×6): qty 1

## 2019-04-24 MED ORDER — SODIUM BICARBONATE 650 MG PO TABS
650.0000 mg | ORAL_TABLET | Freq: Two times a day (BID) | ORAL | Status: DC
  Administered 2019-04-26 – 2019-04-30 (×9): 650 mg via ORAL
  Filled 2019-04-24 (×13): qty 1

## 2019-04-24 MED ORDER — SODIUM CHLORIDE 0.9 % IV BOLUS
1000.0000 mL | Freq: Once | INTRAVENOUS | Status: AC
  Administered 2019-04-24: 1000 mL via INTRAVENOUS

## 2019-04-24 NOTE — ED Notes (Signed)
Pt continues to attempt to climb out of bed. Bed rails remain raised, two RN's at bedside as well as daughter. Attempting to rediect pt, pt not following commands. Bed placed in trendelenburg to allow gravity to pull pt backward from end of bed. Pt slowly laid back. IV wrapped in gauze and coban. Daughter left at this time.

## 2019-04-24 NOTE — ED Notes (Signed)
Report given to Meghan, RN
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
## 2019-04-24 NOTE — ED Triage Notes (Addendum)
Pt arrives via EMS after having 4 falls since 1AM- pt is on plavix- pt cannot remember daughters names and could not feed self lunch- this is not his baseline- per daughter pt was hallucinating
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
## 2019-04-24 NOTE — ED Notes (Signed)
Pt attempting to climb out of stretcher. Sides rails are raised. Pt sitting on end of bed. Pt is not following commands, is uncooperative. Tenses up when touched to try to lay back in bed. Orders obtained from [facility].

## 2019-04-24 NOTE — ED Notes (Signed)
Patient transported to CT 

## 2019-04-24 NOTE — ED Notes (Signed)
Safety sitter, Event organiser, at bedside.

## 2019-04-24 NOTE — ED Notes (Signed)
Daughter at bedside. Pt is stating that the time is after 8 o'clock. When asked where pt sees the time (there is not a clock in the pt room) he points toward wall where hand sanitizer and thermostat are. Pt informed that these are not a clock and that time is actually 2:15pm.

## 2019-04-25 ENCOUNTER — Other Ambulatory Visit: Payer: Medicare Other

## 2019-04-25 ENCOUNTER — Inpatient Hospital Stay: Payer: Medicare Other

## 2019-04-25 DIAGNOSIS — R410 Disorientation, unspecified: Secondary | ICD-10-CM

## 2019-04-25 DIAGNOSIS — R440 Auditory hallucinations: Secondary | ICD-10-CM

## 2019-04-25 DIAGNOSIS — R451 Restlessness and agitation: Secondary | ICD-10-CM

## 2019-04-25 DIAGNOSIS — R441 Visual hallucinations: Secondary | ICD-10-CM

## 2019-04-25 DIAGNOSIS — R4182 Altered mental status, unspecified: Secondary | ICD-10-CM

## 2019-04-25 LAB — GLUCOSE, CAPILLARY
Glucose-Capillary: 90 mg/dL (ref 70–99)
Glucose-Capillary: 84 mg/dL (ref 70–99)
Glucose-Capillary: 130 mg/dL — ABNORMAL HIGH (ref 70–99)
Glucose-Capillary: 200 mg/dL — ABNORMAL HIGH (ref 70–99)
Glucose-Capillary: 60 mg/dL — ABNORMAL LOW (ref 70–99)

## 2019-04-25 LAB — BASIC METABOLIC PANEL
Anion gap: 7 (ref 5–15)
BUN: 31 mg/dL — ABNORMAL HIGH (ref 8–23)
CO2: 25 mmol/L (ref 22–32)
Calcium: 8 mg/dL — ABNORMAL LOW (ref 8.9–10.3)
Chloride: 111 mmol/L (ref 98–111)
Creatinine, Ser: 2.58 mg/dL — ABNORMAL HIGH (ref 0.61–1.24)
GFR calc Af Amer: 28 mL/min — ABNORMAL LOW (ref >60)
GFR calc non Af Amer: 24 mL/min — ABNORMAL LOW (ref >60)
Glucose, Bld: 120 mg/dL — ABNORMAL HIGH (ref 70–99)
Potassium: 3.6 mmol/L (ref 3.5–5.1)
Sodium: 143 mmol/L (ref 135–145)

## 2019-04-25 LAB — CBC
HCT: 30 % — ABNORMAL LOW (ref 39.0–52.0)
Hemoglobin: 9.6 g/dL — ABNORMAL LOW (ref 13.0–17.0)
MCH: 28.9 pg (ref 26.0–34.0)
MCHC: 32 g/dL (ref 30.0–36.0)
MCV: 90.4 fL (ref 80.0–100.0)
Platelets: 104 10*3/uL — ABNORMAL LOW (ref 150–400)
RBC: 3.32 MIL/uL — ABNORMAL LOW (ref 4.22–5.81)
RDW: 15 % (ref 11.5–15.5)
WBC: 5 10*3/uL (ref 4.0–10.5)
nRBC: 0 % (ref 0.0–0.2)

## 2019-04-25 LAB — VITAMIN B12: Vitamin B-12: 503 pg/mL (ref 180–914)

## 2019-04-25 MED ORDER — VALPROATE SODIUM 500 MG/5ML IV SOLN
15.0000 mg/kg/d | Freq: Three times a day (TID) | INTRAVENOUS | Status: AC
  Administered 2019-04-25 – 2019-04-26 (×3): 338 mg via INTRAVENOUS
  Filled 2019-04-25 (×4): qty 3.38

## 2019-04-25 MED ORDER — DEXTROSE-NACL 5-0.9 % IV SOLN
INTRAVENOUS | Status: DC
  Administered 2019-04-25 – 2019-04-30 (×6): via INTRAVENOUS

## 2019-04-25 MED ORDER — SODIUM CHLORIDE 0.9 % IV SOLN
1.0000 g | INTRAVENOUS | Status: DC
  Administered 2019-04-25 – 2019-04-29 (×5): 1 g via INTRAVENOUS
  Filled 2019-04-25 (×5): qty 1
  Filled 2019-04-25: qty 10

## 2019-04-25 MED ORDER — DEXTROSE 50 % IV SOLN: 12.5000 g | INTRAVENOUS | Status: AC

## 2019-04-25 MED ORDER — SODIUM CHLORIDE 0.9 % IV SOLN
INTRAVENOUS | Status: DC | PRN
  Administered 2019-04-25 (×2): 250 mL via INTRAVENOUS

## 2019-04-25 MED ORDER — VALPROATE SODIUM 500 MG/5ML IV SOLN
1000.0000 mg | Freq: Once | INTRAVENOUS | Status: AC
  Administered 2019-04-25: 1000 mg via INTRAVENOUS
  Filled 2019-04-25: qty 10

## 2019-04-25 MED ORDER — DEXTROSE 50 % IV SOLN
INTRAVENOUS | Status: AC
  Administered 2019-04-25: 22:00:00 50 mL
  Filled 2019-04-25: qty 50

## 2019-04-25 MED ORDER — LEVOTHYROXINE SODIUM 100 MCG/5ML IV SOLN
75.0000 ug | Freq: Every day | INTRAVENOUS | Status: DC
  Administered 2019-04-25 – 2019-04-27 (×3): 75 ug via INTRAVENOUS
  Filled 2019-04-25 (×4): qty 5

## 2019-04-25 NOTE — Evaluation (Signed)
Clinical/Bedside Swallow Evaluation Patient Details  Name: Cameron Adams MRN: [id number] Date of Birth: Feb 21, 1951  Today's Date: 04/25/2019 Time: SLP Start Time (ACUTE ONLY): 49 SLP Stop Time (ACUTE ONLY): 1300 SLP Time Calculation (min) (ACUTE ONLY): 30 min  Past Medical History:  Past Medical History:  Diagnosis Date  . Anemia of chronic disease   . Blindness of left eye   . Chronic kidney disease, stage III (moderate) (HCC)   . Diabetes ([place])   . Diarrhea   . Diastolic dysfunction    a. 06/2017 Echo: EF 55-60%, no rwma, Gr2 DD, Asc Ao 3.9cm, nl RV fxn, nl PASP.  Marland Kitchen Hypertension   . Hypothyroidism   . Monoclonal gammopathy of unknown significance (MGUS)    a. prev seen @ [facility].  Lost to f/u - never had BM bx.  . Orthostatic hypotension   . Syncope and collapse    Past Surgical History:  Past Surgical History:  Procedure Laterality Date  . 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 PERIPHERAL VASCULAR CATHETERIZATION N/A 09/15/2016   Procedure: Lower Extremity Angiography;  Surgeon: Algernon Huxley, MD;  Location: [place] [facility];  Service: Cardiovascular;  Laterality: N/A;  . THYROID SURGERY     HPI:      Assessment / Plan / Recommendation Clinical Impression  pt presents with a moderate oral phase dysphagia as evidenced by poor awareness of bolus however once pt is aware of bolus is able to transit the bolus to posterior and initiate swallow. pt is legally blind and has difficulty with seeing food presented. pt AMS is further increasing difficulty of intake. pt would benefit from total assist for feeding at this time. pt had no overt ssx aspiration with solids or liquids,  however when presented solids pt had noted deficits with mastication and awareness of bolus trials. pt is recommended to begin diet on Dys 1 with thin at this time with expectations to  improve as mental status improves. RN notified and gave verbal understanding. SLP Visit Diagnosis: Dysphagia, oral phase (R13.11)    Aspiration Risk  Moderate aspiration risk    Diet Recommendation Dysphagia 1 (Puree);Thin liquid   Liquid Administration via: Straw Medication Administration: Crushed with puree Compensations: Slow rate;Small sips/bites;Follow solids with liquid Postural Changes: Seated upright at 90 degrees    Other  Recommendations Oral Care Recommendations: Oral care BID   Follow up Recommendations        Frequency and Duration min 3x week  2 weeks       Prognosis Prognosis for Safe Diet Advancement: Fair Barriers to Reach Goals: Cognitive deficits      Swallow Study   General Date of Onset: 04/25/19 Type of Study: Bedside Swallow Evaluation Diet Prior to this Study: NPO Temperature Spikes Noted: No Respiratory Status: Room air History of Recent Intubation: No Behavior/Cognition: Cooperative;Confused;Agitated;Requires cueing;Doesn't follow directions Oral Cavity Assessment: Within Functional Limits Oral Care Completed by SLP: No Oral Cavity - Dentition: Missing dentition Vision: Impaired for self-feeding Self-Feeding Abilities: Needs assist Patient Positioning: Upright in bed Baseline Vocal Quality: Normal Volitional Cough: Weak Volitional Swallow: Unable to elicit    Oral/Motor/Sensory Function Overall Oral Motor/Sensory Function: Within functional limits   Ice Chips Ice chips: Within functional limits Presentation: Spoon   Thin Liquid Thin Liquid: Within functional limits Presentation: Cup;Straw    Nectar Thick Nectar Thick Liquid: Not tested   Honey Thick  Honey Thick Liquid: Not tested   Puree Puree: Within functional limits Presentation: Spoon   Solid     Solid: Impaired Oral Phase Impairments: Reduced labial seal;Poor awareness of bolus Oral Phase Functional Implications: Prolonged oral transit Pharyngeal Phase Impairments: Wet Vocal  Quality;Multiple swallows      Stacie Harris Sauber 04/25/2019,1:19 PM
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
## 2019-04-25 NOTE — Progress Notes (Signed)
eeg completed ° °

## 2019-04-25 NOTE — Consult Note (Signed)
Neurology Consult  Referring Physician: Otila Back, MD  Chief Complaint/Reason for Consult:  I have been asked to see the patient in neurological consultation to render advice and opinion regarding altered mental status with visual hallucination  HPI: [name] is a 68 y.o.  male with a history significant for type 2 diabetes mellitus, CKD stage III, hypertension, MGUS, chronic diastolic CHF and seizures on VPA (sees Dr. Manuella Ghazi) who presented to the ED last night with agitation altered mental status.  Patient is not a good historian.  History was primarily provided by review of EMR and daughter Cameron Adams) over the phone.  Patient has been hallucinating animals running through his room.  He has not been able to feed himself on the day of admission.  His daughter reported that he has been falling multiple times. She said this has been going on for about a month. About a month ago, the patient told his daughter that his right side has been weak. She's noted some tremors in the right arm as well.   In the ED, vitals significant for hypertension with systolic blood pressures in the low 200s. Labs were significant for creatinine 2.85, hemoglobin 9.0. CT head and c-spine were unremarkable.  Neurology was consulted for further evaluation of altered mental status.  MRI brain without contrast is pending.  Per chart review, patient had a ambulatory EEG on 04/04/2019: "This is an abnormal ambulatory intermittently attended video EEG study lasting 3 hours 11 min due to generalized theta slowing and presence of rare epileptiform discharges were noted in left anterior temporal, posterior frontal region. No typical spell recorded during this study. Left anterior temporal, posterior frontal epileptiform discharges indicates cortical irritability and predisposition to focal onset seizures from this location." VPA level was drawn at 16:17 and it was 54. This was likely a random level.   Per review of  Dr. Trena Platt notes on 04/06/2019: "concerning for seizures in a patient with a history of stroke: Patient presented to [facility] on 01/12/2019 after episode of unresponsiveness patient did have Loss Of Consciousness. Patient's first episode was in March 2020. Patient was having spells at least once a week. Patient has fluctuating blood pressure per daughter. Patient was eating when he had sudden onset of unresponsiveness and right sided weakness, patient loss feeling in his arm. Patient had facial drooping. Patient's tongue was sticking out. Patient's speech was slurred and then patient essentially became mute. Episode lasted about 10 minutes per daughter. While at the hospital they did a stroke work up which was negative for acute stroke. They also performed an EEG which was negative for epileptiform activity. Patient states he is taking Depakote 500 mg two times a day. 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   Past Medical History:  Diagnosis Date  . Anemia of chronic disease   . Blindness of left eye   . Chronic kidney disease, stage III (moderate) (HCC)   . Diabetes ([place])   . Diarrhea   . Diastolic dysfunction    a. 06/2017 Echo: EF 55-60%, no rwma, Gr2 DD, Asc Ao 3.9cm, nl RV fxn, nl PASP.  Marland Kitchen Hypertension   . Hypothyroidism   . Monoclonal gammopathy of unknown significance (MGUS)    a. prev seen @ [facility].  Lost to f/u - never had BM bx.  . Orthostatic hypotension   . Syncope and collapse  Past Surgical History:  Procedure Laterality Date  . 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 PERIPHERAL VASCULAR CATHETERIZATION N/A 09/15/2016   Procedure: Lower Extremity Angiography;  Surgeon: Algernon Huxley, MD;  Location: [facility];   Service: Cardiovascular;  Laterality: N/A;  . THYROID SURGERY     Allergies  Allergen Reactions  . Gabapentin     Other reaction(s): Other (See Comments) Trouble sleeping    Prior to Admission medications   Medication Sig Start Date End Date Taking? Authorizing Provider  acetaminophen (TYLENOL) 500 MG tablet Take 500-1,000 mg by mouth every 6 (six) hours as needed for mild pain, moderate pain or fever.   Yes [provider]  acidophilus (RISAQUAD) CAPS capsule Take 1 capsule by mouth daily.   Yes [provider]  aspirin EC 81 MG tablet Take 81 mg by mouth daily.   Yes [provider]  atorvastatin (LIPITOR) 40 MG tablet Take 40 mg by mouth at bedtime.   Yes [provider]  Cholecalciferol (VITAMIN D-1000 MAX ST) 25 MCG (1000 UT) tablet Take 2,000 Units by mouth daily.    Yes [provider]  citalopram (CELEXA) 20 MG tablet Take 20 mg by mouth daily.   Yes [provider]  clopidogrel (PLAVIX) 75 MG tablet Take 1 tablet (75 mg total) by mouth daily. 12/07/18 12/07/19 Yes Salary, Avel Peace, MD  divalproex (DEPAKOTE ER) 500 MG 24 hr tablet Take 1 tablet (500 mg total) by mouth 2 (two) times daily. 01/16/19  Yes Max Sane, MD  glipiZIDE (GLUCOTROL) 5 MG tablet Take 2.5 mg by mouth 2 (two) times daily.  11/26/18  Yes [provider]  hydrOXYzine (ATARAX/VISTARIL) 10 MG tablet Take 1 tablet (10 mg total) by mouth every 8 (eight) hours as needed for anxiety. 12/07/18  Yes Salary, Avel Peace, MD  levothyroxine (SYNTHROID, LEVOTHROID) 100 MCG tablet Take 1 tablet (100 mcg total) by mouth daily before breakfast. Patient taking differently: Take 112 mcg by mouth daily before breakfast.  12/08/18  Yes Salary, Holly Bodily D, MD  loperamide (IMODIUM) 2 MG capsule Take 1 capsule (2 mg total) by mouth every 6 (six) hours as needed for diarrhea or loose stools. 06/29/17  Yes Vaughan Basta, MD  Pancrelipase, Lip-Prot-Amyl, (CREON) 24000-76000 units  CPEP Take 48,000 Units by mouth 3 (three) times daily with meals.   Yes [provider]  sodium bicarbonate 650 MG tablet Take 650 mg by mouth 2 (two) times daily.   Yes [provider]  lipase/protease/amylase (CREON) 36000 UNITS CPEP capsule Take 2 capsules (72,000 Units total) by mouth 3 (three) times daily with meals. Patient not taking: Reported on 04/24/2019 06/29/17   Vaughan Basta, MD   Family History  Problem Relation Age of Onset  . Kidney failure Mother   . Heart disease Father   . Kidney failure Father   . Heart disease Brother    Relationships  Social connections  . Talks on phone: Not on file  . Gets together: Not on file  . Attends religious service: Not on file  . Active member of club or organization: Not on file  . Attends meetings of clubs or organizations: Not on file  . Relationship status: Not on file      Continuous Infusions: . cefTRIAXone (ROCEPHIN)  IV     Scheduled Meds:  . aspirin EC  81 mg Oral  Daily  . atorvastatin  40 mg Oral QHS  . cholecalciferol  2,000 Units Oral Daily  . citalopram  20 mg Oral Daily  . clopidogrel  75 mg Oral Daily  . divalproex  500 mg Oral BID  . heparin  5,000 Units Subcutaneous Q8H  . insulin aspart  0-5 Units Subcutaneous QHS  . insulin aspart  0-9 Units Subcutaneous TID WC  . levothyroxine  112 mcg Oral QAC breakfast  . lipase/protease/amylase  48,000 Units Oral TID WC  . sodium bicarbonate  650 mg Oral BID   PRN Meds: acetaminophen **OR** acetaminophen, haloperidol lactate, hydrALAZINE, labetalol, ondansetron **OR** ondansetron (ZOFRAN) IV, polyethylene glycol   Review of Systems Review of systems not obtained due to patient factors.   Objective: Temp:  [97.5 F (36.4 C)-98.4 F (36.9 C)] 98.2 F (36.8 C) (07/20 0842) Pulse Rate:  [67-78] 70 (07/20 0842) Resp:  [13-18] 18 (07/20 0842) BP: (130-220)/(69-115) 149/72 (07/20 0842) SpO2:  [98 %-100 %] 100 % (07/20 0842) Weight:  [67.6  kg] 67.6 kg (07/19 1338)  Intake/Output Summary (Last 24 hours) at 04/25/2019 1051 Last data filed at 04/24/2019 1800 Gross per 24 hour  Intake 0 ml  Output -  Net 0 ml    Wt Readings from Last 3 Encounters:  04/24/19 67.6 kg  01/13/19 68.7 kg  12/29/18 67.2 kg      Physical Exam:  GENERAL:  Confused and attempting to get out of bed  CARDIOVASCULAR:   Regular rate RESPIRATORY: no increased work of breathing  GASTROINTESTINAL:   Abdomen: soft, benign   SKIN:   Inspection: well perfused, no edema.   MENTAL STATUS EXAM:    Orientation: Alert but refusing to answer orientation questions.  Memory: Uncooperative, not following any commands.   Language: Speech appears to be dysarthric and language is limited   CRANIAL NERVES:    CN 2 (Optic): Eyes are closed and patient refuses to open them  CN 3,4,6 (EOM): Pupils equal and reactive to light. Dysconjugate gaze when eyes are open but unable to see if patient can track CN 5 (Trigeminal): Grimaces to pain CN 7 (Facial): No facial weakness or asymmetry.  CN 8 (Auditory): Auditory acuity grossly normal.   MOTOR:  Muscle Strength: Strength - Appears to be moving all extremities anti-gravity. Drifted quickly with the right upper extremity. Tone: Tone and muscle bulk are normal in the upper and lower extremities.   REFLEXES:   DTRs - 1+ and symmetrical in all four extremities, plantar responses are muted bilaterally.   COORDINATION:  Unable to do finger-to-nose bilaterally. Mild intentional tremor noted in the right upper   SENSATION:  Does not withdraw to painful stimuli  GAIT: Deferred for safety   Labs: Recent Labs  Lab 04/24/19 1349 04/25/19 0435  WBC 4.8 5.0  HGB 9.0* 9.6*  HCT 27.9* 30.0*  PLT 98* 104*  MCV 90.3 90.4   Recent Labs  Lab 04/24/19 1349 04/25/19 0435  NA 140 143  K 3.7 3.6  CL 108 111  CO2 23 25  BUN 34* 31*  CREATININE 2.85* 2.58*  CALCIUM 7.8* 8.0*      Significant Diagnostic Studies: All images  independently visualized.    Impression: Mr. CAYMEN DUBRAY is a 68 y.o.  male with a history significant for seizures on VPA who presented with visual hallucinations and frequent falls. No witnessed seizures at home per daughter. However, recent EEG at the end of June demonstrated cortical irritability and predisposition to focal onset  seizures from the left temporal lobe. Random VPA level was barely therapeutic at 54. Additionally, patient's daughter stated that the patient has been complaining of right sided weakness for about a month now. Differential is broad at this time but concerned about ischemic stroke, reactivation of prior strokes, complex seizures arising from left temporal lobe. Patient is not able to tolerate PO at this time. So will give Depakote load 1000 mg IV x 1 and then start Depakote 15 mg/kg/day divided TID  Active Problems:   Altered mental status  Recommendations: - Will load with Depakote 1000 mg IV x 1 now.   - Will then start Depakote 15 mg/kg/day divided TID for maintenance 12 hours after load - Can check trough VPA level after a few doses of VPA to check steady state level - Agree with getting MRI brain w/o and routine EEG - Neurology will continue to follow along    It has been a pleasure to participate in the care of this patient.  Best Regards,

## 2019-04-25 NOTE — Consult Note (Signed)
[facility] Consult   Reason for Consult:  Agitation, hallucinations Referring Physician:  Dr Brett Albino Patient Identification: Cameron Adams MRN:  [id number] Principal Diagnosis: altered mental status Diagnosis:  Active Problems:   Altered mental status  Total Time spent with patient: 1 hour  Subjective:   Cameron Adams is a 68 y.o. male patient admitted for treatment of an AKI and altered mental status. On assessment earlier today he was awake and verbal but confused.  Later he was seen and was very drowsy.  Difficult to get an accurate history on him because of his cognitive state.  HPI:  Per ED MD:  Cameron Adams is a 68 y.o. male with a past medical history of CKD, diabetes, hypertension, congestive heart failure brought to the ED due to agitation, confusion, auditory hallucinations that started at 1 AM last night.  Symptoms have been waxing and waning all night during which time the patient remained awake.  He has had multiple falls since then as well and seems unsteady on his feet.  Daughter is present in the ED and states this is never happened before.  He does have a history of seizures as well, he has been compliant with his medications, no changes in medications or new medications.  Denies any drug use or alcohol abuse.  Saw his neurologist about 2 weeks ago for follow-up of possible seizure disorder.  No established history of dementia or prior agitation and confusion like this.  Past Psychiatric History: No known psych history  Risk to Self:  yes Risk to Others:  no Prior Inpatient Therapy:  none Prior Outpatient Therapy:  none  Past Medical History:  Past Medical History:  Diagnosis Date  . Anemia of chronic disease   . Blindness of left eye   . Chronic kidney disease, stage III (moderate) (HCC)   . Diabetes ([place])   . Diarrhea   . Diastolic dysfunction    a. 06/2017 Echo: EF 55-60%, no rwma, Gr2 DD, Asc Ao 3.9cm, nl RV fxn, nl PASP.  Marland Kitchen  Hypertension   . Hypothyroidism   . Monoclonal gammopathy of unknown significance (MGUS)    a. prev seen @ [facility].  Lost to f/u - never had BM bx.  . Orthostatic hypotension   . Syncope and collapse     Past Surgical History:  Procedure Laterality Date  . 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 PERIPHERAL VASCULAR CATHETERIZATION N/A 09/15/2016   Procedure: Lower Extremity Angiography;  Surgeon: Algernon Huxley, MD;  Location: [facility];  Service: Cardiovascular;  Laterality: N/A;  . THYROID SURGERY     Family History:  Family History  Problem Relation Age of Onset  . Kidney failure Mother   . Heart disease Father   . Kidney failure Father   . Heart disease Brother    Family Psychiatric  History: none Social History:  Social History   Substance and Sexual Activity  Alcohol Use Not Currently   Comment: previously drank heavily but not in many years     Social History   Substance and Sexual Activity  Drug Use No    Social History   Socioeconomic History  . Marital status: Single    Spouse name: Not on file  . Number of children: Not on file  . Years of education: Not on file  . Highest education level: Not on  file  Occupational History  . Occupation: Retired  Scientific laboratory technician  . Financial resource strain: Hard  . Food insecurity    Worry: Not on file    Inability: Not on file  . Transportation needs    Medical: No    Non-medical: No  Tobacco Use  . Smoking status: Former Smoker    Packs/day: 0.10    Years: 25.00    Pack years: 2.50    Types: Cigarettes    Quit date: 10/31/1978    Years since quitting: 40.5  . Smokeless tobacco: Never Used  . Tobacco comment: smoked 2-3 cigarettes/day  Substance and Sexual Activity  . Alcohol use: Not Currently    Comment: previously drank heavily but not in many years  . Drug use: No  . Sexual activity:  Not on file  Lifestyle  . Physical activity    Days per week: 3 days    Minutes per session: 20 min  . Stress: Not on file  Relationships  . Social Herbalist on phone: Not on file    Gets together: Not on file    Attends religious service: Not on file    Active member of club or organization: Not on file    Attends meetings of clubs or organizations: Not on file    Relationship status: Not on file  Other Topics Concern  . Not on file  Social History Narrative   Lives in [place] by himself.  Says he is very active at home but does not routinely exercise.  Usual diet is poor - some days he might only drink a few cups of coffee.   Additional Social History:    Allergies:   Allergies  Allergen Reactions  . Gabapentin     Other reaction(s): Other (See Comments) Trouble sleeping     Labs:  Results for orders placed or performed during the hospital encounter of 04/24/19 (from the past 48 hour(s))  Comprehensive metabolic panel     Status: Abnormal   Collection Time: 04/24/19  1:49 PM  Result Value Ref Range   Sodium 140 135 - 145 mmol/L   Potassium 3.7 3.5 - 5.1 mmol/L   [place] 108 98 - 111 mmol/L   CO2 23 22 - 32 mmol/L   Glucose, Bld 232 (H) 70 - 99 mg/dL   BUN 34 (H) 8 - 23 mg/dL   Creatinine, Ser 2.85 (H) 0.61 - 1.24 mg/dL   Calcium 7.8 (L) 8.9 - 10.3 mg/dL   Total Protein 5.7 (L) 6.5 - 8.1 g/dL   Albumin 2.7 (L) 3.5 - 5.0 g/dL   AST 27 15 - 41 U/L   ALT 14 0 - 44 U/L   Alkaline Phosphatase 65 38 - 126 U/L   Total Bilirubin 0.5 0.3 - 1.2 mg/dL   GFR calc non Af Amer 22 (L) >60 mL/min   GFR calc Af Amer 25 (L) >60 mL/min   Anion gap 9 5 - 15    Comment: Performed at [facility], [address] [place], [place] [address]  CBC     Status: Abnormal   Collection Time: 04/24/19  1:49 PM  Result Value Ref Range   WBC 4.8 4.0 - 10.5 K/uL   RBC 3.09 (L) 4.22 - 5.81 MIL/uL   Hemoglobin 9.0 (L) 13.0 - 17.0 g/dL   HCT 27.9 (L) 39.0 - 52.0 %   MCV  90.3 80.0 - 100.0 fL   MCH 29.1 26.0 - 34.0 pg  MCHC 32.3 30.0 - 36.0 g/dL   RDW 15.5 11.5 - 15.5 %   Platelets 98 (L) 150 - 400 K/uL    Comment: Immature Platelet Fraction may be clinically indicated, consider ordering this additional test [id number]    nRBC 0.0 0.0 - 0.2 %    Comment: Performed at [facility], [address] [place], [place] [address]  Protime-INR - (order if patient is taking Coumadin / Warfarin)     Status: None   Collection Time: 04/24/19  1:49 PM  Result Value Ref Range   Prothrombin Time 14.7 11.4 - 15.2 seconds   INR 1.2 0.8 - 1.2    Comment: (NOTE) INR goal varies based on device and disease states. Performed at [facility], [address] [place], [place] [address]   Ethanol     Status: None   Collection Time: 04/24/19  1:51 PM  Result Value Ref Range   Alcohol, Ethyl (B) <10 <10 mg/dL    Comment: (NOTE) Lowest detectable limit for serum alcohol is 10 mg/dL. For medical purposes only. Performed at [facility], [address] [place], [place] [address]   Ammonia     Status: None   Collection Time: 04/24/19  1:51 PM  Result Value Ref Range   Ammonia 33 9 - 35 umol/L    Comment: Performed at [facility], [address] [place], [place] [address]  Acetaminophen level     Status: Abnormal   Collection Time: 04/24/19  1:51 PM  Result Value Ref Range   Acetaminophen (Tylenol), Serum <10 (L) 10 - 30 ug/mL    Comment: (NOTE) Therapeutic concentrations vary significantly. A range of 10-30 ug/mL  may be an effective concentration for many patients. However, some  are best treated at concentrations outside of this range. Acetaminophen concentrations >150 ug/mL at 4 hours after ingestion  and >50 ug/mL at 12 hours after ingestion are often associated with  toxic reactions. Performed at [facility], [address] [place], [place] [address]   Salicylate level     Status: None   Collection Time:  04/24/19  1:51 PM  Result Value Ref Range   Salicylate Lvl <7.9 2.8 - 30.0 mg/dL    Comment: Performed at [facility], [address] [place], [place] [address]  SARS Coronavirus 2 (CEPHEID - Performed in [facility] hospital lab), Hosp Order     Status: None   Collection Time: 04/24/19  2:57 PM   Specimen: Nasopharyngeal Swab  Result Value Ref Range   SARS Coronavirus 2 NEGATIVE NEGATIVE    Comment: (NOTE) If result is NEGATIVE SARS-CoV-2 target nucleic acids are NOT DETECTED. The SARS-CoV-2 RNA is generally detectable in upper and lower  respiratory specimens during the acute phase of infection. The lowest  concentration of SARS-CoV-2 viral copies this assay can detect is 250  copies / mL. A negative result does not preclude SARS-CoV-2 infection  and should not be used as the sole basis for treatment or other  patient management decisions.  A negative result may occur with  improper specimen collection / handling, submission of specimen other  than nasopharyngeal swab, presence of viral mutation(s) within the  areas targeted by this assay, and inadequate number of viral copies  (<250 copies / mL). A negative result must be combined with clinical  observations, patient history, and epidemiological information. If result is POSITIVE SARS-CoV-2 target nucleic acids are DETECTED. The SARS-CoV-2 RNA is generally detectable in upper and lower  respiratory specimens dur ing the acute phase of infection.  Positive  results are indicative of active infection with SARS-CoV-2.  Clinical  correlation with patient history and other diagnostic information is  necessary to determine patient infection status.  Positive results do  not rule out bacterial infection or co-infection with other viruses. If result is PRESUMPTIVE POSTIVE SARS-CoV-2 nucleic acids MAY BE PRESENT.   A presumptive positive result was obtained on the submitted specimen  and confirmed on repeat testing.  While  2019 novel coronavirus  (SARS-CoV-2) nucleic acids may be present in the submitted sample  additional confirmatory testing may be necessary for epidemiological  and / or clinical management purposes  to differentiate between  SARS-CoV-2 and other Sarbecovirus currently known to infect humans.  If clinically indicated additional testing with an alternate test  methodology [phone]) is advised. The SARS-CoV-2 RNA is generally  detectable in upper and lower respiratory sp ecimens during the acute  phase of infection. The expected result is Negative. Fact Sheet for Patients:  StrictlyIdeas.no Fact Sheet for Healthcare Providers: BankingDealers.co.za This test is not yet approved or cleared by the [place] FDA and has been authorized for detection and/or diagnosis of SARS-CoV-2 by FDA under an Emergency Use Authorization (EUA).  This EUA will remain in effect (meaning this test can be used) for the duration of the COVID-19 declaration under Section 564(b)(1) of the Act, 21 U.S.C. section 360bbb-3(b)(1), unless the authorization is terminated or revoked sooner. Performed at [facility], [address] [place], [place] [address]   Valproic acid level     Status: None   Collection Time: 04/24/19  4:17 PM  Result Value Ref Range   Valproic Acid Lvl 54 50.0 - 100.0 ug/mL    Comment: Performed at [facility], [address] [place], [place] [address]  Urinalysis, Complete w Microscopic     Status: Abnormal   Collection Time: 04/24/19  5:12 PM  Result Value Ref Range   Color, Urine YELLOW (A) YELLOW   APPearance CLEAR (A) CLEAR   Specific Gravity, Urine 1.007 1.005 - 1.030   pH 6.0 5.0 - 8.0   Glucose, UA >=500 (A) NEGATIVE mg/dL   Hgb urine dipstick MODERATE (A) NEGATIVE   Bilirubin Urine NEGATIVE NEGATIVE   Ketones, ur NEGATIVE NEGATIVE mg/dL   Protein, ur 100 (A) NEGATIVE mg/dL   Nitrite NEGATIVE NEGATIVE    Leukocytes,Ua TRACE (A) NEGATIVE   RBC / HPF 11-20 0 - 5 RBC/hpf   WBC, UA 21-50 0 - 5 WBC/hpf   Bacteria, UA RARE (A) NONE SEEN   Squamous Epithelial / LPF NONE SEEN 0 - 5   Hyaline Casts, UA PRESENT     Comment: Performed at [facility], [address] [place], [place] [address]  Urine Drug Screen, Qualitative     Status: None   Collection Time: 04/24/19  5:12 PM  Result Value Ref Range   Tricyclic, Ur Screen NONE DETECTED NONE DETECTED   Amphetamines, Ur Screen NONE DETECTED NONE DETECTED   MDMA (Ecstasy)Ur Screen NONE DETECTED NONE DETECTED   Cocaine Metabolite,Ur [place] NONE DETECTED NONE DETECTED   Opiate, Ur Screen NONE DETECTED NONE DETECTED   Phencyclidine (PCP) Ur S NONE DETECTED NONE DETECTED   Cannabinoid 50 Ng, Ur [place] NONE DETECTED NONE DETECTED   Barbiturates, Ur Screen NONE DETECTED NONE DETECTED   Benzodiazepine, Ur Scrn NONE DETECTED NONE DETECTED   Methadone Scn, Ur NONE DETECTED NONE DETECTED    Comment: (NOTE) Tricyclics +  metabolites, urine    Cutoff 1000 ng/mL Amphetamines + metabolites, urine  Cutoff 1000 ng/mL MDMA (Ecstasy), urine              Cutoff 500 ng/mL Cocaine Metabolite, urine          Cutoff 300 ng/mL Opiate + metabolites, urine        Cutoff 300 ng/mL Phencyclidine (PCP), urine         Cutoff 25 ng/mL Cannabinoid, urine                 Cutoff 50 ng/mL Barbiturates + metabolites, urine  Cutoff 200 ng/mL Benzodiazepine, urine              Cutoff 200 ng/mL Methadone, urine                   Cutoff 300 ng/mL The urine drug screen provides only a preliminary, unconfirmed analytical test result and should not be used for non-medical purposes. Clinical consideration and professional judgment should be applied to any positive drug screen result due to possible interfering substances. A more specific alternate chemical method must be used in order to obtain a confirmed analytical result. Gas chromatography / mass spectrometry (GC/MS) is the  preferred confirmat ory method. Performed at [facility], [address] [place], [place] [address]   Glucose, capillary     Status: Abnormal   Collection Time: 04/24/19  5:37 PM  Result Value Ref Range   Glucose-Capillary 137 (H) 70 - 99 mg/dL  TSH     Status: Abnormal   Collection Time: 04/24/19  5:52 PM  Result Value Ref Range   TSH 5.188 (H) 0.350 - 4.500 uIU/mL    Comment: Performed by a 3rd Generation assay with a functional sensitivity of <=0.01 uIU/mL. Performed at [facility], [address] [place], [place] [address]   Vitamin B12     Status: None   Collection Time: 04/24/19  5:52 PM  Result Value Ref Range   Vitamin B-12 503 180 - 914 pg/mL    Comment: (NOTE) This assay is not validated for testing neonatal or myeloproliferative syndrome specimens for Vitamin B12 levels. Performed at [facility], [address], [place], [place] [address]   Folate     Status: None   Collection Time: 04/24/19  5:52 PM  Result Value Ref Range   Folate 26.0 >5.9 ng/mL    Comment: Performed at [facility], [address] [place], [place] [address]  Ammonia     Status: Abnormal   Collection Time: 04/24/19  5:52 PM  Result Value Ref Range   Ammonia <9 (L) 9 - 35 umol/L    Comment: Performed at [facility], [address] [place], [place] [address]  Glucose, capillary     Status: None   Collection Time: 04/24/19  9:05 PM  Result Value Ref Range   Glucose-Capillary 96 70 - 99 mg/dL  Basic metabolic panel     Status: Abnormal   Collection Time: 04/25/19  4:35 AM  Result Value Ref Range   Sodium 143 135 - 145 mmol/L   Potassium 3.6 3.5 - 5.1 mmol/L   Ch[place] 111 98 - 111 mmol/L   CO2 25 22 - 32 mmol/L   Glucose, Bld 120 (H) 70 - 99 mg/dL   BUN 31 (H) 8 - 23 mg/dL   Creatinine, Ser 2.58 (H) 0.61 - 1.24 mg/dL   Calcium 8.0 (L) 8.9 - 10.3 mg/dL  GFR calc non Af Amer 24 (L) >60 mL/min   GFR calc Af Amer 28 (L) >60 mL/min    Anion gap 7 5 - 15    Comment: Performed at [facility], [address] [place], [place] [address]  CBC     Status: Abnormal   Collection Time: 04/25/19  4:35 AM  Result Value Ref Range   WBC 5.0 4.0 - 10.5 K/uL   RBC 3.32 (L) 4.22 - 5.81 MIL/uL   Hemoglobin 9.6 (L) 13.0 - 17.0 g/dL   HCT 30.0 (L) 39.0 - 52.0 %   MCV 90.4 80.0 - 100.0 fL   MCH 28.9 26.0 - 34.0 pg   MCHC 32.0 30.0 - 36.0 g/dL   RDW 15.0 11.5 - 15.5 %   Platelets 104 (L) 150 - 400 K/uL    Comment: Immature Platelet Fraction may be clinically indicated, consider ordering this additional test [id number]    nRBC 0.0 0.0 - 0.2 %    Comment: Performed at [facility], [address] [place], [place] [address]  Glucose, capillary     Status: None   Collection Time: 04/25/19  7:58 AM  Result Value Ref Range   Glucose-Capillary 90 70 - 99 mg/dL  Glucose, capillary     Status: None   Collection Time: 04/25/19 11:34 AM  Result Value Ref Range   Glucose-Capillary 84 70 - 99 mg/dL    Current Facility-Administered Medications  Medication Dose Route Frequency Provider Last Rate Last Dose  . 0.9 %  sodium chloride infusion   Intravenous PRN Stark Jock, Jude, MD 10 mL/hr at 04/25/19 1213 250 mL at 04/25/19 1213  . acetaminophen (TYLENOL) tablet 650 mg  650 mg Oral Q6H PRN Mayo, Pete Pelt, MD       Or  . acetaminophen (TYLENOL) suppository 650 mg  650 mg Rectal Q6H PRN Mayo, Pete Pelt, MD      . aspirin EC tablet 81 mg  81 mg Oral Daily Mayo, Pete Pelt, MD      . atorvastatin (LIPITOR) tablet 40 mg  40 mg Oral QHS Mayo, Pete Pelt, MD      . cefTRIAXone (ROCEPHIN) 1 g in sodium chloride 0.9 % 100 mL IVPB  1 g Intravenous Q24H Ojie, Jude, MD 200 mL/hr at 04/25/19 1216 1 g at 04/25/19 1216  . cholecalciferol (VITAMIN D3) tablet 2,000 Units  2,000 Units Oral Daily Mayo, Pete Pelt, MD      . citalopram (CELEXA) tablet 20 mg  20 mg Oral Daily Mayo, Pete Pelt, MD      . clopidogrel (PLAVIX) tablet 75 mg  75 mg Oral  Daily Mayo, Pete Pelt, MD      . haloperidol lactate (HALDOL) injection 2 mg  2 mg Intravenous Q6H PRN Mayo, Pete Pelt, MD      . heparin injection 5,000 Units  5,000 Units Subcutaneous Q8H Mayo, Pete Pelt, MD   5,000 Units at 04/25/19 0535  . hydrALAZINE (APRESOLINE) injection 5 mg  5 mg Intravenous Q4H PRN Mayo, Pete Pelt, MD   5 mg at 04/25/19 0442  . insulin aspart (novoLOG) injection 0-5 Units  0-5 Units Subcutaneous QHS Mayo, Pete Pelt, MD      . insulin aspart (novoLOG) injection 0-9 Units  0-9 Units Subcutaneous TID WC Mayo, Pete Pelt, MD   1 Units at 04/24/19 1806  . labetalol (NORMODYNE) injection 10 mg  10 mg Intravenous Q4H PRN Mayo, Pete Pelt, MD   10 mg at 04/25/19 0545  .  levothyroxine (SYNTHROID, LEVOTHROID) injection 75 mcg  75 mcg Intravenous Daily Ojie, Jude, MD      . lipase/protease/amylase (CREON) capsule 48,000 Units  48,000 Units Oral TID WC Mayo, Pete Pelt, MD      . ondansetron [facility]) tablet 4 mg  4 mg Oral Q6H PRN Mayo, Pete Pelt, MD       Or  . ondansetron (ZOFRAN) injection 4 mg  4 mg Intravenous Q6H PRN Mayo, Pete Pelt, MD      . polyethylene glycol (MIRALAX / GLYCOLAX) packet 17 g  17 g Oral Daily PRN Mayo, Pete Pelt, MD      . sodium bicarbonate tablet 650 mg  650 mg Oral BID Mayo, Pete Pelt, MD      . valproate (DEPACON) 1,000 mg in dextrose 5 % 50 mL IVPB  1,000 mg Intravenous Once Laurene Footman, MD      . valproate (DEPACON) 338 mg in dextrose 5 % 50 mL IVPB  15 mg/kg/day Intravenous Elpidio Eric, MD        Musculoskeletal: Strength & Muscle Tone: decreased Gait & Station: did not witness Patient leans: N/A  Psychiatric Specialty Exam: Physical Exam  Nursing note and vitals reviewed. Constitutional: He appears well-developed. He appears lethargic.  HENT:  Head: Normocephalic.  Neck: Normal range of motion.  Respiratory: Effort normal.  Neurological: He appears lethargic.  Skin: Skin is dry.  Psychiatric: He has a normal mood and affect. Judgment and  thought content normal. His speech is slurred. He is slowed. Cognition and memory are impaired.    Review of Systems  Constitutional: Positive for malaise/fatigue.  Neurological: Positive for sensory change and weakness.  Psychiatric/Behavioral: Positive for memory loss.    Blood pressure (!) 149/72, pulse 70, temperature 98.2 F (36.8 C), temperature source Oral, resp. rate 18, height 5\' 11"  (1.803 m), weight 67.6 kg, SpO2 100 %.Body mass index is 20.78 kg/m.  General Appearance: Disheveled  Eye Contact:  Minimal  Speech:  Slow  Volume:  Decreased  Mood:  Euthymic  Affect:  Appropriate  Thought Process:  Disorganized  Orientation:  Other:  Time, not to place  Thought Content:  Negative  Suicidal Thoughts:  No  Homicidal Thoughts:  No  Memory:  Recent;   Poor  Judgement:  NA  Insight:  Lacking  Psychomotor Activity:  Decreased  Concentration:  Concentration: Poor  Recall:  Poor  Fund of Knowledge:  Poor  Language:  Poor  Akathisia:  No  Handed:  Right  AIMS (if indicated):     Assets:  Leisure Time Resilience Social Support  ADL's:  Impaired  Cognition:  Impaired,  Moderate  Sleep:   Pt slept well last night and has been drowsy today    Treatment Plan Summary: Altered mental status: -Continue Haldol 2 mg IV every six hours PRN -Avoid benzodiazepines  Depression: -Continue Celexa 20 mg daily  Suspect delirium as this was an acute change according to his daughter and has an underlying medical issues being treated with antibiotics  The patient's exam is notable for altered sensorium, perceptual disturbances, disorientation and cognitive deficits that appear markedly different than their baseline, suggesting a diagnosis of delirium.  Virtually any medical condition or physiologic stress can precipitate delirium in a susceptible individual, with risk increasing in those with: advanced age, sensory impairments, organic brain disease (stroke, dementia, Parkinsons),  psychiatric illness, major chronic medical issues, prolonged hospitalizations, postoperative status, anemia, insomnia/disturbed sleep, and severe pain. Addressing the underlying medical condition and institution  of preventative measures are recommended.  Delirium precautions - Minimize/avoid deliriogenic meds including: anticholinergic, opiates, benzodiazepines           - Maintain hydration, oxygenation, nutrition           - Limit use of restraints and catheters           - Normalize sleep patterns by minimizing nighttime noise, light and interruptions by     -Ensure sleep apnea treatment is provided overnight.             clustering care, opening blinds during the day           - Reorient the patient frequently, provide easily visible clock and calendar           - Provide sensory aids like glasses, hearing aids           - Encourage ambulation, regular activities and visitors to maintain cognitive stimulation   -Patient would benefit from having family members at bedside to reinforce his orientation.  Disposition: Supportive therapy provided about ongoing stressors.  Waylan Boga, NP 04/25/2019 1:13 PM

## 2019-04-25 NOTE — Progress Notes (Signed)
PT Cancellation Note  Patient Details Name: Cameron Adams MRN: [id number] DOB: 1951/09/29   Cancelled Treatment:    Reason Eval/Treat Not Completed: Other (comment).  PT consult received.  Chart reviewed.  Nurse reporting pt still not following commands but cleared PT to attempt PT evaluation.  Upon PT arrival to pt's room, sitter present and reporting pt currently "aggravated" and sitter attempting to calm pt.  Therapist assisted sitter with attempting to calm pt and nurse notified and came to pt's room.  Will re-attempt PT evaluation at a later date/time.  Leitha Bleak, PT 04/25/19, 4:06 PM [phone]

## 2019-04-25 NOTE — Progress Notes (Signed)
Patient has been lethargic and sleepy since the beginning of shift. Blood sugar 60. Paged Seals, NP order to give D50 whole amp, start D5 1/5 NS  At 50 ml/hr, hypoglycemic protocol. Patient unable to take anything p.o. d/t being lethargic and sleepy.

## 2019-04-25 NOTE — Progress Notes (Signed)
PT Cancellation Note  Patient Details Name: Cameron Adams MRN: [id number] DOB: [birth year]   Cancelled Treatment:    Reason Eval/Treat Not Completed: Other (comment) Chart reviewed and communicated with Nurse. Nurse states pt is confused and not able to follow commands this morning (pt not appropriate for PT evaluation). Will re-attempt later when pt is appropriate for participation in PT.    Sherrilyn Rist, SPT 04/25/2019, 10:27 AM

## 2019-04-26 LAB — BASIC METABOLIC PANEL
Anion gap: 6 (ref 5–15)
BUN: 31 mg/dL — ABNORMAL HIGH (ref 8–23)
CO2: 25 mmol/L (ref 22–32)
Calcium: 8 mg/dL — ABNORMAL LOW (ref 8.9–10.3)
Chloride: 111 mmol/L (ref 98–111)
Creatinine, Ser: 2.6 mg/dL — ABNORMAL HIGH (ref 0.61–1.24)
GFR calc Af Amer: 28 mL/min — ABNORMAL LOW (ref >60)
GFR calc non Af Amer: 24 mL/min — ABNORMAL LOW (ref >60)
Glucose, Bld: 152 mg/dL — ABNORMAL HIGH (ref 70–99)
Potassium: 4 mmol/L (ref 3.5–5.1)
Sodium: 142 mmol/L (ref 135–145)

## 2019-04-26 LAB — URINALYSIS, COMPLETE (UACMP) WITH MICROSCOPIC
Bacteria, UA: NONE SEEN
Bilirubin Urine: NEGATIVE
Glucose, UA: 50 mg/dL — AB
Ketones, ur: NEGATIVE mg/dL
Nitrite: NEGATIVE
Protein, ur: 100 mg/dL — AB
Specific Gravity, Urine: 1.011 (ref 1.005–1.030)
Squamous Epithelial / HPF: NONE SEEN (ref 0–5)
WBC, UA: >50 WBC/hpf — ABNORMAL HIGH (ref 0–5)
pH: 5 (ref 5.0–8.0)

## 2019-04-26 LAB — GLUCOSE, CAPILLARY
Glucose-Capillary: 121 mg/dL — ABNORMAL HIGH (ref 70–99)
Glucose-Capillary: 214 mg/dL — ABNORMAL HIGH (ref 70–99)
Glucose-Capillary: 203 mg/dL — ABNORMAL HIGH (ref 70–99)
Glucose-Capillary: 90 mg/dL (ref 70–99)

## 2019-04-26 LAB — URINE CULTURE: Culture: NO GROWTH

## 2019-04-26 LAB — CBC
HCT: 29.9 % — ABNORMAL LOW (ref 39.0–52.0)
Hemoglobin: 9.7 g/dL — ABNORMAL LOW (ref 13.0–17.0)
MCH: 29.7 pg (ref 26.0–34.0)
MCHC: 32.4 g/dL (ref 30.0–36.0)
MCV: 91.4 fL (ref 80.0–100.0)
Platelets: 108 10*3/uL — ABNORMAL LOW (ref 150–400)
RBC: 3.27 MIL/uL — ABNORMAL LOW (ref 4.22–5.81)
RDW: 15.5 % (ref 11.5–15.5)
WBC: 3.5 10*3/uL — ABNORMAL LOW (ref 4.0–10.5)
nRBC: 0 % (ref 0.0–0.2)

## 2019-04-26 LAB — RPR: RPR Ser Ql: NONREACTIVE

## 2019-04-26 LAB — HIV ANTIBODY (ROUTINE TESTING W REFLEX): HIV Screen 4th Generation wRfx: NONREACTIVE

## 2019-04-26 LAB — MAGNESIUM: Magnesium: 1.5 mg/dL — ABNORMAL LOW (ref 1.7–2.4)

## 2019-04-26 MED ORDER — MAGNESIUM SULFATE 2 GM/50ML IV SOLN
2.0000 g | Freq: Once | INTRAVENOUS | Status: AC
  Administered 2019-04-26: 2 g via INTRAVENOUS
  Filled 2019-04-26: qty 50

## 2019-04-26 MED ORDER — CLONIDINE HCL 0.1 MG PO TABS
0.2000 mg | ORAL_TABLET | ORAL | Status: AC
  Administered 2019-04-26: 0.2 mg via ORAL
  Filled 2019-04-26: qty 2

## 2019-04-26 MED ORDER — CLONIDINE HCL 0.1 MG PO TABS
0.2000 mg | ORAL_TABLET | ORAL | Status: DC | PRN
  Administered 2019-04-27: 0.2 mg via ORAL
  Filled 2019-04-26: qty 2

## 2019-04-26 MED ORDER — DIVALPROEX SODIUM ER 250 MG PO TB24
750.0000 mg | ORAL_TABLET | Freq: Every day | ORAL | Status: DC
  Administered 2019-04-26 – 2019-04-30 (×5): 750 mg via ORAL
  Filled 2019-04-26 (×5): qty 3

## 2019-04-26 MED ORDER — NITROGLYCERIN 2 % TD OINT: 2.0000 [in_us] | TOPICAL_OINTMENT | Freq: Four times a day (QID) | TRANSDERMAL | Status: DC

## 2019-04-26 MED ORDER — NITROGLYCERIN 2 % TD OINT
2.0000 [in_us] | TOPICAL_OINTMENT | TRANSDERMAL | Status: AC
  Administered 2019-04-26: 07:00:00 2 [in_us] via TOPICAL
  Filled 2019-04-26: qty 1

## 2019-04-26 NOTE — Evaluation (Signed)
Physical Therapy Evaluation Patient Details Name: Cameron Adams MRN: [id number] DOB: April 06, 1951 Today's Date: 04/26/2019   History of Present Illness  68 y.o. male patient admitted for treatment of an UTI and altered mental status. Admitted: 04/24/2019 PMH: significant for type 2 diabetes mellitus, CKD stage III, hypertension, MGUS, chronic diastolic CHF, othostatic hypotension and L eye blindness, and seizures. Image: No acute finding, chronic ischemic changes throughout brain.  Clinical Impression  Prior to hospital admission, pt needs assist with ADLs from daughter for bathing, dressing and cooking.  Pt lives in a 2 level house and in upstairs with 13 steps to the second floor.  Currently pt is +2 max assist semi-supine <> sitting on EOB. +1 max assist to maintain sitting balance with significant leaning towards right and posterior side. Vc's and tactile cues provided for UE positions for sitting balance and unable to follow commands. Pt would benefit from skilled PT to address noted impairments and functional limitations (see below for any additional details).  Upon hospital discharge, recommend pt discharge to SNF to improve strengthening, balance and ROM to increase functional mobility and ADLs.     Follow Up Recommendations SNF    Equipment Recommendations  Rolling walker with 5" wheels;Wheelchair cushion (measurements PT);Wheelchair (measurements PT)(Pt stats has a walker and cane at home)    Recommendations for Other Services OT consult     Precautions / Restrictions Restrictions Weight Bearing Restrictions: No      Mobility  Bed Mobility Overal bed mobility: Needs Assistance Bed Mobility: Supine to Sit     Supine to sit: Max assist;+2 for physical assistance Sit to supine: +2 for physical assistance;Max assist   General bed mobility comments: Vc's given but pt unable to follow commands. UE was not able to help with sitting with right and posterior leaning when sitting  EOB. Require 1 person max assist to maintain sitting balance. Return to supine from sitting after sitting 3 minutes due to pt fatigue showing increased torso leaing towards right and posterior side.  Transfers                 General transfer comment: Deffered d/t level of assist in sitting balance  Ambulation/Gait             General Gait Details: Deffered d/t level of assist in sitting balance  Stairs            Wheelchair Mobility    Modified Rankin (Stroke Patients Only)       Balance Overall balance assessment: Needs assistance Sitting-balance support: Feet unsupported;No upper extremity supported Sitting balance-Leahy Scale: Zero Sitting balance - Comments: require +1 max A to maintain sitting balance Postural control: Right lateral lean;Posterior lean                                   Pertinent Vitals/Pain Pain Assessment: Faces Faces Pain Scale: Hurts little more Pain Location: Neck Pain Intervention(s): Limited activity within patient's tolerance;Monitored during session;Repositioned;Other (comment)(Elevated bed aggrevated the neck pain and bed adjusted down to ease the neck pain)    Home Living Family/patient expects to be discharged to:: Private residence Living Arrangements: Children(daughter) Available Help at Discharge: Family Type of Home: House Home Access: Level entry     Home Layout: Two level;Bed/bath upstairs Home Equipment: Environmental consultant - 2 wheels;Cane - single point      Prior Function Level of Independence: Needs assistance  ADL's / Homemaking Assistance Needed: Daughter helps with cooking, dressing and bathing        Hand Dominance   Dominant Hand: Right    Extremity/Trunk Assessment   Upper Extremity Assessment Upper Extremity Assessment: Defer to OT evaluation    Lower Extremity Assessment Lower Extremity Assessment: Generalized weakness       Communication   Communication: Other  (comment)(very sensitive to loud voice; able to communicate better after bed mobility at the end of the session)  Cognition Arousal/Alertness: Lethargic Behavior During Therapy: WFL for tasks assessed/performed Overall Cognitive Status: Within Functional Limits for tasks assessed Area of Impairment: Orientation                 Orientation Level: Person;Place;Situation             General Comments: Able to orient to year, month but off days by two days      General Comments      Exercises     Assessment/Plan    PT Assessment Patient needs continued PT services  PT Problem List Decreased strength;Decreased range of motion;Decreased activity tolerance;Decreased balance;Decreased knowledge of use of DME;Decreased coordination;Decreased mobility;Decreased safety awareness;Pain       PT Treatment Interventions Therapeutic activities;Therapeutic exercise;Balance training;Functional mobility training;DME instruction;Gait training    PT Goals (Current goals can be found in the Care Plan section)  Acute Rehab PT Goals Patient Stated Goal: to go home PT Goal Formulation: With patient Time For Goal Achievement: 05/11/19 Potential to Achieve Goals: Fair    Frequency Min 2X/week   Barriers to discharge        Co-evaluation               AM-PAC PT "6 Clicks" Mobility  Outcome Measure Help needed turning from your back to your side while in a flat bed without using bedrails?: Total Help needed moving from lying on your back to sitting on the side of a flat bed without using bedrails?: Total Help needed moving to and from a bed to a chair (including a wheelchair)?: Total Help needed standing up from a chair using your arms (e.g., wheelchair or bedside chair)?: Total Help needed to walk in hospital room?: Total Help needed climbing 3-5 steps with a railing? : Total 6 Click Score: 6    End of Session   Activity Tolerance: Patient limited by fatigue;Patient limited  by lethargy Patient left: in bed;with bed alarm set;with nursing/sitter in room(feet elevated and floating off the pillow; bed is at the lowest at the end of the session) Nurse Communication: Mobility status;Precautions PT Visit Diagnosis: Other abnormalities of gait and mobility (R26.89);Muscle weakness (generalized) (M62.81);Difficulty in walking, not elsewhere classified (R26.2);Pain    Time: 7858-8502 PT Time Calculation (min) (ACUTE ONLY): 41 min   Charges:               Sherrilyn Rist, SPT 04/26/2019, 11:21 AM

## 2019-04-26 NOTE — TOC Initial Note (Deleted)
Transition of Care [facility]) - Initial/Assessment Note    Patient Details  Name: Cameron Adams MRN: [id number] Date of Birth: March 08, 1951  Transition of Care [facility]) CM/SW Contact:    Shade Flood, LCSW Phone Number: 04/26/2019, 5:11 PM  Clinical Narrative:                  Pt admitted from home. PT recommends SNF pt is refusing. Met with pt to assess. Pt is a retired Therapist, sports. She lives alone with her cat. Pt states that she does not want SNF. She has HH RN, PT, and aide from [facility] currently. Pt also has personal care services. She uses O2 at home. She has a Marketing executive and a shower bench. Pt has someone who drives her to appointments/shopping and she states that she does not have any difficulty obtaining medications.   Anticipating dc tomorrow. Pt says she will need EMS transport with O2.   TOC will follow up tomorrow.  Expected Discharge Plan: [place] Barriers to Discharge: Continued Medical Work up   Patient Goals and CMS Choice        Expected Discharge Plan and Services Expected Discharge Plan: [place] In-house Referral: Clinical Social Work     Living arrangements for the past 2 months: [place]                                      Prior Living Arrangements/Services Living arrangements for the past 2 months: Single Family Home Lives with:: Self, Pets Patient language and need for interpreter reviewed:: Yes Do you feel safe going back to the place where you live?: Yes      Need for Family Participation in Patient Care: No (Comment) Care giver support system in place?: Yes (comment) Current home services: Home RN, Homehealth aide Criminal Activity/Legal Involvement Pertinent to Current Situation/Hospitalization: No - Comment as needed  Activities of Daily Living Home Assistive Devices/Equipment: Wheelchair, Environmental consultant (specify type) ADL Screening (condition at time of admission) Patient's cognitive ability  adequate to safely complete daily activities?: No Is the patient deaf or have difficulty hearing?: No Does the patient have difficulty seeing, even when wearing glasses/contacts?: Yes Does the patient have difficulty concentrating, remembering, or making decisions?: Yes Patient able to express need for assistance with ADLs?: No Does the patient have difficulty dressing or bathing?: Yes Independently performs ADLs?: No Communication: Needs assistance Is this a change from baseline?: Change from baseline, expected to last >3 days Dressing (OT): Needs assistance Is this a change from baseline?: Pre-admission baseline Grooming: Needs assistance Is this a change from baseline?: Pre-admission baseline Feeding: Needs assistance Is this a change from baseline?: Change from baseline, expected to last >3 days Bathing: Needs assistance Is this a change from baseline?: Pre-admission baseline Toileting: Needs assistance Is this a change from baseline?: Pre-admission baseline In/Out Bed: Needs assistance Is this a change from baseline?: Pre-admission baseline Walks in Home: Needs assistance Is this a change from baseline?: Pre-admission baseline Does the patient have difficulty walking or climbing stairs?: Yes Weakness of Legs: Both Weakness of Arms/Hands: Both  Permission Sought/Granted                  Emotional Assessment Appearance:: Appears stated age Attitude/Demeanor/Rapport: Engaged Affect (typically observed): Pleasant Orientation: : Oriented to Self, Oriented to Place, Oriented to  Time, Oriented to Situation Alcohol / Substance  Use: Not Applicable Psych Involvement: No (comment)  Admission diagnosis:  Visual hallucination [R44.1] Confusion [R41.0] CKD (chronic kidney disease) stage 4, GFR 15-29 ml/min (HCC) [N18.4] Patient Active Problem List   Diagnosis Date Noted  . Confusion   . Visual hallucination   . Altered mental status 04/24/2019  . Syncope and collapse  01/12/2019  . Weakness   . AKI (acute kidney injury) ([place]) 12/04/2018  . Dizziness 06/22/2017  . Hyperkalemia 06/16/2017  . Esophageal dysmotilities   . Dysphagia   . Rib fractures 05/17/2017  . Hemothorax, right 05/17/2017  . Recurrent major depressive disorder, in partial remission ([place]) 05/13/2017  . Pure hypercholesterolemia 05/13/2017  . IDDM (insulin dependent diabetes mellitus) ([place]) 05/13/2017  . Hypothyroidism (acquired) 05/13/2017  . GAD (generalized anxiety disorder) 05/13/2017  . CKD (chronic kidney disease) stage 3, GFR 30-59 ml/min (HCC) 05/13/2017  . Toe gangrene ([place]) 09/16/2016  . PVD (peripheral vascular disease) ([place]) 09/16/2016  . Uncontrolled diabetes mellitus ([place]) 09/16/2016  . Hyponatremia 09/16/2016  . Hypokalemia 09/16/2016  . Essential hypertension, malignant 09/16/2016  . Abnormal angiogram 09/16/2016  . Gangrene ([place]) 09/13/2016  . Pruritus 03/26/2016  . Orthostatic hypotension 03/26/2016  . 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 Alcohol abuse 03/26/2016  . Hyperglycemia 03/23/2016  . Passive suicidal ideations 09/16/2014  . Dyspnea 11/09/2013  . CAP (community acquired pneumonia) 10/31/2013  . Chest pain 10/31/2013  . Chronic respiratory failure with hypoxia ([place]) 10/31/2013  . 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 Diabetic retinopathy ([place]) 06/16/2013  . Gynecomastia 04/18/2013   PCP:  [name], MD Pharmacy:   [id number] [place] [id number]: [phone]: [phone]    Social Determinants of Health (SDOH) Interventions    Readmission Risk Interventions Readmission Risk Prevention Plan 04/26/2019 01/16/2019  Transportation Screening Complete Complete  PCP or Specialist Appt within 3-5 Days - Complete  [place] or [place] [place]- Complete  Social Work Consult for R[place] Kom[place]Planning/Counseling - Complete  Palliative Care Screening  - Not Applicable  Medication Review (Press photographer Complete Complete  HRI or Home Care Consult Complete -  SW Recovery Care/Counseling Consult Complete -  Palliative Care Screening Not Applicable -  SLacledePatient Refused -  Some recent data might be hidden

## 2019-04-26 NOTE — TOC Initial Note (Signed)
Transition of Care [facility]) - Initial/Assessment Note    Patient Details  Name: Cameron Adams MRN: [id number] Date of Birth: May 10, 1951  Transition of Care [facility]) CM/SW Contact:    Shade Flood, LCSW Phone Number: 04/26/2019, 5:33 PM  Clinical Narrative:                  Pt admitted from home. [facility] states that they have been trying to send PT to the home and pt reportedly keeps putting them off saying he needs to delay it. Pt's PCP is asking for [facility] RN to be ordered along with PT if pt returns home at dc.    PT recommending SNF. Met with pt to assess. Pt states he is not going to SNF. Discussed pt's refusal of the [facility] services and pt stated, "I don't think I need them". Explained to pt that his primary doctor wants him to have PT and now also RN from [facility] in order to help keep him out of the hospital. Asked pt if he would please allow this follow up care and pt stated, "ok I will".   Pt states he and his daughter live together. Pt has a cane and a walker at home. He states that family drives him to appointments, shopping, etc. He states that he has been able to obtain medications without difficulty.  TOC will follow for dc planning needs.  Expected Discharge Plan: [place] Barriers to Discharge: Continued Medical Work up   Patient Goals and CMS Choice        Expected Discharge Plan and Services Expected Discharge Plan: [place] In-house Referral: Clinical Social Work     Living arrangements for the past 2 months: [place]                                      Prior Living Arrangements/Services Living arrangements for the past 2 months: Single Family Home Lives with:: Adult Children Patient language and need for interpreter reviewed:: Yes Do you feel safe going back to the place where you live?: Yes      Need for Family Participation in Patient Care: No (Comment) Care giver support system in place?: Yes  (comment) Current home services: Home PT Criminal Activity/Legal Involvement Pertinent to Current Situation/Hospitalization: No - Comment as needed  Activities of Daily Living Home Assistive Devices/Equipment: Wheelchair, Environmental consultant (specify type) ADL Screening (condition at time of admission) Patient's cognitive ability adequate to safely complete daily activities?: No Is the patient deaf or have difficulty hearing?: No Does the patient have difficulty seeing, even when wearing glasses/contacts?: Yes Does the patient have difficulty concentrating, remembering, or making decisions?: Yes Patient able to express need for assistance with ADLs?: No Does the patient have difficulty dressing or bathing?: Yes Independently performs ADLs?: No Communication: Needs assistance Is this a change from baseline?: Change from baseline, expected to last >3 days Dressing (OT): Needs assistance Is this a change from baseline?: Pre-admission baseline Grooming: Needs assistance Is this a change from baseline?: Pre-admission baseline Feeding: Needs assistance Is this a change from baseline?: Change from baseline, expected to last >3 days Bathing: Needs assistance Is this a change from baseline?: Pre-admission baseline Toileting: Needs assistance Is this a change from baseline?: Pre-admission baseline In/Out Bed: Needs assistance Is this a change from baseline?: Pre-admission baseline Walks in Home: Needs assistance Is this a  change from baseline?: Pre-admission baseline Does the patient have difficulty walking or climbing stairs?: Yes Weakness of Legs: Both Weakness of Arms/Hands: Both  Permission Sought/Granted                  Emotional Assessment Appearance:: Appears stated age Attitude/Demeanor/Rapport: Guarded, Avoidant Affect (typically observed): Blunt, Flat Orientation: : Oriented to Self, Oriented to Place, Oriented to  Time, Oriented to Situation Alcohol / Substance Use: Not  Applicable Psych Involvement: No (comment)  Admission diagnosis:  Visual hallucination [R44.1] Confusion [R41.0] CKD (chronic kidney disease) stage 4, GFR 15-29 ml/min (HCC) [N18.4] Patient Active Problem List   Diagnosis Date Noted  . Confusion   . Visual hallucination   . Altered mental status 04/24/2019  . Syncope and collapse 01/12/2019  . Weakness   . AKI (acute kidney injury) ([place]) 12/04/2018  . Dizziness 06/22/2017  . Hyperkalemia 06/16/2017  . Esophageal dysmotilities   . Dysphagia   . Rib fractures 05/17/2017  . Hemothorax, right 05/17/2017  . Recurrent major depressive disorder, in partial remission ([place]) 05/13/2017  . Pure hypercholesterolemia 05/13/2017  . IDDM (insulin dependent diabetes mellitus) ([place]) 05/13/2017  . Hypothyroidism (acquired) 05/13/2017  . GAD (generalized anxiety disorder) 05/13/2017  . CKD (chronic kidney disease) stage 3, GFR 30-59 ml/min (HCC) 05/13/2017  . Toe gangrene ([place]) 09/16/2016  . PVD (peripheral vascular disease) ([place]) 09/16/2016  . Uncontrolled diabetes mellitus ([place]) 09/16/2016  . Hyponatremia 09/16/2016  . Hypokalemia 09/16/2016  . Essential hypertension, malignant 09/16/2016  . Abnormal angiogram 09/16/2016  . Gangrene ([place]) 09/13/2016  . Pruritus 03/26/2016  . Orthostatic hypotension 03/26/2016  . 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 Alcohol abuse 03/26/2016  . Hyperglycemia 03/23/2016  . Passive suicidal ideations 09/16/2014  . Dyspnea 11/09/2013  . CAP (community acquired pneumonia) 10/31/2013  . Chest pain 10/31/2013  . Chronic respiratory failure with hypoxia ([place]) 10/31/2013  . 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 Diabetic retinopathy ([place]) 06/16/2013  . Gynecomastia 04/18/2013   PCP:  [name], MD Pharmacy:   [id number] [id number]: [phone]: [phone]    Social Determinants of Health  (SDOH) Interventions    Readmission Risk Interventions Readmission Risk Prevention Plan 04/26/2019 01/16/2019  Transportation Screening Complete Complete  PCP or Specialist Appt within 3-5 Days - Complete  HRI or [place]- Complete  Social Work Consult for [place]Planning/Counseling - Complete  Palliative Care Screening - Not Applicable  Medication Review (Press photographer Complete Complete  HRI or Home Care Consult Complete -  SW Recovery Care/Counseling Consult Complete -  Palliative Care Screening Not Applicable -  SHopedalePatient Refused -  Some recent data might be hidden

## 2019-04-26 NOTE — Procedures (Signed)
History: 68 year old male being evaluated for altered mental status  Sedation: None  Technique: This is a 21 channel routine scalp EEG performed at the bedside with bipolar and monopolar montages arranged in accordance to the international 10/20 system of electrode placement. One channel was dedicated to EKG recording.    Background: Significant portion of this EEG was obscured by muscle artifact, however during periods of relative quiescence, posterior dominant rhythm of 8 Hz can be seen.  There is mild admixed generalized irregular slow activity as well.  Sleep is not recorded.  No epileptiform discharges were seen.   Photic stimulation: Physiologic driving is not performed  EEG Abnormalities: 1) mild intrusion of irregular slow activity in the background  Clinical Interpretation: This EEG is consistent with a mild generalized nonspecific cerebral dysfunction (encephalopathy). There was no seizure or seizure predisposition recorded on this study. Please note that lack of epileptiform activity on EEG does not preclude the possibility of epilepsy.   Roland Rack, MD [facility] [phone]  If 7pm- 7am, please page neurology on call as listed in [place].

## 2019-04-26 NOTE — Progress Notes (Signed)
Neurology Follow-up Progress Note [name] at [facility]  Date of Admission: 04/24/2019   ASSESSMENT:  Cameron Adams is a 68 y.o. year old male with past medical history significant for type 2 diabetes mellitus, CKD stage III, hypertension, MGUS, chronic diastolic CHF and seizures on VPA (sees Dr. Manuella Ghazi) who presented to the ED last night with agitation altered mental status.  Patient reportedly has been hallucinating animals around his house.  Past few days prior to admission patient was noted by his daughter not to be able to feed himself.  He is fallen multiple times over the past month.  No reported seizure-like activity at home per daughter.  Patient's Depakote random level was therapeutic but on the lower range.  I have given a load of Depakote yesterday.  This morning, patient seems to be more alert and oriented.  He states that he is feeling better and is not hurting anywhere.  He is more cooperative this morning as well.  He states that he has been taking his Depakote every day as prescribed because his daughter gives it to him.  Given this information, I will increase his maintenance to 750 mg twice a day Depakote extended release (home dose was 500 mg twice a day). Routine EEG yesterday demonstrated no epileptiform discharges.    PLAN: - Can transition to Depakote ER 750 mg twice daily. Can stop IV Depakote after afternoon dose - Can check a trough level prior to tomorrow evening dose - Please call neurology for any further questions or concenrs     SUBJECTIVE:  No acute events overnight. Patient is doing a lot better this morning. Seems more cooperative.    OBJECTIVE: Vital Signs Temp:  [97.7 F (36.5 C)-98.4 F (36.9 C)] 97.8 F (36.6 C) (07/21 0611) Pulse Rate:  [65-75] 67 (07/21 0810) Resp:  [16-18] 16 (07/21 0611) BP: (106-245)/(63-107) 145/76 (07/21 0810) SpO2:  [98 %-99 %] 99 % (07/21 1324)   Physical Exam:  GENERAL:  No acute distress.   MENTAL STATUS  EXAM:    Orientation: Alert and oriented to person, place and time.  Memory: Cooperative, follows commands well.  Language: Speech is clear and language is normal.   CRANIAL NERVES:    CN 3,4,6 (EOM): Full extraocular eye movement without nystagmus.  CN 5 (Trigeminal): Facial sensation is normal, no weakness of masticatory muscles.  CN 7 (Facial): No facial weakness or asymmetry.  CN 8 (Auditory): Auditory acuity grossly normal.  CN 9,10 (Glossophar): The uvula is midline, the palate elevates symmetrically.  CN 11 (spinal access): Normal sternocleidomastoid and trapezius strength.  CN 12 (Hypoglossal): The tongue is midline. No atrophy or fasciculations.Marland Kitchen   MOTOR:  Muscle Strength: Strength - 4+ in the RHB. 5/5 in the LHB. Muscle Tone: Tone and muscle bulk are normal in the upper and lower extremities.   SENSATION:   Intact to light touch   GAIT: Deferred  Labs: I've reviewed the labs for today   Diagnostic Results: - MRI Brain w/o: No acute finding. 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 Small lacunar infarction in the left thalamus. 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 - Routine EEG: This EEG is consistent with a mild generalized nonspecific cerebral dysfunction (encephalopathy). There was no seizure or seizure predisposition recorded on this study. Please note that lack of epileptiform activity on EEG does not preclude the possibility of epilepsy

## 2019-04-26 NOTE — Progress Notes (Signed)
Made Seals, NP aware that patient has not urinated since this morning.  Bladder scan shows 556ml.  Verbal order to straight cath with a foley and if output is 500> leave foley in.  Clarise Cruz, BSN

## 2019-04-26 NOTE — Progress Notes (Signed)
Patients blood pressure was 245/107. Gave PRN Hydralazine and paged MD just as FYI. New orders were given for Clonidine 0.2 mg p.o. now and every 6 hrs PRN and Nitro Paste 2 inches now and every 6 hrs PRN both with same parameters of a systolic 469 or greater and or a diastolic 507 or greater.

## 2019-04-26 NOTE — Progress Notes (Signed)
[facility] NAME: Cameron Adams    MR#:  [id number]  DATE OF BIRTH:  Jun 15, 1951  SUBJECTIVE:  CHIEF COMPLAINT:   Chief Complaint  Patient presents with  . Altered Mental Status   No new complaint this morning.  Patient awake and alert this morning.  No fevers.    REVIEW OF SYSTEMS:  Review of Systems  Constitutional: Negative for chills and fever.  HENT: Negative for hearing loss and tinnitus.   Eyes: Negative for blurred vision and double vision.  Respiratory: Negative for cough and shortness of breath.   Cardiovascular: Negative for chest pain and palpitations.  Gastrointestinal: Negative for heartburn and nausea.  Genitourinary: Negative for dysuria.  Musculoskeletal: Negative for myalgias and neck pain.  Skin: Negative for itching and rash.  Neurological: Negative for dizziness and headaches.  Psychiatric/Behavioral: Negative for depression and hallucinations.     DRUG ALLERGIES:   Allergies  Allergen Reactions  . Gabapentin     Other reaction(s): Other (See Comments) Trouble sleeping    VITALS:  Blood pressure 101/72, pulse (!) 58, temperature 98.1 F (36.7 C), temperature source Oral, resp. rate 20, height 5\' 11"  (1.803 m), weight 67.6 kg, SpO2 99 %. PHYSICAL EXAMINATION:  Physical Exam  GENERAL:  68 y.o.-year-old patient lying in the bed,  . EYES: Pupils equal, round, reactive to light and accommodation. No scleral icterus. Extraocular muscles intact.  HEENT: Head atraumatic, normocephalic. Oropharynx and nasopharynx clear.  NECK:  Supple, no jugular venous distention. No thyroid enlargement, no tenderness.  LUNGS: Normal breath sounds bilaterally, no wheezing, rales,rhonchi or crepitation. No use of accessory muscles of respiration.  CARDIOVASCULAR: RRR, S1, S2 normal. No murmurs, rubs, or gallops.  ABDOMEN: Unable to examine abdomen due to agitation EXTREMITIES: No pedal edema, cyanosis, or clubbing.   NEUROLOGIC:  Generalized weakness with strength of 4/5.  Awake and alert  PSYCHIATRIC: Patient awake and alert this morning.  Oriented  SKIN: No obvious rash, lesion, or ulcer.   LABORATORY PANEL:  Male CBC Recent Labs  Lab 04/26/19 0634  WBC 3.5*  HGB 9.7*  HCT 29.9*  PLT 108*   ------------------------------------------------------------------------------------------------------------------ Chemistries  Recent Labs  Lab 04/24/19 1349  04/26/19 0634  NA 140   < > 142  K 3.7   < > 4.0  CL 108   < > 111  CO2 23   < > 25  GLUCOSE 232*   < > 152*  BUN 34*   < > 31*  CREATININE 2.85*   < > 2.60*  CALCIUM 7.8*   < > 8.0*  MG  --   --  1.5*  AST 27  --   --   ALT 14  --   --   ALKPHOS 65  --   --   BILITOT 0.5  --   --    < > = values in this interval not displayed.   RADIOLOGY:  No results found. ASSESSMENT AND PLAN:    Altered mental status/agitation. Also having hallucinations. Most likely due to urinary tract infection; significantly improved this morning -CT head unremarkable MRI of the brain with no acute findings.Patient seen by speech therapist.  Started on pured diet with thin liquids COVID test negative  Urinary tract infection Patient started empirically on IV Rocephin pending results of urine culture and sensitivities.  So far no growth on urine cultures  History of seizures Patient started on IV Depakote by neurology recently and now  transitioned to p.o. Depakote.  Follow-up on Depakote level in a.m Patient seen by neurologist.  Appreciate input.    EEG done consistent with mild generalized nonspecific cerebral dysfunction.  No focal seizure or seizure predisposition noted on this study. 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 Management plans discussed with the patient, family and they are in agreement. Called and updated patient's daughter Ms. [name] recently on treatment plans as outlined above.  All questions were answered and she is in agreement to the plan of care.   CODE STATUS: Full Code  TOTAL TIME TAKING CARE OF THIS PATIENT: 38 minutes.   More than 50% of the time was spent in counseling/coordination of care: YES  POSSIBLE D/C IN 2 DAYS, DEPENDING ON CLINICAL CONDITION.   Cameron Adams M.D on 04/26/2019 at 3:53 PM  Between 7am to 6pm - Pager - [phone]  After 6pm go to [email or web address] - Proofreader  [facility]  Office  [phone]  CC: Primary care physician; [name], MD  Note: This dictation was prepared with Dragon dictation along with smaller phrase technology. Any transcriptional errors that result from this process are unintentional.

## 2019-04-26 NOTE — Consult Note (Addendum)
Patient has remained lethargic most of the day.  He was unable to be aroused when I was in her friend to speak with me.  Spoke with RN who is been taking care of the patient today and she stated that the patient has slept most of the day.  She stated that the only time that he was up and alert was when physical therapy came in and messed with him early this morning and he ate breakfast and then went right back to sleep and he slept almost all day long.  She states that he has been calm but sleeping and has been able to assist whether he is having any type of hallucinations or not at this time.  We will continue to follow patient and evaluate when patient is alert.

## 2019-04-26 NOTE — Progress Notes (Signed)
MD ordered for patient to have straight cath with Foley and if patient has 400 cc or more of urine leave Foley in place patient had 600 cc urine.

## 2019-04-27 ENCOUNTER — Telehealth: Payer: Self-pay | Admitting: Urology

## 2019-04-27 DIAGNOSIS — R441 Visual hallucinations: Secondary | ICD-10-CM

## 2019-04-27 LAB — VALPROIC ACID LEVEL: Valproic Acid Lvl: 67 ug/mL (ref 50.0–100.0)

## 2019-04-27 LAB — BASIC METABOLIC PANEL
Anion gap: 5 (ref 5–15)
BUN: 34 mg/dL — ABNORMAL HIGH (ref 8–23)
CO2: 26 mmol/L (ref 22–32)
Calcium: 7.9 mg/dL — ABNORMAL LOW (ref 8.9–10.3)
Chloride: 112 mmol/L — ABNORMAL HIGH (ref 98–111)
Creatinine, Ser: 2.68 mg/dL — ABNORMAL HIGH (ref 0.61–1.24)
GFR calc Af Amer: 27 mL/min — ABNORMAL LOW (ref >60)
GFR calc non Af Amer: 23 mL/min — ABNORMAL LOW (ref >60)
Glucose, Bld: 111 mg/dL — ABNORMAL HIGH (ref 70–99)
Potassium: 4.3 mmol/L (ref 3.5–5.1)
Sodium: 143 mmol/L (ref 135–145)

## 2019-04-27 LAB — GLUCOSE, CAPILLARY
Glucose-Capillary: 111 mg/dL — ABNORMAL HIGH (ref 70–99)
Glucose-Capillary: 171 mg/dL — ABNORMAL HIGH (ref 70–99)
Glucose-Capillary: 251 mg/dL — ABNORMAL HIGH (ref 70–99)
Glucose-Capillary: 203 mg/dL — ABNORMAL HIGH (ref 70–99)

## 2019-04-27 LAB — MAGNESIUM: Magnesium: 2 mg/dL (ref 1.7–2.4)

## 2019-04-27 MED ORDER — LEVOTHYROXINE SODIUM 100 MCG PO TABS
100.0000 ug | ORAL_TABLET | Freq: Every day | ORAL | Status: DC
  Administered 2019-04-28 – 2019-04-30 (×3): 100 ug via ORAL
  Filled 2019-04-27 (×3): qty 1

## 2019-04-27 MED ORDER — TAMSULOSIN HCL 0.4 MG PO CAPS
0.4000 mg | ORAL_CAPSULE | Freq: Every day | ORAL | Status: DC
  Administered 2019-04-27 – 2019-04-30 (×4): 0.4 mg via ORAL
  Filled 2019-04-27 (×4): qty 1

## 2019-04-27 NOTE — Plan of Care (Signed)

## 2019-04-27 NOTE — Progress Notes (Signed)
Made provider aware of UA results still waiting on culture to result.

## 2019-04-27 NOTE — Consult Note (Signed)
[facility] Consult   Reason for Consult:  Agitation and hallucinations Referring Physician:  Hospitalists Patient Identification: Cameron Adams MRN:  [id number] Principal Diagnosis: <principal problem not specified> Diagnosis:  Active Problems:   Altered mental status   Confusion   Visual hallucination   Total Time spent with patient: 20 minutes  Subjective:   Cameron Adams is a 68 y.o. male patient reports that he is feeling much better today.  Patient denies any suicidal or homicidal ideations and denies any hallucinations.  Patient reports that he is ready to go home and be with his grandchildren.  He reports that he has 5 children and 6 grandchildren that he spends a lot of time with.  Patient is able to tell me his name, date of birth, year, month, and the president.  Patient is unable to tell me the previous president and mentions Ida Rogue, however, this is a significant improvement and when I saw the patient yesterday and previous reports.  HPI:  Per Dr. Gale Journey: 68 y.o. year old male with past medical history significant for type 2 diabetes mellitus, CKD stage III, hypertension, MGUS, chronic diastolic CHFand seizures on VPA (sees Dr. Lorne Skeens presented to the ED last night with agitation altered mental status.  Patient reportedly has been hallucinating animals around his house.  Past few days prior to admission patient was noted by his daughter not to be able to feed himself.  He is fallen multiple times over the past month.  No reported seizure-like activity at home per daughter.  Patient's Depakote random level was therapeutic but on the lower range.  I have given a load of Depakote yesterday.  This morning, patient seems to be more alert and oriented.  He states that he is feeling better and is not hurting anywhere.  He is more cooperative this morning as well.  He states that he has been taking his Depakote every day as prescribed because his daughter gives  it to him.  Given this information, I will increase his maintenance to 750 mg twice a day Depakote extended release (home dose was 500 mg twice a day). Routine EEG yesterday demonstrated no epileptiform discharges.   Patient is seen by me via face-to-face and I have consulted with Dr. Henrene Pastor.  Patient felt significant improvement and is denying any suicidal homicidal ideations and denies any hallucinations.  Nursing staff report the patient has been very cooperative and there have been no issues with the patient on the floor today.  It appears that they are still treating the patient for UTI and is continue to get antibiotics at this time.  Feel that the medications the patient is on is working well and would recommend continuation of current medications.  I am sending secure chat to Dr. Nathaniel Man notified him of the recommendations.  Past Psychiatric History: None reported  Risk to Self:   Risk to Others:   Prior Inpatient Therapy:   Prior Outpatient Therapy:    Past Medical History:  Past Medical History:  Diagnosis Date  . Anemia of chronic disease   . Blindness of left eye   . Chronic kidney disease, stage III (moderate) (HCC)   . Diabetes ([place])   . Diarrhea   . Diastolic dysfunction    a. 06/2017 Echo: EF 55-60%, no rwma, Gr2 DD, Asc Ao 3.9cm, nl RV fxn, nl PASP.  Marland Kitchen Hypertension   . Hypothyroidism   . Monoclonal gammopathy of unknown significance (MGUS)    a. prev seen @  [facility].  Lost to f/u - never had BM bx.  . Orthostatic hypotension   . Syncope and collapse     Past Surgical History:  Procedure Laterality Date  . 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 PERIPHERAL VASCULAR CATHETERIZATION N/A 09/15/2016   Procedure: Lower Extremity Angiography;  Surgeon: Algernon Huxley, MD;  Location: [facility];  Service: Cardiovascular;  Laterality: N/A;  . THYROID SURGERY      Family History:  Family History  Problem Relation Age of Onset  . Kidney failure Mother   . Heart disease Father   . Kidney failure Father   . Heart disease Brother    Family Psychiatric  History: None reported Social History:  Social History   Substance and Sexual Activity  Alcohol Use Not Currently   Comment: previously drank heavily but not in many years     Social History   Substance and Sexual Activity  Drug Use No    Social History   Socioeconomic History  . Marital status: Single    Spouse name: Not on file  . Number of children: Not on file  . Years of education: Not on file  . Highest education level: Not on file  Occupational History  . Occupation: Retired  Scientific laboratory technician  . Financial resource strain: Hard  . Food insecurity    Worry: Not on file    Inability: Not on file  . Transportation needs    Medical: No    Non-medical: No  Tobacco Use  . Smoking status: Former Smoker    Packs/day: 0.10    Years: 25.00    Pack years: 2.50    Types: Cigarettes    Quit date: 10/31/1978    Years since quitting: 40.5  . Smokeless tobacco: Never Used  . Tobacco comment: smoked 2-3 cigarettes/day  Substance and Sexual Activity  . Alcohol use: Not Currently    Comment: previously drank heavily but not in many years  . Drug use: No  . Sexual activity: Not on file  Lifestyle  . Physical activity    Days per week: 3 days    Minutes per session: 20 min  . Stress: Not on file  Relationships  . Social Herbalist on phone: Not on file    Gets together: Not on file    Attends religious service: Not on file    Active member of club or organization: Not on file    Attends meetings of clubs or organizations: Not on file    Relationship status: Not on file  Other Topics Concern  . Not on file  Social History Narrative   Lives in [place] by himself.  Says he is very active at home but does not routinely exercise.  Usual diet is poor - some days he might only  drink a few cups of coffee.   Additional Social History:    Allergies:   Allergies  Allergen Reactions  . Gabapentin     Other reaction(s): Other (See Comments) Trouble sleeping     Labs:  Results for orders placed or performed during the hospital encounter of 04/24/19 (from the past 48 hour(s))  Glucose, capillary     Status: Abnormal   Collection Time: 04/25/19  4:39 PM  Result Value Ref Range   Glucose-Capillary 130 (H) 70 - 99 mg/dL   Comment 1 Notify RN  Comment 2 Document in Chart   Glucose, capillary     Status: Abnormal   Collection Time: 04/25/19  9:33 PM  Result Value Ref Range   Glucose-Capillary 60 (L) 70 - 99 mg/dL  Glucose, capillary     Status: Abnormal   Collection Time: 04/25/19 10:26 PM  Result Value Ref Range   Glucose-Capillary 200 (H) 70 - 99 mg/dL  Basic metabolic panel     Status: Abnormal   Collection Time: 04/26/19  6:34 AM  Result Value Ref Range   Sodium 142 135 - 145 mmol/L   Potassium 4.0 3.5 - 5.1 mmol/L   Chloride 111 98 - 111 mmol/L   CO2 25 22 - 32 mmol/L   Glucose, Bld 152 (H) 70 - 99 mg/dL   BUN 31 (H) 8 - 23 mg/dL   Creatinine, Ser 2.60 (H) 0.61 - 1.24 mg/dL   Calcium 8.0 (L) 8.9 - 10.3 mg/dL   GFR calc non Af Amer 24 (L) >60 mL/min   GFR calc Af Amer 28 (L) >60 mL/min   Anion gap 6 5 - 15    Comment: Performed at [facility], [address] [place], [place] [address]  CBC     Status: Abnormal   Collection Time: 04/26/19  6:34 AM  Result Value Ref Range   WBC 3.5 (L) 4.0 - 10.5 K/uL   RBC 3.27 (L) 4.22 - 5.81 MIL/uL   Hemoglobin 9.7 (L) 13.0 - 17.0 g/dL   HCT 29.9 (L) 39.0 - 52.0 %   MCV 91.4 80.0 - 100.0 fL   MCH 29.7 26.0 - 34.0 pg   MCHC 32.4 30.0 - 36.0 g/dL   RDW 15.5 11.5 - 15.5 %   Platelets 108 (L) 150 - 400 K/uL    Comment: Immature Platelet Fraction may be clinically indicated, consider ordering this additional test [id number]    nRBC 0.0 0.0 - 0.2 %    Comment: Performed at [facility],  [address] [place], [place] [address]  Magnesium     Status: Abnormal   Collection Time: 04/26/19  6:34 AM  Result Value Ref Range   Magnesium 1.5 (L) 1.7 - 2.4 mg/dL    Comment: Performed at [facility], [address] [place], [place] [address]  Glucose, capillary     Status: Abnormal   Collection Time: 04/26/19  7:44 AM  Result Value Ref Range   Glucose-Capillary 121 (H) 70 - 99 mg/dL  Glucose, capillary     Status: Abnormal   Collection Time: 04/26/19 11:53 AM  Result Value Ref Range   Glucose-Capillary 214 (H) 70 - 99 mg/dL  Glucose, capillary     Status: Abnormal   Collection Time: 04/26/19  4:46 PM  Result Value Ref Range   Glucose-Capillary 203 (H) 70 - 99 mg/dL  Urinalysis, Complete w Microscopic     Status: Abnormal   Collection Time: 04/26/19  8:20 PM  Result Value Ref Range   Color, Urine YELLOW (A) YELLOW   APPearance CLOUDY (A) CLEAR   Specific Gravity, Urine 1.011 1.005 - 1.030   pH 5.0 5.0 - 8.0   Glucose, UA 50 (A) NEGATIVE mg/dL   Hgb urine dipstick MODERATE (A) NEGATIVE   Bilirubin Urine NEGATIVE NEGATIVE   Ketones, ur NEGATIVE NEGATIVE mg/dL   Protein, ur 100 (A) NEGATIVE mg/dL   Nitrite NEGATIVE NEGATIVE   Leukocytes,Ua LARGE (A) NEGATIVE   RBC / HPF 11-20 0 - 5 RBC/hpf   WBC, UA >50 (  H) 0 - 5 WBC/hpf   Bacteria, UA NONE SEEN NONE SEEN   Squamous Epithelial / LPF NONE SEEN 0 - 5   WBC Clumps PRESENT    Budding Yeast PRESENT    Hyaline Casts, UA PRESENT     Comment: Performed at [facility], [address] [place], [place] [address]  Glucose, capillary     Status: None   Collection Time: 04/26/19  9:40 PM  Result Value Ref Range   Glucose-Capillary 90 70 - 99 mg/dL  Basic metabolic panel     Status: Abnormal   Collection Time: 04/27/19  5:09 AM  Result Value Ref Range   Sodium 143 135 - 145 mmol/L   Potassium 4.3 3.5 - 5.1 mmol/L   Chloride 112 (H) 98 - 111 mmol/L   CO2 26 22 - 32 mmol/L   Glucose, Bld 111 (H) 70 -  99 mg/dL   BUN 34 (H) 8 - 23 mg/dL   Creatinine, Ser 2.68 (H) 0.61 - 1.24 mg/dL   Calcium 7.9 (L) 8.9 - 10.3 mg/dL   GFR calc non Af Amer 23 (L) >60 mL/min   GFR calc Af Amer 27 (L) >60 mL/min   Anion gap 5 5 - 15    Comment: Performed at [facility], [address] [place], [place] [address]  Magnesium     Status: None   Collection Time: 04/27/19  5:09 AM  Result Value Ref Range   Magnesium 2.0 1.7 - 2.4 mg/dL    Comment: Performed at [facility], [address] [place], [place] [address]  Valproic acid level     Status: None   Collection Time: 04/27/19  5:09 AM  Result Value Ref Range   Valproic Acid Lvl 67 50.0 - 100.0 ug/mL    Comment: Performed at [facility], [address] [place],  [address]  Glucose, capillary     Status: Abnormal   Collection Time: 04/27/19  8:00 AM  Result Value Ref Range   Glucose-Capillary 111 (H) 70 - 99 mg/dL   Comment 1 Notify RN    Comment 2 Document in Chart   Glucose, capillary     Status: Abnormal   Collection Time: 04/27/19 11:30 AM  Result Value Ref Range   Glucose-Capillary 171 (H) 70 - 99 mg/dL   Comment 1 Notify RN    Comment 2 Document in Chart     Current Facility-Administered Medications  Medication Dose Route Frequency Provider Last Rate Last Dose  . 0.9 %  sodium chloride infusion   Intravenous PRN Stark Jock Jude, MD   Stopped at 04/25/19 1917  . acetaminophen (TYLENOL) tablet 650 mg  650 mg Oral Q6H PRN Sela Hua, MD   650 mg at 04/26/19 0944   Or  . acetaminophen (TYLENOL) suppository 650 mg  650 mg Rectal Q6H PRN Mayo, Pete Pelt, MD      . aspirin EC tablet 81 mg  81 mg Oral Daily Mayo, Pete Pelt, MD   81 mg at 04/27/19 1010  . atorvastatin (LIPITOR) tablet 40 mg  40 mg Oral QHS Sela Hua, MD   40 mg at 04/26/19 2034  . cefTRIAXone (ROCEPHIN) 1 g in sodium chloride 0.9 % 100 mL IVPB  1 g Intravenous Q24H Ojie, Jude, MD 200 mL/hr at 04/27/19 1016 1 g at 04/27/19 1016  .  cholecalciferol (VITAMIN D3) tablet 2,000 Units  2,000 Units Oral Daily Mayo, Pete Pelt, MD   2,000 Units at  04/27/19 1010  . citalopram (CELEXA) tablet 20 mg  20 mg Oral Daily Mayo, Pete Pelt, MD   20 mg at 04/26/19 0944  . cloNIDine (CATAPRES) tablet 0.2 mg  0.2 mg Oral PRN Mansy, Jan A, MD   0.2 mg at 04/27/19 1011  . clopidogrel (PLAVIX) tablet 75 mg  75 mg Oral Daily Mayo, Pete Pelt, MD   75 mg at 04/26/19 0943  . dextrose 5 %-0.9 % sodium chloride infusion   Intravenous Continuous Seals, Theo Dills, NP 50 mL/hr at 04/27/19 0311    . divalproex (DEPAKOTE ER) 24 hr tablet 750 mg  750 mg Oral Daily Vu, Talbert Cage, MD   750 mg at 04/27/19 1011  . haloperidol lactate (HALDOL) injection 2 mg  2 mg Intravenous Q6H PRN Mayo, Pete Pelt, MD      . heparin injection 5,000 Units  5,000 Units Subcutaneous Q8H Mayo, Pete Pelt, MD   5,000 Units at 04/27/19 0522  . hydrALAZINE (APRESOLINE) injection 5 mg  5 mg Intravenous Q4H PRN Mayo, Pete Pelt, MD   5 mg at 04/26/19 9628  . insulin aspart (novoLOG) injection 0-5 Units  0-5 Units Subcutaneous QHS Mayo, Pete Pelt, MD      . insulin aspart (novoLOG) injection 0-9 Units  0-9 Units Subcutaneous TID WC Mayo, Pete Pelt, MD   3 Units at 04/26/19 1719  . labetalol (NORMODYNE) injection 10 mg  10 mg Intravenous Q4H PRN Sela Hua, MD   10 mg at 04/25/19 0545  . levothyroxine (SYNTHROID, LEVOTHROID) injection 75 mcg  75 mcg Intravenous Daily Stark Jock, Jude, MD   75 mcg at 04/27/19 0521  . lipase/protease/amylase (CREON) capsule 48,000 Units  48,000 Units Oral TID WC Mayo, Pete Pelt, MD   48,000 Units at 04/27/19 1009  . ondansetron (ZOFRAN) tablet 4 mg  4 mg Oral Q6H PRN Mayo, Pete Pelt, MD       Or  . ondansetron [facility]) injection 4 mg  4 mg Intravenous Q6H PRN Mayo, Pete Pelt, MD      . polyethylene glycol (MIRALAX / GLYCOLAX) packet 17 g  17 g Oral Daily PRN Mayo, Pete Pelt, MD      . sodium bicarbonate tablet 650 mg  650 mg Oral BID Sela Hua, MD   650 mg at  04/27/19 1011  . tamsulosin (FLOMAX) capsule 0.4 mg  0.4 mg Oral Daily Ojie, Jude, MD        Musculoskeletal: Strength & Muscle Tone: decreased Gait & Station: Patient remained in bed during evaluation Patient leans: N/A  Psychiatric Specialty Exam: Physical Exam  Nursing note and vitals reviewed. Constitutional: He is oriented to person, place, and time. He appears well-developed and well-nourished.  Cardiovascular: Normal rate.  Respiratory: Effort normal.  Neurological: He is alert and oriented to person, place, and time.    Review of Systems  Constitutional: Negative.   HENT: Negative.   Eyes: Negative.   Respiratory: Negative.   Cardiovascular: Negative.   Gastrointestinal: Negative.   Genitourinary: Negative.   Musculoskeletal: Negative.   Skin: Negative.   Neurological: Negative.   Endo/Heme/Allergies: Negative.   Psychiatric/Behavioral: Positive for memory loss.    Blood pressure 108/65, pulse 62, temperature 98 F (36.7 C), temperature source Oral, resp. rate 20, height 5\' 11"  (1.803 m), weight 67.6 kg, SpO2 100 %.Body mass index is 20.78 kg/m.  General Appearance: Casual  Eye Contact:  Fair  Speech:  Clear and Coherent and Slow  Volume:  Decreased  Mood:  Euthymic  Affect:  Congruent  Thought Process:  Coherent and Descriptions of Associations: Intact  Orientation:  Full (Time, Place, and Person)  Thought Content:  WDL  Suicidal Thoughts:  No  Homicidal Thoughts:  No  Memory:  Immediate;   Good Recent;   Fair Remote;   Poor  Judgement:  Fair  Insight:  Fair  Psychomotor Activity:  Normal  Concentration:  Concentration: Good  Recall:  Good  Fund of Knowledge:  Fair  Language:  Fair  Akathisia:  No  Handed:  Right  AIMS (if indicated):     Assets:  Desire for Improvement Financial Resources/Insurance Housing Resilience Social Support  ADL's:  Impaired  Cognition:  Impaired,  Mild  Sleep:        Treatment Plan Summary: Continue Celexa 20 mg  PO Daily  Continue Depakote ER 750 mg PO Daily (Valproic acid level 67 on 04/27/19) Psychiatry signing off with stabilization of patient. Please re-consult if needed   Disposition: No evidence of imminent risk to self or others at present.   Patient does not meet criteria for psychiatric inpatient admission. Discussed crisis plan, support from social network, calling 911, coming to the Emergency Department, and calling Suicide Hotline.  [place], FNP 04/27/2019 11:57 AM

## 2019-04-27 NOTE — Progress Notes (Signed)
  Speech Language Pathology Treatment: Dysphagia  Patient Details Name: TIERRA DIVELBISS MRN: [id number] DOB: [birth year] Today's Date: 04/27/2019 Time: 1035-1100 SLP Time Calculation (min) (ACUTE ONLY): 25 min  Assessment / Plan / Recommendation Clinical Impression  Pt continues to present with a minimal risk for aspiration. Pt was directly observed with po trials of dysphagia 2 and puree solids. Pt was observed with thin liquids via cup and straw. Pt was noted to continue to have oral deficits of impaired mastication and increased a to p transit time. Pt was noted to have no overt ssx aspiration during intake of solids or liquids. Pt stated that he enjoyed the dysphagia 2 solids and did not want puree solids. ST educated pt on diet recommendation to upgrade to semi soft solids, dysphagia 2 with thin liquids. Pt was educated to alternate solids and liquids. Pt continues to need assistance to intake current diet adequately. ST increased diet to Dysphagia 2 with thin liquids. RN notified and gave verbal agreement. Order placed. ST will continue to follow up.          SLP Plan  Continue with current plan of care       Recommendations  Diet recommendations: Dysphagia 2 (fine chop);Thin liquid Liquids provided via: Straw;Cup Medication Administration: Crushed with puree Supervision: Staff to assist with self feeding Compensations: Slow rate;Small sips/bites;Follow solids with liquid Postural Changes and/or Swallow Maneuvers: Seated upright 90 degrees                SLP Visit Diagnosis: Dysphagia, oropharyngeal phase (R13.12) Plan: Continue with current plan of care       GO                [place] 04/27/2019, 11:30 AM

## 2019-04-27 NOTE — Progress Notes (Signed)
[facility] [place] NAME: Cameron Adams    MR#:  [id number]  DATE OF BIRTH:  [birth year]  SUBJECTIVE:  CHIEF COMPLAINT:   Chief Complaint  Patient presents with  . Altered Mental Status   Patient reported to have developed acute urinary retention last night.  Foley catheter placed with 600 cc of urine output.  Patient started on Flomax this morning and case discussed with urologist.  No fevers overnight.  REVIEW OF SYSTEMS:  Review of Systems  Constitutional: Negative for chills and fever.  HENT: Negative for hearing loss and tinnitus.   Eyes: Negative for blurred vision and double vision.  Respiratory: Negative for cough and shortness of breath.   Cardiovascular: Negative for chest pain and palpitations.  Gastrointestinal: Negative for heartburn and nausea.  Genitourinary: Negative for dysuria.  Musculoskeletal: Negative for myalgias and neck pain.  Skin: Negative for itching and rash.  Neurological: Negative for dizziness and headaches.  Psychiatric/Behavioral: Negative for depression and hallucinations.     DRUG ALLERGIES:   Allergies  Allergen Reactions  . Gabapentin     Other reaction(s): Other (See Comments) Trouble sleeping    VITALS:  Blood pressure 108/65, pulse 62, temperature 98 F (36.7 C), temperature source Oral, resp. rate 20, height 5\' 11"  (1.803 m), weight 67.6 kg, SpO2 100 %. PHYSICAL EXAMINATION:  Physical Exam  GENERAL:  68 y.o.-year-old patient lying in the bed,  . EYES: Pupils equal, round, reactive to light and accommodation. No scleral icterus. Extraocular muscles intact.  HEENT: Head atraumatic, normocephalic. Oropharynx and nasopharynx clear.  NECK:  Supple, no jugular venous distention. No thyroid enlargement, no tenderness.  LUNGS: Normal breath sounds bilaterally, no wheezing, rales,rhonchi or crepitation. No use of accessory muscles of respiration.  CARDIOVASCULAR: RRR, S1, S2 normal. No  murmurs, rubs, or gallops.  ABDOMEN: Unable to examine abdomen due to agitation GU; has Foley catheter in place EXTREMITIES: No pedal edema, cyanosis, or clubbing.  NEUROLOGIC:  Generalized weakness with strength of 4/5.  Awake and alert  PSYCHIATRIC: Patient awake and alert this morning.  Oriented  SKIN: No obvious rash, lesion, or ulcer.   LABORATORY PANEL:  Male CBC Recent Labs  Lab 04/26/19 0634  WBC 3.5*  HGB 9.7*  HCT 29.9*  PLT 108*   ------------------------------------------------------------------------------------------------------------------ Chemistries  Recent Labs  Lab 04/24/19 1349  04/27/19 0509  NA 140   < > 143  K 3.7   < > 4.3  CL 108   < > 112*  CO2 23   < > 26  GLUCOSE 232*   < > 111*  BUN 34*   < > 34*  CREATININE 2.85*   < > 2.68*  CALCIUM 7.8*   < > 7.9*  MG  --    < > 2.0  AST 27  --   --   ALT 14  --   --   ALKPHOS 65  --   --   BILITOT 0.5  --   --    < > = values in this interval not displayed.   RADIOLOGY:  No results found. ASSESSMENT AND PLAN:    Altered mental status/agitation. Also having hallucinations. Most likely due to urinary tract infection; significantly improved this morning -CT head unremarkable MRI of the brain with no acute findings.Patient seen by speech therapist.  Started on pured diet with thin liquids COVID test negative Patient also evaluated by psychiatry service.  Recommendation is to continue current regimen and  psychiatry service signed off  Urinary tract infection Patient started empirically on IV Rocephin pending results of urine culture and sensitivities.  So far no growth on urine cultures  Acute urinary retention. Foley catheter placed with 600 cc of urine output last night.  Flomax started. Discussed case with urologist Dr. Erlene Quan.  Recommendation is to arrange for outpatient follow-up with urology clinic next week to establish care and for voiding trial.  For AM appointment Patient's daughter  concerned about care of Foley catheter.  Daughter to come into the hospital tomorrow to be educated by nursing staff on care for Foley catheter prior to discharge home  History of seizures Patient started on IV Depakote by neurology recently and now transitioned to p.o. Depakote.  Depakote level of 67.Patient seen by neurologist.  Appreciate input.    EEG done consistent with mild generalized nonspecific cerebral dysfunction.  No focal seizure or seizure predisposition noted on this study. 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  Skilled nursing facility placement recommended but patient declined.  Wishes to be discharged home with home health services on discharge tomorrow.  DVT prophylaxis; heparin  All the records are reviewed and case discussed with Care Management/Social Worker. Management plans discussed with the patient, family and they are in agreement. Called and updated patient's daughter Ms. Latonya this morning on treatment plans as outlined above.  All questions were answered and she is in agreement to the plan of care.   CODE STATUS: Full Code  TOTAL TIME TAKING CARE OF THIS PATIENT: 37 minutes.   More than 50% of the time was spent in counseling/coordination of care: YES  POSSIBLE D/C IN 2 DAYS, DEPENDING ON CLINICAL CONDITION.   Nile Dorning M.D on 04/27/2019 at 12:23 PM  Between 7am to 6pm - Pager - [phone]  After 6pm go to [email or web address] - Proofreader  [facility]  Office  [phone]  CC: Primary care physician; [name], MD  Note: This dictation was prepared with Dragon dictation along with smaller phrase technology. Any transcriptional errors that result from this process are unintentional.

## 2019-04-27 NOTE — Telephone Encounter (Signed)
68 yo M current  inpatient with multiple medical comorbidities who developed urinary retention during his hospitalization.  Started on Flomax with Foley catheter placed.  Contacted by hospitalist to arrange outpatient follow-up.  Please arrange for outpatient visit in urology clinic next week to establish care with a provider/voiding trial (a.m. appointment please).  Any provider.  Hollice Espy, MD

## 2019-04-27 NOTE — Care Management Important Message (Signed)
Important Message  Patient Details  Name: Cameron Adams MRN: [id number] Date of Birth: Aug 26, 1951   Medicare Important Message Given:  Yes     Juliann Pulse A Stacyann Mcconaughy 04/27/2019, 11:11 AM

## 2019-04-28 LAB — GLUCOSE, CAPILLARY
Glucose-Capillary: 139 mg/dL — ABNORMAL HIGH (ref 70–99)
Glucose-Capillary: 136 mg/dL — ABNORMAL HIGH (ref 70–99)
Glucose-Capillary: 113 mg/dL — ABNORMAL HIGH (ref 70–99)
Glucose-Capillary: 160 mg/dL — ABNORMAL HIGH (ref 70–99)

## 2019-04-28 LAB — SARS CORONAVIRUS 2 BY RT PCR (HOSPITAL ORDER, PERFORMED IN ~~LOC~~ HOSPITAL LAB): SARS Coronavirus 2: NEGATIVE

## 2019-04-28 LAB — URINE CULTURE: Culture: 60000 — AB

## 2019-04-28 MED ORDER — DIPHENHYDRAMINE HCL 50 MG/ML IJ SOLN
12.5000 mg | Freq: Once | INTRAMUSCULAR | Status: AC
  Administered 2019-04-28: 03:00:00 12.5 mg via INTRAVENOUS
  Filled 2019-04-28: qty 1

## 2019-04-28 MED ORDER — HYDRALAZINE HCL 20 MG/ML IJ SOLN
10.0000 mg | Freq: Four times a day (QID) | INTRAMUSCULAR | Status: DC | PRN
  Administered 2019-04-29: 10 mg via INTRAVENOUS
  Filled 2019-04-28: qty 1

## 2019-04-28 MED ORDER — AMLODIPINE BESYLATE 5 MG PO TABS
5.0000 mg | ORAL_TABLET | Freq: Every day | ORAL | Status: DC
  Administered 2019-04-28: 09:00:00 5 mg via ORAL
  Filled 2019-04-28: qty 1

## 2019-04-28 NOTE — Progress Notes (Signed)
Patient has been non compliant with safety measures and restless through out the night. MD Marcille Blanco aware. Pt refused VS reassessment. Pt continues to try to get out of bed, low bed in place in lowest position, floor mat in place, bed alarm on. Will continue to monitor closely for safety.

## 2019-04-28 NOTE — Progress Notes (Signed)
  Speech Language Pathology Treatment: Dysphagia  Patient Details Name: Cameron Adams MRN: [id number] DOB: March 11, 1951 Today's Date: 04/28/2019 Time: 4332-9518 SLP Time Calculation (min) (ACUTE ONLY): 25 min  Assessment / Plan / Recommendation Clinical Impression  Pt continues to present with a minimal to moderate risk for aspiration as evidenced by an oral dysphagia with prolonged mastication, prolonged bolus formation. Pt is edentulous and states he does have teeth at home, but does not have them with him. Pt was able to masticate dysphagia 2 solids with no overt ssx aspiration. Pt was able to intake 4 oz thin liquid via cup with no overt ssx aspiration. Pt required cues and reminders for all po intake due to vision deficits. SLP completed education with pt on diet recommendations and slp is guarded on pt retention of information as he was unable to restate information post st completion of education. ST will continue to follow.           SLP Plan  Continue with current plan of care       Recommendations  Diet recommendations: Dysphagia 2 (fine chop);Thin liquid Liquids provided via: Cup;Straw Medication Administration: Whole meds with liquid Supervision: Staff to assist with self feeding;Patient able to self feed Compensations: Slow rate;Small sips/bites;Follow solids with liquid Postural Changes and/or Swallow Maneuvers: Seated upright 90 degrees                Oral Care Recommendations: Oral care BID SLP Visit Diagnosis: Dysphagia, oropharyngeal phase (R13.12) Plan: Continue with current plan of care       [place] 04/28/2019, 12:12 PM

## 2019-04-28 NOTE — TOC Progression Note (Addendum)
Transition of Care [facility]) - Progression Note    Patient Details  Name: Cameron Adams MRN: [id number] Date of Birth: 1951/01/06  Transition of [facility]) CM/SW Contact  Candie Chroman, LCSW Phone Number: 04/28/2019, 11:39 AM  Clinical Narrative: CSW met with patient to discuss SNF again. He is now agreeable and had said he had been to [facility] in [place] in the past. According to chart review this occurred sometime between 2017-2018. Patient gave CSW permission to discuss with his daughter [name]. She is agreeable to SNF as well and is hopeful for local placement.    2:23 pm: Gave bed offers to daughter. She chose [facility]. She has updated her sister who is actually HCPOA [name]: [phone]). CSW told [name] to have Tenika take the HCPOA to the facility. We do not have it on file. CSW sent message to MD to update and requested new COVID test. Last one was done 7/19.  Expected Discharge Plan: [place] Barriers to Discharge: Continued Medical Work up  Expected Discharge Plan and Services Expected Discharge Plan: [place] In-house Referral: Clinical Social Work     Living arrangements for the past 2 months: Single Family Home                                       Social Determinants of Health (SDOH) Interventions    Readmission Risk Interventions Readmission Risk Prevention Plan 04/26/2019 01/16/2019  Transportation Screening Complete Complete  PCP or Specialist Appt within 3-5 Days - Complete  HRI or [place] - Complete  Social Work Consult for [place] Planning/Counseling - Complete  Palliative Care Screening - Not Applicable  Medication Review Press photographer) Complete Complete  HRI or Home Care Consult Complete -  SW Recovery Care/Counseling Consult Complete -  Palliative Care Screening Not Applicable -  [place] Patient Refused -  Some recent data might be hidden

## 2019-04-28 NOTE — Progress Notes (Signed)
[facility] [place] NAME: Cameron Adams    MR#:  [id number]  DATE OF BIRTH:  [birth year]  SUBJECTIVE:  CHIEF COMPLAINT:   Chief Complaint  Patient presents with  . Altered Mental Status   Patient reported to have had some confusion last night.  Leg confusion appears improved this morning.  Patient now agreeable to skilled nursing facility placement on discharge.  REVIEW OF SYSTEMS:  Review of Systems  Constitutional: Negative for chills and fever.  HENT: Negative for hearing loss and tinnitus.   Eyes: Negative for blurred vision and double vision.  Respiratory: Negative for cough and shortness of breath.   Cardiovascular: Negative for chest pain and palpitations.  Gastrointestinal: Negative for heartburn and nausea.  Genitourinary: Negative for dysuria.  Musculoskeletal: Negative for myalgias and neck pain.  Skin: Negative for itching and rash.  Neurological: Negative for dizziness and headaches.  Psychiatric/Behavioral: Negative for depression and hallucinations.     DRUG ALLERGIES:   Allergies  Allergen Reactions  . Gabapentin     Other reaction(s): Other (See Comments) Trouble sleeping    VITALS:  Blood pressure (!) 216/89, pulse 72, temperature 98.2 F (36.8 C), temperature source Oral, resp. rate 18, height 5\' 11"  (1.803 m), weight 67.6 kg, SpO2 99 %. PHYSICAL EXAMINATION:  Physical Exam  GENERAL:  68 y.o.-year-old patient lying in the bed,  . EYES: Pupils equal, round, reactive to light and accommodation. No scleral icterus. Extraocular muscles intact.  HEENT: Head atraumatic, normocephalic. Oropharynx and nasopharynx clear.  NECK:  Supple, no jugular venous distention. No thyroid enlargement, no tenderness.  LUNGS: Normal breath sounds bilaterally, no wheezing, rales,rhonchi or crepitation. No use of accessory muscles of respiration.  CARDIOVASCULAR: RRR, S1, S2 normal. No murmurs, rubs, or gallops.  ABDOMEN:  Unable to examine abdomen due to agitation GU; has Foley catheter in place EXTREMITIES: No pedal edema, cyanosis, or clubbing.  NEUROLOGIC:  Generalized weakness with strength of 4/5.  Awake and alert  PSYCHIATRIC: Patient awake and alert this morning.  Oriented  SKIN: No obvious rash, lesion, or ulcer.   LABORATORY PANEL:  Male CBC Recent Labs  Lab 04/26/19 0634  WBC 3.5*  HGB 9.7*  HCT 29.9*  PLT 108*   ------------------------------------------------------------------------------------------------------------------ Chemistries  Recent Labs  Lab 04/24/19 1349  04/27/19 0509  NA 140   < > 143  K 3.7   < > 4.3  CL 108   < > 112*  CO2 23   < > 26  GLUCOSE 232*   < > 111*  BUN 34*   < > 34*  CREATININE 2.85*   < > 2.68*  CALCIUM 7.8*   < > 7.9*  MG  --    < > 2.0  AST 27  --   --   ALT 14  --   --   ALKPHOS 65  --   --   BILITOT 0.5  --   --    < > = values in this interval not displayed.   RADIOLOGY:  No results found. ASSESSMENT AND PLAN:    Altered mental status/agitation. Also having hallucinations. Most likely due to urinary tract infection; significantly improved this morning -CT head unremarkable MRI of the brain with no acute findings.Patient seen by speech therapist.  Started on dysphagia 2 with thin liquids today COVID test negative Patient also evaluated by psychiatry service.  Recommendation is to continue current regimen and psychiatry service signed off  Urinary tract  infection Patient started empirically on IV Rocephin pending results of urine culture and sensitivities.  So far no growth on urine cultures  Acute urinary retention. Foley catheter placed with 600 cc of urine output last night.  Flomax started. Discussed case with urologist Dr. Erlene Quan.  Recommendation is to arrange for outpatient follow-up with urology clinic next week to establish care and for voiding trial.  For AM appointment with urologist next week on discharge  History of  seizures to Patient started on IV Depakote by neurology recently and now transitioned to p.o. Depakote.  Depakote level of 67.Patient seen by neurologist.  Appreciate input.    EEG done consistent with mild generalized nonspecific cerebral dysfunction.  No focal seizure or seizure predisposition noted on this study. 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  Skilled nursing facility placement recommended but patient declined.  Wishes to be discharged home with home health services on discharge tomorrow.  DVT prophylaxis; heparin  All the records are reviewed and case discussed with Care Management/Social Worker. Management plans discussed with the patient, family and they are in agreement. Called and updated patient's daughter Ms. Latonya today on treatment and discharge to be placed this morning plans as outlined above.  All questions were answered and she is in agreement to the plan of care.   CODE STATUS: Full Code  TOTAL TIME TAKING CARE OF THIS PATIENT: 3.  M6inutes.   More than 50% of the time was spent in counseling/coordination of care: YES  POSSIBLE D/C IN 2 DAYS, DEPENDING ON CLINICAL CONDITION.   Cameron Adams M.D on 04/28/2019 at 2:27 PM  Between 7am to 6pm - Pager - [phone]  After 6pm go to [email or web address] - Proofreader  [facility]  Office  [phone]  CC: Primary care physician; [name], MD  Note: This dictation was prepared  with Dragon dictation along with smaller phrase technology. Any transcriptional errors that result from this process are unintentional.

## 2019-04-28 NOTE — Progress Notes (Signed)
PT Cancellation Note  Patient Details Name: Cameron Adams MRN: [id number] DOB: 18-Dec-1950   Cancelled Treatment:    Reason Eval/Treat Not Completed: Other (comment) Pt with elevated BP, diastolic >209, PT held.  PT has recommended rehab, but pt id refusing. Hoping to go home 7/24.  Kreg Shropshire, DPT 04/28/2019, 5:30 PM

## 2019-04-28 NOTE — NC FL2 (Signed)
[place] LEVEL OF CARE SCREENING TOOL     IDENTIFICATION  Patient Name: Cameron Adams Birthdate: 11/15/1950 Sex: male Admission Date (Current Location): 04/24/2019  [place] and [place] Number:  Engineering geologist and Address:  [facility], [address], [place], [place] [address]      Provider Number: [id number]  Attending Physician Name and Address:  Otila Back, MD  Relative Name and Phone Number:       Current Level of Care: Hospital Recommended Level of Care: [place] Prior Approval Number:    Date Approved/Denied:   PASRR Number: [id number]  Discharge Plan: SNF    Current Diagnoses: Patient Active Problem List   Diagnosis Date Noted  . Confusion   . Visual hallucination   . Altered mental status 04/24/2019  . Syncope and collapse 01/12/2019  . Weakness   . AKI (acute kidney injury) ([place]) 12/04/2018  . Dizziness 06/22/2017  . Hyperkalemia 06/16/2017  . Esophageal dysmotilities   . Dysphagia   . Rib fractures 05/17/2017  . Hemothorax, right 05/17/2017  . Recurrent major depressive disorder, in partial remission ([place]) 05/13/2017  . Pure hypercholesterolemia 05/13/2017  . IDDM (insulin dependent diabetes mellitus) ([place]) 05/13/2017  . Hypothyroidism (acquired) 05/13/2017  . GAD (generalized anxiety disorder) 05/13/2017  . CKD (chronic kidney disease) stage 3, GFR 30-59 ml/min (HCC) 05/13/2017  . Toe gangrene ([place]) 09/16/2016  . PVD (peripheral vascular disease) ([place]) 09/16/2016  . Uncontrolled diabetes mellitus ([place]) 09/16/2016  . Hyponatremia 09/16/2016  . Hypokalemia 09/16/2016  . Essential hypertension, malignant 09/16/2016  . Abnormal angiogram 09/16/2016  . Gangrene ([place]) 09/13/2016  . Pruritus 03/26/2016  . Orthostatic hypotension 03/26/2016  . 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 Alcohol abuse 03/26/2016  . Hyperglycemia 03/23/2016  . Passive suicidal  ideations 09/16/2014  . Dyspnea 11/09/2013  . CAP (community acquired pneumonia) 10/31/2013  . Chest pain 10/31/2013  . Chronic respiratory failure with hypoxia ([place]) 10/31/2013  . 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 Diabetic retinopathy ([place]) 06/16/2013  . Gynecomastia 04/18/2013    Orientation RESPIRATION BLADDER Height & Weight     Self, Time, Situation, Place(Gets confused)  Normal Incontinent, Indwelling catheter Weight: 149 lb (67.6 kg) Height:  5\' 11"  (180.3 cm)  BEHAVIORAL SYMPTOMS/MOOD NEUROLOGICAL BOWEL NUTRITION STATUS  (Was restless last night)   Continent Diet(DYS 2)  AMBULATORY STATUS COMMUNICATION OF NEEDS Skin   Extensive Assist Verbally Skin abrasions                       Personal Care Assistance Level of Assistance  Bathing, Feeding, Dressing Bathing Assistance: Maximum assistance Feeding assistance: Limited assistance Dressing Assistance: Maximum assistance     Functional Limitations Info  Sight, Hearing, Speech Sight Info: Adequate Hearing Info: Adequate Speech Info: Adequate    SPECIAL CARE FACTORS FREQUENCY  PT (By licensed PT), Speech therapy, OT (By licensed OT)     PT Frequency: 5 x week OT Frequency: 5 x week     Speech Therapy Frequency: 5 x week      Contractures Contractures Info: Not present    Additional Factors Info  Code Status, Allergies, Psychotropic Code Status Info: Full code Allergies Info: Gabapentin Psychotropic Info: Celexa 20 mg PO daily, Depakote ER 750 mg PO daily,         Current Medications (04/28/2019):  This is the current hospital active medication list Current Facility-Administered Medications  Medication Dose Route Frequency Provider  Last Rate Last Dose  . 0.9 %  sodium chloride infusion   Intravenous PRN Stark Jock Jude, MD   Stopped at 04/25/19 1917  . acetaminophen (TYLENOL) tablet 650 mg  650 mg Oral Q6H PRN Sela Hua, MD   650 mg at 04/26/19 0944   Or  . acetaminophen (TYLENOL) suppository  650 mg  650 mg Rectal Q6H PRN Mayo, Pete Pelt, MD      . amLODipine (NORVASC) tablet 5 mg  5 mg Oral Daily Stark Jock, Jude, MD   5 mg at 04/28/19 0834  . aspirin EC tablet 81 mg  81 mg Oral Daily Mayo, Pete Pelt, MD   81 mg at 04/28/19 1610  . atorvastatin (LIPITOR) tablet 40 mg  40 mg Oral QHS Mayo, Pete Pelt, MD   40 mg at 04/27/19 2142  . cefTRIAXone (ROCEPHIN) 1 g in sodium chloride 0.9 % 100 mL IVPB  1 g Intravenous Q24H Stark Jock, Jude, MD   Stopped at 04/27/19 1046  . cholecalciferol (VITAMIN D3) tablet 2,000 Units  2,000 Units Oral Daily Mayo, Pete Pelt, MD   2,000 Units at 04/28/19 [phone]  . citalopram (CELEXA) tablet 20 mg  20 mg Oral Daily Mayo, Pete Pelt, MD   20 mg at 04/28/19 0834  . cloNIDine (CATAPRES) tablet 0.2 mg  0.2 mg Oral PRN Mansy, Jan A, MD   0.2 mg at 04/27/19 1011  . clopidogrel (PLAVIX) tablet 75 mg  75 mg Oral Daily Mayo, Pete Pelt, MD   75 mg at 04/28/19 0834  . dextrose 5 %-0.9 % sodium chloride infusion   Intravenous Continuous Seals, Theo Dills, NP 50 mL/hr at 04/27/19 1708    . divalproex (DEPAKOTE ER) 24 hr tablet 750 mg  750 mg Oral Daily Vu, Talbert Cage, MD   750 mg at 04/28/19 5409  . haloperidol lactate (HALDOL) injection 2 mg  2 mg Intravenous Q6H PRN Mayo, Pete Pelt, MD      . heparin injection 5,000 Units  5,000 Units Subcutaneous Q8H Mayo, Pete Pelt, MD   5,000 Units at 04/28/19 0533  . hydrALAZINE (APRESOLINE) injection 10 mg  10 mg Intravenous Q6H PRN Ojie, Jude, MD      . insulin aspart (novoLOG) injection 0-5 Units  0-5 Units Subcutaneous QHS Sela Hua, MD   2 Units at 04/27/19 2141  . insulin aspart (novoLOG) injection 0-9 Units  0-9 Units Subcutaneous TID WC Mayo, Pete Pelt, MD   1 Units at 04/28/19 [phone]  . labetalol (NORMODYNE) injection 10 mg  10 mg Intravenous Q4H PRN Sela Hua, MD   10 mg at 04/25/19 0545  . levothyroxine (SYNTHROID) tablet 100 mcg  100 mcg Oral Q0600 Stark Jock, Jude, MD   100 mcg at 04/28/19 0533  . lipase/protease/amylase (CREON) capsule  48,000 Units  48,000 Units Oral TID WC Sela Hua, MD   48,000 Units at 04/28/19 [phone]  . ondansetron (ZOFRAN) tablet 4 mg  4 mg Oral Q6H PRN Mayo, Pete Pelt, MD       Or  . ondansetron [facility]) injection 4 mg  4 mg Intravenous Q6H PRN Mayo, Pete Pelt, MD      . polyethylene glycol (MIRALAX / GLYCOLAX) packet 17 g  17 g Oral Daily PRN Mayo, Pete Pelt, MD      . sodium bicarbonate tablet 650 mg  650 mg Oral BID Sela Hua, MD   650 mg at 04/28/19 0834  . tamsulosin (FLOMAX) capsule 0.4 mg  0.4 mg Oral Daily  Stark Jock Jude, MD   0.4 mg at 04/28/19 3545     Discharge Medications: Please see discharge summary for a list of discharge medications.  Relevant Imaging Results:  Relevant Lab Results:   Additional Information SS#: [id number]. No longer having hallucinations. Psych signed off on him yesterday (7/22).  Candie Chroman, LCSW

## 2019-04-28 NOTE — Progress Notes (Signed)
   04/28/19 1100  Clinical Encounter Type  Visited With Patient  Visit Type Initial  Spiritual Encounters  Spiritual Needs Emotional  Ch was rounding. Pt expressed his desire for self-sufficiency and the frustration he feels from facing the reality of dependency due to his illness and weakness. Pt said he was afraid to go home because of the thought that he might burden his daughter who is living with him. Pt was glad that the doctor told him that he might stay here for another week. Ch let him share his feelings and thoughts. Ch left the room as pt needed rest. Pt will benefit from a follow up for emotional support.

## 2019-04-28 NOTE — Progress Notes (Signed)
Urology Inpatient Progress Note  Subjective: Cameron Adams is a 68 y.o. male admitted on 04/24/2019 with AMS, falls and subsequently found to have urinary retention. Dr. Erlene Quan placed a Foley catheter and started the patient on Flomax on 7/22 with the plan for him to return to our clinic for voiding trial in one week.  Today, patient is calm and reports no pain. History is difficult to obtain due to AMS, however patient states he feels "95%" today. No suprapubic tenderness on PE. Foley in place with 1556mL clear yellow urinary output.  Urine cultures from day of admission with no growth, urine cultures from 7/21 with 60k colonies/mL yeast.  Anti-infectives: Anti-infectives (From admission, onward)   Start     Dose/Rate Route Frequency Ordered Stop   04/25/19 1000  cefTRIAXone (ROCEPHIN) 1 g in sodium chloride 0.9 % 100 mL IVPB     1 g 200 mL/hr over 30 Minutes Intravenous Every 24 hours 04/25/19 0756        Current Facility-Administered Medications  Medication Dose Route Frequency Provider Last Rate Last Dose  . 0.9 %  sodium chloride infusion   Intravenous PRN Stark Jock Jude, MD   Stopped at 04/25/19 1917  . acetaminophen (TYLENOL) tablet 650 mg  650 mg Oral Q6H PRN Sela Hua, MD   650 mg at 04/26/19 0944   Or  . acetaminophen (TYLENOL) suppository 650 mg  650 mg Rectal Q6H PRN Mayo, Pete Pelt, MD      . amLODipine (NORVASC) tablet 5 mg  5 mg Oral Daily Stark Jock, Jude, MD   5 mg at 04/28/19 0834  . aspirin EC tablet 81 mg  81 mg Oral Daily Mayo, Pete Pelt, MD   81 mg at 04/28/19 2831  . atorvastatin (LIPITOR) tablet 40 mg  40 mg Oral QHS Mayo, Pete Pelt, MD   40 mg at 04/27/19 2142  . cefTRIAXone (ROCEPHIN) 1 g in sodium chloride 0.9 % 100 mL IVPB  1 g Intravenous Q24H Ojie, Jude, MD 200 mL/hr at 04/28/19 1000 1 g at 04/28/19 1000  . cholecalciferol (VITAMIN D3) tablet 2,000 Units  2,000 Units Oral Daily Mayo, Pete Pelt, MD   2,000 Units at 04/28/19 [phone]  . citalopram (CELEXA) tablet  20 mg  20 mg Oral Daily Mayo, Pete Pelt, MD   20 mg at 04/28/19 0834  . cloNIDine (CATAPRES) tablet 0.2 mg  0.2 mg Oral PRN Mansy, Jan A, MD   0.2 mg at 04/27/19 1011  . clopidogrel (PLAVIX) tablet 75 mg  75 mg Oral Daily Mayo, Pete Pelt, MD   75 mg at 04/28/19 0834  . dextrose 5 %-0.9 % sodium chloride infusion   Intravenous Continuous Seals, Theo Dills, NP 50 mL/hr at 04/28/19 1343    . divalproex (DEPAKOTE ER) 24 hr tablet 750 mg  750 mg Oral Daily Vu, Talbert Cage, MD   750 mg at 04/28/19 1607  . haloperidol lactate (HALDOL) injection 2 mg  2 mg Intravenous Q6H PRN Mayo, Pete Pelt, MD      . heparin injection 5,000 Units  5,000 Units Subcutaneous Q8H Mayo, Pete Pelt, MD   5,000 Units at 04/28/19 1344  . hydrALAZINE (APRESOLINE) injection 10 mg  10 mg Intravenous Q6H PRN Ojie, Jude, MD      . insulin aspart (novoLOG) injection 0-5 Units  0-5 Units Subcutaneous QHS Mayo, Pete Pelt, MD   2 Units at 04/27/19 2141  . insulin aspart (novoLOG) injection 0-9 Units  0-9 Units Subcutaneous TID WC Mayo,  Pete Pelt, MD   1 Units at 04/28/19 1230  . labetalol (NORMODYNE) injection 10 mg  10 mg Intravenous Q4H PRN Mayo, Pete Pelt, MD   10 mg at 04/25/19 0545  . levothyroxine (SYNTHROID) tablet 100 mcg  100 mcg Oral Q0600 Stark Jock, Jude, MD   100 mcg at 04/28/19 0533  . lipase/protease/amylase (CREON) capsule 48,000 Units  48,000 Units Oral TID WC Mayo, Pete Pelt, MD   48,000 Units at 04/28/19 1344  . ondansetron (ZOFRAN) tablet 4 mg  4 mg Oral Q6H PRN Mayo, Pete Pelt, MD       Or  . ondansetron [facility]) injection 4 mg  4 mg Intravenous Q6H PRN Mayo, Pete Pelt, MD      . polyethylene glycol (MIRALAX / GLYCOLAX) packet 17 g  17 g Oral Daily PRN Mayo, Pete Pelt, MD      . sodium bicarbonate tablet 650 mg  650 mg Oral BID Sela Hua, MD   650 mg at 04/28/19 0834  . tamsulosin (FLOMAX) capsule 0.4 mg  0.4 mg Oral Daily Ojie, Jude, MD   0.4 mg at 04/28/19 0834     Objective: Vital signs in last 24 hours: Temp:  [97.6  F (36.4 C)-98.6 F (37 C)] 98.2 F (36.8 C) (07/23 0859) Pulse Rate:  [67-72] 72 (07/23 0859) Resp:  [16-18] 18 (07/23 0859) BP: (148-216)/(65-89) 216/89 (07/23 0859) SpO2:  [99 %-100 %] 99 % (07/23 0859)  Intake/Output from previous day: 07/22 0701 - 07/23 0700 In: 1905.7 [P.O.:660; I.V.:1145.7; IV Piggyback:100] Out: 1050 [Urine:1050] Intake/Output this shift: Total I/O In: 120 [P.O.:120] Out: -   Physical Exam Vitals signs reviewed.  Constitutional:      General: He is not in acute distress.    Appearance: He is not ill-appearing, toxic-appearing or diaphoretic.  Eyes:     General: No scleral icterus. Pulmonary:     Effort: Pulmonary effort is normal. No respiratory distress.  Abdominal:     General: There is distension.     Palpations: Abdomen is soft.     Comments: See HPI  Skin:    General: Skin is warm and dry.     Lab Results:  Recent Labs    04/26/19 0634  WBC 3.5*  HGB 9.7*  HCT 29.9*  PLT 108*   BMET Recent Labs    04/26/19 0634 04/27/19 0509  NA 142 143  K 4.0 4.3  CL 111 112*  CO2 25 26  GLUCOSE 152* 111*  BUN 31* 34*  CREATININE 2.60* 2.68*  CALCIUM 8.0* 7.9*   Urinalysis    Component Value Date/Time   COLORURINE YELLOW (A) 04/26/2019 2020   APPEARANCEUR CLOUDY (A) 04/26/2019 2020   LABSPEC 1.011 04/26/2019 2020   PHURINE 5.0 04/26/2019 2020   GLUCOSEU 50 (A) 04/26/2019 2020   HGBUR MODERATE (A) 04/26/2019 2020   BILIRUBINUR NEGATIVE 04/26/2019 2020   KETONESUR NEGATIVE 04/26/2019 2020   PROTEINUR 100 (A) 04/26/2019 2020   NITRITE NEGATIVE 04/26/2019 2020   LEUKOCYTESUR LARGE (A) 04/26/2019 2020    Results for orders placed or performed during the hospital encounter of 04/24/19  SARS Coronavirus 2 (CEPHEID - Performed in [place] hospital lab), Hosp Order     Status: None   Collection Time: 04/24/19  2:57 PM   Specimen: Nasopharyngeal Swab  Result Value Ref Range Status   SARS Coronavirus 2 NEGATIVE NEGATIVE Final     Comment: (NOTE) If result is NEGATIVE SARS-CoV-2 target nucleic acids are NOT DETECTED. The SARS-CoV-2  RNA is generally detectable in upper and lower  respiratory specimens during the acute phase of infection. The lowest  concentration of SARS-CoV-2 viral copies this assay can detect is 250  copies / mL. A negative result does not preclude SARS-CoV-2 infection  and should not be used as the sole basis for treatment or other  patient management decisions.  A negative result may occur with  improper specimen collection / handling, submission of specimen other  than nasopharyngeal swab, presence of viral mutation(s) within the  areas targeted by this assay, and inadequate number of viral copies  (<250 copies / mL). A negative result must be combined with clinical  observations, patient history, and epidemiological information. If result is POSITIVE SARS-CoV-2 target nucleic acids are DETECTED. The SARS-CoV-2 RNA is generally detectable in upper and lower  respiratory specimens dur ing the acute phase of infection.  Positive  results are indicative of active infection with SARS-CoV-2.  Clinical  correlation with patient history and other diagnostic information is  necessary to determine patient infection status.  Positive results do  not rule out bacterial infection or co-infection with other viruses. If result is PRESUMPTIVE POSTIVE SARS-CoV-2 nucleic acids MAY BE PRESENT.   A presumptive positive result was obtained on the submitted specimen  and confirmed on repeat testing.  While 2019 novel coronavirus  (SARS-CoV-2) nucleic acids may be present in the submitted sample  additional confirmatory testing may be necessary for epidemiological  and / or clinical management purposes  to differentiate between  SARS-CoV-2 and other Sarbecovirus currently known to infect humans.  If clinically indicated additional testing with an alternate test  methodology [phone]) is advised. The SARS-CoV-2  RNA is generally  detectable in upper and lower respiratory sp ecimens during the acute  phase of infection. The expected result is Negative. Fact Sheet for Patients:  StrictlyIdeas.no Fact Sheet for Healthcare Providers: BankingDealers.co.za This test is not yet approved or cleared by the [place] FDA and has been authorized for detection and/or diagnosis of SARS-CoV-2 by FDA under an Emergency Use Authorization (EUA).  This EUA will remain in effect (meaning this test can be used) for the duration of the COVID-19 declaration under Section 564(b)(1) of the Act, 21 U.S.C. section 360bbb-3(b)(1), unless the authorization is terminated or revoked sooner. Performed at [facility], [address] [place], [place] [address]   Urine culture     Status: None   Collection Time: 04/24/19  5:12 PM   Specimen: Urine, Random  Result Value Ref Range Status   Specimen Description   Final    URINE, RANDOM Performed at [facility], [address], [place], [place] [address]    Special Requests   Final    NONE Performed at [facility], [address], [place], [place] [address]    Culture   Final    NO GROWTH Performed at [facility], [place] [address], [place], [place] [address]    Report Status 04/26/2019 FINAL  Final  Urine Culture     Status: Abnormal   Collection Time: 04/26/19  8:20 PM   Specimen: Urine, Catheterized  Result Value Ref Range Status   Specimen Description   Final    URINE, CATHETERIZED Performed at [facility], [address], [place], [place] [address]    Special Requests   Final    NONE Performed at [facility], [place], [place] [address]    Culture 60,000 COLONIES/mL YEAST (A)  Final   Report Status 04/28/2019 FINAL  Final   Assessment & Plan: 1. Urinary retention Foley in place and draining normal urine. White count unconcerning for  infection. Admission (7/19) urine cultures with no growth compared with 7/21 urine cultures with yeast growth is suggestive of colonization or sample contamination in the latter sample. No need to treat for fungal infection unless patient becomes symptomatic. No change in recommendations at this time. -Voiding trial scheduling in progress   Debroah Loop, PA-C 04/28/2019

## 2019-04-29 LAB — GLUCOSE, CAPILLARY
Glucose-Capillary: 160 mg/dL — ABNORMAL HIGH (ref 70–99)
Glucose-Capillary: 139 mg/dL — ABNORMAL HIGH (ref 70–99)
Glucose-Capillary: 150 mg/dL — ABNORMAL HIGH (ref 70–99)
Glucose-Capillary: 71 mg/dL (ref 70–99)

## 2019-04-29 MED ORDER — AMLODIPINE BESYLATE 10 MG PO TABS
10.0000 mg | ORAL_TABLET | Freq: Every day | ORAL | Status: DC
  Administered 2019-04-29 – 2019-04-30 (×2): 10 mg via ORAL
  Filled 2019-04-29 (×2): qty 1

## 2019-04-29 MED ORDER — FLUCONAZOLE 100MG IVPB
100.0000 mg | INTRAVENOUS | Status: DC
  Administered 2019-04-29: 17:00:00 100 mg via INTRAVENOUS
  Filled 2019-04-29 (×2): qty 50

## 2019-04-29 MED ORDER — METOPROLOL TARTRATE 25 MG PO TABS
25.0000 mg | ORAL_TABLET | Freq: Two times a day (BID) | ORAL | Status: DC
  Administered 2019-04-29 – 2019-04-30 (×2): 25 mg via ORAL
  Filled 2019-04-29 (×3): qty 1

## 2019-04-29 NOTE — Care Management Important Message (Signed)
Important Message  Patient Details  Name: Cameron Adams MRN: [id number] Date of Birth: 04-05-1951   Medicare Important Message Given:  Yes     [name] 04/29/2019, 11:44 AM

## 2019-04-29 NOTE — Telephone Encounter (Signed)
Follow up app given to Cameron Adams on 1-C

## 2019-04-29 NOTE — Progress Notes (Signed)
PT Cancellation Note  Patient Details Name: Cameron Adams MRN: [id number] DOB: Dec 04, 1950   Cancelled Treatment:    Reason Eval/Treat Not Completed: Patient's level of consciousness Pt presents with agitation and unable to responded to PT commands with showing strong resistant in B UE when PT attempts to put on BP cuff on. Pt presents hallucination by talking and reaching subjects that not presents in the air.  Will re-attempt later time/date as appropriate.    Sherrilyn Rist, SPT 04/29/2019, 1:04 PM

## 2019-04-29 NOTE — Progress Notes (Signed)
[facility] [place] NAME: Cameron Adams    MR#:  [id number]  DATE OF BIRTH:  [birth year]  SUBJECTIVE:  CHIEF COMPLAINT:   Chief Complaint  Patient presents with  . Altered Mental Status   Patient with no new complaints this morning.  Reported to have had some mild confusion earlier.  Blood pressure uncontrolled overnight with systolic blood pressure occasionally greater than 200.   REVIEW OF SYSTEMS:  Review of Systems  Constitutional: Negative for chills and fever.  HENT: Negative for hearing loss and tinnitus.   Eyes: Negative for blurred vision and double vision.  Respiratory: Negative for cough and shortness of breath.   Cardiovascular: Negative for chest pain and palpitations.  Gastrointestinal: Negative for heartburn and nausea.  Genitourinary: Negative for dysuria.  Musculoskeletal: Negative for myalgias and neck pain.  Skin: Negative for itching and rash.  Neurological: Negative for dizziness and headaches.  Psychiatric/Behavioral: Negative for depression and hallucinations.     DRUG ALLERGIES:   Allergies  Allergen Reactions  . Gabapentin     Other reaction(s): Other (See Comments) Trouble sleeping    VITALS:  Blood pressure (!) 174/89, pulse 78, temperature 97.8 F (36.6 C), temperature source Axillary, resp. rate 17, height 5\' 11"  (1.803 m), weight 67.6 kg, SpO2 99 %. PHYSICAL EXAMINATION:  Physical Exam  GENERAL:  68 y.o.-year-old patient lying in the bed,  . EYES: Pupils equal, round, reactive to light and accommodation. No scleral icterus. Extraocular muscles intact.  HEENT: Head atraumatic, normocephalic. Oropharynx and nasopharynx clear.  NECK:  Supple, no jugular venous distention. No thyroid enlargement, no tenderness.  LUNGS: Normal breath sounds bilaterally, no wheezing, rales,rhonchi or crepitation. No use of accessory muscles of respiration.  CARDIOVASCULAR: RRR, S1, S2 normal. No murmurs, rubs, or  gallops.  ABDOMEN: Unable to examine abdomen due to agitation GU; has Foley catheter in place EXTREMITIES: No pedal edema, cyanosis, or clubbing.  NEUROLOGIC:  Generalized weakness with strength of 4/5.  Awake and alert  PSYCHIATRIC: Patient awake and alert this morning.  Oriented  SKIN: No obvious rash, lesion, or ulcer.   LABORATORY PANEL:  Male CBC Recent Labs  Lab 04/26/19 0634  WBC 3.5*  HGB 9.7*  HCT 29.9*  PLT 108*   ------------------------------------------------------------------------------------------------------------------ Chemistries  Recent Labs  Lab 04/24/19 1349  04/27/19 0509  NA 140   < > 143  K 3.7   < > 4.3  CL 108   < > 112*  CO2 23   < > 26  GLUCOSE 232*   < > 111*  BUN 34*   < > 34*  CREATININE 2.85*   < > 2.68*  CALCIUM 7.8*   < > 7.9*  MG  --    < > 2.0  AST 27  --   --   ALT 14  --   --   ALKPHOS 65  --   --   BILITOT 0.5  --   --    < > = values in this interval not displayed.   RADIOLOGY:  No results found. ASSESSMENT AND PLAN:    Altered mental status/agitation. Also having hallucinations. Most likely due to urinary tract infection; significantly improved this morning -CT head unremarkable MRI of the brain with no acute findings.Patient seen by speech therapist.  Started on dysphagia 2 with thin liquids today COVID test negative Patient also evaluated by psychiatry service.  Recommendation is to continue current regimen and psychiatry service signed off  Urinary tract infection Patient started empirically on IV Rocephin  Urine culture noted to have evidence of yeast growing.  Started on IV fluconazole  Acute urinary retention. Foley catheter placed with 600 cc of urine output last night.  Flomax started. Discussed case with urologist Dr. Erlene Quan.  Recommendation is to arrange for outpatient follow-up with urology clinic next week to establish care and for voiding trial.  For AM appointment with urologist next week on discharge   History of seizures to Patient started on IV Depakote by neurology recently and now transitioned to p.o. Depakote.  Depakote level of 67.Patient seen by neurologist.  Appreciate input.    EEG done consistent with mild generalized nonspecific cerebral dysfunction.  No focal seizure or seizure predisposition noted on this study. 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  Systolic blood pressure occasionally greater than 200.  Increase Norvasc from 5 to 10 mg daily.  Added metoprolol.  Monitor blood pressure over the next 24 hours and adjust regimen as needed.  Type 2 diabetes -Sensitive SSI  Anemia of chronic kidney disease- hemoglobin at baseline -Monitor  Hypothyroidism-stable -TSH level normal -Continue home Synthroid  Hyperlipidemia-stable -Continue home Lipitor  Debility due to multiple medical problems Seen by physical therapist.  Skilled nursing facility placement recommended but patient declined.  Wishes to be discharged home with home health services on discharge tomorrow.  DVT prophylaxis; heparin  All the records are reviewed and case discussed with Care Management/Social Worker. Management plans discussed with the patient, family and they are in agreement. Called and updated patient's daughter Cameron Adams recently on treatments Case manager working on placement  CODE STATUS: Full Code  TOTAL TIME TAKING CARE OF THIS PATIENT: 35 minutes.   More than 50% of the time was spent in counseling/coordination of care: YES  POSSIBLE D/C IN 2 DAYS, DEPENDING ON CLINICAL CONDITION.   Woodson Macha M.D on 69/24/2020 at 2:27 PM  Between 7am to 6pm - Pager - [phone]  After 6pm go to [email or web address] - Proofreader  [facility]  Office  [phone]  CC: Primary care physician; [name], MD  Note: This dictation  was prepared with Dragon dictation along with smaller phrase technology. Any transcriptional errors that result from this process are unintentional.

## 2019-04-29 NOTE — TOC Progression Note (Signed)
Transition of Care [facility]) - Progression Note    Patient Details  Name: BENEDETTO RYDER MRN: [id number] Date of Birth: [birth year]  Transition of Care [facility]) CM/SW Contact  Candie Chroman, LCSW Phone Number: 04/29/2019, 11:30 AM  Clinical Narrative: Patient will likely discharge to [facility] tomorrow. SNF admissions coordinator is aware. If patient discharges after Saturday he will need a new COVID test. MD is aware.    Expected Discharge Plan: [place] Barriers to Discharge: Continued Medical Work up  Expected Discharge Plan and Services Expected Discharge Plan: [place] In-house Referral: Clinical Social Work     Living arrangements for the past 2 months: Single Family Home                                       Social Determinants of Health (SDOH) Interventions    Readmission Risk Interventions Readmission Risk Prevention Plan 04/26/2019 01/16/2019  Transportation Screening Complete Complete  PCP or Specialist Appt within 3-5 Days - Complete  HRI or [place] - Complete  Social Work Consult for [place] Planning/Counseling - Complete  Palliative Care Screening - Not Applicable  Medication Review Press photographer) Complete Complete  HRI or Home Care Consult Complete -  SW Recovery Care/Counseling Consult Complete -  Palliative Care Screening Not Applicable -  [place] Patient Refused -  Some recent data might be hidden

## 2019-04-29 NOTE — Progress Notes (Signed)
  Speech Language Pathology Treatment: Dysphagia  Patient Details Name: Cameron Adams MRN: [id number] DOB: 1950-10-14 Today's Date: 04/29/2019 Time: 2130-8657 SLP Time Calculation (min) (ACUTE ONLY): 25 min  Assessment / Plan / Recommendation Clinical Impression  Pt continues to present with an oral dysphagia as evidenced by an inability to masticate solids due to pt being edentulous with no dentures present. Pt may be able to intake increased solids if dentures were present, however pt  Has no dentures available at this time. Pt had increased agitation with session today. SLP administered his noon meal and assisted in hand over hand intake of dysphagia 2 solids and thin liquids via straw. Pt had increased difficulty attending to task and refused to allow st to complete hand over hand task. Pt appeared to use a spoon to feed self, however no spoon was in hand and no food was presented. slp assisted in feeding pt solids and liquids. Pt became increasingly agitated during session. Pt intake 50% solids and 12 oz thin liquids. Pt had prolonged metrication with solids and attempted mastication when no bolus was presented. Pt had no overt ssx aspiration with solids or liquids.         SLP Plan  Continue with current plan of care       Recommendations  Diet recommendations: Dysphagia 2 (fine chop);Thin liquid Liquids provided via: Straw Medication Administration: Crushed with puree Supervision: Staff to assist with self feeding Compensations: Slow rate;Small sips/bites;Follow solids with liquid Postural Changes and/or Swallow Maneuvers: Seated upright 90 degrees                Oral Care Recommendations: Oral care BID SLP Visit Diagnosis: Dysphagia, oral phase (R13.11) Plan: Continue with current plan of care       GO                [place] 04/29/2019, 12:55 PM

## 2019-04-30 LAB — CBC
HCT: 26.3 % — ABNORMAL LOW (ref 39.0–52.0)
Hemoglobin: 8.5 g/dL — ABNORMAL LOW (ref 13.0–17.0)
MCH: 29.2 pg (ref 26.0–34.0)
MCHC: 32.3 g/dL (ref 30.0–36.0)
MCV: 90.4 fL (ref 80.0–100.0)
Platelets: 91 10*3/uL — ABNORMAL LOW (ref 150–400)
RBC: 2.91 MIL/uL — ABNORMAL LOW (ref 4.22–5.81)
RDW: 14.9 % (ref 11.5–15.5)
WBC: 3.7 10*3/uL — ABNORMAL LOW (ref 4.0–10.5)
nRBC: 0 % (ref 0.0–0.2)

## 2019-04-30 LAB — BASIC METABOLIC PANEL
Anion gap: 4 — ABNORMAL LOW (ref 5–15)
BUN: 30 mg/dL — ABNORMAL HIGH (ref 8–23)
CO2: 25 mmol/L (ref 22–32)
Calcium: 7.8 mg/dL — ABNORMAL LOW (ref 8.9–10.3)
Chloride: 113 mmol/L — ABNORMAL HIGH (ref 98–111)
Creatinine, Ser: 2.27 mg/dL — ABNORMAL HIGH (ref 0.61–1.24)
GFR calc Af Amer: 33 mL/min — ABNORMAL LOW (ref >60)
GFR calc non Af Amer: 29 mL/min — ABNORMAL LOW (ref >60)
Glucose, Bld: 141 mg/dL — ABNORMAL HIGH (ref 70–99)
Potassium: 4.3 mmol/L (ref 3.5–5.1)
Sodium: 142 mmol/L (ref 135–145)

## 2019-04-30 LAB — MAGNESIUM: Magnesium: 1.8 mg/dL (ref 1.7–2.4)

## 2019-04-30 LAB — GLUCOSE, CAPILLARY
Glucose-Capillary: 135 mg/dL — ABNORMAL HIGH (ref 70–99)
Glucose-Capillary: 110 mg/dL — ABNORMAL HIGH (ref 70–99)

## 2019-04-30 MED ORDER — METOPROLOL TARTRATE 25 MG PO TABS: 25.0000 mg | ORAL_TABLET | Freq: Two times a day (BID) | ORAL | Status: DC

## 2019-04-30 MED ORDER — AMLODIPINE BESYLATE 10 MG PO TABS: 10.0000 mg | ORAL_TABLET | Freq: Every day | ORAL | Status: AC

## 2019-04-30 MED ORDER — TAMSULOSIN HCL 0.4 MG PO CAPS: 0.4000 mg | ORAL_CAPSULE | Freq: Every day | ORAL | Status: AC

## 2019-04-30 NOTE — Discharge Summary (Addendum)
[facility] NAME: Cameron Adams    MR#:  [id number]  DATE OF BIRTH:  01-22-1951  DATE OF ADMISSION:  04/24/2019 ADMITTING PHYSICIAN: Sela Hua, MD  DATE OF DISCHARGE: 04/30/2019  PRIMARY CARE PHYSICIAN: [name], MD    ADMISSION DIAGNOSIS:  Visual hallucination [R44.1] Confusion [R41.0] CKD (chronic kidney disease) stage 4, GFR 15-29 ml/min (HCC) [N18.4]  DISCHARGE DIAGNOSIS:  Active Problems:   Altered mental status   Confusion   Visual hallucination   SECONDARY DIAGNOSIS:   Past Medical History:  Diagnosis Date  . Anemia of chronic disease   . Blindness of left eye   . Chronic kidney disease, stage III (moderate) (HCC)   . Diabetes ([place])   . Diarrhea   . Diastolic dysfunction    a. 06/2017 Echo: EF 55-60%, no rwma, Gr2 DD, Asc Ao 3.9cm, nl RV fxn, nl PASP.  Marland Kitchen Hypertension   . Hypothyroidism   . Monoclonal gammopathy of unknown significance (MGUS)    a. prev seen @ [facility].  Lost to f/u - never had BM bx.  . Orthostatic hypotension   . Syncope and collapse     HOSPITAL COURSE:   Cameron Adams  is a 68 y.o. male with a known history of type 2 diabetes, CKD III, hypertension, hypothyroidism, MGUS, chronic diastolic CHF, and anemia of CKD who presented to the ED with agitation and altered mental status.  1.  Altered mental status/agitation- initially thought to be secondary to urinary tract infection but that has been ruled out.  Patient was seen both by psychiatry and also neurology while in the hospital. - Neurologic work-up including MRI of the brain which was negative for acute pathology.  Patient's altered mental status encephalopathy was thought to be more metabolic in nature.  Patient underwent an EEG which was negative.  Patient's Depakote dose was adjusted.  Mental status in the hospital has significantly improved since admission is currently is at baseline.  2.  Urinary tract infection- acute on  admission given the patient's urinalysis.  Patient's urine cultures although were negative.  Urine grew out yeast which is likely a contaminant. - Patient was given empiric ceftriaxone and also Diflucan which have all been discontinued.  Patient is clinically asymptomatic now.  3.  Urinary retention- patient developed this while in the hospital.  Patient had a Foley catheter placed.  Seen by urology and started on Flomax.  Patient will continue current meds and follow-up with urology as an outpatient for a voiding trial.   4.  History of seizures- he was on Depakote and his dose was adjusted, patient was seen by neurology and also underwent EEG which showed no acute seizure activity. -Patient will continue his maintenance meds.  5.  CKD stage IV-patient's creatinine close to baseline and can be further followed as an outpatient.  6.  Essential hypertension-patient had some accelerated hypertension while in hospital.  His antihypertensive regimen was adjusted. - Presently patient will be discharged on Norvasc and metoprolol, his blood pressure stable on discharge.  7.  Hypothyroidism-patient will continue her Synthroid.  8.  Diabetes type 2 without complication-while in the hospital patient was on sliding scale insulin.  He will resume his glipizide upon discharge.  9.  Hyperlipidemia-patient will resume his atorvastatin.  10.  Chronic pancreatitis-patient will continue his pancreatic lipase supplements.  DISCHARGE CONDITIONS:   Stable  CONSULTS OBTAINED:  Treatment Team:  Hollice Espy, MD  DRUG ALLERGIES:   Allergies  Allergen Reactions  . Gabapentin     Other reaction(s): Other (See Comments) Trouble sleeping     DISCHARGE MEDICATIONS:   Allergies as of 04/30/2019      Reactions   Gabapentin    Other reaction(s): Other (See Comments) Trouble sleeping       Medication List    TAKE these medications   acetaminophen 500 MG tablet Commonly known as: TYLENOL Take  500-1,000 mg by mouth every 6 (six) hours as needed for mild pain, moderate pain or fever.   acidophilus Caps capsule Take 1 capsule by mouth daily.   amLODipine 10 MG tablet Commonly known as: NORVASC Take 1 tablet (10 mg total) by mouth daily.   aspirin EC 81 MG tablet Take 81 mg by mouth daily.   atorvastatin 40 MG tablet Commonly known as: LIPITOR Take 40 mg by mouth at bedtime.   citalopram 20 MG tablet Commonly known as: CELEXA Take 20 mg by mouth daily.   clopidogrel 75 MG tablet Commonly known as: Plavix Take 1 tablet (75 mg total) by mouth daily.   Creon 24000-76000 units Cpep Generic drug: Pancrelipase (Lip-Prot-Amyl) Take 48,000 Units by mouth 3 (three) times daily with meals. What changed: Another medication with the same name was removed. Continue taking this medication, and follow the directions you see here.   divalproex 500 MG 24 hr tablet Commonly known as: DEPAKOTE ER Take 1 tablet (500 mg total) by mouth 2 (two) times daily.   glipiZIDE 5 MG tablet Commonly known as: GLUCOTROL Take 2.5 mg by mouth 2 (two) times daily.   hydrOXYzine 10 MG tablet Commonly known as: ATARAX/VISTARIL Take 1 tablet (10 mg total) by mouth every 8 (eight) hours as needed for anxiety.   levothyroxine 100 MCG tablet Commonly known as: SYNTHROID Take 1 tablet (100 mcg total) by mouth daily before breakfast. What changed: how much to take   loperamide 2 MG capsule Commonly known as: IMODIUM Take 1 capsule (2 mg total) by mouth every 6 (six) hours as needed for diarrhea or loose stools.   metoprolol tartrate 25 MG tablet Commonly known as: LOPRESSOR Take 1 tablet (25 mg total) by mouth 2 (two) times daily.   sodium bicarbonate 650 MG tablet Take 650 mg by mouth 2 (two) times daily.   tamsulosin 0.4 MG Caps capsule Commonly known as: FLOMAX Take 1 capsule (0.4 mg total) by mouth daily.   Vitamin D-1000 Max St 25 MCG (1000 UT) tablet Generic drug:  Cholecalciferol Take 2,000 Units by mouth daily.         DISCHARGE INSTRUCTIONS:   DIET:  Cardiac diet and Diabetic diet  DISCHARGE CONDITION:  Stable  ACTIVITY:  Activity as tolerated  OXYGEN:  Home Oxygen: No.   Oxygen Delivery: room air  DISCHARGE LOCATION:  nursing home   If you experience worsening of your admission symptoms, develop shortness of breath, life threatening emergency, suicidal or homicidal thoughts you must seek medical attention immediately by calling 911 or calling your MD immediately  if symptoms less severe.  You Must read complete instructions/literature along with all the possible adverse reactions/side effects for all the Medicines you take and that have been prescribed to you. Take any new Medicines after you have completely understood and accpet all the possible adverse reactions/side effects.   Please note  You were cared for by a hospitalist during your hospital stay. If you have any questions about your discharge medications or the care you received while you were in the  hospital after you are discharged, you can call the unit and asked to speak with the hospitalist on call if the hospitalist that took care of you is not available. Once you are discharged, your primary care physician will handle any further medical issues. Please note that NO REFILLS for any discharge medications will be authorized once you are discharged, as it is imperative that you return to your primary care physician (or establish a relationship with a primary care physician if you do not have one) for your aftercare needs so that they can reassess your need for medications and monitor your lab values.     Today   No acute events overnight, blood pressure significantly improved since yesterday.  Will discharge to skilled nursing facility today.  VITAL SIGNS:  Blood pressure (!) 138/56, pulse (!) 59, temperature 97.9 F (36.6 C), resp. rate 18, height 5\' 11"  (1.803 m),  weight 67.6 kg, SpO2 99 %.  I/O:    Intake/Output Summary (Last 24 hours) at 04/30/2019 1424 Last data filed at 04/30/2019 1415 Gross per 24 hour  Intake 2990.32 ml  Output 400 ml  Net 2590.32 ml    PHYSICAL EXAMINATION:  GENERAL:  68 y.o.-year-old patient lying in the bed with no acute distress.  EYES: Pupils equal, round, reactive to light and accommodation. No scleral icterus. Extraocular muscles intact.  HEENT: Head atraumatic, normocephalic. Oropharynx and nasopharynx clear.  NECK:  Supple, no jugular venous distention. No thyroid enlargement, no tenderness.  LUNGS: Normal breath sounds bilaterally, no wheezing, rales,rhonchi. No use of accessory muscles of respiration.  CARDIOVASCULAR: S1, S2 normal. No murmurs, rubs, or gallops.  ABDOMEN: Soft, non-tender, non-distended. Bowel sounds present. No organomegaly or mass.  EXTREMITIES: No pedal edema, cyanosis, or clubbing.  NEUROLOGIC: Cranial nerves II through XII are intact. No focal motor or sensory defecits b/l. Globally weak.   PSYCHIATRIC: The patient is alert and oriented x 2.  SKIN: No obvious rash, lesion, or ulcer.   DATA REVIEW:   CBC Recent Labs  Lab 04/30/19 0511  WBC 3.7*  HGB 8.5*  HCT 26.3*  PLT 91*    Chemistries  Recent Labs  Lab 04/24/19 1349  04/30/19 0511  NA 140   < > 142  K 3.7   < > 4.3  CL 108   < > 113*  CO2 23   < > 25  GLUCOSE 232*   < > 141*  BUN 34*   < > 30*  CREATININE 2.85*   < > 2.27*  CALCIUM 7.8*   < > 7.8*  MG  --    < > 1.8  AST 27  --   --   ALT 14  --   --   ALKPHOS 65  --   --   BILITOT 0.5  --   --    < > = values in this interval not displayed.    Cardiac Enzymes No results for input(s): TROPONINI in the last 168 hours.  Microbiology Results  Results for orders placed or performed during the hospital encounter of 04/24/19  SARS Coronavirus 2 (CEPHEID - Performed in [facility] hospital lab), Hosp Order     Status: None   Collection Time: 04/24/19  2:57 PM    Specimen: Nasopharyngeal Swab  Result Value Ref Range Status   SARS Coronavirus 2 NEGATIVE NEGATIVE Final    Comment: (NOTE) If result is NEGATIVE SARS-CoV-2 target nucleic acids are NOT DETECTED. The SARS-CoV-2 RNA is generally detectable in upper and  lower  respiratory specimens during the acute phase of infection. The lowest  concentration of SARS-CoV-2 viral copies this assay can detect is 250  copies / mL. A negative result does not preclude SARS-CoV-2 infection  and should not be used as the sole basis for treatment or other  patient management decisions.  A negative result may occur with  improper specimen collection / handling, submission of specimen other  than nasopharyngeal swab, presence of viral mutation(s) within the  areas targeted by this assay, and inadequate number of viral copies  (<250 copies / mL). A negative result must be combined with clinical  observations, patient history, and epidemiological information. If result is POSITIVE SARS-CoV-2 target nucleic acids are DETECTED. The SARS-CoV-2 RNA is generally detectable in upper and lower  respiratory specimens dur ing the acute phase of infection.  Positive  results are indicative of active infection with SARS-CoV-2.  Clinical  correlation with patient history and other diagnostic information is  necessary to determine patient infection status.  Positive results do  not rule out bacterial infection or co-infection with other viruses. If result is PRESUMPTIVE POSTIVE SARS-CoV-2 nucleic acids MAY BE PRESENT.   A presumptive positive result was obtained on the submitted specimen  and confirmed on repeat testing.  While 2019 novel coronavirus  (SARS-CoV-2) nucleic acids may be present in the submitted sample  additional confirmatory testing may be necessary for epidemiological  and / or clinical management purposes  to differentiate between  SARS-CoV-2 and other Sarbecovirus currently known to infect humans.  If  clinically indicated additional testing with an alternate test  methodology [phone]) is advised. The SARS-CoV-2 RNA is generally  detectable in upper and lower respiratory sp ecimens during the acute  phase of infection. The expected result is Negative. Fact Sheet for Patients:  StrictlyIdeas.no Fact Sheet for Healthcare Providers: BankingDealers.co.za This test is not yet approved or cleared by the [place] FDA and has been authorized for detection and/or diagnosis of SARS-CoV-2 by FDA under an Emergency Use Authorization (EUA).  This EUA will remain in effect (meaning this test can be used) for the duration of the COVID-19 declaration under Section 564(b)(1) of the Act, 21 U.S.C. section 360bbb-3(b)(1), unless the authorization is terminated or revoked sooner. Performed at [facility], [address] [place], [place] [address]   Urine culture     Status: None   Collection Time: 04/24/19  5:12 PM   Specimen: Urine, Random  Result Value Ref Range Status   Specimen Description   Final    URINE, RANDOM Performed at [facility], [address] [place], [place] [address]    Special Requests   Final    NONE Performed at [facility], [address], [place], [place] [address]    Culture   Final    NO GROWTH Performed at [facility], [place] [address] [place], [place] [address]    Report Status 04/26/2019 FINAL  Final  Urine Culture     Status: Abnormal   Collection Time: 04/26/19  8:20 PM   Specimen: Urine, Catheterized  Result Value Ref Range Status   Specimen Description   Final    URINE, CATHETERIZED Performed at [facility], [address], [place], [place] [address]    Special Requests   Final    NONE Performed at [facility], [place], [place] [address]    Culture 60,000 COLONIES/mL YEAST (A)  Final   Report Status 04/28/2019  FINAL  Final   SARS Coronavirus 2 (CEPHEID - Performed in [facility] hospital lab), Hosp Order     Status: None   Collection Time: 04/28/19  4:08 PM   Specimen: Nasopharyngeal Swab  Result Value Ref Range Status   SARS Coronavirus 2 NEGATIVE NEGATIVE Final    Comment: (NOTE) If result is NEGATIVE SARS-CoV-2 target nucleic acids are NOT DETECTED. The SARS-CoV-2 RNA is generally detectable in upper and lower  respiratory specimens during the acute phase of infection. The lowest  concentration of SARS-CoV-2 viral copies this assay can detect is 250  copies / mL. A negative result does not preclude SARS-CoV-2 infection  and should not be used as the sole basis for treatment or other  patient management decisions.  A negative result may occur with  improper specimen collection / handling, submission of specimen other  than nasopharyngeal swab, presence of viral mutation(s) within the  areas targeted by this assay, and inadequate number of viral copies  (<250 copies / mL). A negative result must be combined with clinical  observations, patient history, and epidemiological information. If result is POSITIVE SARS-CoV-2 target nucleic acids are DETECTED. The SARS-CoV-2 RNA is generally detectable in upper and lower  respiratory specimens dur ing the acute phase of infection.  Positive  results are indicative of active infection with SARS-CoV-2.  Clinical  correlation with patient history and other diagnostic information is  necessary to determine patient infection status.  Positive results do  not rule out bacterial infection or co-infection with other viruses. If result is PRESUMPTIVE POSTIVE SARS-CoV-2 nucleic acids MAY BE PRESENT.   A presumptive positive result was obtained on the submitted specimen  and confirmed on repeat testing.  While 2019 novel coronavirus  (SARS-CoV-2) nucleic acids may be present in the submitted sample  additional confirmatory testing may be necessary for epidemiological   and / or clinical management purposes  to differentiate between  SARS-CoV-2 and other Sarbecovirus currently known to infect humans.  If clinically indicated additional testing with an alternate test  methodology [phone]) is advised. The SARS-CoV-2 RNA is generally  detectable in upper and lower respiratory sp ecimens during the acute  phase of infection. The expected result is Negative. Fact Sheet for Patients:  StrictlyIdeas.no Fact Sheet for Healthcare Providers: BankingDealers.co.za This test is not yet approved or cleared by the [place] FDA and has been authorized for detection and/or diagnosis of SARS-CoV-2 by FDA under an Emergency Use Authorization (EUA).  This EUA will remain in effect (meaning this test can be used) for the duration of the COVID-19 declaration under Section 564(b)(1) of the Act, 21 U.S.C. section 360bbb-3(b)(1), unless the authorization is terminated or revoked sooner. Performed at [facility], [address], [place], [place] [address]     RADIOLOGY:  No results found.    Management plans discussed with the patient, family and they are in agreement.  CODE STATUS:     Code Status Orders  (From admission, onward)         Start     Ordered   04/24/19 1642  Full code  Continuous     04/24/19 1641        TOTAL TIME TAKING CARE OF THIS PATIENT: 40 minutes.    Henreitta Leber M.D on 04/30/2019 at 2:24 PM  Between 7am to 6pm - Pager - [phone]  After 6pm go to [email or web address] - [facility]  Office  [phone]  CC: Primary  care physician; [name], MD

## 2019-04-30 NOTE — TOC Transition Note (Signed)
Transition of Care [facility]) - CM/SW Discharge Note   Patient Details  Name: Cameron Adams MRN: [id number] Date of Birth: 05/18/1951  [facility] CM/SW Contact:  Weston Anna, LCSW Phone Number: 04/30/2019, 11:21 AM   Clinical Narrative:     CSW spoke with patients daughter, [name], via phone to notify her of discharge plans for today. Patient will be going [facility]. Please call report to [phone]. Patient will need transportation via EMS. DC summary faxed to facility.       Patient Goals and CMS Choice        Discharge Placement                       Discharge Plan and Services In-house Referral: Clinical Social Work                                   Social Determinants of Health (SDOH) Interventions     Readmission Risk Interventions Readmission Risk Prevention Plan 04/26/2019 01/16/2019  Transportation Screening Complete Complete  PCP or Specialist Appt within 3-5 Days - Complete  HRI or [place] - Complete  Social Work Consult for [place] Planning/Counseling - Complete  Palliative Care Screening - Not Applicable  Medication Review [employer]) Complete Complete  HRI or Home Care Consult Complete -  SW Recovery Care/Counseling Consult Complete -  Palliative Care Screening Not Applicable -  [place] Patient Refused -  Some recent data might be hidden

## 2019-05-02 IMAGING — CT CT HEAD CODE STROKE W/O CM
4 of 9 series · 16 of 47 positions shown, 18 images · non-contrast
Comparison: 01/12/2019

CLINICAL DATA: Code stroke. Altered level of consciousness. Recent
syncopal episodes.

EXAM:
CT HEAD WITHOUT CONTRAST
TECHNIQUE: Contiguous axial images were obtained from the base of the skull
through the vertex without intravenous contrast.

[Series 2: head 5.0 h31s · axial · 0.65mm/px · z∈[+953,+1068]mm · 5 of 35 slices shown, 7 images (1 of 2)]
[im 6/35  brain]
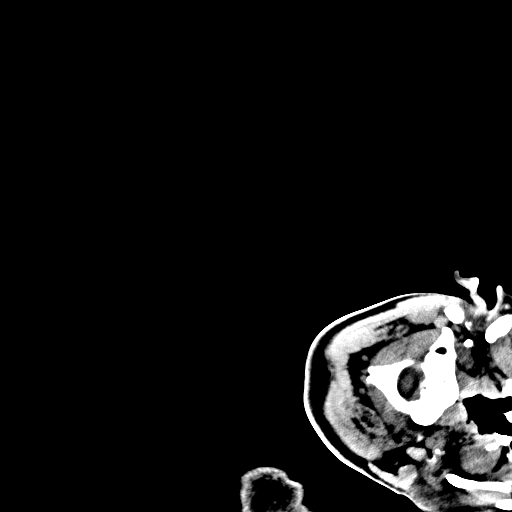
[im 6/35  bone]
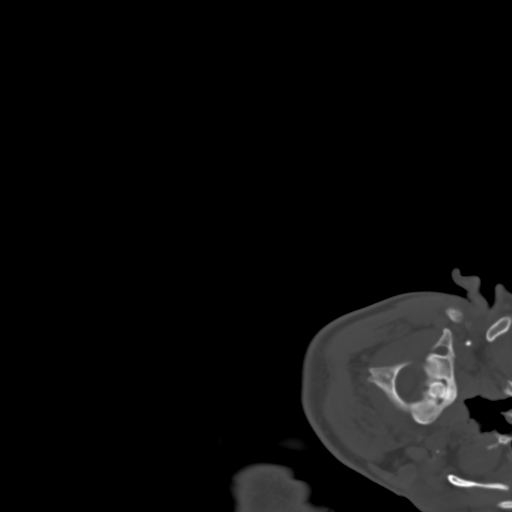
[im 12/35  brain]
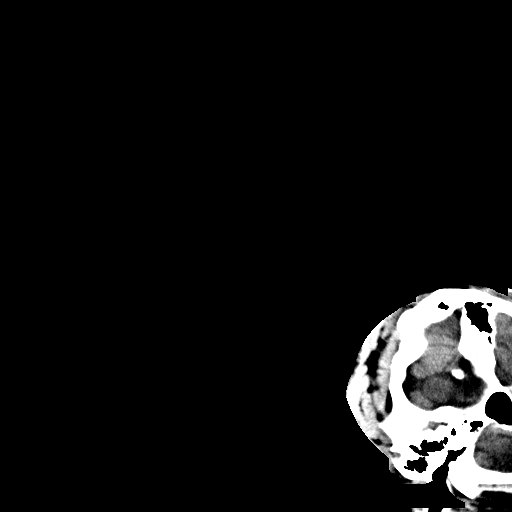
[im 18/35  brain]
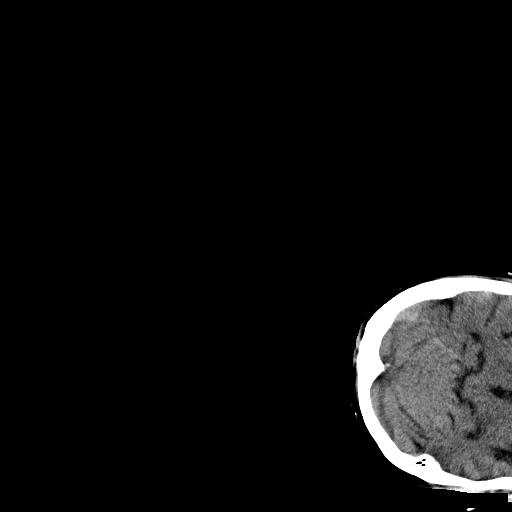
[im 23/35  brain]
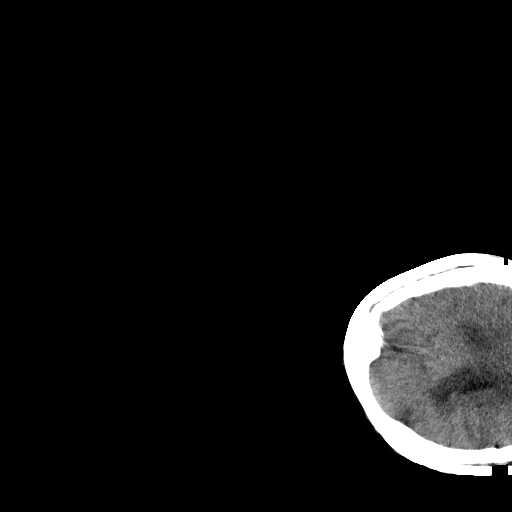
[im 29/35  brain]
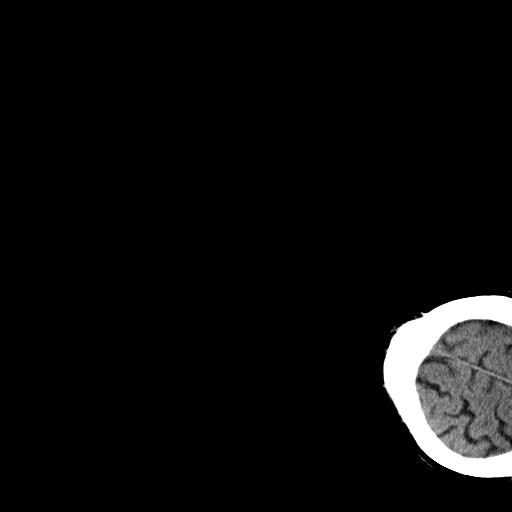
[im 29/35  bone]
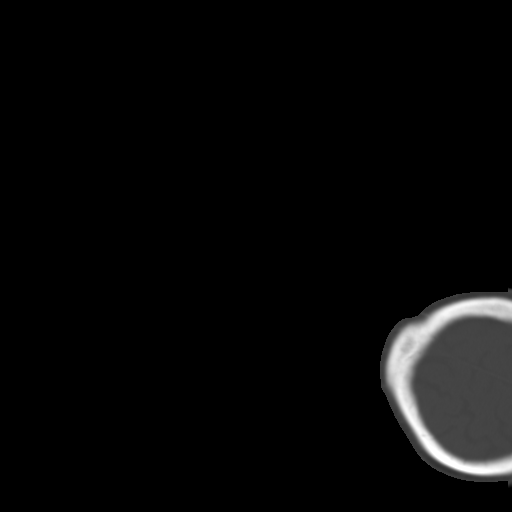

[Series 3: head 5.0 h31s · axial · 0.65mm/px · z∈[+953,+1068]mm · 5 of 35 slices shown (2 of 2)]
[im 6/35  brain]
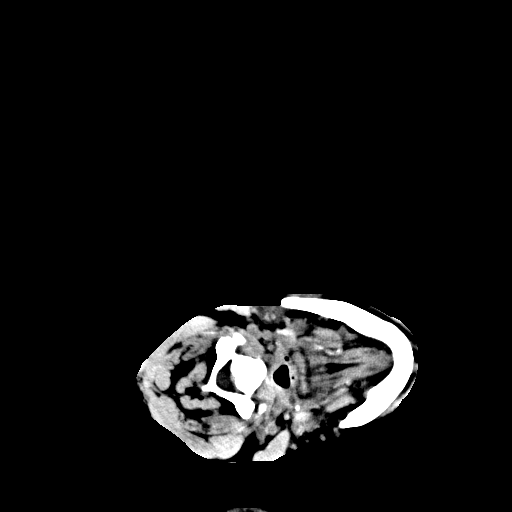
[im 12/35  brain]
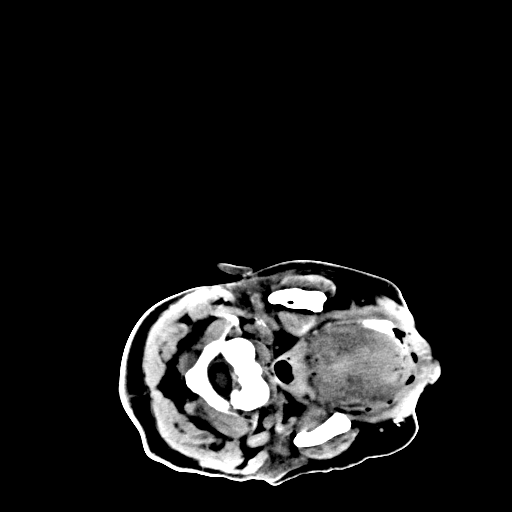
[im 18/35  brain]
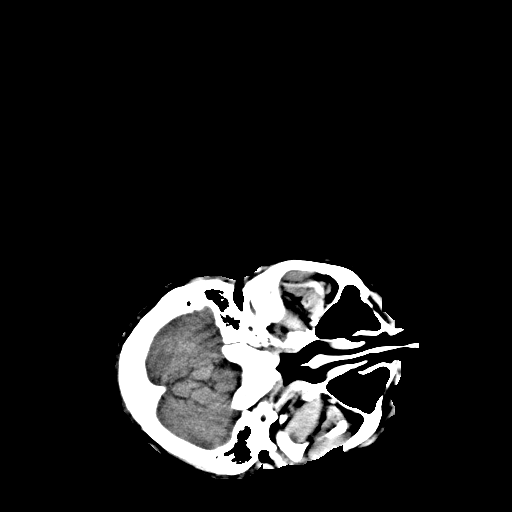
[im 23/35  brain]
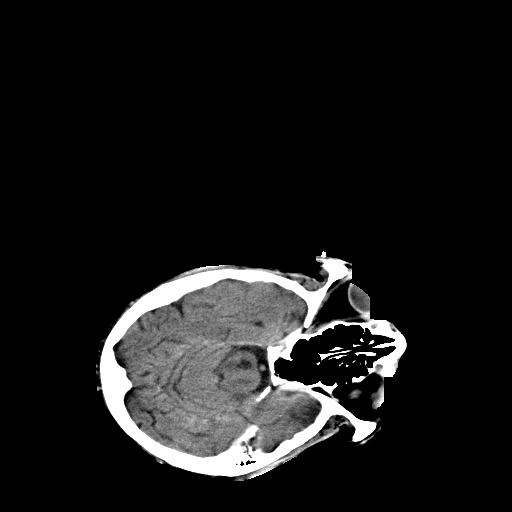
[im 29/35  brain]
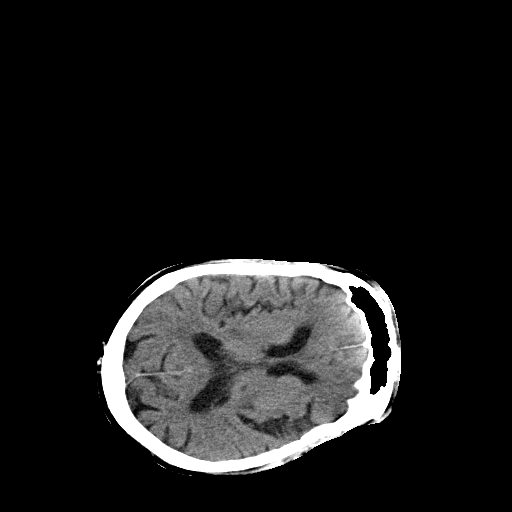

[Series 603: coro · coronal · 0.65mm/px · 3 of 100 slices shown]
[im 53/100  brain]
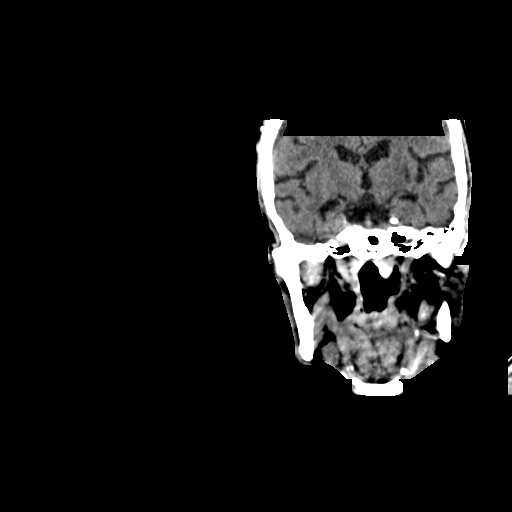
[im 62/100  brain]
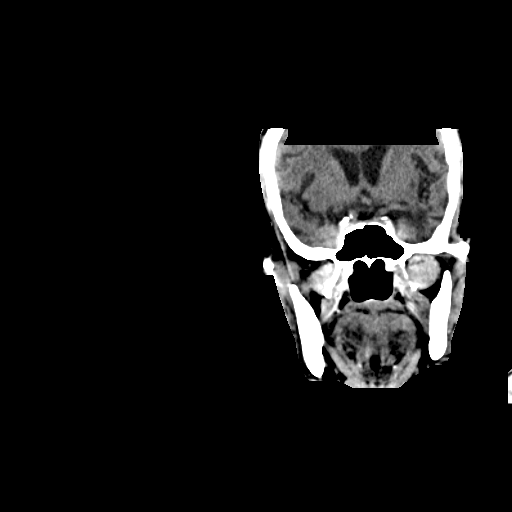
[im 72/100  brain]
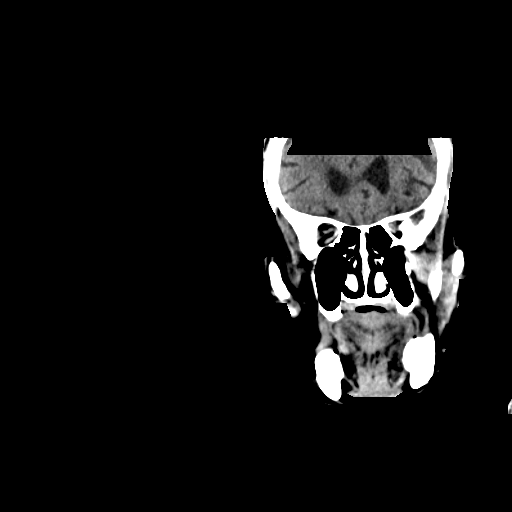

[Series 604: sag · sagittal · 0.65mm/px · 3 of 76 slices shown]
[im 16/76  brain]
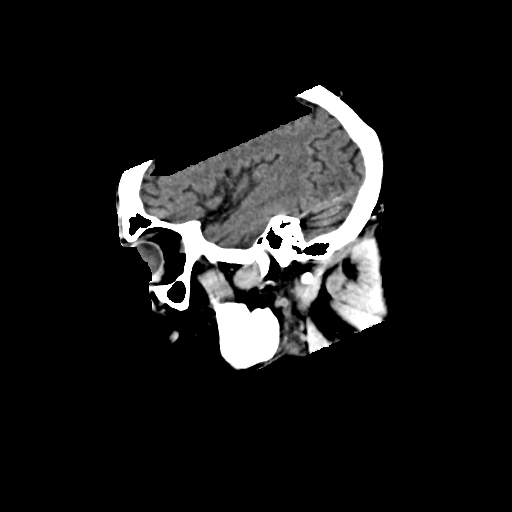
[im 31/76  brain]
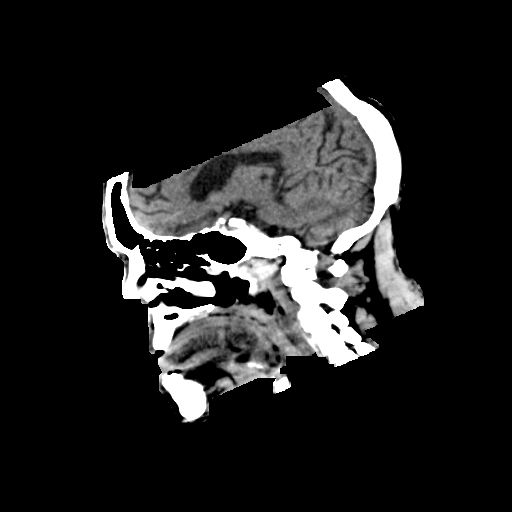
[im 46/76  brain]
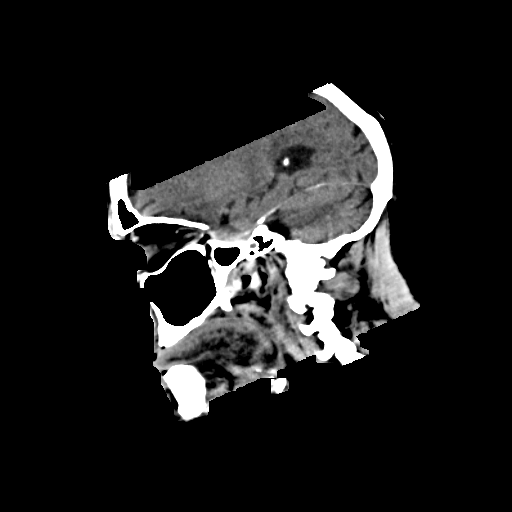

[16 of 47 positions shown; findings below may reference images not displayed]

FINDINGS: Brain: There is no evidence of acute infarct, intracranial
hemorrhage, mass, midline shift, or extra-axial fluid collection.
Small chronic infarcts are again seen in the right pons, bilateral
thalami, and posterior left lentiform nucleus/external capsule.
There is mild cerebral atrophy. Cerebral white matter hypodensities
are unchanged and nonspecific but compatible with mild chronic small
vessel ischemic disease. Encephalomalacia is again noted in the
anterior left temporal lobe.

Vascular: Calcified atherosclerosis at the skull base. No hyperdense
vessel.

Skull: No fracture or focal osseous lesion.

Sinuses/Orbits: Paranasal sinuses and mastoid air cells are clear.
Right cataract extraction. Left phthisis bulbi.

Other: None.

ASPECTS ([place]ta Stroke Program Early CT Score)

Not scored with this history.
IMPRESSION: 1. No evidence of acute intracranial abnormality.
2. Chronic small vessel ischemic disease with old lacunar infarcts
and left temporal lobe encephalomalacia.

These results were called by telephone at the time of interpretation
on 01/13/2019 at [DATE] to Dr. Apologies, who verbally acknowledged
these results.

## 2019-05-02 NOTE — Progress Notes (Signed)
05/03/2019 8:54 AM   Cameron Adams [birth year] [id number]  Referring provider: [name], MD [place] [facility] [place],  [place] [id number]  Chief Complaint  Patient presents with  . Urinary Retention    HPI: [name] is a 68 year old male who was admitted for altered mental status and developed urinary retention while hospitalized with CMA, Cameron Adams.    Foley catheter was placed on 04/25/2019 with a PVR of 600 cc.  He was started on Flomax at that time.    Labs at discharge: serum creatinine 2.27, UA 11-20 RBC's and 21-50 WBC's.  1st urine culture was negative.  2nd urine culture grew out yeast - likely a contaminate.     Meds given in the hospital:  Flomax   Prior urological history:  RUS on 12/04/2018 noted a left renal cyst and bladder wall thickening.     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     Today, he has no complaints.  Patient denies any gross hematuria, dysuria or suprapubic/flank pain.  Patient denies any fevers, chills, nausea or vomiting.   Foley in place draining clear yellow urine.     PMH: Past Medical History:  Diagnosis Date  . Anemia of chronic disease   . Blindness of left eye   . Chronic kidney disease, stage III (moderate) (HCC)   . Diabetes ([place])   . Diarrhea   . Diastolic dysfunction    a. 06/2017 Echo: EF 55-60%, no rwma, Gr2 DD, Asc Ao 3.9cm, nl RV fxn, nl PASP.  Marland Kitchen Hypertension   . Hypothyroidism   . Monoclonal gammopathy of unknown significance (MGUS)    a. prev seen @ [facility].  Lost to f/u - never had BM bx.  . Orthostatic hypotension   . Syncope and collapse     Surgical History: Past Surgical History:  Procedure Laterality Date  . 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  PERIPHERAL VASCULAR CATHETERIZATION N/A 09/15/2016   Procedure: Lower Extremity Angiography;  Surgeon: Algernon Huxley, MD;  Location: [facility];  Service: Cardiovascular;  Laterality: N/A;  . THYROID SURGERY      Home Medications:  Allergies as of 05/03/2019      Reactions   Gabapentin    Other reaction(s): Other (See Comments) Trouble sleeping       Medication List       Accurate as of May 03, 2019  8:54 AM. If you have any questions, ask your nurse or doctor.        acetaminophen 500 MG tablet Commonly known as: TYLENOL Take 500-1,000 mg by mouth every 6 (six) hours as needed for mild pain, moderate pain or fever.   acidophilus Caps capsule Take 1 capsule by mouth daily.   amLODipine 10 MG tablet Commonly known as: NORVASC Take 1 tablet (10 mg total) by mouth daily.   aspirin EC 81 MG tablet Take 81 mg by mouth daily.   atorvastatin 40 MG tablet Commonly known as: LIPITOR Take 40 mg by mouth at bedtime.   citalopram 20 MG tablet Commonly known as: CELEXA Take 20 mg by mouth daily.   clopidogrel 75 MG tablet Commonly known as: Plavix Take 1 tablet (75 mg total) by mouth daily.   Creon 24000-76000 units  Cpep Generic drug: Pancrelipase (Lip-Prot-Amyl) Take 48,000 Units by mouth 3 (three) times daily with meals.   divalproex 500 MG 24 hr tablet Commonly known as: DEPAKOTE ER Take 1 tablet (500 mg total) by mouth 2 (two) times daily.   glipiZIDE 5 MG tablet Commonly known as: GLUCOTROL Take 2.5 mg by mouth 2 (two) times daily.   hydrOXYzine 10 MG tablet Commonly known as: ATARAX/VISTARIL Take 1 tablet (10 mg total) by mouth every 8 (eight) hours as needed for anxiety.   levothyroxine 100 MCG tablet Commonly known as: SYNTHROID Take 1 tablet (100 mcg total) by mouth daily before breakfast. What changed: how much to take   loperamide 2 MG capsule Commonly known as: IMODIUM Take 1 capsule (2 mg total) by mouth every 6 (six) hours as needed for  diarrhea or loose stools.   metoprolol tartrate 25 MG tablet Commonly known as: LOPRESSOR Take 1 tablet (25 mg total) by mouth 2 (two) times daily.   sodium bicarbonate 650 MG tablet Take 650 mg by mouth 2 (two) times daily.   tamsulosin 0.4 MG Caps capsule Commonly known as: FLOMAX Take 1 capsule (0.4 mg total) by mouth daily.   Vitamin D-1000 Max St 25 MCG (1000 UT) tablet Generic drug: Cholecalciferol Take 2,000 Units by mouth daily.       Allergies:  Allergies  Allergen Reactions  . Gabapentin     Other reaction(s): Other (See Comments) Trouble sleeping     Family History: Family History  Problem Relation Age of Onset  . Kidney failure Mother   . Heart disease Father   . Kidney failure Father   . Heart disease Brother     Social History:  reports that he quit smoking about 40 years ago. His smoking use included cigarettes. He has a 2.50 pack-year smoking history. He has never used smokeless tobacco. He reports previous alcohol use. He reports that he does not use drugs.  ROS: UROLOGY Frequent Urination?: No Hard to postpone urination?: No Burning/pain with urination?: No Get up at night to urinate?: No Leakage of urine?: No Urine stream starts and stops?: No Trouble starting stream?: No Do you have to strain to urinate?: No Blood in urine?: No Urinary tract infection?: No Sexually transmitted disease?: No Injury to kidneys or bladder?: No Painful intercourse?: No Weak stream?: No Erection problems?: No Penile pain?: No  Gastrointestinal Nausea?: No Vomiting?: No Indigestion/heartburn?: No Diarrhea?: No Constipation?: No  Constitutional Fever: No Night sweats?: No Weight loss?: No Fatigue?: No  Skin Skin rash/lesions?: No Itching?: No  Eyes Blurred vision?: No Double vision?: No  Ears/Nose/Throat Sore throat?: No Sinus problems?: No  Hematologic/Lymphatic Swollen glands?: No Easy bruising?: No  Cardiovascular Leg swelling?: No  Chest pain?: No  Respiratory Cough?: No Shortness of breath?: No  Endocrine Excessive thirst?: No  Musculoskeletal Back pain?: No Joint pain?: No  Neurological Headaches?: No Dizziness?: No  Psychologic Depression?: No Anxiety?: No  Physical Exam: BP (!) 169/78 (BP Location: Left Arm, Patient Position: Sitting, Cuff Size: Normal)   Pulse (!) 57   Ht 5\' 11"  (1.803 m)   Wt 150 lb (68 kg)   BMI 20.92 kg/m   Constitutional:  Well nourished. Alert and oriented, No acute distress. HEENT: [place] AT, moist mucus membranes.  Trachea midline, no masses. Cardiovascular: No clubbing, cyanosis, or edema. Respiratory: Normal respiratory effort, no increased work of breathing. GI: Abdomen is soft, non tender, non distended, no abdominal masses. Liver and spleen not palpable.  No hernias  appreciated.  Stool sample for occult testing is not indicated.   GU: No CVA tenderness.  No bladder fullness or masses.  Patient with circumcised phallus.   Urethral meatus is patent.  No penile discharge. No penile lesions or rashes. Scrotum without lesions, cysts, rashes and/or edema.  Testicles are located scrotally bilaterally. No masses are appreciated in the testicles. Left and right epididymis are normal. Rectal: Patient with  normal sphincter tone. Anus and perineum without scarring or rashes. No rectal masses are appreciated. Prostate is approximately 45 grams, firm, right lobe > left lobe, no nodules are appreciated.  Skin: No rashes, bruises or suspicious lesions. Lymph: No inguinal adenopathy. Neurologic: Grossly intact, no focal deficits, moving all 4 extremities. Psychiatric: Normal mood and affect.  Laboratory Data: Lab Results  Component Value Date   WBC 3.7 (L) 04/30/2019   HGB 8.5 (L) 04/30/2019   HCT 26.3 (L) 04/30/2019   MCV 90.4 04/30/2019   PLT 91 (L) 04/30/2019    Lab Results  Component Value Date   CREATININE 2.27 (H) 04/30/2019    No results found for: PSA  No results  found for: TESTOSTERONE  Lab Results  Component Value Date   HGBA1C 7.2 (H) 12/05/2018    Lab Results  Component Value Date   TSH 5.188 (H) 04/24/2019       Component Value Date/Time   CHOL 102 12/05/2018 0453   HDL 48 12/05/2018 0453   CHOLHDL 2.1 12/05/2018 0453   VLDL 8 12/05/2018 0453   LDLCALC 46 12/05/2018 0453    Lab Results  Component Value Date   AST 27 04/24/2019   Lab Results  Component Value Date   ALT 14 04/24/2019   No components found for: ALKALINEPHOPHATASE No components found for: BILIRUBINTOTAL  No results found for: ESTRADIOL  Urinalysis    Component Value Date/Time   COLORURINE YELLOW (A) 04/26/2019 2020   APPEARANCEUR CLOUDY (A) 04/26/2019 2020   LABSPEC 1.011 04/26/2019 2020   PHURINE 5.0 04/26/2019 2020   GLUCOSEU 50 (A) 04/26/2019 2020   HGBUR MODERATE (A) 04/26/2019 2020   BILIRUBINUR NEGATIVE 04/26/2019 2020   [place] NEGATIVE 04/26/2019 2020   PROTEINUR 100 (A) 04/26/2019 2020   NITRITE NEGATIVE 04/26/2019 2020   LEUKOCYTESUR LARGE (A) 04/26/2019 2020    I have reviewed the labs.   Pertinent Imaging: CLINICAL DATA:  Acute renal injury  EXAM: RENAL / URINARY TRACT ULTRASOUND COMPLETE  COMPARISON:  06/29/2017  FINDINGS: Right Kidney:  Renal measurements: 9.6 x 5.8 x 5.0 cm = volume: 147 mL . Echogenicity within normal limits. No mass or hydronephrosis visualized.  Left Kidney:  Renal measurements: 9.4 x 5.4 x 5.4 cm. = volume: 145 mL. No hydronephrosis is noted. There is a 1.6 cm cyst in the lower pole of the left kidney. This is stable from the prior exam.  Bladder:  Mild thickening of the bladder wall is noted although this is felt to be related to incomplete distension.  IMPRESSION: Stable left renal cyst.  No acute abnormality noted.   Electronically Signed   By: Inez Catalina M.D.   On: 12/04/2018 07:44  I have independently reviewed the films.    Procedure Catheter Removal Patient is  present today for a catheter removal.  10 ml of water was drained from the balloon. A 16 FR foley cath was removed from the bladder no complications were noted . Patient tolerated well.  Performed by: Zara Council, PA-C     Assessment & Plan:  1. Acute urinary retention:    - foley catheter removed -voiding trial today   -return this afternoon for bladder scan -follow-up in one week for I PSS score, PVR and exam   2. Microscopic hematuria - will need to reassess at follow up visit - if persists will need to pursue hematuria work up - will recheck UA in one week   3. Bladder wall thickening - see above  4. BPH with LUTS Continue conservative management, avoiding bladder irritants and timed voiding's Most bothersome symptoms is/are retention Continue tamsulosin 0.4 mg daily:refills RTC in one week months for I PSS and exam    Return for Return this afternoon for a bladder scan .  These notes generated with voice recognition software. I apologize for typographical errors.  Zara Council, PA-C  [facility] [address]  [place], [place] [address] [phone]

## 2019-05-03 ENCOUNTER — Ambulatory Visit (INDEPENDENT_AMBULATORY_CARE_PROVIDER_SITE_OTHER): Payer: Medicare Other | Admitting: Urology

## 2019-05-03 ENCOUNTER — Encounter: Payer: Self-pay | Admitting: Urology

## 2019-05-03 ENCOUNTER — Other Ambulatory Visit: Payer: Self-pay

## 2019-05-03 VITALS — BP 169/78 | HR 57 | Ht 71.0 in | Wt 150.0 lb

## 2019-05-03 DIAGNOSIS — N401 Enlarged prostate with lower urinary tract symptoms: Secondary | ICD-10-CM | POA: Diagnosis not present

## 2019-05-03 DIAGNOSIS — I639 Cerebral infarction, unspecified: Secondary | ICD-10-CM | POA: Diagnosis not present

## 2019-05-03 DIAGNOSIS — R338 Other retention of urine: Secondary | ICD-10-CM | POA: Diagnosis not present

## 2019-05-03 DIAGNOSIS — R3129 Other microscopic hematuria: Secondary | ICD-10-CM | POA: Diagnosis not present

## 2019-05-03 DIAGNOSIS — N138 Other obstructive and reflux uropathy: Secondary | ICD-10-CM

## 2019-05-03 DIAGNOSIS — N3289 Other specified disorders of bladder: Secondary | ICD-10-CM | POA: Diagnosis not present

## 2019-05-04 ENCOUNTER — Other Ambulatory Visit
Admission: RE | Admit: 2019-05-04 | Discharge: 2019-05-04 | Disposition: A | Discharge status: Home / Self Care | Payer: No Typology Code available for payment source | Source: Ambulatory Visit | Attending: Family Medicine | Admitting: Family Medicine

## 2019-05-04 DIAGNOSIS — N390 Urinary tract infection, site not specified: Secondary | ICD-10-CM | POA: Diagnosis present

## 2019-05-04 LAB — URINALYSIS, COMPLETE (UACMP) WITH MICROSCOPIC
Bacteria, UA: NONE SEEN
Bilirubin Urine: NEGATIVE
Glucose, UA: 100 mg/dL — AB
Ketones, ur: NEGATIVE mg/dL
Nitrite: NEGATIVE
Protein, ur: 100 mg/dL — AB
Specific Gravity, Urine: 1.01 (ref 1.005–1.030)
pH: 5.5 (ref 5.0–8.0)

## 2019-05-05 LAB — URINE CULTURE: Culture: NO GROWTH

## 2019-05-09 ENCOUNTER — Inpatient Hospital Stay
Admission: EM | Admit: 2019-05-09 | Discharge: 2019-05-21 | DRG: 368 | Disposition: A | Discharge status: Skilled Nursing Facility | Payer: Medicare Other | Attending: Internal Medicine | Admitting: Internal Medicine

## 2019-05-09 ENCOUNTER — Other Ambulatory Visit: Payer: Self-pay

## 2019-05-09 ENCOUNTER — Emergency Department: Payer: Medicare Other

## 2019-05-09 DIAGNOSIS — D509 Iron deficiency anemia, unspecified: Secondary | ICD-10-CM | POA: Diagnosis present

## 2019-05-09 DIAGNOSIS — E875 Hyperkalemia: Secondary | ICD-10-CM | POA: Diagnosis present

## 2019-05-09 DIAGNOSIS — Z20828 Contact with and (suspected) exposure to other viral communicable diseases: Secondary | ICD-10-CM | POA: Diagnosis present

## 2019-05-09 DIAGNOSIS — Z794 Long term (current) use of insulin: Secondary | ICD-10-CM

## 2019-05-09 DIAGNOSIS — E872 Acidosis: Secondary | ICD-10-CM | POA: Diagnosis present

## 2019-05-09 DIAGNOSIS — E11319 Type 2 diabetes mellitus with unspecified diabetic retinopathy without macular edema: Secondary | ICD-10-CM | POA: Diagnosis present

## 2019-05-09 DIAGNOSIS — E860 Dehydration: Secondary | ICD-10-CM | POA: Diagnosis present

## 2019-05-09 DIAGNOSIS — N179 Acute kidney failure, unspecified: Secondary | ICD-10-CM | POA: Diagnosis present

## 2019-05-09 DIAGNOSIS — A0472 Enterocolitis due to Clostridium difficile, not specified as recurrent: Secondary | ICD-10-CM | POA: Diagnosis present

## 2019-05-09 DIAGNOSIS — D649 Anemia, unspecified: Secondary | ICD-10-CM

## 2019-05-09 DIAGNOSIS — N184 Chronic kidney disease, stage 4 (severe): Secondary | ICD-10-CM | POA: Diagnosis present

## 2019-05-09 DIAGNOSIS — G9341 Metabolic encephalopathy: Secondary | ICD-10-CM | POA: Diagnosis present

## 2019-05-09 DIAGNOSIS — E1122 Type 2 diabetes mellitus with diabetic chronic kidney disease: Secondary | ICD-10-CM | POA: Diagnosis present

## 2019-05-09 DIAGNOSIS — F411 Generalized anxiety disorder: Secondary | ICD-10-CM | POA: Diagnosis present

## 2019-05-09 DIAGNOSIS — D631 Anemia in chronic kidney disease: Secondary | ICD-10-CM | POA: Diagnosis present

## 2019-05-09 DIAGNOSIS — E1151 Type 2 diabetes mellitus with diabetic peripheral angiopathy without gangrene: Secondary | ICD-10-CM | POA: Diagnosis present

## 2019-05-09 DIAGNOSIS — D62 Acute posthemorrhagic anemia: Secondary | ICD-10-CM | POA: Diagnosis present

## 2019-05-09 DIAGNOSIS — Z841 Family history of disorders of kidney and ureter: Secondary | ICD-10-CM

## 2019-05-09 DIAGNOSIS — E11649 Type 2 diabetes mellitus with hypoglycemia without coma: Secondary | ICD-10-CM | POA: Diagnosis not present

## 2019-05-09 DIAGNOSIS — R269 Unspecified abnormalities of gait and mobility: Secondary | ICD-10-CM | POA: Diagnosis present

## 2019-05-09 DIAGNOSIS — Z888 Allergy status to other drugs, medicaments and biological substances status: Secondary | ICD-10-CM

## 2019-05-09 DIAGNOSIS — R001 Bradycardia, unspecified: Secondary | ICD-10-CM | POA: Diagnosis not present

## 2019-05-09 DIAGNOSIS — H5711 Ocular pain, right eye: Secondary | ICD-10-CM | POA: Diagnosis present

## 2019-05-09 DIAGNOSIS — W01118A Fall on same level from slipping, tripping and stumbling with subsequent striking against other sharp object, initial encounter: Secondary | ICD-10-CM | POA: Diagnosis present

## 2019-05-09 DIAGNOSIS — D472 Monoclonal gammopathy: Secondary | ICD-10-CM | POA: Diagnosis present

## 2019-05-09 DIAGNOSIS — W19XXXA Unspecified fall, initial encounter: Secondary | ICD-10-CM

## 2019-05-09 DIAGNOSIS — F3341 Major depressive disorder, recurrent, in partial remission: Secondary | ICD-10-CM | POA: Diagnosis present

## 2019-05-09 DIAGNOSIS — Z970 Presence of artificial eye: Secondary | ICD-10-CM

## 2019-05-09 DIAGNOSIS — H44522 Atrophy of globe, left eye: Secondary | ICD-10-CM | POA: Diagnosis present

## 2019-05-09 DIAGNOSIS — Z89421 Acquired absence of other right toe(s): Secondary | ICD-10-CM

## 2019-05-09 DIAGNOSIS — K226 Gastro-esophageal laceration-hemorrhage syndrome: Principal | ICD-10-CM | POA: Diagnosis present

## 2019-05-09 DIAGNOSIS — Z79890 Hormone replacement therapy (postmenopausal): Secondary | ICD-10-CM

## 2019-05-09 DIAGNOSIS — E039 Hypothyroidism, unspecified: Secondary | ICD-10-CM | POA: Diagnosis present

## 2019-05-09 DIAGNOSIS — I130 Hypertensive heart and chronic kidney disease with heart failure and stage 1 through stage 4 chronic kidney disease, or unspecified chronic kidney disease: Secondary | ICD-10-CM | POA: Diagnosis present

## 2019-05-09 DIAGNOSIS — Z87891 Personal history of nicotine dependence: Secondary | ICD-10-CM

## 2019-05-09 DIAGNOSIS — Z7902 Long term (current) use of antithrombotics/antiplatelets: Secondary | ICD-10-CM

## 2019-05-09 DIAGNOSIS — Z8249 Family history of ischemic heart disease and other diseases of the circulatory system: Secondary | ICD-10-CM

## 2019-05-09 DIAGNOSIS — R569 Unspecified convulsions: Secondary | ICD-10-CM | POA: Diagnosis present

## 2019-05-09 DIAGNOSIS — H5462 Unqualified visual loss, left eye, normal vision right eye: Secondary | ICD-10-CM | POA: Diagnosis present

## 2019-05-09 DIAGNOSIS — Z79899 Other long term (current) drug therapy: Secondary | ICD-10-CM

## 2019-05-09 DIAGNOSIS — Z7982 Long term (current) use of aspirin: Secondary | ICD-10-CM

## 2019-05-09 DIAGNOSIS — R419 Unspecified symptoms and signs involving cognitive functions and awareness: Secondary | ICD-10-CM | POA: Diagnosis not present

## 2019-05-09 DIAGNOSIS — Z8673 Personal history of transient ischemic attack (TIA), and cerebral infarction without residual deficits: Secondary | ICD-10-CM

## 2019-05-09 DIAGNOSIS — R339 Retention of urine, unspecified: Secondary | ICD-10-CM | POA: Diagnosis present

## 2019-05-09 DIAGNOSIS — I5042 Chronic combined systolic (congestive) and diastolic (congestive) heart failure: Secondary | ICD-10-CM | POA: Diagnosis present

## 2019-05-09 DIAGNOSIS — F1011 Alcohol abuse, in remission: Secondary | ICD-10-CM | POA: Diagnosis present

## 2019-05-09 DIAGNOSIS — R4182 Altered mental status, unspecified: Secondary | ICD-10-CM | POA: Diagnosis not present

## 2019-05-09 LAB — CBC WITH DIFFERENTIAL/PLATELET
Abs Immature Granulocytes: 0.03 10*3/uL (ref 0.00–0.07)
Basophils Absolute: 0 10*3/uL (ref 0.0–0.1)
Basophils Relative: 0 %
Eosinophils Absolute: 0.3 10*3/uL (ref 0.0–0.5)
Eosinophils Relative: 6 %
HCT: 24.1 % — ABNORMAL LOW (ref 39.0–52.0)
Hemoglobin: 7.6 g/dL — ABNORMAL LOW (ref 13.0–17.0)
Immature Granulocytes: 1 %
Lymphocytes Relative: 23 %
Lymphs Abs: 1.1 10*3/uL (ref 0.7–4.0)
MCH: 28.9 pg (ref 26.0–34.0)
MCHC: 31.5 g/dL (ref 30.0–36.0)
MCV: 91.6 fL (ref 80.0–100.0)
Monocytes Absolute: 0.4 10*3/uL (ref 0.1–1.0)
Monocytes Relative: 7 %
Neutro Abs: 2.9 10*3/uL (ref 1.7–7.7)
Neutrophils Relative %: 63 %
Platelets: 141 10*3/uL — ABNORMAL LOW (ref 150–400)
RBC: 2.63 MIL/uL — ABNORMAL LOW (ref 4.22–5.81)
RDW: 14 % (ref 11.5–15.5)
WBC: 4.7 10*3/uL (ref 4.0–10.5)
nRBC: 0 % (ref 0.0–0.2)

## 2019-05-09 LAB — BASIC METABOLIC PANEL
Anion gap: 5 (ref 5–15)
BUN: 54 mg/dL — ABNORMAL HIGH (ref 8–23)
CO2: 19 mmol/L — ABNORMAL LOW (ref 22–32)
Calcium: 7.6 mg/dL — ABNORMAL LOW (ref 8.9–10.3)
Chloride: 111 mmol/L (ref 98–111)
Creatinine, Ser: 3.96 mg/dL — ABNORMAL HIGH (ref 0.61–1.24)
GFR calc Af Amer: 17 mL/min — ABNORMAL LOW (ref >60)
GFR calc non Af Amer: 15 mL/min — ABNORMAL LOW (ref >60)
Glucose, Bld: 165 mg/dL — ABNORMAL HIGH (ref 70–99)
Potassium: 5 mmol/L (ref 3.5–5.1)
Sodium: 135 mmol/L (ref 135–145)

## 2019-05-09 LAB — APTT: aPTT: 39 seconds — ABNORMAL HIGH (ref 24–36)

## 2019-05-09 LAB — ABO/RH: ABO/RH(D): O POS

## 2019-05-09 LAB — PREPARE RBC (CROSSMATCH)

## 2019-05-09 LAB — PROTIME-INR
INR: 1.2 (ref 0.8–1.2)
Prothrombin Time: 15.3 seconds — ABNORMAL HIGH (ref 11.4–15.2)

## 2019-05-09 LAB — GLUCOSE, CAPILLARY
Glucose-Capillary: 146 mg/dL — ABNORMAL HIGH (ref 70–99)
Glucose-Capillary: 90 mg/dL (ref 70–99)

## 2019-05-09 MED ORDER — ACETAMINOPHEN 325 MG PO TABS: 650.0000 mg | ORAL_TABLET | Freq: Four times a day (QID) | ORAL | Status: DC | PRN

## 2019-05-09 MED ORDER — HALOPERIDOL LACTATE 5 MG/ML IJ SOLN
INTRAMUSCULAR | Status: AC
  Filled 2019-05-09: qty 1

## 2019-05-09 MED ORDER — LEVOTHYROXINE SODIUM 112 MCG PO TABS
112.0000 ug | ORAL_TABLET | Freq: Every day | ORAL | Status: DC
  Filled 2019-05-09: qty 1

## 2019-05-09 MED ORDER — INSULIN ASPART 100 UNIT/ML ~~LOC~~ SOLN
0.0000 [IU] | Freq: Three times a day (TID) | SUBCUTANEOUS | Status: DC
  Administered 2019-05-12: 13:00:00 5 [IU] via SUBCUTANEOUS
  Administered 2019-05-13: 18:00:00 2 [IU] via SUBCUTANEOUS
  Filled 2019-05-09 (×2): qty 1

## 2019-05-09 MED ORDER — SODIUM CHLORIDE 0.9 % IV SOLN
10.0000 mL/h | Freq: Once | INTRAVENOUS | Status: AC
  Administered 2019-05-11: 15:00:00 via INTRAVENOUS

## 2019-05-09 MED ORDER — GLIPIZIDE 5 MG PO TABS
2.5000 mg | ORAL_TABLET | Freq: Two times a day (BID) | ORAL | Status: DC
  Administered 2019-05-11 – 2019-05-15 (×7): 2.5 mg via ORAL
  Filled 2019-05-09 (×13): qty 0.5

## 2019-05-09 MED ORDER — SODIUM CHLORIDE 0.9 % IV SOLN
INTRAVENOUS | Status: DC
  Administered 2019-05-10: 03:00:00 via INTRAVENOUS

## 2019-05-09 MED ORDER — DIVALPROEX SODIUM ER 500 MG PO TB24
500.0000 mg | ORAL_TABLET | Freq: Two times a day (BID) | ORAL | Status: DC
  Administered 2019-05-11 – 2019-05-21 (×21): 500 mg via ORAL
  Filled 2019-05-09 (×8): qty 1
  Filled 2019-05-09: qty 2
  Filled 2019-05-09 (×3): qty 1
  Filled 2019-05-09: qty 2
  Filled 2019-05-09: qty 1
  Filled 2019-05-09: qty 2
  Filled 2019-05-09 (×3): qty 1
  Filled 2019-05-09: qty 2
  Filled 2019-05-09 (×2): qty 1
  Filled 2019-05-09: qty 2
  Filled 2019-05-09 (×3): qty 1

## 2019-05-09 MED ORDER — ASPIRIN EC 81 MG PO TBEC
81.0000 mg | DELAYED_RELEASE_TABLET | Freq: Every day | ORAL | Status: DC
  Administered 2019-05-11 – 2019-05-20 (×10): 81 mg via ORAL
  Filled 2019-05-09 (×10): qty 1

## 2019-05-09 MED ORDER — INSULIN ASPART 100 UNIT/ML ~~LOC~~ SOLN
0.0000 [IU] | Freq: Every day | SUBCUTANEOUS | Status: DC
  Administered 2019-05-12: 22:00:00 3 [IU] via SUBCUTANEOUS
  Administered 2019-05-14: 23:00:00 2 [IU] via SUBCUTANEOUS
  Filled 2019-05-09 (×2): qty 1

## 2019-05-09 MED ORDER — TAMSULOSIN HCL 0.4 MG PO CAPS
0.4000 mg | ORAL_CAPSULE | Freq: Every day | ORAL | Status: DC
  Administered 2019-05-11 – 2019-05-21 (×11): 0.4 mg via ORAL
  Filled 2019-05-09 (×11): qty 1

## 2019-05-09 MED ORDER — PANCRELIPASE (LIP-PROT-AMYL) 12000-38000 UNITS PO CPEP
12000.0000 [IU] | ORAL_CAPSULE | Freq: Three times a day (TID) | ORAL | Status: DC
  Administered 2019-05-11 – 2019-05-21 (×23): 12000 [IU] via ORAL
  Filled 2019-05-09 (×36): qty 1

## 2019-05-09 MED ORDER — CITALOPRAM HYDROBROMIDE 20 MG PO TABS
20.0000 mg | ORAL_TABLET | Freq: Every day | ORAL | Status: DC
  Administered 2019-05-11 – 2019-05-21 (×11): 20 mg via ORAL
  Filled 2019-05-09 (×12): qty 1

## 2019-05-09 MED ORDER — ATORVASTATIN CALCIUM 20 MG PO TABS
40.0000 mg | ORAL_TABLET | Freq: Every day | ORAL | Status: DC
  Administered 2019-05-11 – 2019-05-20 (×10): 40 mg via ORAL
  Filled 2019-05-09 (×10): qty 2

## 2019-05-09 MED ORDER — SODIUM BICARBONATE 650 MG PO TABS
650.0000 mg | ORAL_TABLET | Freq: Two times a day (BID) | ORAL | Status: DC
  Administered 2019-05-11 – 2019-05-19 (×17): 650 mg via ORAL
  Filled 2019-05-09 (×17): qty 1

## 2019-05-09 MED ORDER — METOPROLOL TARTRATE 25 MG PO TABS
25.0000 mg | ORAL_TABLET | Freq: Two times a day (BID) | ORAL | Status: DC
  Administered 2019-05-11 – 2019-05-18 (×16): 25 mg via ORAL
  Filled 2019-05-09 (×17): qty 1

## 2019-05-09 MED ORDER — ONDANSETRON HCL 4 MG/2ML IJ SOLN
4.0000 mg | Freq: Four times a day (QID) | INTRAMUSCULAR | Status: DC | PRN
  Administered 2019-05-10: 12:00:00 4 mg via INTRAVENOUS
  Filled 2019-05-09: qty 2

## 2019-05-09 MED ORDER — AMLODIPINE BESYLATE 10 MG PO TABS
10.0000 mg | ORAL_TABLET | Freq: Every day | ORAL | Status: DC
  Administered 2019-05-11 – 2019-05-21 (×11): 10 mg via ORAL
  Filled 2019-05-09 (×2): qty 1
  Filled 2019-05-09: qty 2
  Filled 2019-05-09 (×9): qty 1

## 2019-05-09 MED ORDER — CLOPIDOGREL BISULFATE 75 MG PO TABS
75.0000 mg | ORAL_TABLET | Freq: Every day | ORAL | Status: DC
  Administered 2019-05-11 – 2019-05-21 (×11): 75 mg via ORAL
  Filled 2019-05-09 (×11): qty 1

## 2019-05-09 MED ORDER — ACETAMINOPHEN 650 MG RE SUPP: 650.0000 mg | Freq: Four times a day (QID) | RECTAL | Status: DC | PRN

## 2019-05-09 MED ORDER — SODIUM CHLORIDE 0.9 % IV BOLUS
1000.0000 mL | Freq: Once | INTRAVENOUS | Status: AC
  Administered 2019-05-09: 1000 mL via INTRAVENOUS

## 2019-05-09 MED ORDER — ONDANSETRON HCL 4 MG PO TABS: 4.0000 mg | ORAL_TABLET | Freq: Four times a day (QID) | ORAL | Status: DC | PRN

## 2019-05-09 MED ORDER — SODIUM CHLORIDE 0.9 % IV BOLUS: 1000.0000 mL | Freq: Once | INTRAVENOUS | Status: DC

## 2019-05-09 MED ORDER — POLYETHYLENE GLYCOL 3350 17 G PO PACK: 17.0000 g | PACK | Freq: Every day | ORAL | Status: DC | PRN

## 2019-05-09 MED ORDER — HALOPERIDOL LACTATE 5 MG/ML IJ SOLN
5.0000 mg | Freq: Once | INTRAMUSCULAR | Status: AC
  Administered 2019-05-09: 5 mg via INTRAVENOUS

## 2019-05-09 NOTE — ED Triage Notes (Signed)
Pt arrives via [facility] from [facility] for mechanical fall and right eye injury. Pt A&Ox4 and in NAD. Hematoma noted above right eye. PT has prostetic left eye per EMS. BG 67 before he fell at the facility and was given a medication for it and in route BG was 26. PT does take plavix bp 139/63 o2 99% RA HR 55 Pain 8/10

## 2019-05-09 NOTE — ED Notes (Signed)
ED TO INPATIENT HANDOFF REPORT  ED Nurse Name and Phone #: Sam [phone]  S Name/Age/Gender Cameron Adams 68 y.o. male Room/Bed: ED12HA/ED12HA  Code Status   Code Status: Full Code  Home/SNF/Other Skilled nursing facility Patient oriented to: self and place Is this baseline? Yes   Triage Complete: Triage complete  Chief Complaint fall, right eye injury  Triage Note Pt arrives via [facility] from [facility] nursing home for mechanical fall and right eye injury. Pt A&Ox4 and in NAD. Hematoma noted above right eye. PT has prostetic left eye per EMS. BG 67 before he fell at the facility and was given a medication for it and in route BG was 26. PT does take plavix bp 139/63 o2 99% RA HR 55 Pain 8/10   Allergies Allergies  Allergen Reactions  . Gabapentin     Other reaction(s): Other (See Comments) Trouble sleeping     Level of Care/Admitting Diagnosis ED Disposition    ED Disposition Condition [facility] Area: [place] [[id number]]  Level of Care: Med-Surg [16]  Covid Evaluation: Asymptomatic Screening Protocol (No Symptoms)  Diagnosis: Dehydration [276.51.ICD-9-CM]  Admitting Physician: Mayer Camel [[id number]]  Attending Physician: Mayer Camel [[id number]]  Estimated length of stay: past midnight tomorrow  Certification:: I certify this patient will need inpatient services for at least 2 midnights  PT Class (Do Not Modify): Inpatient [101]  PT Acc Code (Do Not Modify): Private [1]       B Medical/Surgery History Past Medical History:  Diagnosis Date  . Anemia of chronic disease   . Blindness of left eye   . Chronic kidney disease, stage III (moderate) (HCC)   . Diabetes ([place])   . Diarrhea   . Diastolic dysfunction    a. 06/2017 Echo: EF 55-60%, no rwma, Gr2 DD, Asc Ao 3.9cm, nl RV fxn, nl PASP.  Marland Kitchen Hypertension   . Hypothyroidism   . Monoclonal gammopathy of unknown significance (MGUS)    a. prev seen @ [facility].  Lost to  f/u - never had BM bx.  . Orthostatic hypotension   . Syncope and collapse    Past Surgical History:  Procedure Laterality Date  . 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 PERIPHERAL VASCULAR CATHETERIZATION N/A 09/15/2016   Procedure: Lower Extremity Angiography;  Surgeon: Algernon Huxley, MD;  Location: [facility];  Service: Cardiovascular;  Laterality: N/A;  . THYROID SURGERY       A IV Location/Drains/Wounds Patient Lines/Drains/Airways Status   Active Line/Drains/Airways    Name:   Placement date:   Placement time:   Site:   Days:   Peripheral IV 05/09/19 Right Antecubital   05/09/19    1856    Antecubital   less than 1   Post Cath / Sheath 09/15/16   09/15/16    1446    -   966   Urethral Catheter Debi Pinkerton, RN 16 Fr.   04/26/19    1945    -   13   Wound / Incision (Open or Dehisced) 09/13/16 Diabetic ulcer Toe (Comment  which one) Right right 2nd toe   09/13/16    1947    Toe (Comment  which one)   968   Wound / Incision (Open or Dehisced) 05/19/17 Non-pressure wound Back Left   05/19/17    0850  Back   720          Intake/Output Last 24 hours No intake or output data in the 24 hours ending 05/09/19 2236  Labs/Imaging Results for orders placed or performed during the hospital encounter of 05/09/19 (from the past 48 hour(s))  CBC with Differential     Status: Abnormal   Collection Time: 05/09/19  7:07 PM  Result Value Ref Range   WBC 4.7 4.0 - 10.5 K/uL   RBC 2.63 (L) 4.22 - 5.81 MIL/uL   Hemoglobin 7.6 (L) 13.0 - 17.0 g/dL   HCT 24.1 (L) 39.0 - 52.0 %   MCV 91.6 80.0 - 100.0 fL   MCH 28.9 26.0 - 34.0 pg   MCHC 31.5 30.0 - 36.0 g/dL   RDW 14.0 11.5 - 15.5 %   Platelets 141 (L) 150 - 400 K/uL   nRBC 0.0 0.0 - 0.2 %   Neutrophils Relative % 63 %   Neutro Abs 2.9 1.7 - 7.7 K/uL   Lymphocytes Relative 23 %   Lymphs Abs 1.1 0.7 - 4.0 K/uL    Monocytes Relative 7 %   Monocytes Absolute 0.4 0.1 - 1.0 K/uL   Eosinophils Relative 6 %   Eosinophils Absolute 0.3 0.0 - 0.5 K/uL   Basophils Relative 0 %   Basophils Absolute 0.0 0.0 - 0.1 K/uL   Immature Granulocytes 1 %   Abs Immature Granulocytes 0.03 0.00 - 0.07 K/uL    Comment: Performed at [facility], [address] [place], [place] [address]  Basic metabolic panel     Status: Abnormal   Collection Time: 05/09/19  7:07 PM  Result Value Ref Range   Sodium 135 135 - 145 mmol/L   Potassium 5.0 3.5 - 5.1 mmol/L   Chloride 111 98 - 111 mmol/L   CO2 19 (L) 22 - 32 mmol/L   Glucose, Bld 165 (H) 70 - 99 mg/dL   BUN 54 (H) 8 - 23 mg/dL   Creatinine, Ser 3.96 (H) 0.61 - 1.24 mg/dL   Calcium 7.6 (L) 8.9 - 10.3 mg/dL   GFR calc non Af Amer 15 (L) >60 mL/min   GFR calc Af Amer 17 (L) >60 mL/min   Anion gap 5 5 - 15    Comment: Performed at [facility], [address] [place], [place] [address]  Protime-INR     Status: Abnormal   Collection Time: 05/09/19  7:07 PM  Result Value Ref Range   Prothrombin Time 15.3 (H) 11.4 - 15.2 seconds   INR 1.2 0.8 - 1.2    Comment: (NOTE) INR goal varies based on device and disease states. Performed at [facility], [address] [place], [place] [address]   APTT     Status: Abnormal   Collection Time: 05/09/19  7:07 PM  Result Value Ref Range   aPTT 39 (H) 24 - 36 seconds    Comment:        IF BASELINE aPTT IS ELEVATED, SUGGEST PATIENT RISK ASSESSMENT BE USED TO DETERMINE APPROPRIATE ANTICOAGULANT THERAPY. Performed at [facility], [address], [place], [place] [address]   ABO/Rh     Status: None   Collection Time: 05/09/19  7:07 PM  Result Value Ref Range   ABO/RH(D)      O POS Performed at [facility], [place], [place] [address]   Glucose, capillary     Status: Abnormal   Collection Time: 05/09/19  8:14 PM  Result Value Ref Range   Glucose-Capillary  146 (H) 70 - 99 mg/dL  Prepare RBC     Status: None (Preliminary result)   Collection Time: 05/09/19  9:00 PM  Result Value Ref Range   Order Confirmation      ORDER PROCESSED BY BLOOD BANK Performed at [facility], [address] [place], [place] [address]   Type and screen [place]     Status: None   Collection Time: 05/09/19  9:15 PM  Result Value Ref Range   ABO/RH(D) O POS    Antibody Screen NEG    Sample Expiration      05/12/2019,2359 Performed at [facility], [address] [place], [place] [address]   Glucose, capillary     Status: None   Collection Time: 05/09/19 10:16 PM  Result Value Ref Range   Glucose-Capillary 90 70 - 99 mg/dL   Ct Head Wo Contrast  Result Date: 05/09/2019 CLINICAL DATA:  Head trauma.  Mechanical fall with right eye injury. EXAM: CT HEAD WITHOUT CONTRAST CT MAXILLOFACIAL WITHOUT CONTRAST CT CERVICAL SPINE WITHOUT CONTRAST TECHNIQUE: Multidetector CT imaging of the head, cervical spine, and maxillofacial structures were performed using the standard protocol without intravenous contrast. Multiplanar CT image reconstructions of the cervical spine and maxillofacial structures were also generated. COMPARISON:  Head CT 04/24/2019 FINDINGS: CT HEAD FINDINGS Brain: There is no mass, hemorrhage or extra-axial collection. There is generalized atrophy without lobar predilection. Areas of hypoattenuation of the deep gray nuclei and confluent periventricular white matter hypodensity, consistent with chronic small vessel disease. Vascular: No abnormal hyperdensity of the major intracranial arteries or dural venous sinuses. No intracranial atherosclerosis. Skull: Intermediate sized right periorbital hematoma. No skull fracture. CT MAXILLOFACIAL FINDINGS Osseous: --Complex facial fracture types: No LeFort, zygomaticomaxillary complex or nasoorbitoethmoidal fracture. --Simple fracture types: None. --Mandible: No fracture or  dislocation. 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 There is a right periorbital hematoma. The right globe is normal. Status post lens replacement. Sinuses: No fluid levels or advanced mucosal thickening. Soft tissues: Right periorbital hematoma CT CERVICAL SPINE FINDINGS Alignment: No static subluxation. Facets are aligned. Occipital condyles and the lateral masses of C1-C2 are aligned. Skull base and vertebrae: No acute fracture. Soft tissues and spinal canal: No prevertebral fluid or swelling. No visible canal hematoma. Disc levels: C6-7 disc space narrowing and uncovertebral hypertrophy. Upper chest: No pneumothorax, pulmonary nodule or pleural effusion. Other: Normal visualized paraspinal cervical soft tissues. IMPRESSION: 1. No acute intracranial abnormality. 2. No fracture or static subluxation of the cervical spine. 3. Large right periorbital hematoma without orbital injury or skull fracture. Electronically Signed   By: Ulyses Jarred M.D.   On: 05/09/2019 19:48   Ct Cervical Spine Wo Contrast  Result Date: 05/09/2019 CLINICAL DATA:  Head trauma.  Mechanical fall with right eye injury. EXAM: CT HEAD WITHOUT CONTRAST CT MAXILLOFACIAL WITHOUT CONTRAST CT CERVICAL SPINE WITHOUT CONTRAST TECHNIQUE: Multidetector CT imaging of the head, cervical spine, and maxillofacial structures were performed using the standard protocol without intravenous contrast. Multiplanar CT image reconstructions of the cervical spine and maxillofacial structures were also generated. COMPARISON:  Head CT 04/24/2019 FINDINGS: CT HEAD FINDINGS Brain: There is no mass, hemorrhage or extra-axial collection. There is generalized atrophy without lobar predilection. Areas of hypoattenuation of the deep gray nuclei and confluent periventricular white matter hypodensity, consistent with chronic small vessel disease. Vascular: No abnormal hyperdensity of the major intracranial arteries or dural venous sinuses. No intracranial  atherosclerosis. Skull:  Intermediate sized right periorbital hematoma. No skull fracture. CT MAXILLOFACIAL FINDINGS Osseous: --Complex facial fracture types: No LeFort, zygomaticomaxillary complex or nasoorbitoethmoidal fracture. --Simple fracture types: None. --Mandible: No fracture or dislocation. 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 There is a right periorbital hematoma. The right globe is normal. Status post lens replacement. Sinuses: No fluid levels or advanced mucosal thickening. Soft tissues: Right periorbital hematoma CT CERVICAL SPINE FINDINGS Alignment: No static subluxation. Facets are aligned. Occipital condyles and the lateral masses of C1-C2 are aligned. Skull base and vertebrae: No acute fracture. Soft tissues and spinal canal: No prevertebral fluid or swelling. No visible canal hematoma. Disc levels: C6-7 disc space narrowing and uncovertebral hypertrophy. Upper chest: No pneumothorax, pulmonary nodule or pleural effusion. Other: Normal visualized paraspinal cervical soft tissues. IMPRESSION: 1. No acute intracranial abnormality. 2. No fracture or static subluxation of the cervical spine. 3. Large right periorbital hematoma without orbital injury or skull fracture. Electronically Signed   By: Ulyses Jarred M.D.   On: 05/09/2019 19:48   Ct Maxillofacial Wo Contrast  Result Date: 05/09/2019 CLINICAL DATA:  Head trauma.  Mechanical fall with right eye injury. EXAM: CT HEAD WITHOUT CONTRAST CT MAXILLOFACIAL WITHOUT CONTRAST CT CERVICAL SPINE WITHOUT CONTRAST TECHNIQUE: Multidetector CT imaging of the head, cervical spine, and maxillofacial structures were performed using the standard protocol without intravenous contrast. Multiplanar CT image reconstructions of the cervical spine and maxillofacial structures were also generated. COMPARISON:  Head CT 04/24/2019 FINDINGS: CT HEAD FINDINGS Brain: There is no mass, hemorrhage or extra-axial collection. There is generalized atrophy without  lobar predilection. Areas of hypoattenuation of the deep gray nuclei and confluent periventricular white matter hypodensity, consistent with chronic small vessel disease. Vascular: No abnormal hyperdensity of the major intracranial arteries or dural venous sinuses. No intracranial atherosclerosis. Skull: Intermediate sized right periorbital hematoma. No skull fracture. CT MAXILLOFACIAL FINDINGS Osseous: --Complex facial fracture types: No LeFort, zygomaticomaxillary complex or nasoorbitoethmoidal fracture. --Simple fracture types: None. --Mandible: No fracture or dislocation. 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 There is a right periorbital hematoma. The right globe is normal. Status post lens replacement. Sinuses: No fluid levels or advanced mucosal thickening. Soft tissues: Right periorbital hematoma CT CERVICAL SPINE FINDINGS Alignment: No static subluxation. Facets are aligned. Occipital condyles and the lateral masses of C1-C2 are aligned. Skull base and vertebrae: No acute fracture. Soft tissues and spinal canal: No prevertebral fluid or swelling. No visible canal hematoma. Disc levels: C6-7 disc space narrowing and uncovertebral hypertrophy. Upper chest: No pneumothorax, pulmonary nodule or pleural effusion. Other: Normal visualized paraspinal cervical soft tissues. IMPRESSION: 1. No acute intracranial abnormality. 2. No fracture or static subluxation of the cervical spine. 3. Large right periorbital hematoma without orbital injury or skull fracture. Electronically Signed   By: Ulyses Jarred M.D.   On: 05/09/2019 19:48    Pending Labs Unresulted Labs (From admission, onward)    Start     Ordered   05/10/19 1779  Basic metabolic panel  Tomorrow morning,   STAT     05/09/19 2103   05/10/19 0500  CBC  Tomorrow morning,   STAT     05/09/19 2103   05/09/19 2138  SARS CORONAVIRUS 2 Nasal Swab Aptima Multi Swab  (Asymptomatic Patients Labs)  Once,   STAT    Question Answer Comment  Is this  test for diagnosis or screening Screening   Symptomatic for COVID-19 as defined by CDC No   Hospitalized for COVID-19 No   Admitted to ICU  for COVID-19 No   Previously tested for COVID-19 Yes   Resident in a congregate (group) care setting No   Employed in healthcare setting No      05/09/19 2137   05/09/19 2104  TSH  Once,   STAT     05/09/19 2103   05/09/19 2104  Urine culture  Once,   STAT    Question:  Patient immune status  Answer:  Normal   05/09/19 2103   05/09/19 2104  Occult blood card to lab, stool RN will collect  Once,   STAT    Question:  Specimen to be collected by:  Answer:  RN will collect   05/09/19 2103   05/09/19 1907  Urinalysis, Routine w reflex microscopic  Once,   STAT     05/09/19 1907          Vitals/Pain Today's Vitals   05/09/19 1845 05/09/19 1857 05/09/19 2058  BP: (!) 147/60  (!) 126/47  Pulse: (!) 56  60  Resp: 18  18  Temp: 97.8 F (36.6 C)    TempSrc: Oral    SpO2: 100%  100%  Weight:  68 kg   Height:  5\' 11"  (1.803 m)     Isolation Precautions No active isolations  Medications Medications  0.9 %  sodium chloride infusion (has no administration in time range)  amLODipine (NORVASC) tablet 10 mg (has no administration in time range)  clopidogrel (PLAVIX) tablet 75 mg (has no administration in time range)  divalproex (DEPAKOTE ER) 24 hr tablet 500 mg (has no administration in time range)  levothyroxine (SYNTHROID) tablet 112 mcg (has no administration in time range)  metoprolol tartrate (LOPRESSOR) tablet 25 mg (has no administration in time range)  tamsulosin (FLOMAX) capsule 0.4 mg (has no administration in time range)  aspirin EC tablet 81 mg (has no administration in time range)  atorvastatin (LIPITOR) tablet 40 mg (has no administration in time range)  citalopram (CELEXA) tablet 20 mg (has no administration in time range)  glipiZIDE (GLUCOTROL) tablet 2.5 mg (has no administration in time range)  lipase/protease/amylase (CREON)  capsule 12,000 Units (has no administration in time range)  sodium bicarbonate tablet 650 mg (has no administration in time range)  0.9 %  sodium chloride infusion (has no administration in time range)  acetaminophen (TYLENOL) tablet 650 mg (has no administration in time range)    Or  acetaminophen (TYLENOL) suppository 650 mg (has no administration in time range)  polyethylene glycol (MIRALAX / GLYCOLAX) packet 17 g (has no administration in time range)  ondansetron (ZOFRAN) tablet 4 mg (has no administration in time range)    Or  ondansetron (ZOFRAN) injection 4 mg (has no administration in time range)  insulin aspart (novoLOG) injection 0-5 Units (has no administration in time range)  insulin aspart (novoLOG) injection 0-15 Units (has no administration in time range)  haloperidol lactate (HALDOL) 5 MG/ML injection (has no administration in time range)  sodium chloride 0.9 % bolus 1,000 mL (1,000 mLs Intravenous New Bag/Given 05/09/19 2019)  haloperidol lactate (HALDOL) injection 5 mg (5 mg Intravenous Given 05/09/19 2227)    Mobility non-ambulatory Low fall risk   Focused Assessments    R Recommendations: See Admitting Provider Note  Report given to:   Additional Notes:  Pt is blind in left eye and currently has hematoma noted to right eye causing vision loss

## 2019-05-09 NOTE — H&P (Signed)
[facility] [place] NAME: Cameron Adams    MR#:  [id number]  DATE OF BIRTH:  04/24/1951  DATE OF ADMISSION:  05/09/2019  PRIMARY CARE PHYSICIAN: [name], MD   REQUESTING/REFERRING PHYSICIAN: Marjean Donna, MD  CHIEF COMPLAINT:   Chief Complaint  Patient presents with   Fall    HISTORY OF PRESENT ILLNESS:  Cameron Adams  is a 68 y.o. male with a known history of diabetes mellitus, CKD, diastolic CHF, history of seizure, hypothyroidism, and history of urinary retention.  Patient presented to the emergency room after sustaining a fall while attempting to get into his wheelchair.  He recalls hitting his right eye on a piece of metal however is unsure what this was.  There is edema to his right eye with tenderness.  No lacerations or wounds noted.  He denies loss of consciousness he denies dizziness or headache.  He denies blurred vision however vision is diminished with edema.  Patient has a left eye prosthesis.  He is on anticoagulant with aspirin and Plavix.  Patient is known to have a decreased hemoglobin from 9 to 7.6 on arrival when compared to a 2-week period.  He denies hematemesis, hematochezia, or melena.  He denies abdominal pain.  He denies diarrhea, nausea, vomiting.  He denies recent fevers or chills.  He denies chest pain or shortness of breath.  Patient has a history of chronic renal failure with baseline labs including BUN of 30 and creatinine 2.27 on 04/30/2019.  Labs completed in the emergency room demonstrate BUN of 54 and creatinine 3.96 with hemoglobin 7.6 and hematocrit 24.1 and platelet count 141.  CT face is negative for acute findings or fractures  He has been admitted to the hospitalist service for acute blood loss anemia with acute on chronic renal failure and generalized weakness.  PAST MEDICAL HISTORY:   Past Medical History:  Diagnosis Date   Anemia of chronic disease    Blindness of left eye     Chronic kidney disease, stage III (moderate) ([place])    Diabetes ([place])    Diarrhea    Diastolic dysfunction    a. 06/2017 Echo: EF 55-60%, no rwma, Gr2 DD, Asc Ao 3.9cm, nl RV fxn, nl PASP.   Hypertension    Hypothyroidism    Monoclonal gammopathy of unknown significance (MGUS)    a. prev seen @ [facility].  Lost to f/u - never had BM bx.   Orthostatic hypotension    Syncope and collapse     PAST SURGICAL HISTORY:   Past Surgical History:  Procedure Laterality Date   AMPUTATION TOE Right 09/19/2016   Procedure: AMPUTATION TOE;  Surgeon: Albertine Patricia, DPM;  Location: [facility];  Service: Podiatry;  Laterality: Right;   PERIPHERAL VASCULAR CATHETERIZATION N/A 09/15/2016   Procedure: Lower Extremity Angiography;  Surgeon: Algernon Huxley, MD;  Location: [facility];  Service: Cardiovascular;  Laterality: N/A;   THYROID SURGERY      SOCIAL HISTORY:   Social History   Tobacco Use   Smoking status: Former Smoker    Packs/day: 0.10    Years: 25.00    Pack years: 2.50    Types: Cigarettes    Quit date: 10/31/1978    Years since quitting: 40.5   Smokeless tobacco: Never Used   Tobacco comment: smoked 2-3 cigarettes/day  Substance Use Topics   Alcohol use: Not Currently    Comment: previously drank heavily but not in many years  FAMILY HISTORY:   Family History  Problem Relation Age of Onset   Kidney failure Mother    Heart disease Father    Kidney failure Father    Heart disease Brother     DRUG ALLERGIES:   Allergies  Allergen Reactions   Gabapentin     Other reaction(s): Other (See Comments) Trouble sleeping     REVIEW OF SYSTEMS:   Review of Systems  Constitutional: Negative for chills, fever and malaise/fatigue.  HENT: Negative for congestion, sinus pain and sore throat.   Eyes: Negative for blurred vision, double vision and pain.  Respiratory: Negative for cough, sputum production, shortness of breath and wheezing.   Cardiovascular:  Negative for chest pain and palpitations.  Gastrointestinal: Negative for abdominal pain, constipation, diarrhea, heartburn, nausea and vomiting.  Genitourinary: Negative for dysuria, flank pain, hematuria and urgency.  Musculoskeletal: Positive for falls (fell out of his wheelchair ). Negative for myalgias.  Skin: Negative for itching and rash.  Neurological: Positive for weakness (general). Negative for dizziness and headaches.  Psychiatric/Behavioral: Negative.  Negative for depression.   MEDICATIONS AT HOME:   Prior to Admission medications   Medication Sig Start Date End Date Taking? Authorizing Provider  acetaminophen (TYLENOL) 500 MG tablet Take 500-1,000 mg by mouth every 6 (six) hours as needed for mild pain, moderate pain or fever.    [provider]  acidophilus (RISAQUAD) CAPS capsule Take 1 capsule by mouth daily.    [provider]  amLODipine (NORVASC) 10 MG tablet Take 1 tablet (10 mg total) by mouth daily. 04/30/19   Henreitta Leber, MD  aspirin EC 81 MG tablet Take 81 mg by mouth daily.    [provider]  atorvastatin (LIPITOR) 40 MG tablet Take 40 mg by mouth at bedtime.    [provider]  Cholecalciferol (VITAMIN D-1000 MAX ST) 25 MCG (1000 UT) tablet Take 2,000 Units by mouth daily.     [provider]  citalopram (CELEXA) 20 MG tablet Take 20 mg by mouth daily.    [provider]  clopidogrel (PLAVIX) 75 MG tablet Take 1 tablet (75 mg total) by mouth daily. 12/07/18 12/07/19  Salary, Avel Peace, MD  divalproex (DEPAKOTE ER) 500 MG 24 hr tablet Take 1 tablet (500 mg total) by mouth 2 (two) times daily. 01/16/19   Max Sane, MD  glipiZIDE (GLUCOTROL) 5 MG tablet Take 2.5 mg by mouth 2 (two) times daily.  11/26/18   [provider]  hydrOXYzine (ATARAX/VISTARIL) 10 MG tablet Take 1 tablet (10 mg total) by mouth every 8 (eight) hours as needed for anxiety. 12/07/18   Salary, Avel Peace, MD  levothyroxine (SYNTHROID,  LEVOTHROID) 100 MCG tablet Take 1 tablet (100 mcg total) by mouth daily before breakfast. Patient taking differently: Take 112 mcg by mouth daily before breakfast.  12/08/18   Salary, Avel Peace, MD  loperamide (IMODIUM) 2 MG capsule Take 1 capsule (2 mg total) by mouth every 6 (six) hours as needed for diarrhea or loose stools. 06/29/17   Vaughan Basta, MD  metoprolol tartrate (LOPRESSOR) 25 MG tablet Take 1 tablet (25 mg total) by mouth 2 (two) times daily. 04/30/19   Henreitta Leber, MD  Pancrelipase, Lip-Prot-Amyl, (CREON) 24000-76000 units CPEP Take 48,000 Units by mouth 3 (three) times daily with meals.    [provider]  sodium bicarbonate 650 MG tablet Take 650 mg by mouth 2 (two) times daily.    [provider]  tamsulosin (FLOMAX) 0.4 MG  CAPS capsule Take 1 capsule (0.4 mg total) by mouth daily. 04/30/19   Henreitta Leber, MD      VITAL SIGNS:  Blood pressure (!) 126/47, pulse 60, temperature 97.8 F (36.6 C), temperature source Oral, resp. rate 18, height 5\' 11"  (1.803 m), weight 68 kg, SpO2 100 %.  PHYSICAL EXAMINATION:  Physical Exam  GENERAL:  68 y.o.-year-old patient lying in the bed with no acute distress.  EYES: Pupils equal, round, reactive to light and accommodation. No scleral icterus. Extraocular muscles intact.  Right eye edema and tenderness, left eye prosthesis HEENT: Head atraumatic, normocephalic. Oropharynx and nasopharynx clear.  NECK:  Supple, no jugular venous distention. No thyroid enlargement, no tenderness.  LUNGS: Normal breath sounds bilaterally, no wheezing, rales,rhonchi or crepitation. No use of accessory muscles of respiration.  CARDIOVASCULAR: Regular rate and rhythm, S1, S2 normal. No murmurs, rubs, or gallops.  ABDOMEN: Soft, nondistended, nontender. Bowel sounds present. No organomegaly or mass.  EXTREMITIES: No pedal edema, cyanosis, or clubbing.  Bursa of left elbow NEUROLOGIC: Cranial nerves II through XII are intact.   Generalized weakness. Sensation intact. Gait not checked.  PSYCHIATRIC: The patient is alert and oriented x 3.   SKIN: No obvious rash, lesion, or ulcer.  Right eye edema  LABORATORY PANEL:   CBC Recent Labs  Lab 05/09/19 1907  WBC 4.7  HGB 7.6*  HCT 24.1*  PLT 141*   ------------------------------------------------------------------------------------------------------------------  Chemistries  Recent Labs  Lab 05/09/19 1907  NA 135  K 5.0  CL 111  CO2 19*  GLUCOSE 165*  BUN 54*  CREATININE 3.96*  CALCIUM 7.6*   ------------------------------------------------------------------------------------------------------------------  Cardiac Enzymes No results for input(s): TROPONINI in the last 168 hours. ------------------------------------------------------------------------------------------------------------------  RADIOLOGY:  Ct Head Wo Contrast  Result Date: 05/09/2019 CLINICAL DATA:  Head trauma.  Mechanical fall with right eye injury. EXAM: CT HEAD WITHOUT CONTRAST CT MAXILLOFACIAL WITHOUT CONTRAST CT CERVICAL SPINE WITHOUT CONTRAST TECHNIQUE: Multidetector CT imaging of the head, cervical spine, and maxillofacial structures were performed using the standard protocol without intravenous contrast. Multiplanar CT image reconstructions of the cervical spine and maxillofacial structures were also generated. COMPARISON:  Head CT 04/24/2019 FINDINGS: CT HEAD FINDINGS Brain: There is no mass, hemorrhage or extra-axial collection. There is generalized atrophy without lobar predilection. Areas of hypoattenuation of the deep gray nuclei and confluent periventricular white matter hypodensity, consistent with chronic small vessel disease. Vascular: No abnormal hyperdensity of the major intracranial arteries or dural venous sinuses. No intracranial atherosclerosis. Skull: Intermediate sized right periorbital hematoma. No skull fracture. CT MAXILLOFACIAL FINDINGS Osseous: --Complex facial  fracture types: No LeFort, zygomaticomaxillary complex or nasoorbitoethmoidal fracture. --Simple fracture types: None. --Mandible: No fracture or dislocation. 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 There is a right periorbital hematoma. The right globe is normal. Status post lens replacement. Sinuses: No fluid levels or advanced mucosal thickening. Soft tissues: Right periorbital hematoma CT CERVICAL SPINE FINDINGS Alignment: No static subluxation. Facets are aligned. Occipital condyles and the lateral masses of C1-C2 are aligned. Skull base and vertebrae: No acute fracture. Soft tissues and spinal canal: No prevertebral fluid or swelling. No visible canal hematoma. Disc levels: C6-7 disc space narrowing and uncovertebral hypertrophy. Upper chest: No pneumothorax, pulmonary nodule or pleural effusion. Other: Normal visualized paraspinal cervical soft tissues. IMPRESSION: 1. No acute intracranial abnormality. 2. No fracture or static subluxation of the cervical spine. 3. Large right periorbital hematoma without orbital injury or skull fracture. Electronically Signed   By: Cletus Gash.D.  On: 05/09/2019 19:48   Ct Cervical Spine Wo Contrast  Result Date: 05/09/2019 CLINICAL DATA:  Head trauma.  Mechanical fall with right eye injury. EXAM: CT HEAD WITHOUT CONTRAST CT MAXILLOFACIAL WITHOUT CONTRAST CT CERVICAL SPINE WITHOUT CONTRAST TECHNIQUE: Multidetector CT imaging of the head, cervical spine, and maxillofacial structures were performed using the standard protocol without intravenous contrast. Multiplanar CT image reconstructions of the cervical spine and maxillofacial structures were also generated. COMPARISON:  Head CT 04/24/2019 FINDINGS: CT HEAD FINDINGS Brain: There is no mass, hemorrhage or extra-axial collection. There is generalized atrophy without lobar predilection. Areas of hypoattenuation of the deep gray nuclei and confluent periventricular white matter hypodensity, consistent with  chronic small vessel disease. Vascular: No abnormal hyperdensity of the major intracranial arteries or dural venous sinuses. No intracranial atherosclerosis. Skull: Intermediate sized right periorbital hematoma. No skull fracture. CT MAXILLOFACIAL FINDINGS Osseous: --Complex facial fracture types: No LeFort, zygomaticomaxillary complex or nasoorbitoethmoidal fracture. --Simple fracture types: None. --Mandible: No fracture or dislocation. 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 There is a right periorbital hematoma. The right globe is normal. Status post lens replacement. Sinuses: No fluid levels or advanced mucosal thickening. Soft tissues: Right periorbital hematoma CT CERVICAL SPINE FINDINGS Alignment: No static subluxation. Facets are aligned. Occipital condyles and the lateral masses of C1-C2 are aligned. Skull base and vertebrae: No acute fracture. Soft tissues and spinal canal: No prevertebral fluid or swelling. No visible canal hematoma. Disc levels: C6-7 disc space narrowing and uncovertebral hypertrophy. Upper chest: No pneumothorax, pulmonary nodule or pleural effusion. Other: Normal visualized paraspinal cervical soft tissues. IMPRESSION: 1. No acute intracranial abnormality. 2. No fracture or static subluxation of the cervical spine. 3. Large right periorbital hematoma without orbital injury or skull fracture. Electronically Signed   By: Ulyses Jarred M.D.   On: 05/09/2019 19:48   Ct Maxillofacial Wo Contrast  Result Date: 05/09/2019 CLINICAL DATA:  Head trauma.  Mechanical fall with right eye injury. EXAM: CT HEAD WITHOUT CONTRAST CT MAXILLOFACIAL WITHOUT CONTRAST CT CERVICAL SPINE WITHOUT CONTRAST TECHNIQUE: Multidetector CT imaging of the head, cervical spine, and maxillofacial structures were performed using the standard protocol without intravenous contrast. Multiplanar CT image reconstructions of the cervical spine and maxillofacial structures were also generated. COMPARISON:  Head CT  04/24/2019 FINDINGS: CT HEAD FINDINGS Brain: There is no mass, hemorrhage or extra-axial collection. There is generalized atrophy without lobar predilection. Areas of hypoattenuation of the deep gray nuclei and confluent periventricular white matter hypodensity, consistent with chronic small vessel disease. Vascular: No abnormal hyperdensity of the major intracranial arteries or dural venous sinuses. No intracranial atherosclerosis. Skull: Intermediate sized right periorbital hematoma. No skull fracture. CT MAXILLOFACIAL FINDINGS Osseous: --Complex facial fracture types: No LeFort, zygomaticomaxillary complex or nasoorbitoethmoidal fracture. --Simple fracture types: None. --Mandible: No fracture or dislocation. 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 There is a right periorbital hematoma. The right globe is normal. Status post lens replacement. Sinuses: No fluid levels or advanced mucosal thickening. Soft tissues: Right periorbital hematoma CT CERVICAL SPINE FINDINGS Alignment: No static subluxation. Facets are aligned. Occipital condyles and the lateral masses of C1-C2 are aligned. Skull base and vertebrae: No acute fracture. Soft tissues and spinal canal: No prevertebral fluid or swelling. No visible canal hematoma. Disc levels: C6-7 disc space narrowing and uncovertebral hypertrophy. Upper chest: No pneumothorax, pulmonary nodule or pleural effusion. Other: Normal visualized paraspinal cervical soft tissues. IMPRESSION: 1. No acute intracranial abnormality. 2. No fracture or static subluxation of the cervical spine. 3. Large right  periorbital hematoma without orbital injury or skull fracture. Electronically Signed   By: Ulyses Jarred M.D.   On: 05/09/2019 19:48      IMPRESSION AND PLAN:   1.  Acute blood loss anemia - We will get stool for occult blood.  Negative guaiac in the emergency room. -Patient has received 1 unit packed red blood cells in the ER on arrival.  We will continue every 6  hour hemoglobin and hematocrit  2.  Acute on chronic renal failure - Patient received 1 L IV normal saline in the emergency room currently with normal saline infusing to peripheral IV at 100 cc/h.  Will repeat BMP and CBC in the a.m. and continue to monitor renal function closely  3.  Mechanical fall - Physical therapy consulted for supportive care  4.  Diabetes mellitus - Moderate sliding scale insulin -Hemoglobin A1c  DVT prophylaxis with SCDs and PPI prophylaxis    All the records are reviewed and case discussed with ED provider. The plan of care was discussed in details with the patient (and family). I answered all questions. The patient agreed to proceed with the above mentioned plan. Further management will depend upon hospital course.   CODE STATUS: Full code  TOTAL TIME TAKING CARE OF THIS PATIENT: 45 minutes.    Cameron Adams CRNPon 05/09/2019 at 9:03 PM  Pager - [phone]  After 6pm go to [email or web address] - Proofreader  [facility]  Office  [phone]  CC: Primary care physician; [name], MD   Note: This dictation was prepared with Dragon dictation along with smaller phrase technology. Any transcriptional errors that result from this process are unintentional.

## 2019-05-09 NOTE — ED Notes (Signed)
Pt cleaned up at this time. Pt repositioned in bed at this time

## 2019-05-09 NOTE — ED Notes (Signed)
Spoke with pt's daughter Maryanna Shape and updated her on pt's status.

## 2019-05-09 NOTE — Progress Notes (Deleted)
05/10/2019 12:02 PM   Cameron Adams 03-05-1951 [id number]  Referring provider: [name], MD [place] [facility] [place],  [place] [address]  No chief complaint on file.   HPI: Cameron Adams is a 68 year old male who presents today for follow up after an episode of urinary retention.  Background history Cameron Adams is a 68 year old male who was admitted for altered mental status and developed urinary retention while hospitalized with CMA, Albert from [place].  Foley catheter was placed on 04/25/2019 with a PVR of 600 cc.  He was started on Flomax at that time.  Labs at discharge: serum creatinine 2.27, UA 11-20 RBC's and 21-50 WBC's.  1st urine culture was negative.  2nd urine culture grew out yeast - likely a contaminate.  Meds given in the hospital:  Flomax   Prior urological history:  RUS on 12/04/2018 noted a left renal cyst and bladder wall thickening.  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    At last visit, Foley catheter was removed for TOV.         PMH: Past Medical History:  Diagnosis Date  . Anemia of chronic disease   . Blindness of left eye   . Chronic kidney disease, stage III (moderate) (HCC)   . Diabetes ([place])   . Diarrhea   . Diastolic dysfunction    a. 06/2017 Echo: EF 55-60%, no rwma, Gr2 DD, Asc Ao 3.9cm, nl RV fxn, nl PASP.  Marland Kitchen Hypertension   . Hypothyroidism   . Monoclonal gammopathy of unknown significance (MGUS)    a. prev seen @ [facility].  Lost to f/u - never had BM bx.  . Orthostatic hypotension   . Syncope and collapse     Surgical History: Past Surgical History:  Procedure Laterality Date  . 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 PERIPHERAL VASCULAR CATHETERIZATION N/A 09/15/2016   Procedure: Lower  Extremity Angiography;  Surgeon: Algernon Huxley, MD;  Location: [place] CV LAB;  Service: Cardiovascular;  Laterality: N/A;  . THYROID SURGERY      Home Medications:  Allergies as of 05/10/2019      Reactions   Gabapentin    Other reaction(s): Other (See Comments) Trouble sleeping       Medication List       Accurate as of May 09, 2019 12:02 PM. If you have any questions, ask your nurse or doctor.        acetaminophen 500 MG tablet Commonly known as: TYLENOL Take 500-1,000 mg by mouth every 6 (six) hours as needed for mild pain, moderate pain or fever.   acidophilus Caps capsule Take 1 capsule by mouth daily.   amLODipine 10 MG tablet Commonly known as: NORVASC Take 1 tablet (10 mg total) by mouth daily.   aspirin EC 81 MG tablet Take 81 mg by mouth daily.   atorvastatin 40 MG tablet Commonly known as: LIPITOR Take 40 mg by mouth at bedtime.   citalopram 20 MG tablet Commonly known as: CELEXA Take 20 mg by mouth daily.   clopidogrel 75 MG tablet Commonly known as: Plavix Take 1 tablet (75 mg total) by mouth daily.   Creon 24000-76000 units Cpep Generic drug: Pancrelipase (Lip-Prot-Amyl) Take 48,000 Units by mouth 3 (three) times daily  with meals.   divalproex 500 MG 24 hr tablet Commonly known as: DEPAKOTE ER Take 1 tablet (500 mg total) by mouth 2 (two) times daily.   glipiZIDE 5 MG tablet Commonly known as: GLUCOTROL Take 2.5 mg by mouth 2 (two) times daily.   hydrOXYzine 10 MG tablet Commonly known as: ATARAX/VISTARIL Take 1 tablet (10 mg total) by mouth every 8 (eight) hours as needed for anxiety.   levothyroxine 100 MCG tablet Commonly known as: SYNTHROID Take 1 tablet (100 mcg total) by mouth daily before breakfast. What changed: how much to take   loperamide 2 MG capsule Commonly known as: IMODIUM Take 1 capsule (2 mg total) by mouth every 6 (six) hours as needed for diarrhea or loose stools.   metoprolol tartrate 25 MG tablet Commonly  known as: LOPRESSOR Take 1 tablet (25 mg total) by mouth 2 (two) times daily.   sodium bicarbonate 650 MG tablet Take 650 mg by mouth 2 (two) times daily.   tamsulosin 0.4 MG Caps capsule Commonly known as: FLOMAX Take 1 capsule (0.4 mg total) by mouth daily.   Vitamin D-1000 Max St 25 MCG (1000 UT) tablet Generic drug: Cholecalciferol Take 2,000 Units by mouth daily.       Allergies:  Allergies  Allergen Reactions  . Gabapentin     Other reaction(s): Other (See Comments) Trouble sleeping     Family History: Family History  Problem Relation Age of Onset  . Kidney failure Mother   . Heart disease Father   . Kidney failure Father   . Heart disease Brother     Social History:  reports that he quit smoking about 40 years ago. His smoking use included cigarettes. He has a 2.50 pack-year smoking history. He has never used smokeless tobacco. He reports previous alcohol use. He reports that he does not use drugs.  ROS:                                        Physical Exam: There were no vitals taken for this visit.  Constitutional:  Well nourished. Alert and oriented, No acute distress. HEENT: [place] AT, moist mucus membranes.  Trachea midline, no masses. Cardiovascular: No clubbing, cyanosis, or edema. Respiratory: Normal respiratory effort, no increased work of breathing. GI: Abdomen is soft, non tender, non distended, no abdominal masses. Liver and spleen not palpable.  No hernias appreciated.  Stool sample for occult testing is not indicated.   GU: No CVA tenderness.  No bladder fullness or masses.  Patient with circumcised/uncircumcised phallus. ***Foreskin easily retracted***  Urethral meatus is patent.  No penile discharge. No penile lesions or rashes. Scrotum without lesions, cysts, rashes and/or edema.  Testicles are located scrotally bilaterally. No masses are appreciated in the testicles. Left and right epididymis are normal. Rectal: Patient with   normal sphincter tone. Anus and perineum without scarring or rashes. No rectal masses are appreciated. Prostate is approximately *** grams, *** nodules are appreciated. Seminal vesicles are normal. Skin: No rashes, bruises or suspicious lesions. Lymph: No cervical or inguinal adenopathy. Neurologic: Grossly intact, no focal deficits, moving all 4 extremities. Psychiatric: Normal mood and affect.  Laboratory Data: Lab Results  Component Value Date   WBC 3.7 (L) 04/30/2019   HGB 8.5 (L) 04/30/2019   HCT 26.3 (L) 04/30/2019   MCV 90.4 04/30/2019   PLT 91 (L) 04/30/2019    Lab Results  Component Value Date   CREATININE 2.27 (H) 04/30/2019    No results found for: PSA  No results found for: TESTOSTERONE  Lab Results  Component Value Date   HGBA1C 7.2 (H) 12/05/2018    Lab Results  Component Value Date   TSH 5.188 (H) 04/24/2019       Component Value Date/Time   CHOL 102 12/05/2018 0453   HDL 48 12/05/2018 0453   CHOLHDL 2.1 12/05/2018 0453   VLDL 8 12/05/2018 0453   LDLCALC 46 12/05/2018 0453    Lab Results  Component Value Date   AST 27 04/24/2019   Lab Results  Component Value Date   ALT 14 04/24/2019   No components found for: ALKALINEPHOPHATASE No components found for: BILIRUBINTOTAL  No results found for: ESTRADIOL  Urinalysis    Component Value Date/Time   COLORURINE YELLOW 05/04/2019 1410   APPEARANCEUR CLEAR 05/04/2019 1410   LABSPEC 1.010 05/04/2019 1410   PHURINE 5.5 05/04/2019 1410   GLUCOSEU 100 (A) 05/04/2019 1410   HGBUR MODERATE (A) 05/04/2019 1410   BILIRUBINUR NEGATIVE 05/04/2019 1410   KETONESUR NEGATIVE 05/04/2019 1410   PROTEINUR 100 (A) 05/04/2019 1410   NITRITE NEGATIVE 05/04/2019 1410   LEUKOCYTESUR MODERATE (A) 05/04/2019 1410    I have reviewed the labs.   Pertinent Imaging: CLINICAL DATA:  Acute renal injury  EXAM: RENAL / URINARY TRACT ULTRASOUND COMPLETE  COMPARISON:  06/29/2017  FINDINGS: Right Kidney:   Renal measurements: 9.6 x 5.8 x 5.0 cm = volume: 147 mL . Echogenicity within normal limits. No mass or hydronephrosis visualized.  Left Kidney:  Renal measurements: 9.4 x 5.4 x 5.4 cm. = volume: 145 mL. No hydronephrosis is noted. There is a 1.6 cm cyst in the lower pole of the left kidney. This is stable from the prior exam.  Bladder:  Mild thickening of the bladder wall is noted although this is felt to be related to incomplete distension.  IMPRESSION: Stable left renal cyst.  No acute abnormality noted.   Electronically Signed   By: Inez Catalina M.D.   On: 12/04/2018 07:44  Assessment & Plan:    1. History of retention ***  2. Microscopic hematuria - will need to reassess at follow up visit - if persists will need to pursue hematuria work up - will recheck UA in one week   3. Bladder wall thickening - see above  4. BPH with LUTS Continue conservative management, avoiding bladder irritants and timed voiding's Most bothersome symptoms is/are retention Continue tamsulosin 0.4 mg daily:refills RTC in one week months for I PSS and exam    No follow-ups on file.  These notes generated with voice recognition software. I apologize for typographical errors.  Zara Council, PA-C  [facility] [address]  [place], [place] [address] [phone]

## 2019-05-09 NOTE — ED Provider Notes (Addendum)
[facility] Emergency Department Provider Note  ____________________________________________   First MD Initiated Contact with Patient 05/09/19 1856     (approximate)  I have reviewed the triage vital signs and the nursing notes.   HISTORY  Chief Complaint Fall    HPI Cameron Adams is a 68 y.o. male with diabetes, hypothroidism, history of seizures, CKD, urinary retention,  had a fall while trying to get into his wheelchair. He has swelling to the Right eye.  He is unsure what he hit it on. He is on blood thinner.  He denies LOC.  He did have another fall 2 weeks ago.  Patient is having pain on his right eye that is moderate, constant, nothing makes it better or worse.  He has significant swelling to his right eye.  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  He denies that his vision is any worse than prior since the fall.  Baseline labs BUN 30. Cr 2.27 on July 25th.           Past Medical History:  Diagnosis Date   Anemia of chronic disease    Blindness of left eye    Chronic kidney disease, stage III (moderate) (HCC)    Diabetes ([place])    Diarrhea    Diastolic dysfunction    a. 06/2017 Echo: EF 55-60%, no rwma, Gr2 DD, Asc Ao 3.9cm, nl RV fxn, nl PASP.   Hypertension    Hypothyroidism    Monoclonal gammopathy of unknown significance (MGUS)    a. prev seen @ [facility].  Lost to f/u - never had BM bx.   Orthostatic hypotension    Syncope and collapse     Patient Active Problem List   Diagnosis Date Noted   Confusion    Visual hallucination    Altered mental status 04/24/2019   Syncope and collapse 01/12/2019   Pancreatic insufficiency 33/29/5188   Chronic systolic CHF (congestive heart failure) ([place]) 12/13/2018   History of TIA (transient ischemic attack) 12/13/2018   Weakness    AKI (acute kidney injury) ([place]) 12/04/2018   Arm wound, right, initial  encounter 07/21/2018   Wound infection 07/21/2018   Orthostatic hypotension dysautonomic syndrome ([place]) 07/07/2018   Acute renal failure superimposed on stage 3 chronic kidney disease ([place]) 12/09/2017   Dizziness 06/22/2017   Hyperkalemia 06/16/2017   Esophageal dysmotilities    Dysphagia    Rib fractures 05/17/2017   Hemothorax, right 05/17/2017   Recurrent major depressive disorder, in partial remission ([place]) 05/13/2017   Pure hypercholesterolemia 05/13/2017   IDDM (insulin dependent diabetes mellitus) ([place]) 05/13/2017   Hypothyroidism (acquired) 05/13/2017   GAD (generalized anxiety disorder) 05/13/2017   CKD (chronic kidney disease) stage 3, GFR 30-59 ml/min ([place]) 05/13/2017   Toe gangrene ([place]) 09/16/2016   PVD (peripheral vascular disease) ([place]) 09/16/2016   Uncontrolled diabetes mellitus ([place]) 09/16/2016   Hyponatremia 09/16/2016   Hypokalemia 09/16/2016   Essential hypertension, malignant 09/16/2016   Abnormal angiogram 09/16/2016   Gangrene ([place]) 09/13/2016   Pruritus 03/26/2016   Orthostatic hypotension 03/26/2016   MGUS (monoclonal gammopathy of unknown significance) 03/26/2016   Alcohol abuse 03/26/2016   Hyperglycemia 03/23/2016   Passive suicidal ideations 09/16/2014   MDD (major depressive disorder) 09/16/2014   Dyspnea 11/09/2013   CAP (community acquired pneumonia) 10/31/2013   Chest pain 10/31/2013   Chronic respiratory failure with hypoxia ([place]) 10/31/2013   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 Authorizing Provider  acetaminophen (TYLENOL) 500 MG tablet Take 500-1,000 mg by mouth every 6 (six) hours as needed for mild pain, moderate pain or fever.    [provider]  acidophilus (RISAQUAD) CAPS capsule Take 1 capsule by mouth daily.    [provider]  amLODipine (NORVASC) 10 MG tablet Take 1 tablet (10 mg total) by mouth daily. 04/30/19   Henreitta Leber, MD  aspirin EC 81 MG tablet Take 81 mg by mouth daily.    [provider]  atorvastatin (LIPITOR) 40 MG tablet Take 40 mg by mouth at bedtime.    [provider]  Cholecalciferol (VITAMIN D-1000 MAX ST) 25 MCG (1000 UT) tablet Take 2,000 Units by mouth daily.     [provider]  citalopram (CELEXA) 20 MG tablet Take 20 mg by mouth daily.    [provider]  clopidogrel (PLAVIX) 75 MG tablet Take 1 tablet (75 mg total) by mouth daily. 12/07/18 12/07/19  Salary, Avel Peace, MD  divalproex (DEPAKOTE ER) 500 MG 24 hr tablet Take 1 tablet (500 mg total) by mouth 2 (two) times daily. 01/16/19   Max Sane, MD  glipiZIDE (GLUCOTROL) 5 MG tablet Take 2.5 mg by mouth 2 (two) times daily.  11/26/18   [provider]  hydrOXYzine (ATARAX/VISTARIL) 10 MG tablet Take 1 tablet (10 mg total) by mouth every 8 (eight) hours as needed for anxiety. 12/07/18   Salary, Avel Peace, MD  levothyroxine (SYNTHROID, LEVOTHROID) 100 MCG tablet Take 1 tablet (100 mcg total) by mouth daily before breakfast. Patient taking differently: Take 112 mcg by mouth daily before breakfast.  12/08/18   Salary, Avel Peace, MD  loperamide (IMODIUM) 2 MG capsule Take 1 capsule (2 mg total) by mouth every 6 (six) hours as needed for diarrhea or loose stools. 06/29/17   Vaughan Basta, MD  metoprolol tartrate (LOPRESSOR) 25 MG tablet Take 1 tablet (25 mg total) by mouth 2  (two) times daily. 04/30/19   Henreitta Leber, MD  Pancrelipase, Lip-Prot-Amyl, (CREON) 24000-76000 units CPEP Take 48,000 Units by mouth 3 (three) times daily with meals.    [provider]  sodium bicarbonate 650 MG tablet Take 650 mg by mouth 2 (two) times daily.    [provider]  tamsulosin (FLOMAX) 0.4 MG CAPS capsule Take 1 capsule (0.4 mg total) by mouth daily. 04/30/19   Henreitta Leber, MD    Allergies Gabapentin  Family History  Problem Relation Age of Onset   Kidney failure Mother    Heart disease Father    Kidney failure Father    Heart disease Brother     Social History Social History   Tobacco Use   Smoking status: Former Smoker    Packs/day: 0.10    Years: 25.00    Pack years: 2.50    Types: Cigarettes    Quit date: 10/31/1978  Years since quitting: 40.5   Smokeless tobacco: Never Used   Tobacco comment: smoked 2-3 cigarettes/day  Substance Use Topics   Alcohol use: Not Currently    Comment: previously drank heavily but not in many years   Drug use: No      Review of Systems Constitutional: No fever/chills Eyes: Swelling to the right eye ENT: No sore throat. Cardiovascular: Denies chest pain. Respiratory: Denies shortness of breath. Gastrointestinal: No abdominal pain.  No nausea, no vomiting.  No diarrhea.  No constipation. Genitourinary: Negative for dysuria. Musculoskeletal: Negative for back pain. Skin: Negative for rash. Neurological: Negative for headaches, focal weakness or numbness.  Positive for falls All other ROS negative ____________________________________________   PHYSICAL EXAM:  VITAL SIGNS: ED Triage Vitals [05/09/19 1845]  Enc Vitals Group     BP (!) 147/60     Pulse Rate (!) 56     Resp 18     Temp 97.8 F (36.6 C)     Temp Source Oral     SpO2 100 %     Weight      Height      Head Circumference      Peak Flow      Pain Score      Pain Loc      Pain Edu?      Excl. in [place]?      Constitutional: Alert and oriented. Well appearing and in no acute distress. Eyes: Swelling to the right eye.  When I manually hold eye open pupil is reactive 3-2 EOMI, denies changes to his vision.  His left eye is prosthetic Head: Atraumatic. Nose: No congestion/rhinnorhea. Mouth/Throat: Mucous membranes are moist.   Neck: No stridor. Trachea Midline. FROM Cardiovascular: Normal rate, regular rhythm. Grossly normal heart sounds.  Good peripheral circulation. Respiratory: Normal respiratory effort.  No retractions. Lungs CTAB. Gastrointestinal: Soft and nontender. No distention. No abdominal bruits.  Musculoskeletal: No lower extremity tenderness nor edema.  No joint effusions. Neurologic:  Normal speech and language. No gross focal neurologic deficits are appreciated.  Skin:  Skin is warm, dry and intact. No rash noted. Psychiatric: Mood and affect are normal. Speech and behavior are normal. GU: Deferred   ____________________________________________   LABS (all labs ordered are listed, but only abnormal results are displayed)  Labs Reviewed  CBC WITH DIFFERENTIAL/PLATELET - Abnormal; Notable for the following components:      Result Value   RBC 2.63 (*)    Hemoglobin 7.6 (*)    HCT 24.1 (*)    Platelets 141 (*)    All other components within normal limits  BASIC METABOLIC PANEL - Abnormal; Notable for the following components:   CO2 19 (*)    Glucose, Bld 165 (*)    BUN 54 (*)    Creatinine, Ser 3.96 (*)    Calcium 7.6 (*)    GFR calc non Af Amer 15 (*)    GFR calc Af Amer 17 (*)    All other components within normal limits  PROTIME-INR - Abnormal; Notable for the following components:   Prothrombin Time 15.3 (*)    All other components within normal limits  APTT - Abnormal; Notable for the following components:   aPTT 39 (*)    All other components within normal limits  URINALYSIS, ROUTINE W REFLEX MICROSCOPIC    ____________________________________________   ED ECG REPORT I, Vanessa [place], the attending physician, personally viewed and interpreted this ECG.  EKG is sinus bradycardia rate of 48,  no ST elevation, no T wave inversion, QTC of 480 ____________________________________________  RADIOLOGY   Official radiology report(s): Ct Head Wo Contrast  Result Date: 05/09/2019 CLINICAL DATA:  Head trauma.  Mechanical fall with right eye injury. EXAM: CT HEAD WITHOUT CONTRAST CT MAXILLOFACIAL WITHOUT CONTRAST CT CERVICAL SPINE WITHOUT CONTRAST TECHNIQUE: Multidetector CT imaging of the head, cervical spine, and maxillofacial structures were performed using the standard protocol without intravenous contrast. Multiplanar CT image reconstructions of the cervical spine and maxillofacial structures were also generated. COMPARISON:  Head CT 04/24/2019 FINDINGS: CT HEAD FINDINGS Brain: There is no mass, hemorrhage or extra-axial collection. There is generalized atrophy without lobar predilection. Areas of hypoattenuation of the deep gray nuclei and confluent periventricular white matter hypodensity, consistent with chronic small vessel disease. Vascular: No abnormal hyperdensity of the major intracranial arteries or dural venous sinuses. No intracranial atherosclerosis. Skull: Intermediate sized right periorbital hematoma. No skull fracture. CT MAXILLOFACIAL FINDINGS Osseous: --Complex facial fracture types: No LeFort, zygomaticomaxillary complex or nasoorbitoethmoidal fracture. --Simple fracture types: None. --Mandible: No fracture or dislocation. 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 There is a right periorbital hematoma. The right globe is normal. Status post lens replacement. Sinuses: No fluid levels or advanced mucosal thickening. Soft tissues: Right periorbital hematoma CT CERVICAL SPINE FINDINGS Alignment: No static subluxation. Facets are aligned. Occipital condyles and the lateral masses of C1-C2  are aligned. Skull base and vertebrae: No acute fracture. Soft tissues and spinal canal: No prevertebral fluid or swelling. No visible canal hematoma. Disc levels: C6-7 disc space narrowing and uncovertebral hypertrophy. Upper chest: No pneumothorax, pulmonary nodule or pleural effusion. Other: Normal visualized paraspinal cervical soft tissues. IMPRESSION: 1. No acute intracranial abnormality. 2. No fracture or static subluxation of the cervical spine. 3. Large right periorbital hematoma without orbital injury or skull fracture. Electronically Signed   By: Ulyses Jarred M.D.   On: 05/09/2019 19:48   Ct Cervical Spine Wo Contrast  Result Date: 05/09/2019 CLINICAL DATA:  Head trauma.  Mechanical fall with right eye injury. EXAM: CT HEAD WITHOUT CONTRAST CT MAXILLOFACIAL WITHOUT CONTRAST CT CERVICAL SPINE WITHOUT CONTRAST TECHNIQUE: Multidetector CT imaging of the head, cervical spine, and maxillofacial structures were performed using the standard protocol without intravenous contrast. Multiplanar CT image reconstructions of the cervical spine and maxillofacial structures were also generated. COMPARISON:  Head CT 04/24/2019 FINDINGS: CT HEAD FINDINGS Brain: There is no mass, hemorrhage or extra-axial collection. There is generalized atrophy without lobar predilection. Areas of hypoattenuation of the deep gray nuclei and confluent periventricular white matter hypodensity, consistent with chronic small vessel disease. Vascular: No abnormal hyperdensity of the major intracranial arteries or dural venous sinuses. No intracranial atherosclerosis. Skull: Intermediate sized right periorbital hematoma. No skull fracture. CT MAXILLOFACIAL FINDINGS Osseous: --Complex facial fracture types: No LeFort, zygomaticomaxillary complex or nasoorbitoethmoidal fracture. --Simple fracture types: None. --Mandible: No fracture or dislocation. 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 There is a right periorbital hematoma. The  right globe is normal. Status post lens replacement. Sinuses: No fluid levels or advanced mucosal thickening. Soft tissues: Right periorbital hematoma CT CERVICAL SPINE FINDINGS Alignment: No static subluxation. Facets are aligned. Occipital condyles and the lateral masses of C1-C2 are aligned. Skull base and vertebrae: No acute fracture. Soft tissues and spinal canal: No prevertebral fluid or swelling. No visible canal hematoma. Disc levels: C6-7 disc space narrowing and uncovertebral hypertrophy. Upper chest: No pneumothorax, pulmonary nodule or pleural effusion. Other: Normal visualized paraspinal cervical soft tissues. IMPRESSION: 1. No acute intracranial abnormality.  2. No fracture or static subluxation of the cervical spine. 3. Large right periorbital hematoma without orbital injury or skull fracture. Electronically Signed   By: Ulyses Jarred M.D.   On: 05/09/2019 19:48   Ct Maxillofacial Wo Contrast  Result Date: 05/09/2019 CLINICAL DATA:  Head trauma.  Mechanical fall with right eye injury. EXAM: CT HEAD WITHOUT CONTRAST CT MAXILLOFACIAL WITHOUT CONTRAST CT CERVICAL SPINE WITHOUT CONTRAST TECHNIQUE: Multidetector CT imaging of the head, cervical spine, and maxillofacial structures were performed using the standard protocol without intravenous contrast. Multiplanar CT image reconstructions of the cervical spine and maxillofacial structures were also generated. COMPARISON:  Head CT 04/24/2019 FINDINGS: CT HEAD FINDINGS Brain: There is no mass, hemorrhage or extra-axial collection. There is generalized atrophy without lobar predilection. Areas of hypoattenuation of the deep gray nuclei and confluent periventricular white matter hypodensity, consistent with chronic small vessel disease. Vascular: No abnormal hyperdensity of the major intracranial arteries or dural venous sinuses. No intracranial atherosclerosis. Skull: Intermediate sized right periorbital hematoma. No skull fracture. CT MAXILLOFACIAL FINDINGS  Osseous: --Complex facial fracture types: No LeFort, zygomaticomaxillary complex or nasoorbitoethmoidal fracture. --Simple fracture types: None. --Mandible: No fracture or dislocation. 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 There is a right periorbital hematoma. The right globe is normal. Status post lens replacement. Sinuses: No fluid levels or advanced mucosal thickening. Soft tissues: Right periorbital hematoma CT CERVICAL SPINE FINDINGS Alignment: No static subluxation. Facets are aligned. Occipital condyles and the lateral masses of C1-C2 are aligned. Skull base and vertebrae: No acute fracture. Soft tissues and spinal canal: No prevertebral fluid or swelling. No visible canal hematoma. Disc levels: C6-7 disc space narrowing and uncovertebral hypertrophy. Upper chest: No pneumothorax, pulmonary nodule or pleural effusion. Other: Normal visualized paraspinal cervical soft tissues. IMPRESSION: 1. No acute intracranial abnormality. 2. No fracture or static subluxation of the cervical spine. 3. Large right periorbital hematoma without orbital injury or skull fracture. Electronically Signed   By: Ulyses Jarred M.D.   On: 05/09/2019 19:48    ____________________________________________   PROCEDURES  Procedure(s) performed (including Critical Care):  .Critical Care Performed by: Vanessa Jewell, MD Authorized by: Vanessa Silex, MD   Critical care provider statement:    Critical care time (minutes):  30   Critical care was necessary to treat or prevent imminent or life-threatening deterioration of the following conditions:  Renal failure (symptomatic anemia requiring blood transfusion)   Critical care was time spent personally by me on the following activities:  Discussions with consultants, evaluation of patient's response to treatment, examination of patient, ordering and performing treatments and interventions, ordering and review of laboratory studies, ordering and review of radiographic  studies, pulse oximetry, re-evaluation of patient's condition, obtaining history from patient or surrogate and review of old charts     ____________________________________________   INITIAL IMPRESSION / ASSESSMENT AND PLAN / ED COURSE  Cameron Adams was evaluated in Emergency Department on 05/09/2019 for the symptoms described in the history of present illness. He was evaluated in the context of the global COVID-19 pandemic, which necessitated consideration that the patient might be at risk for infection with the SARS-CoV-2 virus that causes COVID-19. Institutional protocols and algorithms that pertain to the evaluation of patients at risk for COVID-19 are in a state of rapid change based on information released by regulatory bodies including the CDC and federal and state organizations. These policies and algorithms were followed during the patient's care in the ED.    Patient presents with mechanical  fall when he was try to get out of his wheelchair.  Will get labs to evaluate for AKI, anemia.  Will get CT head evaluate for epidural subdural hematoma.  CT cervical to evaluate fracture and CT face to evaluate for orbital fracture.  Patient denies any abdominal tenderness to suggest abdominal injury and denies any chest wall tenderness to suggest thoracic injury.   Labs notable for creatinine of 3.96. Patient hemoglobin is also down to 7.6.  This is been trending down over the past 2 weeks from 9-7.6.   CT scan notable for large left periorbital hematoma without orbital injury or skull fracture.   Attempted to call [facility] x[facility] without answer.  Rectal exam with brown stool.  Hemoccult negative.  Pt said he did have a transfusion one year ago.   Patient is on aspirin and Plavix.  Unclear why his hemoglobin is so low.  Patient does endorse some weakness.  Patient will most likely need a transfusion.  Will put in for 1 unit of blood.  Patient also given 1 L of fluid for his worsening acute  on chronic kidney injury.  Reevaluated patient and does not have any vision changes from baseline in his right eye.  Will need to closely follow his eye exam given he is on a blood thinner to make sure that he does not develop any worsening vision changes.  D/w Levada Dy and admitted to the hospital team.     ____________________________________________   FINAL CLINICAL IMPRESSION(S) / ED DIAGNOSES   Final diagnoses:  Acute renal failure superimposed on chronic kidney disease, unspecified CKD stage, unspecified acute renal failure type ([place])  Fall, initial encounter  Anemia, unspecified type      MEDICATIONS GIVEN DURING THIS VISIT:  Medications  0.9 %  sodium chloride infusion (has no administration in time range)  sodium chloride 0.9 % bolus 1,000 mL (1,000 mLs Intravenous New Bag/Given 05/09/19 2019)     ED Discharge Orders    None       Note:  This document was prepared using Dragon voice recognition software and may include unintentional dictation errors.   Vanessa [place], MD 05/09/19 2047    Vanessa [place], MD 05/18/19 1314

## 2019-05-09 NOTE — ED Notes (Signed)
Pt given crackers and drink at this time

## 2019-05-09 NOTE — ED Notes (Signed)
Pt trying to get out of bed. Pt redirected at this time. Pt advised that we are waiting on room assignment for him. Pt given warm blanket and food

## 2019-05-09 NOTE — ED Notes (Signed)
Called and gave daughter update and room assignment

## 2019-05-10 ENCOUNTER — Inpatient Hospital Stay: Payer: Medicare Other

## 2019-05-10 ENCOUNTER — Encounter: Payer: Self-pay | Admitting: Urology

## 2019-05-10 ENCOUNTER — Ambulatory Visit: Payer: Medicare Other | Admitting: Urology

## 2019-05-10 DIAGNOSIS — R419 Unspecified symptoms and signs involving cognitive functions and awareness: Secondary | ICD-10-CM

## 2019-05-10 DIAGNOSIS — R4182 Altered mental status, unspecified: Secondary | ICD-10-CM

## 2019-05-10 LAB — BASIC METABOLIC PANEL
Anion gap: 4 — ABNORMAL LOW (ref 5–15)
BUN: 53 mg/dL — ABNORMAL HIGH (ref 8–23)
CO2: 22 mmol/L (ref 22–32)
Calcium: 7.8 mg/dL — ABNORMAL LOW (ref 8.9–10.3)
Chloride: 115 mmol/L — ABNORMAL HIGH (ref 98–111)
Creatinine, Ser: 3.95 mg/dL — ABNORMAL HIGH (ref 0.61–1.24)
GFR calc Af Amer: 17 mL/min — ABNORMAL LOW (ref >60)
GFR calc non Af Amer: 15 mL/min — ABNORMAL LOW (ref >60)
Glucose, Bld: 81 mg/dL (ref 70–99)
Potassium: 5 mmol/L (ref 3.5–5.1)
Sodium: 141 mmol/L (ref 135–145)

## 2019-05-10 LAB — TYPE AND SCREEN
ABO/RH(D): O POS
Antibody Screen: NEGATIVE
Unit division: 0

## 2019-05-10 LAB — BPAM RBC
Blood Product Expiration Date: 202008132359
ISSUE DATE / TIME: 202008032324
Unit Type and Rh: 5100

## 2019-05-10 LAB — CBC
HCT: 26.9 % — ABNORMAL LOW (ref 39.0–52.0)
HCT: 27.8 % — ABNORMAL LOW (ref 39.0–52.0)
Hemoglobin: 8.6 g/dL — ABNORMAL LOW (ref 13.0–17.0)
Hemoglobin: 8.8 g/dL — ABNORMAL LOW (ref 13.0–17.0)
MCH: 28.7 pg (ref 26.0–34.0)
MCH: 29.4 pg (ref 26.0–34.0)
MCHC: 32 g/dL (ref 30.0–36.0)
MCHC: 31.7 g/dL (ref 30.0–36.0)
MCV: 89.7 fL (ref 80.0–100.0)
MCV: 93 fL (ref 80.0–100.0)
Platelets: 137 10*3/uL — ABNORMAL LOW (ref 150–400)
Platelets: 136 10*3/uL — ABNORMAL LOW (ref 150–400)
RBC: 3 MIL/uL — ABNORMAL LOW (ref 4.22–5.81)
RBC: 2.99 MIL/uL — ABNORMAL LOW (ref 4.22–5.81)
RDW: 13.8 % (ref 11.5–15.5)
RDW: 14.4 % (ref 11.5–15.5)
WBC: 5.3 10*3/uL (ref 4.0–10.5)
WBC: 8.2 10*3/uL (ref 4.0–10.5)
nRBC: 0 % (ref 0.0–0.2)
nRBC: 0 % (ref 0.0–0.2)

## 2019-05-10 LAB — GLUCOSE, CAPILLARY
Glucose-Capillary: 87 mg/dL (ref 70–99)
Glucose-Capillary: 96 mg/dL (ref 70–99)
Glucose-Capillary: 122 mg/dL — ABNORMAL HIGH (ref 70–99)
Glucose-Capillary: 113 mg/dL — ABNORMAL HIGH (ref 70–99)

## 2019-05-10 LAB — SARS CORONAVIRUS 2 BY RT PCR (HOSPITAL ORDER, PERFORMED IN ~~LOC~~ HOSPITAL LAB): SARS Coronavirus 2: NEGATIVE

## 2019-05-10 LAB — TSH: TSH: 17.536 u[IU]/mL — ABNORMAL HIGH (ref 0.350–4.500)

## 2019-05-10 LAB — AMMONIA: Ammonia: 19 umol/L (ref 9–35)

## 2019-05-10 MED ORDER — PROMETHAZINE HCL 25 MG/ML IJ SOLN
12.5000 mg | Freq: Four times a day (QID) | INTRAMUSCULAR | Status: DC | PRN
  Administered 2019-05-10: 12.5 mg via INTRAVENOUS
  Filled 2019-05-10: qty 1

## 2019-05-10 MED ORDER — PANTOPRAZOLE SODIUM 40 MG IV SOLR
40.0000 mg | Freq: Two times a day (BID) | INTRAVENOUS | Status: DC
  Administered 2019-05-11 – 2019-05-17 (×14): 40 mg via INTRAVENOUS
  Filled 2019-05-10 (×14): qty 40

## 2019-05-10 MED ORDER — HALOPERIDOL LACTATE 5 MG/ML IJ SOLN
5.0000 mg | Freq: Once | INTRAMUSCULAR | Status: AC
  Administered 2019-05-10: 5 mg via INTRAMUSCULAR
  Filled 2019-05-10: qty 1

## 2019-05-10 MED ORDER — LEVOTHYROXINE SODIUM 125 MCG PO TABS
125.0000 ug | ORAL_TABLET | Freq: Every day | ORAL | Status: DC
  Administered 2019-05-12: 06:00:00 125 ug via ORAL
  Filled 2019-05-10 (×3): qty 1

## 2019-05-10 NOTE — Consult Note (Signed)
Reason for Consult:AMS Referring Physician: Hospitalist  CC: AMS  HPI:  Unable to obtain history from the patient HPI by notes and chart  "Cameron Adams  is a 68 y.o. male with a known history of diabetes mellitus, CKD, diastolic CHF, history of seizure, hypothyroidism, and history of urinary retention.  Patient presented to the emergency room after sustaining a fall while attempting to get into his wheelchair.  He recalls hitting his right eye on a piece of metal however is unsure what this was.  There is edema to his right eye with tenderness.  No lacerations or wounds noted.  He denies loss of consciousness he denies dizziness or headache.  He denies blurred vision however vision is diminished with edema.  Patient has a left eye prosthesis.  He is on anticoagulant with aspirin and Plavix.  Patient is known to have a decreased hemoglobin from 9 to 7.6 on arrival when compared to a 2-week period.  He denies hematemesis, hematochezia, or melena.  He denies abdominal pain.  He denies diarrhea, nausea, vomiting.  He denies recent fevers or chills.  He denies chest pain or shortness of breath.  Patient has a history of chronic renal failure with baseline labs including BUN of 30 and creatinine 2.27 on 04/30/2019.  Labs completed in the emergency room demonstrate BUN of 54 and creatinine 3.96 with hemoglobin 7.6 and hematocrit 24.1 and platelet count 141.  CT face is negative for acute findings or fractures He has been admitted to the hospitalist service for acute blood loss anemia with acute on chronic renal failure and generalized weakness." Patient had a head ct and ct spine ct maxillo facial wo acute abnlities Labs per epic   Past Medical History:  Diagnosis Date  . Anemia of chronic disease   . Blindness of left eye   . Chronic kidney disease, stage III (moderate) (HCC)   . Diabetes ([place])   . Diarrhea   . Diastolic dysfunction    a. 06/2017 Echo: EF 55-60%, no rwma, Gr2 DD, Asc Ao 3.9cm, nl  RV fxn, nl PASP.  Marland Kitchen Hypertension   . Hypothyroidism   . Monoclonal gammopathy of unknown significance (MGUS)    a. prev seen @ [facility].  Lost to f/u - never had BM bx.  . Orthostatic hypotension   . Syncope and collapse     Past Surgical History:  Procedure Laterality Date  . 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 PERIPHERAL VASCULAR CATHETERIZATION N/A 09/15/2016   Procedure: Lower Extremity Angiography;  Surgeon: Algernon Huxley, MD;  Location: [facility];  Service: Cardiovascular;  Laterality: N/A;  . THYROID SURGERY      Family History  Problem Relation Age of Onset  . Kidney failure Mother   . Heart disease Father   . Kidney failure Father   . Heart disease Brother     Social History:  reports that he quit smoking about 40 years ago. His smoking use included cigarettes. He has a 2.50 pack-year smoking history. He has never used smokeless tobacco. He reports previous alcohol use. He reports that he does not use drugs.  Allergies  Allergen Reactions  . Gabapentin     Other reaction(s): Other (See Comments) Trouble sleeping     Medications: I have reviewed the patient's current medications.  ROS: uanble to perform due to MS Physical Examination: Blood pressure 124/88, pulse  95, temperature (!) 97.5 F (36.4 C), temperature source Oral, resp. rate 20, height 5\' 11"  (1.803 m), weight 68 kg, SpO2 96 %.  Neurologic Examination Patient lying on bed, eyes closed, states his name when asked, follows commands, speech seems little dysarthric. Ha s a foley inserted CN: PERLA, eomi ( difficult to assess due to eyelid edema), face symmetrical , no nystagamus, uvula tongue midline Motor: 4/5 in all extremities Sensory examination to light touch diffciutl to conduct No coordination deficit DTR and gait not checked.  Results for orders placed or performed  during the hospital encounter of 05/09/19 (from the past 48 hour(s))  CBC with Differential     Status: Abnormal   Collection Time: 05/09/19  7:07 PM  Result Value Ref Range   WBC 4.7 4.0 - 10.5 K/uL   RBC 2.63 (L) 4.22 - 5.81 MIL/uL   Hemoglobin 7.6 (L) 13.0 - 17.0 g/dL   HCT 24.1 (L) 39.0 - 52.0 %   MCV 91.6 80.0 - 100.0 fL   MCH 28.9 26.0 - 34.0 pg   MCHC 31.5 30.0 - 36.0 g/dL   RDW 14.0 11.5 - 15.5 %   Platelets 141 (L) 150 - 400 K/uL   nRBC 0.0 0.0 - 0.2 %   Neutrophils Relative % 63 %   Neutro Abs 2.9 1.7 - 7.7 K/uL   Lymphocytes Relative 23 %   Lymphs Abs 1.1 0.7 - 4.0 K/uL   Monocytes Relative 7 %   Monocytes Absolute 0.4 0.1 - 1.0 K/uL   Eosinophils Relative 6 %   Eosinophils Absolute 0.3 0.0 - 0.5 K/uL   Basophils Relative 0 %   Basophils Absolute 0.0 0.0 - 0.1 K/uL   Immature Granulocytes 1 %   Abs Immature Granulocytes 0.03 0.00 - 0.07 K/uL    Comment: Performed at [facility], [address] [place], [place] [address]  Basic metabolic panel     Status: Abnormal   Collection Time: 05/09/19  7:07 PM  Result Value Ref Range   Sodium 135 135 - 145 mmol/L   Potassium 5.0 3.5 - 5.1 mmol/L   Chloride 111 98 - 111 mmol/L   CO2 19 (L) 22 - 32 mmol/L   Glucose, Bld 165 (H) 70 - 99 mg/dL   BUN 54 (H) 8 - 23 mg/dL   Creatinine, Ser 3.96 (H) 0.61 - 1.24 mg/dL   Calcium 7.6 (L) 8.9 - 10.3 mg/dL   GFR calc non Af Amer 15 (L) >60 mL/min   GFR calc Af Amer 17 (L) >60 mL/min   Anion gap 5 5 - 15    Comment: Performed at [facility], [address] [place], [place] [address]  Protime-INR     Status: Abnormal   Collection Time: 05/09/19  7:07 PM  Result Value Ref Range   Prothrombin Time 15.3 (H) 11.4 - 15.2 seconds   INR 1.2 0.8 - 1.2    Comment: (NOTE) INR goal varies based on device and disease states. Performed at [facility], [address] [place], [place] [address]   APTT     Status: Abnormal   Collection Time: 05/09/19  7:07 PM   Result Value Ref Range   aPTT 39 (H) 24 - 36 seconds    Comment:        IF BASELINE aPTT IS ELEVATED, SUGGEST PATIENT RISK ASSESSMENT BE USED TO DETERMINE APPROPRIATE ANTICOAGULANT THERAPY. Performed at [facility], [address] [place], [place] [address]   ABO/Rh  Status: None   Collection Time: 05/09/19  7:07 PM  Result Value Ref Range   ABO/RH(D)      O POS Performed at [facility], [address] [place], [place] [address]   Glucose, capillary     Status: Abnormal   Collection Time: 05/09/19  8:14 PM  Result Value Ref Range   Glucose-Capillary 146 (H) 70 - 99 mg/dL  Prepare RBC     Status: None   Collection Time: 05/09/19  9:00 PM  Result Value Ref Range   Order Confirmation      ORDER PROCESSED BY BLOOD BANK Performed at [facility], [address] [place], [place] [address]   Type and screen [place]     Status: None   Collection Time: 05/09/19  9:15 PM  Result Value Ref Range   ABO/RH(D) O POS    Antibody Screen NEG    Sample Expiration 05/12/2019,2359    Unit Number [id number]    Blood Component Type RED CELLS,LR    Unit division 00    Status of Unit ISSUED,FINAL    Transfusion Status OK TO TRANSFUSE    Crossmatch Result      Compatible Performed at [facility], [address] [place],  [address]   SARS Coronavirus 2 [facility] order, Performed in [place] hospital lab)     Status: None   Collection Time: 05/09/19  9:28 PM  Result Value Ref Range   SARS Coronavirus 2 NEGATIVE NEGATIVE    Comment: (NOTE) If result is NEGATIVE SARS-CoV-2 target nucleic acids are NOT DETECTED. The SARS-CoV-2 RNA is generally detectable in upper and lower  respiratory specimens during the acute phase of infection. The lowest  concentration of SARS-CoV-2 viral copies this assay can detect is 250  copies / mL. A negative result does not preclude SARS-CoV-2 infection  and should not be used  as the sole basis for treatment or other  patient management decisions.  A negative result may occur with  improper specimen collection / handling, submission of specimen other  than nasopharyngeal swab, presence of viral mutation(s) within the  areas targeted by this assay, and inadequate number of viral copies  (<250 copies / mL). A negative result must be combined with clinical  observations, patient history, and epidemiological information. If result is POSITIVE SARS-CoV-2 target nucleic acids are DETECTED. The SARS-CoV-2 RNA is generally detectable in upper and lower  respiratory specimens dur ing the acute phase of infection.  Positive  results are indicative of active infection with SARS-CoV-2.  Clinical  correlation with patient history and other diagnostic information is  necessary to determine patient infection status.  Positive results do  not rule out bacterial infection or co-infection with other viruses. If result is PRESUMPTIVE POSTIVE SARS-CoV-2 nucleic acids MAY BE PRESENT.   A presumptive positive result was obtained on the submitted specimen  and confirmed on repeat testing.  While 2019 novel coronavirus  (SARS-CoV-2) nucleic acids may be present in the submitted sample  additional confirmatory testing may be necessary for epidemiological  and / or clinical management purposes  to differentiate between  SARS-CoV-2 and other Sarbecovirus currently known to infect humans.  If clinically indicated additional testing with an alternate test  methodology [phone]) is advised. The SARS-CoV-2 RNA is generally  detectable in upper and lower respiratory sp ecimens during the acute  phase of infection. The expected result is Negative. Fact Sheet for Patients:  StrictlyIdeas.no Fact Sheet for Healthcare Providers:  BankingDealers.co.za This test is not yet approved or cleared by the [place] and has been authorized for  detection and/or diagnosis of SARS-CoV-2 by FDA under an Emergency Use Authorization (EUA).  This EUA will remain in effect (meaning this test can be used) for the duration of the COVID-19 declaration under Section 564(b)(1) of the Act, 21 U.S.C. section 360bbb-3(b)(1), unless the authorization is terminated or revoked sooner. Performed at [facility], [address] [place], [place] [address]   Glucose, capillary     Status: None   Collection Time: 05/09/19 10:16 PM  Result Value Ref Range   Glucose-Capillary 90 70 - 99 mg/dL  TSH     Status: Abnormal   Collection Time: 05/10/19  4:05 AM  Result Value Ref Range   TSH 17.536 (H) 0.350 - 4.500 uIU/mL    Comment: Performed by a 3rd Generation assay with a functional sensitivity of <=0.01 uIU/mL. Performed at [facility], [address] [place], [place] [address]   Basic metabolic panel     Status: Abnormal   Collection Time: 05/10/19  4:05 AM  Result Value Ref Range   Sodium 141 135 - 145 mmol/L   Potassium 5.0 3.5 - 5.1 mmol/L   Chloride 115 (H) 98 - 111 mmol/L   CO2 22 22 - 32 mmol/L   Glucose, Bld 81 70 - 99 mg/dL   BUN 53 (H) 8 - 23 mg/dL   Creatinine, Ser 3.95 (H) 0.61 - 1.24 mg/dL   Calcium 7.8 (L) 8.9 - 10.3 mg/dL   GFR calc non Af Amer 15 (L) >60 mL/min   GFR calc Af Amer 17 (L) >60 mL/min   Anion gap 4 (L) 5 - 15    Comment: Performed at [facility], [address] [place], [place] [address]  CBC     Status: Abnormal   Collection Time: 05/10/19  4:05 AM  Result Value Ref Range   WBC 5.3 4.0 - 10.5 K/uL   RBC 3.00 (L) 4.22 - 5.81 MIL/uL   Hemoglobin 8.6 (L) 13.0 - 17.0 g/dL   HCT 26.9 (L) 39.0 - 52.0 %   MCV 89.7 80.0 - 100.0 fL   MCH 28.7 26.0 - 34.0 pg   MCHC 32.0 30.0 - 36.0 g/dL   RDW 13.8 11.5 - 15.5 %   Platelets 137 (L) 150 - 400 K/uL   nRBC 0.0 0.0 - 0.2 %    Comment: Performed at [facility], [address] [place], [place] [address]  Glucose, capillary      Status: None   Collection Time: 05/10/19  7:38 AM  Result Value Ref Range   Glucose-Capillary 87 70 - 99 mg/dL    Recent Results (from the past 240 hour(s))  Urine Culture     Status: None   Collection Time: 05/04/19  2:10 PM   Specimen: Urine, Clean Catch  Result Value Ref Range Status   Specimen Description   Final    URINE, CLEAN CATCH Performed at [facility], [address] [place], [place] [address]    Special Requests   Final    NONE Performed at [facility], [address] [place], [place] [address]    Culture   Final    NO GROWTH Performed at [facility], [place] [address] [place], [place] [address]    Report Status 05/05/2019 FINAL  Final  SARS Coronavirus 2 [facility] order, Performed in [facility] hospital lab)  Status: None   Collection Time: 05/09/19  9:28 PM  Result Value Ref Range Status   SARS Coronavirus 2 NEGATIVE NEGATIVE Final    Comment: (NOTE) If result is NEGATIVE SARS-CoV-2 target nucleic acids are NOT DETECTED. The SARS-CoV-2 RNA is generally detectable in upper and lower  respiratory specimens during the acute phase of infection. The lowest  concentration of SARS-CoV-2 viral copies this assay can detect is 250  copies / mL. A negative result does not preclude SARS-CoV-2 infection  and should not be used as the sole basis for treatment or other  patient management decisions.  A negative result may occur with  improper specimen collection / handling, submission of specimen other  than nasopharyngeal swab, presence of viral mutation(s) within the  areas targeted by this assay, and inadequate number of viral copies  (<250 copies / mL). A negative result must be combined with clinical  observations, patient history, and epidemiological information. If result is POSITIVE SARS-CoV-2 target nucleic acids are DETECTED. The SARS-CoV-2 RNA is generally detectable in upper and lower  respiratory specimens dur ing the acute  phase of infection.  Positive  results are indicative of active infection with SARS-CoV-2.  Clinical  correlation with patient history and other diagnostic information is  necessary to determine patient infection status.  Positive results do  not rule out bacterial infection or co-infection with other viruses. If result is PRESUMPTIVE POSTIVE SARS-CoV-2 nucleic acids MAY BE PRESENT.   A presumptive positive result was obtained on the submitted specimen  and confirmed on repeat testing.  While 2019 novel coronavirus  (SARS-CoV-2) nucleic acids may be present in the submitted sample  additional confirmatory testing may be necessary for epidemiological  and / or clinical management purposes  to differentiate between  SARS-CoV-2 and other Sarbecovirus currently known to infect humans.  If clinically indicated additional testing with an alternate test  methodology [phone]) is advised. The SARS-CoV-2 RNA is generally  detectable in upper and lower respiratory sp ecimens during the acute  phase of infection. The expected result is Negative. Fact Sheet for Patients:  StrictlyIdeas.no Fact Sheet for Healthcare Providers: BankingDealers.co.za This test is not yet approved or cleared by the [place] FDA and has been authorized for detection and/or diagnosis of SARS-CoV-2 by FDA under an Emergency Use Authorization (EUA).  This EUA will remain in effect (meaning this test can be used) for the duration of the COVID-19 declaration under Section 564(b)(1) of the Act, 21 U.S.C. section 360bbb-3(b)(1), unless the authorization is terminated or revoked sooner. Performed at [facility], [address] [place], [place] [address]     Ct Head Wo Contrast  Result Date: 05/09/2019 CLINICAL DATA:  Head trauma.  Mechanical fall with right eye injury. EXAM: CT HEAD WITHOUT CONTRAST CT MAXILLOFACIAL WITHOUT CONTRAST CT CERVICAL SPINE WITHOUT  CONTRAST TECHNIQUE: Multidetector CT imaging of the head, cervical spine, and maxillofacial structures were performed using the standard protocol without intravenous contrast. Multiplanar CT image reconstructions of the cervical spine and maxillofacial structures were also generated. COMPARISON:  Head CT 04/24/2019 FINDINGS: CT HEAD FINDINGS Brain: There is no mass, hemorrhage or extra-axial collection. There is generalized atrophy without lobar predilection. Areas of hypoattenuation of the deep gray nuclei and confluent periventricular white matter hypodensity, consistent with chronic small vessel disease. Vascular: No abnormal hyperdensity of the major intracranial arteries or dural venous sinuses. No intracranial atherosclerosis. Skull: Intermediate sized right periorbital hematoma. No skull fracture. CT MAXILLOFACIAL FINDINGS Osseous: --Complex facial fracture types: No  LeFort, zygomaticomaxillary complex or nasoorbitoethmoidal fracture. --Simple fracture types: None. --Mandible: No fracture or dislocation. 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 There is a right periorbital hematoma. The right globe is normal. Status post lens replacement. Sinuses: No fluid levels or advanced mucosal thickening. Soft tissues: Right periorbital hematoma CT CERVICAL SPINE FINDINGS Alignment: No static subluxation. Facets are aligned. Occipital condyles and the lateral masses of C1-C2 are aligned. Skull base and vertebrae: No acute fracture. Soft tissues and spinal canal: No prevertebral fluid or swelling. No visible canal hematoma. Disc levels: C6-7 disc space narrowing and uncovertebral hypertrophy. Upper chest: No pneumothorax, pulmonary nodule or pleural effusion. Other: Normal visualized paraspinal cervical soft tissues. IMPRESSION: 1. No acute intracranial abnormality. 2. No fracture or static subluxation of the cervical spine. 3. Large right periorbital hematoma without orbital injury or skull fracture.  Electronically Signed   By: Ulyses Jarred M.D.   On: 05/09/2019 19:48   Ct Cervical Spine Wo Contrast  Result Date: 05/09/2019 CLINICAL DATA:  Head trauma.  Mechanical fall with right eye injury. EXAM: CT HEAD WITHOUT CONTRAST CT MAXILLOFACIAL WITHOUT CONTRAST CT CERVICAL SPINE WITHOUT CONTRAST TECHNIQUE: Multidetector CT imaging of the head, cervical spine, and maxillofacial structures were performed using the standard protocol without intravenous contrast. Multiplanar CT image reconstructions of the cervical spine and maxillofacial structures were also generated. COMPARISON:  Head CT 04/24/2019 FINDINGS: CT HEAD FINDINGS Brain: There is no mass, hemorrhage or extra-axial collection. There is generalized atrophy without lobar predilection. Areas of hypoattenuation of the deep gray nuclei and confluent periventricular white matter hypodensity, consistent with chronic small vessel disease. Vascular: No abnormal hyperdensity of the major intracranial arteries or dural venous sinuses. No intracranial atherosclerosis. Skull: Intermediate sized right periorbital hematoma. No skull fracture. CT MAXILLOFACIAL FINDINGS Osseous: --Complex facial fracture types: No LeFort, zygomaticomaxillary complex or nasoorbitoethmoidal fracture. --Simple fracture types: None. --Mandible: No fracture or dislocation. 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 There is a right periorbital hematoma. The right globe is normal. Status post lens replacement. Sinuses: No fluid levels or advanced mucosal thickening. Soft tissues: Right periorbital hematoma CT CERVICAL SPINE FINDINGS Alignment: No static subluxation. Facets are aligned. Occipital condyles and the lateral masses of C1-C2 are aligned. Skull base and vertebrae: No acute fracture. Soft tissues and spinal canal: No prevertebral fluid or swelling. No visible canal hematoma. Disc levels: C6-7 disc space narrowing and uncovertebral hypertrophy. Upper chest: No pneumothorax,  pulmonary nodule or pleural effusion. Other: Normal visualized paraspinal cervical soft tissues. IMPRESSION: 1. No acute intracranial abnormality. 2. No fracture or static subluxation of the cervical spine. 3. Large right periorbital hematoma without orbital injury or skull fracture. Electronically Signed   By: Ulyses Jarred M.D.   On: 05/09/2019 19:48   Ct Maxillofacial Wo Contrast  Result Date: 05/09/2019 CLINICAL DATA:  Head trauma.  Mechanical fall with right eye injury. EXAM: CT HEAD WITHOUT CONTRAST CT MAXILLOFACIAL WITHOUT CONTRAST CT CERVICAL SPINE WITHOUT CONTRAST TECHNIQUE: Multidetector CT imaging of the head, cervical spine, and maxillofacial structures were performed using the standard protocol without intravenous contrast. Multiplanar CT image reconstructions of the cervical spine and maxillofacial structures were also generated. COMPARISON:  Head CT 04/24/2019 FINDINGS: CT HEAD FINDINGS Brain: There is no mass, hemorrhage or extra-axial collection. There is generalized atrophy without lobar predilection. Areas of hypoattenuation of the deep gray nuclei and confluent periventricular white matter hypodensity, consistent with chronic small vessel disease. Vascular: No abnormal hyperdensity of the major intracranial arteries or dural venous sinuses. No intracranial  atherosclerosis. Skull: Intermediate sized right periorbital hematoma. No skull fracture. CT MAXILLOFACIAL FINDINGS Osseous: --Complex facial fracture types: No LeFort, zygomaticomaxillary complex or nasoorbitoethmoidal fracture. --Simple fracture types: None. --Mandible: No fracture or dislocation. 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 There is a right periorbital hematoma. The right globe is normal. Status post lens replacement. Sinuses: No fluid levels or advanced mucosal thickening. Soft tissues: Right periorbital hematoma CT CERVICAL SPINE FINDINGS Alignment: No static subluxation. Facets are aligned. Occipital condyles and  the lateral masses of C1-C2 are aligned. Skull base and vertebrae: No acute fracture. Soft tissues and spinal canal: No prevertebral fluid or swelling. No visible canal hematoma. Disc levels: C6-7 disc space narrowing and uncovertebral hypertrophy. Upper chest: No pneumothorax, pulmonary nodule or pleural effusion. Other: Normal visualized paraspinal cervical soft tissues. IMPRESSION: 1. No acute intracranial abnormality. 2. No fracture or static subluxation of the cervical spine. 3. Large right periorbital hematoma without orbital injury or skull fracture. Electronically Signed   By: Ulyses Jarred M.D.   On: 05/09/2019 19:48     Assessment/Plan:   68 y.o. male with a known history of diabetes mellitus, CKD, diastolic CHF, history of seizure, hypothyroidism, and history of urinary retention admitted for AMS. AMS most likely multifactoriel includingi metabolic/electrolytes abnties, infections. Pateitn has a hs/o of sz on depakote. REcs: Neuro protectives measures including normothermai, normoglycemia, correct electrolytes/metabolic abnlities, treat any infection if present Continue depakote 500 mg bid. Obtain Ammonia level Freq neurocheck and if deteriotates obtain head ct stat. ( given fact he is on plavix/asa) Remaining of treatment by primary team. Will f.up 05/10/2019, 10:48 AM

## 2019-05-10 NOTE — TOC Initial Note (Signed)
Transition of Care [facility]) - Initial/Assessment Note    Patient Details  Name: Cameron Adams MRN: [id number] Date of Birth: 1950-11-15  Transition of Care [facility]) CM/SW Contact:    Cameron Chroman, LCSW Phone Number: 05/10/2019, 2:40 PM  Clinical Narrative:  Patient is not oriented. CSW called his daughter/HCPOA Cameron Adams, introduced role, and explained that discharge planning would be discussed. Daughter confirmed he was admitted from [facility] where he was for short-term rehab. She wants him to return at discharge. No further concerns. CSW encouraged patient's daughter to contact CSW as needed. CSW will continue to follow patient and his daughter for support and facilitate return to SNF once medically stable.                Expected Discharge Plan: Skilled Nursing Facility Barriers to Discharge: Continued Medical Work up   Patient Goals and CMS Choice Patient states their goals for this hospitalization and ongoing recovery are:: Patient not oriented.   Choice offered to / list presented to : NA  Expected Discharge Plan and Services Expected Discharge Plan: [place] Choice: [place] Living arrangements for the past 2 months: [place], [place] Expected Discharge Date: 05/13/19                                    Prior Living Arrangements/Services Living arrangements for the past 2 months: [place], [place] Lives with:: Adult Children Patient language and need for interpreter reviewed:: Yes Do you feel safe going back to the place where you live?: Yes      Need for Family Participation in Patient Care: Yes (Comment) Care giver support system in place?: Yes (comment)   Criminal Activity/Legal Involvement Pertinent to Current Situation/Hospitalization: No - Comment as needed  Activities of Daily Living Home Assistive Devices/Equipment: Wheelchair, Environmental consultant  (specify type) ADL Screening (condition at time of admission) Patient's cognitive ability adequate to safely complete daily activities?: No Is the patient deaf or have difficulty hearing?: No Does the patient have difficulty seeing, even when wearing glasses/contacts?: Yes Does the patient have difficulty concentrating, remembering, or making decisions?: Yes Patient able to express need for assistance with ADLs?: No Does the patient have difficulty dressing or bathing?: Yes Independently performs ADLs?: No Communication: Needs assistance Does the patient have difficulty walking or climbing stairs?: Yes Weakness of Legs: Both Weakness of Arms/Hands: Both  Permission Sought/Granted Permission sought to share information with : Facility Sport and exercise psychologist, Family Supports Permission granted to share information with : (Patient not oriented.)  Share Information with NAME: Cameron Adams  Permission granted to share info w AGENCY: [facility]  Permission granted to share info w Relationship: Daughter/HCPOA  Permission granted to share info w Contact Information: [phone]  Emotional Assessment Appearance:: Appears stated age Attitude/Demeanor/Rapport: Unable to Assess Affect (typically observed): Unable to Assess Orientation: : (Disoriented x 4) Alcohol / Substance Use: Never Used Psych Involvement: Yes (comment)(Consulted)  Admission diagnosis:  Fall, initial encounter [W19.XXXA] Anemia, unspecified type [D64.9] Acute renal failure superimposed on chronic kidney disease, unspecified CKD stage, unspecified acute renal failure type ([place]) [N17.9, N18.9] Patient Active Problem List   Diagnosis Date Noted  . Dehydration 05/09/2019  . Confusion   . Visual hallucination   . Altered mental status 04/24/2019  . Syncope and collapse 01/12/2019  . Pancreatic insufficiency 12/28/2018  .  Chronic systolic CHF (congestive heart failure) ([employer]) 12/13/2018  . History of TIA (transient  ischemic attack) 12/13/2018  . Weakness   . AKI (acute kidney injury) ([place]) 12/04/2018  . Arm wound, right, initial encounter 07/21/2018  . Wound infection 07/21/2018  . Orthostatic hypotension dysautonomic syndrome ([place]) 07/07/2018  . Acute renal failure superimposed on stage 3 chronic kidney disease ([place]) 12/09/2017  . Dizziness 06/22/2017  . Hyperkalemia 06/16/2017  . Esophageal dysmotilities   . Dysphagia   . Rib fractures 05/17/2017  . Hemothorax, right 05/17/2017  . Recurrent major depressive disorder, in partial remission ([place]) 05/13/2017  . Pure hypercholesterolemia 05/13/2017  . IDDM (insulin dependent diabetes mellitus) ([place]) 05/13/2017  . Hypothyroidism (acquired) 05/13/2017  . GAD (generalized anxiety disorder) 05/13/2017  . CKD (chronic kidney disease) stage 3, GFR 30-59 ml/min (HCC) 05/13/2017  . Toe gangrene ([place]) 09/16/2016  . PVD (peripheral vascular disease) ([place]) 09/16/2016  . Uncontrolled diabetes mellitus ([place]) 09/16/2016  . Hyponatremia 09/16/2016  . Hypokalemia 09/16/2016  . Essential hypertension, malignant 09/16/2016  . Abnormal angiogram 09/16/2016  . Gangrene ([place]) 09/13/2016  . Pruritus 03/26/2016  . Orthostatic hypotension 03/26/2016  . 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 Alcohol abuse 03/26/2016  . Hyperglycemia 03/23/2016  . Passive suicidal ideations 09/16/2014  . MDD (major depressive disorder) 09/16/2014  . Dyspnea 11/09/2013  . CAP (community acquired pneumonia) 10/31/2013  . Chest pain 10/31/2013  . Chronic respiratory failure with hypoxia ([place]) 10/31/2013  . 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 Diabetic retinopathy ([place]) 06/16/2013  . Gynecomastia 04/18/2013   PCP:  [name], MD Pharmacy:   [id number] [place], [address] [place] [address] Phone: [phone] Fax: [phone]     Social Determinants of Health (SDOH) Interventions    Readmission Risk  Interventions Readmission Risk Prevention Plan 04/26/2019 01/16/2019  Transportation Screening Complete Complete  PCP or Specialist Appt within 3-5 Days - Complete  HRI or [place] - Complete  Social Work Consult for [place] Planning/Counseling - Complete  Palliative Care Screening - Not Applicable  Medication Review Press photographer) Complete Complete  HRI or Home Care Consult Complete -  SW Recovery Care/Counseling Consult Complete -  Palliative Care Screening Not Applicable -  [place] Patient Refused -  Some recent data might be hidden

## 2019-05-10 NOTE — Progress Notes (Signed)
[facility]  ROUNDING NOTE   Subjective:   [name] admitted to [facility] on 05/09/2019 for Fall, initial encounter [W19.XXXA] Anemia, unspecified type [D64.9] Acute renal failure superimposed on chronic kidney disease, unspecified CKD stage, unspecified acute renal failure type ([place]) [N17.9, N18.9]   With altered mental status.    Objective:  Vital signs in last 24 hours:  Temp:  [97.5 F (36.4 C)-98.2 F (36.8 C)] 98.1 F (36.7 C) (08/04 1138) Pulse Rate:  [48-137] 65 (08/04 1138) Resp:  [18-20] 20 (08/04 0246) BP: (124-188)/(47-94) 188/81 (08/04 1138) SpO2:  [96 %-100 %] 99 % (08/04 1138) Weight:  [68 kg] 68 kg (08/03 1857)  Weight change:  Filed Weights   05/09/19 1857  Weight: 68 kg    Intake/Output: I/O last 3 completed shifts: In: 599 [I.V.:209; Blood:390] Out: -    Intake/Output this shift:  Total I/O In: 240 [P.O.:240] Out: -   Physical Exam: General: NAD, laying in bed  Head: Normocephalic, atraumatic. Moist oral mucosal membranes  Eyes: Anicteric, PERRL  Neck: Supple, trachea midline  Lungs:  Clear to auscultation  Heart: Regular rate and rhythm  Abdomen:  Soft, nontender,   Extremities: no peripheral edema.  Neurologic: Somnolent.   Skin: No lesions  Access: none    Basic Metabolic Panel: Recent Labs  Lab 05/09/19 1907 05/10/19 0405  NA 135 141  K 5.0 5.0  CL 111 115*  CO2 19* 22  GLUCOSE 165* 81  BUN 54* 53*  CREATININE 3.96* 3.95*  CALCIUM 7.6* 7.8*    Liver Function Tests: No results for input(s): AST, ALT, ALKPHOS, BILITOT, PROT, ALBUMIN in the last 168 hours. No results for input(s): LIPASE, AMYLASE in the last 168 hours. No results for input(s): AMMONIA in the last 168 hours.  CBC: Recent Labs  Lab 05/09/19 1907 05/10/19 0405  WBC 4.7 5.3  NEUTROABS 2.9  --   HGB 7.6* 8.6*  HCT 24.1* 26.9*  MCV 91.6 89.7  PLT 141* 137*    Cardiac Enzymes: No results for input(s): CKTOTAL, CKMB, CKMBINDEX,  TROPONINI in the last 168 hours.  BNP: Invalid input(s): POCBNP  CBG: Recent Labs  Lab 05/09/19 2014 05/09/19 2216 05/10/19 0738 05/10/19 1136  GLUCAP 146* 90 87 96    Microbiology: Results for orders placed or performed during the hospital encounter of 05/09/19  SARS Coronavirus 2 [facility] order, Performed in [place] hospital lab)     Status: None   Collection Time: 05/09/19  9:28 PM  Result Value Ref Range Status   SARS Coronavirus 2 NEGATIVE NEGATIVE Final    Comment: (NOTE) If result is NEGATIVE SARS-CoV-2 target nucleic acids are NOT DETECTED. The SARS-CoV-2 RNA is generally detectable in upper and lower  respiratory specimens during the acute phase of infection. The lowest  concentration of SARS-CoV-2 viral copies this assay can detect is 250  copies / mL. A negative result does not preclude SARS-CoV-2 infection  and should not be used as the sole basis for treatment or other  patient management decisions.  A negative result may occur with  improper specimen collection / handling, submission of specimen other  than nasopharyngeal swab, presence of viral mutation(s) within the  areas targeted by this assay, and inadequate number of viral copies  (<250 copies / mL). A negative result must be combined with clinical  observations, patient history, and epidemiological information. If result is POSITIVE SARS-CoV-2 target nucleic acids are DETECTED. The SARS-CoV-2 RNA is generally detectable in upper and  lower  respiratory specimens dur ing the acute phase of infection.  Positive  results are indicative of active infection with SARS-CoV-2.  Clinical  correlation with patient history and other diagnostic information is  necessary to determine patient infection status.  Positive results do  not rule out bacterial infection or co-infection with other viruses. If result is PRESUMPTIVE POSTIVE SARS-CoV-2 nucleic acids MAY BE PRESENT.   A presumptive positive result was  obtained on the submitted specimen  and confirmed on repeat testing.  While 2019 novel coronavirus  (SARS-CoV-2) nucleic acids may be present in the submitted sample  additional confirmatory testing may be necessary for epidemiological  and / or clinical management purposes  to differentiate between  SARS-CoV-2 and other Sarbecovirus currently known to infect humans.  If clinically indicated additional testing with an alternate test  methodology [phone]) is advised. The SARS-CoV-2 RNA is generally  detectable in upper and lower respiratory sp ecimens during the acute  phase of infection. The expected result is Negative. Fact Sheet for Patients:  StrictlyIdeas.no Fact Sheet for Healthcare Providers: BankingDealers.co.za This test is not yet approved or cleared by the [place] FDA and has been authorized for detection and/or diagnosis of SARS-CoV-2 by FDA under an Emergency Use Authorization (EUA).  This EUA will remain in effect (meaning this test can be used) for the duration of the COVID-19 declaration under Section 564(b)(1) of the Act, 21 U.S.C. section 360bbb-3(b)(1), unless the authorization is terminated or revoked sooner. Performed at [facility], [address] [place], [place] [address]     Coagulation Studies: Recent Labs    05/09/19 1907  LABPROT 15.3*  INR 1.2    Urinalysis: No results for input(s): COLORURINE, LABSPEC, PHURINE, GLUCOSEU, HGBUR, BILIRUBINUR, KETONESUR, PROTEINUR, UROBILINOGEN, NITRITE, LEUKOCYTESUR in the last 72 hours.  Invalid input(s): APPERANCEUR    Imaging: Ct Abdomen Pelvis Wo Contrast  Result Date: 05/10/2019 CLINICAL DATA:  Nausea, vomiting EXAM: CT ABDOMEN AND PELVIS WITHOUT CONTRAST TECHNIQUE: Multidetector CT imaging of the abdomen and pelvis was performed following the standard protocol without IV contrast. COMPARISON:  CT chest abdomen pelvis, 05/17/2017 FINDINGS:  Examination is generally limited by lack of intravenous contrast, breath motion artifact, and streak artifact from patient arm positioning. Lower chest: Small bilateral pleural effusions with associated heterogeneous and ground-glass airspace opacity. Stable, benign small pulmonary nodule of the left lung base. Cardiomegaly. Hepatobiliary: No solid liver abnormality is seen. No gallstones, gallbladder wall thickening, or biliary dilatation. Pancreas: Unremarkable. No pancreatic ductal dilatation or surrounding inflammatory changes. Spleen: Normal in size without significant abnormality. Adrenals/Urinary Tract: Adrenal glands are unremarkable. Unchanged perinephric fat stranding. Kidneys are otherwise normal, without renal calculi, solid lesion, or hydronephrosis. Bladder is unremarkable. Urinary bladder is distended. Stomach/Bowel: Stomach is within normal limits. Appendix appears normal. No evidence of bowel wall thickening, distention, or inflammatory changes. Moderate burden of stool in the colon. Vascular/Lymphatic: Aortic atherosclerosis. No enlarged abdominal or pelvic lymph nodes. Reproductive: No mass or other significant abnormality. Other: Anasarca.  No abdominopelvic ascites. Musculoskeletal: No acute or significant osseous findings. Multiple nonacute fractures of the ribs. IMPRESSION: 1. Examination is generally limited by lack of intravenous contrast, breath motion artifact, and streak artifact from patient arm positioning. Within this limitation, no acute CT noncontrast findings of the abdomen or pelvis to explain vomiting. 2.  Distended urinary bladder.  Correlate for urinary retention. 3. Small bilateral pleural effusions with associated heterogeneous and ground-glass airspace opacity. 4.  Anasarca. 5.  Other chronic and incidental findings as  detailed above. Electronically Signed   By: Eddie Candle M.D.   On: 05/10/2019 11:13   Ct Head Wo Contrast  Result Date: 05/09/2019 CLINICAL DATA:  Head  trauma.  Mechanical fall with right eye injury. EXAM: CT HEAD WITHOUT CONTRAST CT MAXILLOFACIAL WITHOUT CONTRAST CT CERVICAL SPINE WITHOUT CONTRAST TECHNIQUE: Multidetector CT imaging of the head, cervical spine, and maxillofacial structures were performed using the standard protocol without intravenous contrast. Multiplanar CT image reconstructions of the cervical spine and maxillofacial structures were also generated. COMPARISON:  Head CT 04/24/2019 FINDINGS: CT HEAD FINDINGS Brain: There is no mass, hemorrhage or extra-axial collection. There is generalized atrophy without lobar predilection. Areas of hypoattenuation of the deep gray nuclei and confluent periventricular white matter hypodensity, consistent with chronic small vessel disease. Vascular: No abnormal hyperdensity of the major intracranial arteries or dural venous sinuses. No intracranial atherosclerosis. Skull: Intermediate sized right periorbital hematoma. No skull fracture. CT MAXILLOFACIAL FINDINGS Osseous: --Complex facial fracture types: No LeFort, zygomaticomaxillary complex or nasoorbitoethmoidal fracture. --Simple fracture types: None. --Mandible: No fracture or dislocation. 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 There is a right periorbital hematoma. The right globe is normal. Status post lens replacement. Sinuses: No fluid levels or advanced mucosal thickening. Soft tissues: Right periorbital hematoma CT CERVICAL SPINE FINDINGS Alignment: No static subluxation. Facets are aligned. Occipital condyles and the lateral masses of C1-C2 are aligned. Skull base and vertebrae: No acute fracture. Soft tissues and spinal canal: No prevertebral fluid or swelling. No visible canal hematoma. Disc levels: C6-7 disc space narrowing and uncovertebral hypertrophy. Upper chest: No pneumothorax, pulmonary nodule or pleural effusion. Other: Normal visualized paraspinal cervical soft tissues. IMPRESSION: 1. No acute intracranial abnormality. 2. No  fracture or static subluxation of the cervical spine. 3. Large right periorbital hematoma without orbital injury or skull fracture. Electronically Signed   By: Ulyses Jarred M.D.   On: 05/09/2019 19:48   Ct Cervical Spine Wo Contrast  Result Date: 05/09/2019 CLINICAL DATA:  Head trauma.  Mechanical fall with right eye injury. EXAM: CT HEAD WITHOUT CONTRAST CT MAXILLOFACIAL WITHOUT CONTRAST CT CERVICAL SPINE WITHOUT CONTRAST TECHNIQUE: Multidetector CT imaging of the head, cervical spine, and maxillofacial structures were performed using the standard protocol without intravenous contrast. Multiplanar CT image reconstructions of the cervical spine and maxillofacial structures were also generated. COMPARISON:  Head CT 04/24/2019 FINDINGS: CT HEAD FINDINGS Brain: There is no mass, hemorrhage or extra-axial collection. There is generalized atrophy without lobar predilection. Areas of hypoattenuation of the deep gray nuclei and confluent periventricular white matter hypodensity, consistent with chronic small vessel disease. Vascular: No abnormal hyperdensity of the major intracranial arteries or dural venous sinuses. No intracranial atherosclerosis. Skull: Intermediate sized right periorbital hematoma. No skull fracture. CT MAXILLOFACIAL FINDINGS Osseous: --Complex facial fracture types: No LeFort, zygomaticomaxillary complex or nasoorbitoethmoidal fracture. --Simple fracture types: None. --Mandible: No fracture or dislocation. 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 There is a right periorbital hematoma. The right globe is normal. Status post lens replacement. Sinuses: No fluid levels or advanced mucosal thickening. Soft tissues: Right periorbital hematoma CT CERVICAL SPINE FINDINGS Alignment: No static subluxation. Facets are aligned. Occipital condyles and the lateral masses of C1-C2 are aligned. Skull base and vertebrae: No acute fracture. Soft tissues and spinal canal: No prevertebral fluid or swelling.  No visible canal hematoma. Disc levels: C6-7 disc space narrowing and uncovertebral hypertrophy. Upper chest: No pneumothorax, pulmonary nodule or pleural effusion. Other: Normal visualized paraspinal cervical soft tissues. IMPRESSION: 1. No acute intracranial  abnormality. 2. No fracture or static subluxation of the cervical spine. 3. Large right periorbital hematoma without orbital injury or skull fracture. Electronically Signed   By: Ulyses Jarred M.D.   On: 05/09/2019 19:48   Ct Maxillofacial Wo Contrast  Result Date: 05/09/2019 CLINICAL DATA:  Head trauma.  Mechanical fall with right eye injury. EXAM: CT HEAD WITHOUT CONTRAST CT MAXILLOFACIAL WITHOUT CONTRAST CT CERVICAL SPINE WITHOUT CONTRAST TECHNIQUE: Multidetector CT imaging of the head, cervical spine, and maxillofacial structures were performed using the standard protocol without intravenous contrast. Multiplanar CT image reconstructions of the cervical spine and maxillofacial structures were also generated. COMPARISON:  Head CT 04/24/2019 FINDINGS: CT HEAD FINDINGS Brain: There is no mass, hemorrhage or extra-axial collection. There is generalized atrophy without lobar predilection. Areas of hypoattenuation of the deep gray nuclei and confluent periventricular white matter hypodensity, consistent with chronic small vessel disease. Vascular: No abnormal hyperdensity of the major intracranial arteries or dural venous sinuses. No intracranial atherosclerosis. Skull: Intermediate sized right periorbital hematoma. No skull fracture. CT MAXILLOFACIAL FINDINGS Osseous: --Complex facial fracture types: No LeFort, zygomaticomaxillary complex or nasoorbitoethmoidal fracture. --Simple fracture types: None. --Mandible: No fracture or dislocation. 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 There is a right periorbital hematoma. The right globe is normal. Status post lens replacement. Sinuses: No fluid levels or advanced mucosal thickening. Soft tissues:  Right periorbital hematoma CT CERVICAL SPINE FINDINGS Alignment: No static subluxation. Facets are aligned. Occipital condyles and the lateral masses of C1-C2 are aligned. Skull base and vertebrae: No acute fracture. Soft tissues and spinal canal: No prevertebral fluid or swelling. No visible canal hematoma. Disc levels: C6-7 disc space narrowing and uncovertebral hypertrophy. Upper chest: No pneumothorax, pulmonary nodule or pleural effusion. Other: Normal visualized paraspinal cervical soft tissues. IMPRESSION: 1. No acute intracranial abnormality. 2. No fracture or static subluxation of the cervical spine. 3. Large right periorbital hematoma without orbital injury or skull fracture. Electronically Signed   By: Ulyses Jarred M.D.   On: 05/09/2019 19:48     Medications:   . sodium chloride    . sodium chloride 50 mL/hr at 05/10/19 0300   . amLODipine  10 mg Oral Daily  . aspirin EC  81 mg Oral Daily  . atorvastatin  40 mg Oral QHS  . citalopram  20 mg Oral Daily  . clopidogrel  75 mg Oral Daily  . divalproex  500 mg Oral BID  . glipiZIDE  2.5 mg Oral BID  . insulin aspart  0-15 Units Subcutaneous TID WC  . insulin aspart  0-5 Units Subcutaneous QHS  . [START ON 05/11/2019] levothyroxine  125 mcg Oral QAC breakfast  . lipase/protease/amylase  12,000 Units Oral TID WC  . metoprolol tartrate  25 mg Oral BID  . pantoprazole (PROTONIX) IV  40 mg Intravenous Q12H  . sodium bicarbonate  650 mg Oral BID  . tamsulosin  0.4 mg Oral Daily   acetaminophen **OR** acetaminophen, ondansetron **OR** ondansetron (ZOFRAN) IV, polyethylene glycol  Assessment/ Plan:  [name] is a 68 y.o. black male withLeft eye prosthesis, diabetes mellitus type 2, hypertension, syncope, and chronic kidney disease stage IV followed by [facility], who was admitted to [facility] on 05/09/2019 for Fall, initial encounter [W19.XXXA] Anemia, unspecified type [D64.9] Acute renal failure superimposed on chronic kidney  disease, unspecified CKD stage, unspecified acute renal failure type ([place]) [N17.9, N18.9]  1. Acute renal failure on Chronic kidney disease stage IV with hyperkalemia and metabolic acidosis  Follows with  [facility].  History of acute renal failure requiring hemodialylsis for several months. Has been off dialysis since 10/18/18.  Chronic kidney disease secondary to diabetic nephropathy and hypertension Acute renal failure secondary to prerenal azotemia and acute blood loss.  No indication for dialysis - Continue IV fluids - Continue PO bicarbonate. - repeat urinalysis   2. Hypotension: with systolic and diastolic congestive heart failure - Continue amlodipine - Holding furosemide.   3. Anemia with chronic kidney disease/renal failure: with acute blood loss anemia. Status post PRBC transfusion on 8/3.    LOS: 1 Cameron Adams 8/4/20203:30 PM

## 2019-05-10 NOTE — Progress Notes (Signed)
[facility] [place] NAME: Cameron Adams    MR#:  [id number]  DATE OF BIRTH:  1951/01/16  SUBJECTIVE:  CHIEF COMPLAINT:   Chief Complaint  Patient presents with   Fall  He is altered, has vomitus all over his bed.  REVIEW OF SYSTEMS:  ROS unable to obtain due to his mental status  DRUG ALLERGIES:   Allergies  Allergen Reactions   Gabapentin     Other reaction(s): Other (See Comments) Trouble sleeping    VITALS:  Blood pressure (!) 188/81, pulse 65, temperature 98.1 F (36.7 C), resp. rate 20, height 5\' 11"  (1.803 m), weight 68 kg, SpO2 99 %. PHYSICAL EXAMINATION:  Physical Exam HENT:     Head: Normocephalic and atraumatic.  Eyes:     Conjunctiva/sclera: Conjunctivae normal.     Pupils: Pupils are equal, round, and reactive to light.  Neck:     Musculoskeletal: Normal range of motion and neck supple.     Thyroid: No thyromegaly.     Trachea: No tracheal deviation.  Cardiovascular:     Rate and Rhythm: Normal rate and regular rhythm.     Heart sounds: Normal heart sounds.  Pulmonary:     Effort: Pulmonary effort is normal. No respiratory distress.     Breath sounds: Normal breath sounds. No wheezing.  Chest:     Chest wall: No tenderness.  Abdominal:     General: Bowel sounds are normal. There is no distension.     Palpations: Abdomen is soft.     Tenderness: There is no abdominal tenderness.  Musculoskeletal: Normal range of motion.  Skin:    General: Skin is warm and dry.     Findings: No rash.  Neurological:     Mental Status: He is disoriented and confused.     Cranial Nerves: No cranial nerve deficit.  Psychiatric:     Comments: Unable to evaluate due to his mental status    LABORATORY PANEL:  Male CBC Recent Labs  Lab 05/10/19 0405  WBC 5.3  HGB 8.6*  HCT 26.9*  PLT 137*    ------------------------------------------------------------------------------------------------------------------ Chemistries  Recent Labs  Lab 05/10/19 0405  NA 141  K 5.0  CL 115*  CO2 22  GLUCOSE 81  BUN 53*  CREATININE 3.95*  CALCIUM 7.8*   RADIOLOGY:  Ct Abdomen Pelvis Wo Contrast  Result Date: 05/10/2019 CLINICAL DATA:  Nausea, vomiting EXAM: CT ABDOMEN AND PELVIS WITHOUT CONTRAST TECHNIQUE: Multidetector CT imaging of the abdomen and pelvis was performed following the standard protocol without IV contrast. COMPARISON:  CT chest abdomen pelvis, 05/17/2017 FINDINGS: Examination is generally limited by lack of intravenous contrast, breath motion artifact, and streak artifact from patient arm positioning. Lower chest: Small bilateral pleural effusions with associated heterogeneous and ground-glass airspace opacity. Stable, benign small pulmonary nodule of the left lung base. Cardiomegaly. Hepatobiliary: No solid liver abnormality is seen. No gallstones, gallbladder wall thickening, or biliary dilatation. Pancreas: Unremarkable. No pancreatic ductal dilatation or surrounding inflammatory changes. Spleen: Normal in size without significant abnormality. Adrenals/Urinary Tract: Adrenal glands are unremarkable. Unchanged perinephric fat stranding. Kidneys are otherwise normal, without renal calculi, solid lesion, or hydronephrosis. Bladder is unremarkable. Urinary bladder is distended. Stomach/Bowel: Stomach is within normal limits. Appendix appears normal. No evidence of bowel wall thickening, distention, or inflammatory changes. Moderate burden of stool in the colon. Vascular/Lymphatic: Aortic atherosclerosis. No enlarged abdominal or pelvic lymph nodes. Reproductive: No mass  or other significant abnormality. Other: Anasarca.  No abdominopelvic ascites. Musculoskeletal: No acute or significant osseous findings. Multiple nonacute fractures of the ribs. IMPRESSION: 1. Examination is generally  limited by lack of intravenous contrast, breath motion artifact, and streak artifact from patient arm positioning. Within this limitation, no acute CT noncontrast findings of the abdomen or pelvis to explain vomiting. 2.  Distended urinary bladder.  Correlate for urinary retention. 3. Small bilateral pleural effusions with associated heterogeneous and ground-glass airspace opacity. 4.  Anasarca. 5.  Other chronic and incidental findings as detailed above. Electronically Signed   By: Eddie Candle M.D.   On: 05/10/2019 11:13   Ct Head Wo Contrast  Result Date: 05/09/2019 CLINICAL DATA:  Head trauma.  Mechanical fall with right eye injury. EXAM: CT HEAD WITHOUT CONTRAST CT MAXILLOFACIAL WITHOUT CONTRAST CT CERVICAL SPINE WITHOUT CONTRAST TECHNIQUE: Multidetector CT imaging of the head, cervical spine, and maxillofacial structures were performed using the standard protocol without intravenous contrast. Multiplanar CT image reconstructions of the cervical spine and maxillofacial structures were also generated. COMPARISON:  Head CT 04/24/2019 FINDINGS: CT HEAD FINDINGS Brain: There is no mass, hemorrhage or extra-axial collection. There is generalized atrophy without lobar predilection. Areas of hypoattenuation of the deep gray nuclei and confluent periventricular white matter hypodensity, consistent with chronic small vessel disease. Vascular: No abnormal hyperdensity of the major intracranial arteries or dural venous sinuses. No intracranial atherosclerosis. Skull: Intermediate sized right periorbital hematoma. No skull fracture. CT MAXILLOFACIAL FINDINGS Osseous: --Complex facial fracture types: No LeFort, zygomaticomaxillary complex or nasoorbitoethmoidal fracture. --Simple fracture types: None. --Mandible: No fracture or dislocation. 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 There is a right periorbital hematoma. The right globe is normal. Status post lens replacement. Sinuses: No fluid levels or advanced  mucosal thickening. Soft tissues: Right periorbital hematoma CT CERVICAL SPINE FINDINGS Alignment: No static subluxation. Facets are aligned. Occipital condyles and the lateral masses of C1-C2 are aligned. Skull base and vertebrae: No acute fracture. Soft tissues and spinal canal: No prevertebral fluid or swelling. No visible canal hematoma. Disc levels: C6-7 disc space narrowing and uncovertebral hypertrophy. Upper chest: No pneumothorax, pulmonary nodule or pleural effusion. Other: Normal visualized paraspinal cervical soft tissues. IMPRESSION: 1. No acute intracranial abnormality. 2. No fracture or static subluxation of the cervical spine. 3. Large right periorbital hematoma without orbital injury or skull fracture. Electronically Signed   By: Ulyses Jarred M.D.   On: 05/09/2019 19:48   Ct Cervical Spine Wo Contrast  Result Date: 05/09/2019 CLINICAL DATA:  Head trauma.  Mechanical fall with right eye injury. EXAM: CT HEAD WITHOUT CONTRAST CT MAXILLOFACIAL WITHOUT CONTRAST CT CERVICAL SPINE WITHOUT CONTRAST TECHNIQUE: Multidetector CT imaging of the head, cervical spine, and maxillofacial structures were performed using the standard protocol without intravenous contrast. Multiplanar CT image reconstructions of the cervical spine and maxillofacial structures were also generated. COMPARISON:  Head CT 04/24/2019 FINDINGS: CT HEAD FINDINGS Brain: There is no mass, hemorrhage or extra-axial collection. There is generalized atrophy without lobar predilection. Areas of hypoattenuation of the deep gray nuclei and confluent periventricular white matter hypodensity, consistent with chronic small vessel disease. Vascular: No abnormal hyperdensity of the major intracranial arteries or dural venous sinuses. No intracranial atherosclerosis. Skull: Intermediate sized right periorbital hematoma. No skull fracture. CT MAXILLOFACIAL FINDINGS Osseous: --Complex facial fracture types: No LeFort, zygomaticomaxillary complex or  nasoorbitoethmoidal fracture. --Simple fracture types: None. --Mandible: No fracture or dislocation. 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 There is a right periorbital hematoma. The  right globe is normal. Status post lens replacement. Sinuses: No fluid levels or advanced mucosal thickening. Soft tissues: Right periorbital hematoma CT CERVICAL SPINE FINDINGS Alignment: No static subluxation. Facets are aligned. Occipital condyles and the lateral masses of C1-C2 are aligned. Skull base and vertebrae: No acute fracture. Soft tissues and spinal canal: No prevertebral fluid or swelling. No visible canal hematoma. Disc levels: C6-7 disc space narrowing and uncovertebral hypertrophy. Upper chest: No pneumothorax, pulmonary nodule or pleural effusion. Other: Normal visualized paraspinal cervical soft tissues. IMPRESSION: 1. No acute intracranial abnormality. 2. No fracture or static subluxation of the cervical spine. 3. Large right periorbital hematoma without orbital injury or skull fracture. Electronically Signed   By: Ulyses Jarred M.D.   On: 05/09/2019 19:48   Ct Maxillofacial Wo Contrast  Result Date: 05/09/2019 CLINICAL DATA:  Head trauma.  Mechanical fall with right eye injury. EXAM: CT HEAD WITHOUT CONTRAST CT MAXILLOFACIAL WITHOUT CONTRAST CT CERVICAL SPINE WITHOUT CONTRAST TECHNIQUE: Multidetector CT imaging of the head, cervical spine, and maxillofacial structures were performed using the standard protocol without intravenous contrast. Multiplanar CT image reconstructions of the cervical spine and maxillofacial structures were also generated. COMPARISON:  Head CT 04/24/2019 FINDINGS: CT HEAD FINDINGS Brain: There is no mass, hemorrhage or extra-axial collection. There is generalized atrophy without lobar predilection. Areas of hypoattenuation of the deep gray nuclei and confluent periventricular white matter hypodensity, consistent with chronic small vessel disease. Vascular: No abnormal  hyperdensity of the major intracranial arteries or dural venous sinuses. No intracranial atherosclerosis. Skull: Intermediate sized right periorbital hematoma. No skull fracture. CT MAXILLOFACIAL FINDINGS Osseous: --Complex facial fracture types: No LeFort, zygomaticomaxillary complex or nasoorbitoethmoidal fracture. --Simple fracture types: None. --Mandible: No fracture or dislocation. 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 There is a right periorbital hematoma. The right globe is normal. Status post lens replacement. Sinuses: No fluid levels or advanced mucosal thickening. Soft tissues: Right periorbital hematoma CT CERVICAL SPINE FINDINGS Alignment: No static subluxation. Facets are aligned. Occipital condyles and the lateral masses of C1-C2 are aligned. Skull base and vertebrae: No acute fracture. Soft tissues and spinal canal: No prevertebral fluid or swelling. No visible canal hematoma. Disc levels: C6-7 disc space narrowing and uncovertebral hypertrophy. Upper chest: No pneumothorax, pulmonary nodule or pleural effusion. Other: Normal visualized paraspinal cervical soft tissues. IMPRESSION: 1. No acute intracranial abnormality. 2. No fracture or static subluxation of the cervical spine. 3. Large right periorbital hematoma without orbital injury or skull fracture. Electronically Signed   By: Ulyses Jarred M.D.   On: 05/09/2019 19:48   ASSESSMENT AND PLAN:  68 y.o.malewith a known history of diabetes mellitus, CKD, diastolic CHF, history of seizure, hypothyroidism, and history of urinary retention admitted for AMS. AMS most likely multifactoriel includingi metabolic/electrolytes abnties, infections. Pateitn has a hs/o of sz on depakote.  1.  Acute blood loss anemia -  Status post 1 unit packed red blood cells in the ER on arrival.  We will continue every 6 hour hemoglobin and hematocrit  2.  Acute on chronic renal failure -  Consult nephrology  3.  Nausea/vomiting -We will obtain CT  abdomen/pelvis -shows urinary retention -We will obtain bladder scan and consider Foley if need  4.  Diabetes mellitus - Moderate sliding scale insulin  5.  Acute metabolic encephalopathy - Neuro protectives measures including normothermai, normoglycemia, correct electrolytes/metabolic abnlities, treat any infection if present - Continue depakote 500 mg bid.  Checking ammonia level Freq neurocheck and if deteriotates obtain head ct stat. (  given fact he is on plavix/asa)   All the records are reviewed and case discussed with Care Management/Social Worker. Management plans discussed with the patient, family (left message to his daughter Elwin Sleight) and they are in agreement.  CODE STATUS: Full Code  TOTAL TIME TAKING CARE OF THIS PATIENT: 35 minutes.   More than 50% of the time was spent in counseling/coordination of care: YES  POSSIBLE D/C IN 2-3 DAYS, DEPENDING ON CLINICAL CONDITION.   Max Sane M.D on 05/10/2019 at 2:54 PM  Between 7am to 6pm - Pager - [phone]  After 6pm go to [email or web address] - Proofreader  [facility]  Office  [phone]  CC: Primary care physician; [name], MD  Note: This dictation was prepared with Dragon dictation along with smaller phrase technology. Any transcriptional errors that result from this process are unintentional.

## 2019-05-10 NOTE — NC FL2 (Signed)
[place] LEVEL OF CARE SCREENING TOOL     IDENTIFICATION  Patient Name: Cameron Adams Birthdate: 1951-03-04 Sex: male Admission Date (Current Location): 05/09/2019  [facility] and [place] Number:  Engineering geologist and Address:         Provider Number: [id number]  Attending Physician Name and Address:  Max Sane, MD  Relative Name and Phone Number:       Current Level of Care: Hospital Recommended Level of Care: [place] Prior Approval Number:    Date Approved/Denied:   PASRR Number: [id number]  Discharge Plan: SNF    Current Diagnoses: Patient Active Problem List   Diagnosis Date Noted  . Dehydration 05/09/2019  . Confusion   . Visual hallucination   . Altered mental status 04/24/2019  . Syncope and collapse 01/12/2019  . Pancreatic insufficiency 12/28/2018  . Chronic systolic CHF (congestive heart failure) ([place]) 12/13/2018  . History of TIA (transient ischemic attack) 12/13/2018  . Weakness   . AKI (acute kidney injury) ([place]) 12/04/2018  . Arm wound, right, initial encounter 07/21/2018  . Wound infection 07/21/2018  . Orthostatic hypotension dysautonomic syndrome ([place]) 07/07/2018  . Acute renal failure superimposed on stage 3 chronic kidney disease ([place]) 12/09/2017  . Dizziness 06/22/2017  . Hyperkalemia 06/16/2017  . Esophageal dysmotilities   . Dysphagia   . Rib fractures 05/17/2017  . Hemothorax, right 05/17/2017  . Recurrent major depressive disorder, in partial remission ([place]) 05/13/2017  . Pure hypercholesterolemia 05/13/2017  . IDDM (insulin dependent diabetes mellitus) ([place]) 05/13/2017  . Hypothyroidism (acquired) 05/13/2017  . GAD (generalized anxiety disorder) 05/13/2017  . CKD (chronic kidney disease) stage 3, GFR 30-59 ml/min ([facility]) 05/13/2017  . Toe gangrene ([place]) 09/16/2016  . PVD (peripheral vascular disease) ([place]) 09/16/2016  . Uncontrolled diabetes mellitus ([place]) 09/16/2016  . Hyponatremia 09/16/2016   . Hypokalemia 09/16/2016  . Essential hypertension, malignant 09/16/2016  . Abnormal angiogram 09/16/2016  . Gangrene ([place]) 09/13/2016  . Pruritus 03/26/2016  . Orthostatic hypotension 03/26/2016  . 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 Alcohol abuse 03/26/2016  . Hyperglycemia 03/23/2016  . Passive suicidal ideations 09/16/2014  . MDD (major depressive disorder) 09/16/2014  . Dyspnea 11/09/2013  . CAP (community acquired pneumonia) 10/31/2013  . Chest pain 10/31/2013  . Chronic respiratory failure with hypoxia ([place]) 10/31/2013  . 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 Diabetic retinopathy ([place]) 06/16/2013  . Gynecomastia 04/18/2013    Orientation RESPIRATION BLADDER Height & Weight     (Disoriented x 4)  Normal Incontinent, Indwelling catheter Weight: 150 lb (68 kg) Height:  5\' 11"  (180.3 cm)  BEHAVIORAL SYMPTOMS/MOOD NEUROLOGICAL BOWEL NUTRITION STATUS  Other (Comment)(Was agitated and uncooperative last night (8/3).) (None) Continent Diet(Heart healthy/carb modified.)  AMBULATORY STATUS COMMUNICATION OF NEEDS Skin   Extensive Assist Verbally Skin abrasions                       Personal Care Assistance Level of Assistance              Functional Limitations Info  Sight, Hearing, Speech Sight Info: Adequate(Has one glass eye) Hearing Info: Adequate Speech Info: Adequate    SPECIAL CARE FACTORS FREQUENCY  PT (By licensed PT)     PT Frequency: 5 x week              Contractures Contractures Info: Not present    Additional Factors Info  Code Status, Allergies, Psychotropic Code Status Info: Full  code Allergies Info: Gabapentin Psychotropic Info: Celexa 20 mg PO daily, Depakote ER 500 mg PO BID.         Current Medications (05/10/2019):  This is the current hospital active medication list Current Facility-Administered Medications  Medication Dose Route Frequency Provider Last Rate Last Dose  . 0.9 %  sodium  chloride infusion  10 mL/hr Intravenous Once Marjean Donna E, MD      . 0.9 %  sodium chloride infusion   Intravenous Continuous Seals, Theo Dills, NP 50 mL/hr at 05/10/19 0300    . acetaminophen (TYLENOL) tablet 650 mg  650 mg Oral Q6H PRN Seals, Theo Dills, NP       Or  . acetaminophen (TYLENOL) suppository 650 mg  650 mg Rectal Q6H PRN Seals, Theo Dills, NP      . amLODipine (NORVASC) tablet 10 mg  10 mg Oral Daily Seals, Theo Dills, NP   Stopped at 05/10/19 1220  . aspirin EC tablet 81 mg  81 mg Oral Daily Seals, Theo Dills, NP   Stopped at 05/10/19 1220  . atorvastatin (LIPITOR) tablet 40 mg  40 mg Oral QHS Seals, Angela H, NP      . citalopram (CELEXA) tablet 20 mg  20 mg Oral Daily Seals, Theo Dills, NP   Stopped at 05/10/19 1220  . clopidogrel (PLAVIX) tablet 75 mg  75 mg Oral Daily Seals, Theo Dills, NP   Stopped at 05/10/19 1220  . divalproex (DEPAKOTE ER) 24 hr tablet 500 mg  500 mg Oral BID Mayer Camel, NP   Stopped at 05/10/19 1220  . glipiZIDE (GLUCOTROL) tablet 2.5 mg  2.5 mg Oral BID Mayer Camel, NP   Stopped at 05/10/19 1221  . insulin aspart (novoLOG) injection 0-15 Units  0-15 Units Subcutaneous TID WC Seals, Angela H, NP      . insulin aspart (novoLOG) injection 0-5 Units  0-5 Units Subcutaneous QHS Seals, Theo Dills, NP      . Derrill Memo ON 05/11/2019] levothyroxine (SYNTHROID) tablet 125 mcg  125 mcg Oral QAC breakfast Max Sane, MD      . lipase/protease/amylase (CREON) capsule 12,000 Units  12,000 Units Oral TID WC Seals, Theo Dills, NP   Stopped at 05/10/19 1218  . metoprolol tartrate (LOPRESSOR) tablet 25 mg  25 mg Oral BID Mayer Camel, NP   Stopped at 05/10/19 1218  . ondansetron (ZOFRAN) tablet 4 mg  4 mg Oral Q6H PRN Seals, Theo Dills, NP       Or  . ondansetron (ZOFRAN) injection 4 mg  4 mg Intravenous Q6H PRN Seals, Theo Dills, NP   4 mg at 05/10/19 1229  . pantoprazole (PROTONIX) injection 40 mg  40 mg Intravenous Q12H Seals, Angela H, NP      . polyethylene glycol (MIRALAX /  GLYCOLAX) packet 17 g  17 g Oral Daily PRN Seals, Levada Dy H, NP      . sodium bicarbonate tablet 650 mg  650 mg Oral BID Mayer Camel, NP   Stopped at 05/10/19 1219  . tamsulosin (FLOMAX) capsule 0.4 mg  0.4 mg Oral Daily Seals, Theo Dills, NP   Stopped at 05/10/19 1219     Discharge Medications: Please see discharge summary for a list of discharge medications.  Relevant Imaging Results:  Relevant Lab Results:   Additional Information SS#: [id number]  Candie Chroman, LCSW

## 2019-05-10 NOTE — Consult Note (Signed)
[facility] Face-to-Face Psychiatry Consult   Reason for Consult:  AMS Referring Physician:  Dr. Manuella Ghazi Patient Identification: Cameron Adams MRN:  [id number] Principal Diagnosis: <principal problem not specified> Diagnosis:  Active Problems:   Dehydration   Total Time spent with patient: 1 hour  Subjective:   Cameron Adams is a 68 y.o. male patient reports that he came to the hospital due to falling at home and hitting his right eye. He states that he is not suicidal or homicidal and denies any hallucinations. He reports that he lives with his daughter, Cameron Adams. He states that he is doing ok today.   Daughter, Cameron Adams, was contacted for collateral. She reports that she has concerns for medical issues, but not mental health. She states that he has depression and has been complaint with his citalopram. She states that he had presented this way previously and it was due to a UTI.  HPI:  Per Gardiner Barefoot NP: 68 y.o. male with a known history of diabetes mellitus, CKD, diastolic CHF, history of seizure, hypothyroidism, and history of urinary retention.  Patient presented to the emergency room after sustaining a fall while attempting to get into his wheelchair.  He recalls hitting his right eye on a piece of metal however is unsure what this was.  There is edema to his right eye with tenderness.  No lacerations or wounds noted.  He denies loss of consciousness he denies dizziness or headache.  He denies blurred vision however vision is diminished with edema.  Patient has a left eye prosthesis.  He is on anticoagulant with aspirin and Plavix.  Patient is known to have a decreased hemoglobin from 9 to 7.6 on arrival when compared to a 2-week period.  He denies hematemesis, hematochezia, or melena.  He denies abdominal pain.  He denies diarrhea, nausea, vomiting.  He denies recent fevers or chills.  He denies chest pain or shortness of breath.  Patient has a history of chronic renal failure with baseline  labs including BUN of 30 and creatinine 2.27 on 04/30/2019.  Labs completed in the emergency room demonstrate BUN of 54 and creatinine 3.96 with hemoglobin 7.6 and hematocrit 24.1 and platelet count 141.  CT face is negative for acute findings or fractures.  Patient is seen by this provider face-to-face today. He is l;ying in bed and speaks when spoken to. He is unable to make eye contact due to a l;eft prosthetic eye and the right eye is swollen from his fall. His speech is slow and slurred and difficult to understand, but his answer are correct and was oriented to place, person, time, and that he lived with his daughter. There were reports of him being very confused and needing constant reminding and redirection, but not being witnessed now. I had the RN go with me to ensure of the patient's status and he reported that the patient is answering questions better than this morning. Will continue following this patent to see if there are worsening symptoms or improvements. He does not meet inpatient criteria at this time.   Past Psychiatric History: Depression Risk to Self:   Risk to Others:   Prior Inpatient Therapy:   Prior Outpatient Therapy:    Past Medical History:  Past Medical History:  Diagnosis Date  . Anemia of chronic disease   . Blindness of left eye   . Chronic kidney disease, stage III (moderate) (HCC)   . Diabetes ([place])   . Diarrhea   . Diastolic dysfunction  a. 06/2017 Echo: EF 55-60%, no rwma, Gr2 DD, Asc Ao 3.9cm, nl RV fxn, nl PASP.  Marland Kitchen Hypertension   . Hypothyroidism   . Monoclonal gammopathy of unknown significance (MGUS)    a. prev seen @ [facility].  Lost to f/u - never had BM bx.  . Orthostatic hypotension   . Syncope and collapse     Past Surgical History:  Procedure Laterality Date  . 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 PERIPHERAL VASCULAR  CATHETERIZATION N/A 09/15/2016   Procedure: Lower Extremity Angiography;  Surgeon: Algernon Huxley, MD;  Location: [facility];  Service: Cardiovascular;  Laterality: N/A;  . THYROID SURGERY     Family History:  Family History  Problem Relation Age of Onset  . Kidney failure Mother   . Heart disease Father   . Kidney failure Father   . Heart disease Brother    Family Psychiatric  History: None reported Social History:  Social History   Substance and Sexual Activity  Alcohol Use Not Currently   Comment: previously drank heavily but not in many years     Social History   Substance and Sexual Activity  Drug Use No    Social History   Socioeconomic History  . Marital status: Single    Spouse name: Not on file  . Number of children: Not on file  . Years of education: Not on file  . Highest education level: Not on file  Occupational History  . Occupation: Retired  Scientific laboratory technician  . Financial resource strain: Hard  . Food insecurity    Worry: Not on file    Inability: Not on file  . Transportation needs    Medical: No    Non-medical: No  Tobacco Use  . Smoking status: Former Smoker    Packs/day: 0.10    Years: 25.00    Pack years: 2.50    Types: Cigarettes    Quit date: 10/31/1978    Years since quitting: 40.5  . Smokeless tobacco: Never Used  . Tobacco comment: smoked 2-3 cigarettes/day  Substance and Sexual Activity  . Alcohol use: Not Currently    Comment: previously drank heavily but not in many years  . Drug use: No  . Sexual activity: Not on file  Lifestyle  . Physical activity    Days per week: 3 days    Minutes per session: 20 min  . Stress: Not on file  Relationships  . Social Herbalist on phone: Not on file    Gets together: Not on file    Attends religious service: Not on file    Active member of club or organization: Not on file    Attends meetings of clubs or organizations: Not on file    Relationship status: Not on file  Other  Topics Concern  . Not on file  Social History Narrative   Lives in [place] by himself.  Says he is very active at home but does not routinely exercise.  Usual diet is poor - some days he might only drink a few cups of coffee.   Additional Social History:    Allergies:   Allergies  Allergen Reactions  . Gabapentin     Other reaction(s): Other (See Comments) Trouble sleeping     Labs:  Results for orders placed or performed during the hospital encounter of 05/09/19 (from the  past 48 hour(s))  CBC with Differential     Status: Abnormal   Collection Time: 05/09/19  7:07 PM  Result Value Ref Range   WBC 4.7 4.0 - 10.5 K/uL   RBC 2.63 (L) 4.22 - 5.81 MIL/uL   Hemoglobin 7.6 (L) 13.0 - 17.0 g/dL   HCT 24.1 (L) 39.0 - 52.0 %   MCV 91.6 80.0 - 100.0 fL   MCH 28.9 26.0 - 34.0 pg   MCHC 31.5 30.0 - 36.0 g/dL   RDW 14.0 11.5 - 15.5 %   Platelets 141 (L) 150 - 400 K/uL   nRBC 0.0 0.0 - 0.2 %   Neutrophils Relative % 63 %   Neutro Abs 2.9 1.7 - 7.7 K/uL   Lymphocytes Relative 23 %   Lymphs Abs 1.1 0.7 - 4.0 K/uL   Monocytes Relative 7 %   Monocytes Absolute 0.4 0.1 - 1.0 K/uL   Eosinophils Relative 6 %   Eosinophils Absolute 0.3 0.0 - 0.5 K/uL   Basophils Relative 0 %   Basophils Absolute 0.0 0.0 - 0.1 K/uL   Immature Granulocytes 1 %   Abs Immature Granulocytes 0.03 0.00 - 0.07 K/uL    Comment: Performed at [facility], [address] [place], [place] [address]  Basic metabolic panel     Status: Abnormal   Collection Time: 05/09/19  7:07 PM  Result Value Ref Range   Sodium 135 135 - 145 mmol/L   Potassium 5.0 3.5 - 5.1 mmol/L   Chloride 111 98 - 111 mmol/L   CO2 19 (L) 22 - 32 mmol/L   Glucose, Bld 165 (H) 70 - 99 mg/dL   BUN 54 (H) 8 - 23 mg/dL   Creatinine, Ser 3.96 (H) 0.61 - 1.24 mg/dL   Calcium 7.6 (L) 8.9 - 10.3 mg/dL   GFR calc non Af Amer 15 (L) >60 mL/min   GFR calc Af Amer 17 (L) >60 mL/min   Anion gap 5 5 - 15    Comment: Performed at [facility], [address] [place], [place] [address]  Protime-INR     Status: Abnormal   Collection Time: 05/09/19  7:07 PM  Result Value Ref Range   Prothrombin Time 15.3 (H) 11.4 - 15.2 seconds   INR 1.2 0.8 - 1.2    Comment: (NOTE) INR goal varies based on device and disease states. Performed at [facility], [address] [place], [place] [address]   APTT     Status: Abnormal   Collection Time: 05/09/19  7:07 PM  Result Value Ref Range   aPTT 39 (H) 24 - 36 seconds    Comment:        IF BASELINE aPTT IS ELEVATED, SUGGEST PATIENT RISK ASSESSMENT BE USED TO DETERMINE APPROPRIATE ANTICOAGULANT THERAPY. Performed at [facility], [address] [place], [place] [address]   ABO/Rh     Status: None   Collection Time: 05/09/19  7:07 PM  Result Value Ref Range   ABO/RH(D)      O POS Performed at [facility], [place], [place] [address]   Glucose, capillary     Status: Abnormal   Collection Time: 05/09/19  8:14 PM  Result Value Ref Range   Glucose-Capillary 146 (H) 70 - 99 mg/dL  Prepare RBC     Status: None   Collection Time: 05/09/19  9:00 PM  Result Value Ref Range   Order Confirmation      ORDER  PROCESSED BY BLOOD BANK Performed at [facility], [address] [place], [place] [address]   Type and screen [place]     Status: None   Collection Time: 05/09/19  9:15 PM  Result Value Ref Range   ABO/RH(D) O POS    Antibody Screen NEG    Sample Expiration 05/12/2019,2359    Unit Number [id number]    Blood Component Type RED CELLS,LR    Unit division 00    Status of Unit ISSUED,FINAL    Transfusion Status OK TO TRANSFUSE    Crossmatch Result      Compatible Performed at [facility], [address] [place], [place] [address]   SARS Coronavirus 2 [facility] order, Performed in [place] hospital lab)     Status: None   Collection Time: 05/09/19  9:28 PM  Result  Value Ref Range   SARS Coronavirus 2 NEGATIVE NEGATIVE    Comment: (NOTE) If result is NEGATIVE SARS-CoV-2 target nucleic acids are NOT DETECTED. The SARS-CoV-2 RNA is generally detectable in upper and lower  respiratory specimens during the acute phase of infection. The lowest  concentration of SARS-CoV-2 viral copies this assay can detect is 250  copies / mL. A negative result does not preclude SARS-CoV-2 infection  and should not be used as the sole basis for treatment or other  patient management decisions.  A negative result may occur with  improper specimen collection / handling, submission of specimen other  than nasopharyngeal swab, presence of viral mutation(s) within the  areas targeted by this assay, and inadequate number of viral copies  (<250 copies / mL). A negative result must be combined with clinical  observations, patient history, and epidemiological information. If result is POSITIVE SARS-CoV-2 target nucleic acids are DETECTED. The SARS-CoV-2 RNA is generally detectable in upper and lower  respiratory specimens dur ing the acute phase of infection.  Positive  results are indicative of active infection with SARS-CoV-2.  Clinical  correlation with patient history and other diagnostic information is  necessary to determine patient infection status.  Positive results do  not rule out bacterial infection or co-infection with other viruses. If result is PRESUMPTIVE POSTIVE SARS-CoV-2 nucleic acids MAY BE PRESENT.   A presumptive positive result was obtained on the submitted specimen  and confirmed on repeat testing.  While 2019 novel coronavirus  (SARS-CoV-2) nucleic acids may be present in the submitted sample  additional confirmatory testing may be necessary for epidemiological  and / or clinical management purposes  to differentiate between  SARS-CoV-2 and other Sarbecovirus currently known to infect humans.  If clinically indicated additional testing with an  alternate test  methodology [phone]) is advised. The SARS-CoV-2 RNA is generally  detectable in upper and lower respiratory sp ecimens during the acute  phase of infection. The expected result is Negative. Fact Sheet for Patients:  StrictlyIdeas.no Fact Sheet for Healthcare Providers: BankingDealers.co.za This test is not yet approved or cleared by the [place] FDA and has been authorized for detection and/or diagnosis of SARS-CoV-2 by FDA under an Emergency Use Authorization (EUA).  This EUA will remain in effect (meaning this test can be used) for the duration of the COVID-19 declaration under Section 564(b)(1) of the Act, 21 U.S.C. section 360bbb-3(b)(1), unless the authorization is terminated or revoked sooner. Performed at [facility], [address] [place], [place] [address]   Glucose, capillary     Status: None   Collection Time: 05/09/19 10:16 PM  Result Value Ref Range   Glucose-Capillary 90 70 - 99 mg/dL  TSH     Status: Abnormal   Collection Time: 05/10/19  4:05 AM  Result Value Ref Range   TSH 17.536 (H) 0.350 - 4.500 uIU/mL    Comment: Performed by a 3rd Generation assay with a functional sensitivity of <=0.01 uIU/mL. Performed at [facility], [address] [place], [place] [address]   Basic metabolic panel     Status: Abnormal   Collection Time: 05/10/19  4:05 AM  Result Value Ref Range   Sodium 141 135 - 145 mmol/L   Potassium 5.0 3.5 - 5.1 mmol/L   Chloride 115 (H) 98 - 111 mmol/L   CO2 22 22 - 32 mmol/L   Glucose, Bld 81 70 - 99 mg/dL   BUN 53 (H) 8 - 23 mg/dL   Creatinine, Ser 3.95 (H) 0.61 - 1.24 mg/dL   Calcium 7.8 (L) 8.9 - 10.3 mg/dL   GFR calc non Af Amer 15 (L) >60 mL/min   GFR calc Af Amer 17 (L) >60 mL/min   Anion gap 4 (L) 5 - 15    Comment: Performed at [facility], [address] [place], [place] [address]  CBC     Status: Abnormal   Collection Time:  05/10/19  4:05 AM  Result Value Ref Range   WBC 5.3 4.0 - 10.5 K/uL   RBC 3.00 (L) 4.22 - 5.81 MIL/uL   Hemoglobin 8.6 (L) 13.0 - 17.0 g/dL   HCT 26.9 (L) 39.0 - 52.0 %   MCV 89.7 80.0 - 100.0 fL   MCH 28.7 26.0 - 34.0 pg   MCHC 32.0 30.0 - 36.0 g/dL   RDW 13.8 11.5 - 15.5 %   Platelets 137 (L) 150 - 400 K/uL   nRBC 0.0 0.0 - 0.2 %    Comment: Performed at [facility], [address] [place], [place] [address]  Glucose, capillary     Status: None   Collection Time: 05/10/19  7:38 AM  Result Value Ref Range   Glucose-Capillary 87 70 - 99 mg/dL  Glucose, capillary     Status: None   Collection Time: 05/10/19 11:36 AM  Result Value Ref Range   Glucose-Capillary 96 70 - 99 mg/dL  Ammonia     Status: None   Collection Time: 05/10/19  3:29 PM  Result Value Ref Range   Ammonia 19 9 - 35 umol/L    Comment: Performed at [facility], [address] [place], [place] [address]    Current Facility-Administered Medications  Medication Dose Route Frequency Provider Last Rate Last Dose  . 0.9 %  sodium chloride infusion  10 mL/hr Intravenous Once Marjean Donna E, MD      . 0.9 %  sodium chloride infusion   Intravenous Continuous Seals, Theo Dills, NP 50 mL/hr at 05/10/19 0300    . acetaminophen (TYLENOL) tablet 650 mg  650 mg Oral Q6H PRN Seals, Theo Dills, NP       Or  . acetaminophen (TYLENOL) suppository 650 mg  650 mg Rectal Q6H PRN Seals, Theo Dills, NP      . amLODipine (NORVASC) tablet 10 mg  10 mg Oral Daily Seals, Theo Dills, NP   Stopped at 05/10/19 1220  . aspirin EC tablet 81 mg  81 mg Oral Daily Seals, Theo Dills, NP   Stopped at 05/10/19 1220  . atorvastatin (LIPITOR) tablet 40 mg  40 mg Oral QHS Seals, Walton  H, NP      . citalopram (CELEXA) tablet 20 mg  20 mg Oral Daily Seals, Theo Dills, NP   Stopped at 05/10/19 1220  . clopidogrel (PLAVIX) tablet 75 mg  75 mg Oral Daily Seals, Theo Dills, NP   Stopped at 05/10/19 1220  . divalproex (DEPAKOTE ER) 24 hr tablet 500 mg   500 mg Oral BID Mayer Camel, NP   Stopped at 05/10/19 1220  . glipiZIDE (GLUCOTROL) tablet 2.5 mg  2.5 mg Oral BID Mayer Camel, NP   Stopped at 05/10/19 1221  . insulin aspart (novoLOG) injection 0-15 Units  0-15 Units Subcutaneous TID WC Seals, Angela H, NP      . insulin aspart (novoLOG) injection 0-5 Units  0-5 Units Subcutaneous QHS Seals, Theo Dills, NP      . Derrill Memo ON 05/11/2019] levothyroxine (SYNTHROID) tablet 125 mcg  125 mcg Oral QAC breakfast Max Sane, MD      . lipase/protease/amylase (CREON) capsule 12,000 Units  12,000 Units Oral TID WC Seals, Theo Dills, NP   Stopped at 05/10/19 1218  . metoprolol tartrate (LOPRESSOR) tablet 25 mg  25 mg Oral BID Mayer Camel, NP   Stopped at 05/10/19 1218  . ondansetron (ZOFRAN) tablet 4 mg  4 mg Oral Q6H PRN Seals, Theo Dills, NP       Or  . ondansetron (ZOFRAN) injection 4 mg  4 mg Intravenous Q6H PRN Seals, Theo Dills, NP   4 mg at 05/10/19 1229  . pantoprazole (PROTONIX) injection 40 mg  40 mg Intravenous Q12H Seals, Angela H, NP      . polyethylene glycol (MIRALAX / GLYCOLAX) packet 17 g  17 g Oral Daily PRN Seals, Levada Dy H, NP      . sodium bicarbonate tablet 650 mg  650 mg Oral BID Mayer Camel, NP   Stopped at 05/10/19 1219  . tamsulosin (FLOMAX) capsule 0.4 mg  0.4 mg Oral Daily Seals, Theo Dills, NP   Stopped at 05/10/19 1219    Musculoskeletal: Strength & Muscle Tone: decreased Gait & Station: Unable to assess, patient remained in the bed during evaluation Patient leans: N/A  Psychiatric Specialty Exam: Physical Exam  Nursing note and vitals reviewed. Constitutional: He is oriented to person, place, and time. He appears well-developed and well-nourished.  Cardiovascular: Normal rate.  Respiratory: Effort normal.  Musculoskeletal: Normal range of motion.  Neurological: He is alert and oriented to person, place, and time.    Review of Systems  Cardiovascular: Negative.   Skin: Negative.   Psychiatric/Behavioral:  Negative.     Blood pressure (!) 188/81, pulse 65, temperature 98.1 F (36.7 C), resp. rate 20, height 5\' 11"  (1.803 m), weight 68 kg, SpO2 99 %.Body mass index is 20.92 kg/m.  General Appearance: Disheveled  Eye Contact:  None: Prosthetic left eye and right orbit swollen from fall  Speech:  Slow and Slurred  Volume:  Decreased  Mood:  Euthymic  Affect:  Flat  Thought Process:  Coherent and Descriptions of Associations: Intact  Orientation:  Full (Time, Place, and Person)  Thought Content:  WDL  Suicidal Thoughts:  No  Homicidal Thoughts:  No  Memory:  Immediate;   Fair Recent;   Fair  Judgement:  Fair  Insight:  Lacking  Psychomotor Activity:  Increased  Concentration:  Concentration: Fair  Recall:  [employer] of Knowledge:  Fair  Language:  Poor  Akathisia:  No  Handed:    AIMS (if indicated):  Assets:  Catering manager Housing Resilience Social Support  ADL's:  Impaired  Cognition:  WNL  Sleep:        Treatment Plan Summary: Daily contact with patient to assess and evaluate symptoms and progress in treatment  Disposition: No evidence of imminent risk to self or others at present.   Patient does not meet criteria for psychiatric inpatient admission.  Follow for observation of progression  Lewis Shock, FNP 05/10/2019 4:19 PM

## 2019-05-10 NOTE — Progress Notes (Signed)
I have talked with both sisters over phone and updated about patient's condition

## 2019-05-10 NOTE — Evaluation (Signed)
Physical Therapy Evaluation Patient Details Name: Cameron Adams MRN: [id number] DOB: [birth year] Today's Date: 05/10/2019   History of Present Illness  68 y.o. male with a known history of diabetes mellitus, CKD, diastolic CHF, history of seizure, hypothyroidism, and history of urinary retention.  Patient presented to the emergency room after sustaining a fall while attempting to get into his wheelchair, apparently he hit his R eye at this time as well.   Clinical Impression  Pt did not do particularly well with PT today, he was impulsive, confused and appeared to be sleepy as he did not open his eyes or follow even basic instructions t/o more aggressive cuing tactics.  On arrival pt was laying perpendicular in the bed and needed heavy assist to get back to semblance of normalcy.  Then with strength testing and transition to EOB he needed a lot of reorientation and cuing with marginal results.  In sitting he struggled to maintain upright w/o direct physical assist and was not in anyway safe to attempt standing today.  Pt highly limited functionally, unsure of his more recent level of function but per today's performance this seems quite limited.    Follow Up Recommendations SNF    Equipment Recommendations  Rolling walker with 5" wheels;Wheelchair cushion (measurements PT);Wheelchair (measurements PT)    Recommendations for Other Services       Precautions / Restrictions Precautions Precautions: Fall Restrictions Weight Bearing Restrictions: No      Mobility  Bed Mobility Overal bed mobility: Needs Assistance Bed Mobility: Supine to Sit;Sit to Supine     Supine to sit: Max assist Sit to supine: Max assist   General bed mobility comments: Pt unable to do a lot of active movement even with heavy cuing and guidance.  Once up in sitting w/o assist he could not maintain is balance and was falling back and to each side (R>L) with very inconsistent ability to make corrections or follow  instructions  Transfers                 General transfer comment: Set up attempts to stand with walker, etc; but pt too unsteady in sitting and too impulsive and confused to make this a safe proposition, deferred this date  Ambulation/Gait                Stairs            Wheelchair Mobility    Modified Rankin (Stroke Patients Only)       Balance Overall balance assessment: Needs assistance Sitting-balance support: Feet unsupported;No upper extremity supported Sitting balance-Leahy Scale: Zero Sitting balance - Comments: Very poor tolerance with seated balance, losing balance and requiring considerable assist t/o most of the effort.                                     Pertinent Vitals/Pain Pain Assessment: (unable to rate when asked, does not seem to have much pain)    Home Living Family/patient expects to be discharged to:: Unsure Living Arrangements: Children(daughter has been staying with him) Available Help at Discharge: Family Type of Home: House Home Access: Level entry     Home Layout: Two level;Bed/bath upstairs Home Equipment: Walker - 2 wheels;Cane - single point(w/c?)      Prior Function Level of Independence: Needs assistance         Comments: Pt indicating that until recently he was relatively independent, difficult  to get full information from pt     Hand Dominance        Extremity/Trunk Assessment   Upper Extremity Assessment Upper Extremity Assessment: Generalized weakness;Difficult to assess due to impaired cognition    Lower Extremity Assessment Lower Extremity Assessment: Generalized weakness;Difficult to assess due to impaired cognition       Communication   Communication: Other (comment)  Cognition Arousal/Alertness: Lethargic Behavior During Therapy: Impulsive;Restless Overall Cognitive Status: Difficult to assess                                        General Comments  General comments (skin integrity, edema, etc.): On arrival pt had gotten himself perpendicular to the bed and essentially stuck...    Exercises     Assessment/Plan    PT Assessment Patient needs continued PT services  PT Problem List Decreased strength;Decreased range of motion;Decreased activity tolerance;Decreased balance;Decreased knowledge of use of DME;Decreased coordination;Decreased mobility;Decreased safety awareness;Pain       PT Treatment Interventions Therapeutic activities;Therapeutic exercise;Balance training;Functional mobility training;DME instruction;Gait training    PT Goals (Current goals can be found in the Care Plan section)  Acute Rehab PT Goals Patient Stated Goal: to go home PT Goal Formulation: With patient Time For Goal Achievement: 05/25/19 Potential to Achieve Goals: Fair    Frequency Min 2X/week   Barriers to discharge        Co-evaluation               AM-PAC PT "6 Clicks" Mobility  Outcome Measure Help needed turning from your back to your side while in a flat bed without using bedrails?: A Lot Help needed moving from lying on your back to sitting on the side of a flat bed without using bedrails?: Total Help needed moving to and from a bed to a chair (including a wheelchair)?: Total Help needed standing up from a chair using your arms (e.g., wheelchair or bedside chair)?: Total Help needed to walk in hospital room?: Total Help needed climbing 3-5 steps with a railing? : Total 6 Click Score: 7    End of Session   Activity Tolerance: Patient limited by lethargy Patient left: with bed alarm set;with call bell/phone within reach Nurse Communication: Mobility status(need for new condom catheter placement) PT Visit Diagnosis: Other abnormalities of gait and mobility (R26.89);Muscle weakness (generalized) (M62.81);Difficulty in walking, not elsewhere classified (R26.2)    Time: 3300-7622 PT Time Calculation (min) (ACUTE ONLY): 23  min   Charges:   PT Evaluation $PT Eval Low Complexity: 1 Low          Kreg Shropshire, DPT 05/10/2019, 1:51 PM

## 2019-05-11 ENCOUNTER — Encounter
Admission: EM | Disposition: A | Discharge status: Skilled Nursing Facility | Payer: Self-pay | Source: Home / Self Care | Attending: Internal Medicine

## 2019-05-11 ENCOUNTER — Inpatient Hospital Stay: Payer: Medicare Other | Admitting: Certified Registered"

## 2019-05-11 HISTORY — PX: ESOPHAGOGASTRODUODENOSCOPY: SHX5428

## 2019-05-11 LAB — BASIC METABOLIC PANEL
Anion gap: 7 (ref 5–15)
Anion gap: 4 — ABNORMAL LOW (ref 5–15)
BUN: 60 mg/dL — ABNORMAL HIGH (ref 8–23)
BUN: 64 mg/dL — ABNORMAL HIGH (ref 8–23)
CO2: 17 mmol/L — ABNORMAL LOW (ref 22–32)
CO2: 17 mmol/L — ABNORMAL LOW (ref 22–32)
Calcium: 8 mg/dL — ABNORMAL LOW (ref 8.9–10.3)
Calcium: 7.7 mg/dL — ABNORMAL LOW (ref 8.9–10.3)
Chloride: 118 mmol/L — ABNORMAL HIGH (ref 98–111)
Chloride: 119 mmol/L — ABNORMAL HIGH (ref 98–111)
Creatinine, Ser: 3.71 mg/dL — ABNORMAL HIGH (ref 0.61–1.24)
Creatinine, Ser: 3.77 mg/dL — ABNORMAL HIGH (ref 0.61–1.24)
GFR calc Af Amer: 18 mL/min — ABNORMAL LOW (ref >60)
GFR calc Af Amer: 18 mL/min — ABNORMAL LOW (ref >60)
GFR calc non Af Amer: 16 mL/min — ABNORMAL LOW (ref >60)
GFR calc non Af Amer: 15 mL/min — ABNORMAL LOW (ref >60)
Glucose, Bld: 82 mg/dL (ref 70–99)
Glucose, Bld: 96 mg/dL (ref 70–99)
Potassium: 5.5 mmol/L — ABNORMAL HIGH (ref 3.5–5.1)
Potassium: 5.5 mmol/L — ABNORMAL HIGH (ref 3.5–5.1)
Sodium: 142 mmol/L (ref 135–145)
Sodium: 140 mmol/L (ref 135–145)

## 2019-05-11 LAB — GLUCOSE, CAPILLARY
Glucose-Capillary: 77 mg/dL (ref 70–99)
Glucose-Capillary: 86 mg/dL (ref 70–99)
Glucose-Capillary: 65 mg/dL — ABNORMAL LOW (ref 70–99)
Glucose-Capillary: 69 mg/dL — ABNORMAL LOW (ref 70–99)
Glucose-Capillary: 87 mg/dL (ref 70–99)
Glucose-Capillary: 81 mg/dL (ref 70–99)
Glucose-Capillary: 92 mg/dL (ref 70–99)
Glucose-Capillary: 127 mg/dL — ABNORMAL HIGH (ref 70–99)

## 2019-05-11 LAB — CBC
HCT: 27.7 % — ABNORMAL LOW (ref 39.0–52.0)
Hemoglobin: 8.8 g/dL — ABNORMAL LOW (ref 13.0–17.0)
MCH: 28.9 pg (ref 26.0–34.0)
MCHC: 31.8 g/dL (ref 30.0–36.0)
MCV: 91.1 fL (ref 80.0–100.0)
Platelets: 157 10*3/uL (ref 150–400)
RBC: 3.04 MIL/uL — ABNORMAL LOW (ref 4.22–5.81)
RDW: 14.5 % (ref 11.5–15.5)
WBC: 7.8 10*3/uL (ref 4.0–10.5)
nRBC: 0 % (ref 0.0–0.2)

## 2019-05-11 SURGERY — EGD (ESOPHAGOGASTRODUODENOSCOPY)
Anesthesia: General

## 2019-05-11 MED ORDER — PROPOFOL 10 MG/ML IV BOLUS
INTRAVENOUS | Status: DC | PRN
  Administered 2019-05-11 (×4): 20 mg via INTRAVENOUS

## 2019-05-11 MED ORDER — EPHEDRINE SULFATE 50 MG/ML IJ SOLN
INTRAMUSCULAR | Status: DC | PRN
  Administered 2019-05-11: 5 mg via INTRAVENOUS

## 2019-05-11 MED ORDER — DEXTROSE 5 % IV SOLN
INTRAVENOUS | Status: DC
  Administered 2019-05-11: 14:00:00 via INTRAVENOUS
  Administered 2019-05-11: 15:00:00 100 mL via INTRAVENOUS

## 2019-05-11 MED ORDER — STERILE WATER FOR INJECTION IV SOLN
INTRAVENOUS | Status: DC
  Administered 2019-05-11: 18:00:00 via INTRAVENOUS
  Filled 2019-05-11 (×2): qty 850

## 2019-05-11 MED ORDER — DEXTROSE 5 % IV SOLN
INTRAVENOUS | Status: DC
  Administered 2019-05-11 – 2019-05-12 (×2): via INTRAVENOUS

## 2019-05-11 MED ORDER — HYDRALAZINE HCL 20 MG/ML IJ SOLN
10.0000 mg | Freq: Four times a day (QID) | INTRAMUSCULAR | Status: DC | PRN
  Administered 2019-05-11 – 2019-05-20 (×3): 10 mg via INTRAVENOUS
  Filled 2019-05-11 (×3): qty 1

## 2019-05-11 MED ORDER — SODIUM CHLORIDE 0.9 % IV SOLN
INTRAVENOUS | Status: DC
  Administered 2019-05-11: 14:00:00 via INTRAVENOUS

## 2019-05-11 NOTE — Anesthesia Post-op Follow-up Note (Signed)
Anesthesia QCDR form completed.        

## 2019-05-11 NOTE — Progress Notes (Signed)
[place] [facility] [place] NAME: Cameron Adams    MR#:  [id number]  DATE OF BIRTH:  1950/11/23  SUBJECTIVE:  CHIEF COMPLAINT:   Chief Complaint  Patient presents with  . Fall  Alert, wants to go home.  Waiting for endoscopy today REVIEW OF SYSTEMS:  Review of Systems  Constitutional: Negative for diaphoresis, fever, malaise/fatigue and weight loss.  HENT: Negative for ear discharge, ear pain, hearing loss, nosebleeds, sore throat and tinnitus.   Eyes: Negative for blurred vision and pain.  Respiratory: Negative for cough, hemoptysis, shortness of breath and wheezing.   Cardiovascular: Negative for chest pain, palpitations, orthopnea and leg swelling.  Gastrointestinal: Negative for abdominal pain, blood in stool, constipation, diarrhea, heartburn, nausea and vomiting.  Genitourinary: Negative for dysuria, frequency and urgency.  Musculoskeletal: Negative for back pain and myalgias.  Skin: Negative for itching and rash.  Neurological: Negative for dizziness, tingling, tremors, focal weakness, seizures, weakness and headaches.  Psychiatric/Behavioral: Negative for depression. The patient is not nervous/anxious.     DRUG ALLERGIES:   Allergies  Allergen Reactions  . Gabapentin     Other reaction(s): Other (See Comments) Trouble sleeping    VITALS:  Blood pressure 99/61, pulse (!) 48, temperature (!) 97.5 F (36.4 C), resp. rate 17, height 5\' 11"  (1.803 m), weight 68 kg, SpO2 99 %. PHYSICAL EXAMINATION:  Physical Exam HENT:     Head: Normocephalic and atraumatic.  Eyes:     Conjunctiva/sclera: Conjunctivae normal.     Pupils: Pupils are equal, round, and reactive to light.  Neck:     Musculoskeletal: Normal range of motion and neck supple.     Thyroid: No thyromegaly.     Trachea: No tracheal deviation.  Cardiovascular:     Rate and Rhythm: Normal rate and regular rhythm.     Heart sounds: Normal heart sounds.   Pulmonary:     Effort: Pulmonary effort is normal. No respiratory distress.     Breath sounds: Normal breath sounds. No wheezing.  Chest:     Chest wall: No tenderness.  Abdominal:     General: Bowel sounds are normal. There is no distension.     Palpations: Abdomen is soft.     Tenderness: There is no abdominal tenderness.  Musculoskeletal: Normal range of motion.  Skin:    General: Skin is warm and dry.     Findings: No rash.  Neurological:     Mental Status: He is disoriented and confused.     Cranial Nerves: No cranial nerve deficit.  Psychiatric:     Comments: Unable to evaluate due to his mental status    LABORATORY PANEL:  Male CBC Recent Labs  Lab 05/10/19 1814  WBC 8.2  HGB 8.8*  HCT 27.8*  PLT 136*   ------------------------------------------------------------------------------------------------------------------ Chemistries  Recent Labs  Lab 05/11/19 1154  NA 142  K 5.5*  CL 118*  CO2 17*  GLUCOSE 82  BUN 60*  CREATININE 3.71*  CALCIUM 8.0*   RADIOLOGY:  No results found. ASSESSMENT AND PLAN:  68 y.o.malewith a known history of diabetes mellitus, CKD, diastolic CHF, history of seizure, hypothyroidism, and history of urinary retention admitted for AMS. AMS most likely multifactoriel includingi metabolic/electrolytes abnties, infections. Pateitn has a hs/o of sz on depakote.  1.  Acute blood loss anemia -It is post EGD showing previous bleeding evidence from Mallory-Weiss tear.  Nothing acute -  Status post 1 unit  packed red blood cells on this admission -H&H remained stable  2.  Acute on chronic renal failure -  nephrology following, renal function improving  3.  Nausea/vomiting -Resolved  4.  Diabetes mellitus - Moderate sliding scale insulin  5.  Acute metabolic encephalopathy -Seem to be back to his baseline mental status - Continue depakote 500 mg bid.  Normal ammonia level   therapist recommends rehab    All the records are  reviewed and case discussed with Care Management/Social Worker. Management plans discussed with the patient, family (discussed with his daughter Elwin Sleight over phone) and they are in agreement.  CODE STATUS: Full Code  TOTAL TIME TAKING CARE OF THIS PATIENT: 35 minutes.   More than 50% of the time was spent in counseling/coordination of care: YES  POSSIBLE D/C IN 1 DAYS, DEPENDING ON CLINICAL CONDITION.   Max Sane M.D on 05/11/2019 at 3:01 PM  Between 7am to 6pm - Pager - [phone]  After 6pm go to [email or web address] - Proofreader  [facility]  Office  [phone]  CC: Primary care physician; [name], MD  Note: This dictation was prepared with Dragon dictation along with smaller phrase technology. Any transcriptional errors that result from this process are unintentional.

## 2019-05-11 NOTE — Progress Notes (Signed)
Physical Therapy Treatment Patient Details Name: Cameron Adams MRN: [id number] DOB: 10/19/1950 Today's Date: 05/11/2019    History of Present Illness 68 y.o. male with a known history of diabetes mellitus, CKD, diastolic CHF, history of seizure, hypothyroidism, CVA, Lt prosthetic eye, and history of urinary retention.  Patient presented to the emergency room after sustaining a fall while attempting to get into his wheelchair, apparently he hit his R eye at this time as well. Pt has been at [facility] for STR since prior admission.    PT Comments    Pt received in bed appears lethargic as per yesterday, but is interactive and more alert once sitting at EOB. DOes well with transfers from EOB, no physical assist. Pt able to progress toward some AMB this date, but had giddiness, fatigue, and BUE tremor after 3ft AMB. Some LOB in gait that required minA from author to correct. Pt progressing well. Still quite limited and needs heavy assist for safety and to return to PLOF in mobility.   Follow Up Recommendations  SNF     Equipment Recommendations  Other (comment)(to be determined by facility)    Recommendations for Other Services       Precautions / Restrictions Precautions Precautions: Fall Precaution Comments: Hx of seizures Restrictions Weight Bearing Restrictions: No    Mobility  Bed Mobility Overal bed mobility: Needs Assistance       Supine to sit: Supervision Sit to supine: Min assist   General bed mobility comments: poor awreness of line safety, Chief Strategy Officer manages catheter and IV  Transfers Overall transfer level: Needs assistance Equipment used: Rolling walker (2 wheeled) Transfers: Sit to/from Stand Sit to Stand: Supervision         General transfer comment: cued, performs 5x from EOB, has fluent and safe technique and hand placement, no LOB in standing.  Ambulation/Gait Ambulation/Gait assistance: Min guard Gait Distance (Feet): 24 Feet Assistive device: Rolling  walker (2 wheeled) Gait Pattern/deviations: Step-to pattern     General Gait Details: 3-point step to with RW, alternating fwd and retroAMB; does not keep RW at safe distance from corpus, some gait instability when backing up, minA to correct.(Stops to rest after 57ft, become slightheaded and begins to tremulate in BUE, which he says happens when he gets dizzy.)   Stairs             Wheelchair Mobility    Modified Rankin (Stroke Patients Only)       Balance Overall balance assessment: Needs assistance Sitting-balance support: Feet unsupported;Single extremity supported;No upper extremity supported Sitting balance-Leahy Scale: Good     Standing balance support: During functional activity;Bilateral upper extremity supported Standing balance-Leahy Scale: Fair               High level balance activites: Backward walking              Cognition Arousal/Alertness: Lethargic Behavior During Therapy: WFL for tasks assessed/performed   Area of Impairment: Orientation                 Orientation Level: Person;Place             General Comments: delayed response time, but follows cues fairly well, answers questions.      Exercises General Exercises - Lower Extremity Short Arc Quad: AROM;Both;15 reps;Supine;Limitations Short Arc Quad Limitations: requires multimodal cues for performance. Hip ABduction/ADduction: AAROM;Limitations;10 reps;Supine;Both Hip Abduction/Adduction Limitations: requires multimodal cues for performance. Hip Flexion/Marching: AAROM;Limitations;Both;10 reps;Supine Hip Flexion/Marching Limitations: requires multimodal cues for performance. Other Exercises  Other Exercises: supine single leg leg press (author resisted) 1x10 bilat(requires multimodal cues for performance.)    General Comments        Pertinent Vitals/Pain Pain Assessment: No/denies pain    Home Living                      Prior Function             PT Goals (current goals can now be found in the care plan section) Acute Rehab PT Goals Patient Stated Goal: to go home PT Goal Formulation: With patient Time For Goal Achievement: 05/25/19 Potential to Achieve Goals: Fair Progress towards PT goals: Progressing toward goals    Frequency    Min 2X/week      PT Plan Current plan remains appropriate    Co-evaluation              AM-PAC PT "6 Clicks" Mobility   Outcome Measure  Help needed turning from your back to your side while in a flat bed without using bedrails?: None Help needed moving from lying on your back to sitting on the side of a flat bed without using bedrails?: None Help needed moving to and from a bed to a chair (including a wheelchair)?: A Little Help needed standing up from a chair using your arms (e.g., wheelchair or bedside chair)?: A Little Help needed to walk in hospital room?: A Little Help needed climbing 3-5 steps with a railing? : A Lot 6 Click Score: 19    End of Session Equipment Utilized During Treatment: Gait belt Activity Tolerance: Patient tolerated treatment well;Patient limited by fatigue;Treatment limited secondary to medical complications (Comment)(worsening giddiness and BUE tremulation after AMB) Patient left: with bed alarm set;with call bell/phone within reach;in bed Nurse Communication: Mobility status PT Visit Diagnosis: Other abnormalities of gait and mobility (R26.89);Muscle weakness (generalized) (M62.81);Difficulty in walking, not elsewhere classified (R26.2)     Time: 7673-4193 PT Time Calculation (min) (ACUTE ONLY): 24 min  Charges:  $Therapeutic Exercise: 23-37 mins                     11:54 AM, 05/11/19 Etta Grandchild, PT, DPT Physical Therapist - [facility]  [phone] ([place])    Lumi Winslett C 05/11/2019, 11:52 AM

## 2019-05-11 NOTE — Plan of Care (Signed)
  Problem: Education: Goal: Knowledge of General Education information will improve Description: Including pain rating scale, medication(s)/side effects and non-pharmacologic comfort measures Outcome: Progressing Pt and family understand the nature behind his hospitalization.  He has no further questions at the moment. Will continue to  offer support PRN.    Problem: Health Behavior/Discharge Planning: Goal: Ability to manage health-related needs will improve Outcome: Progressing Pt and family understand his POC and expectations during this hospitalization.    Problem: Clinical Measurements: Goal: Ability to maintain clinical measurements within normal limits will improve Outcome: Progressing Pt's VS WNL. Will continue to monitor and assess.    Problem: Clinical Measurements: Goal: Will remain free from infection Outcome: Progressing S/Sx of infection monitor and assessed q-shift/ PRN.  Pt has been afebrile thus far.  Will continue to monitor and assess.    Problem: Clinical Measurements: Goal: Respiratory complications will improve Outcome: Progressing Respiratory status monitored and assessed q-shift/ PRN, along with VS.  Pt is on RA with O2 saturations at 94-100% and respiration rate of 11-18 breaths per minute.  Pt has not endorsed SOB or DOE. Will continue to monitor and assess.    Problem: Clinical Measurements: Goal: Cardiovascular complication will be avoided Outcome: Not Progressing Pt's VS WNL. Will continue to monitor and assess.    Problem: Activity: Goal: Risk for activity intolerance will decrease Outcome: Progressing  Pt is able to turn himself in the bed.  He is x2 assist to the [facility] of x1 assist to the bedpan.    Problem: Nutrition: Goal: Adequate nutrition will be maintained Outcome: Progressing Pt is on a regular diet.  He was able to tolerate his dinner this shift.     Problem: Coping: Goal: Level of anxiety will decrease Outcome: Progressing Pt has not  expressed c/o anxiety r/t his hospitalization.    Problem: Elimination: Goal: Will not experience complications related to bowel motility Outcome: Progressing  Pt is able to turn himself in the bed.  He is x2 assist to the [facility] of x1 assist to the bedpan.  He has a condom catheter to contain his urine.    Problem: Elimination: Goal: Will not experience complications related to urinary retention Outcome: Progressing Pt is x2 assist to the [facility] of x1 assist to the bedpan.  He has a condom catheter to contain his urine.    Problem: Pain Management: Goal: General experience of comfort will improve Outcome: Progressing Pt has denied pain thus far.  Will continue to monitor and assess.   Problem: Safety: Goal: Ability to remain free from injury will improve Outcome: Progressing Instructed pt to utilize RN call light for assistance.  Hourly rounds performed.  Bed alarm implemented to keep pt safe from falls.  Settings set to third most sensitive mode.  Bed in lowest position, locked with two upper side rail engaged.  Belongings and call light within reach.   Problem: Skin Integrity: Goal: Risk for impaired skin integrity will decrease Outcome: Progressing Skin impairment monitored and assessed q-shift/ PRN.  Instructed pt to turn q2 hours to prevent further skin impairment.  Tubes and drains assessed for device relate pressure sores.  Dressing changes performed WOC RN and MD's orders.  Will continue to monitor and assess.

## 2019-05-11 NOTE — Progress Notes (Signed)
Patient refused all medication last night. This AM blood pressure elevated will inform MD

## 2019-05-11 NOTE — Interval H&P Note (Signed)
History and Physical Interval Note:  05/11/2019 2:20 PM  Cameron Adams  has presented today for surgery, with the diagnosis of Hematemesis, Acute blood loss anemia.  The various methods of treatment have been discussed with the patient and family. After consideration of risks, benefits and other options for treatment, the patient has consented to  Procedure(s): ESOPHAGOGASTRODUODENOSCOPY (EGD) (N/A) as a surgical intervention.  The patient's history has been reviewed, patient examined, no change in status, stable for surgery.  I have reviewed the patient's chart and labs.  Questions were answered to the patient's satisfaction.     [place], [place]

## 2019-05-11 NOTE — Progress Notes (Signed)
8/5: No major events overnight Ammonia level 19 Patient is more alert and interactive Neuro exam: Alert and awake, eyes closed, states his name/his date birth and location follows commands, speech at baseline when asked..  CN: PERLA, eomi ( difficult to assess due to eyelid edema), face symmetrical , no nystagamus, uvula tongue midline Motor: 4/5 in all extremities Sensory examination to light touch difficult to conduct No coordination deficit DTR and gait not checked/ gait not checked  Recs:  Neuro protectives measures including normothermia, normoglycemia, correct electrolytes/metabolic abnlities, treat any infection if present. Continue neuro check while in hospital Continue depakote 500 mg bid. Given clinical course/improvement on his neuro exam , most likely AMS multi factoriel  due to metabolic/electrolytes/infectious causes Neurologically stable. Will sign off. Please call if nay concern, deterioration or any new neurological events. Remaining of treatment by primary team. Discussed the case with primary tea   Past Medical History:  Diagnosis Date  . Anemia of chronic disease   . Blindness of left eye   . Chronic kidney disease, stage III (moderate) (HCC)   . Diabetes ([place])   . Diarrhea   . Diastolic dysfunction    a. 06/2017 Echo: EF 55-60%, no rwma, Gr2 DD, Asc Ao 3.9cm, nl RV fxn, nl PASP.  Marland Kitchen Hypertension   . Hypothyroidism   . Monoclonal gammopathy of unknown significance (MGUS)    a. prev seen @ [facility].  Lost to f/u - never had BM bx.  . Orthostatic hypotension   . Syncope and collapse     Past Surgical History:  Procedure Laterality Date  . 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 PERIPHERAL VASCULAR CATHETERIZATION N/A 09/15/2016   Procedure: Lower Extremity Angiography;  Surgeon: Algernon Huxley, MD;  Location: [place] [facility];  Service:  Cardiovascular;  Laterality: N/A;  . THYROID SURGERY      Family History  Problem Relation Age of Onset  . Kidney failure Mother   . Heart disease Father   . Kidney failure Father   . Heart disease Brother     Social History:  reports that he quit smoking about 40 years ago. His smoking use included cigarettes. He has a 2.50 pack-year smoking history. He has never used smokeless tobacco. He reports previous alcohol use. He reports that he does not use drugs.  Allergies  Allergen Reactions  . Gabapentin     Other reaction(s): Other (See Comments) Trouble sleeping     Medications: I have reviewed the patient's current medications.  ROS: uanble to perform due to MS Physical Examination: Blood pressure (!) 186/70, pulse (!) 53, temperature 98 F (36.7 C), temperature source Oral, resp. rate 18, height 5\' 11"  (1.803 m), weight 68 kg, SpO2 100 %.  Neurologic Examination Patient lying on bed, eyes closed, states his name when asked, follows commands, speech seems little dysarthric. Ha s a foley inserted CN: PERLA, eomi ( difficult to assess due to eyelid edema), face symmetrical , no nystagamus, uvula tongue midline Motor: 4/5 in all extremities Sensory examination to light touch diffciutl to conduct No coordination deficit DTR and gait not checked.  Results for orders placed or performed during the hospital encounter of 05/09/19 (from the past 48 hour(s))  CBC with Differential     Status: Abnormal   Collection Time: 05/09/19  7:07 PM  Result Value Ref Range   WBC 4.7 4.0 - 10.5  K/uL   RBC 2.63 (L) 4.22 - 5.81 MIL/uL   Hemoglobin 7.6 (L) 13.0 - 17.0 g/dL   HCT 24.1 (L) 39.0 - 52.0 %   MCV 91.6 80.0 - 100.0 fL   MCH 28.9 26.0 - 34.0 pg   MCHC 31.5 30.0 - 36.0 g/dL   RDW 14.0 11.5 - 15.5 %   Platelets 141 (L) 150 - 400 K/uL   nRBC 0.0 0.0 - 0.2 %   Neutrophils Relative % 63 %   Neutro Abs 2.9 1.7 - 7.7 K/uL   Lymphocytes Relative 23 %   Lymphs Abs 1.1 0.7 - 4.0 K/uL    Monocytes Relative 7 %   Monocytes Absolute 0.4 0.1 - 1.0 K/uL   Eosinophils Relative 6 %   Eosinophils Absolute 0.3 0.0 - 0.5 K/uL   Basophils Relative 0 %   Basophils Absolute 0.0 0.0 - 0.1 K/uL   Immature Granulocytes 1 %   Abs Immature Granulocytes 0.03 0.00 - 0.07 K/uL    Comment: Performed at [facility], [address] [place], [place] [address]  Basic metabolic panel     Status: Abnormal   Collection Time: 05/09/19  7:07 PM  Result Value Ref Range   Sodium 135 135 - 145 mmol/L   Potassium 5.0 3.5 - 5.1 mmol/L   Chloride 111 98 - 111 mmol/L   CO2 19 (L) 22 - 32 mmol/L   Glucose, Bld 165 (H) 70 - 99 mg/dL   BUN 54 (H) 8 - 23 mg/dL   Creatinine, Ser 3.96 (H) 0.61 - 1.24 mg/dL   Calcium 7.6 (L) 8.9 - 10.3 mg/dL   GFR calc non Af Amer 15 (L) >60 mL/min   GFR calc Af Amer 17 (L) >60 mL/min   Anion gap 5 5 - 15    Comment: Performed at [facility], [address] [place], [place] [address]  Protime-INR     Status: Abnormal   Collection Time: 05/09/19  7:07 PM  Result Value Ref Range   Prothrombin Time 15.3 (H) 11.4 - 15.2 seconds   INR 1.2 0.8 - 1.2    Comment: (NOTE) INR goal varies based on device and disease states. Performed at [facility], [address] [place], [place] [address]   APTT     Status: Abnormal   Collection Time: 05/09/19  7:07 PM  Result Value Ref Range   aPTT 39 (H) 24 - 36 seconds    Comment:        IF BASELINE aPTT IS ELEVATED, SUGGEST PATIENT RISK ASSESSMENT BE USED TO DETERMINE APPROPRIATE ANTICOAGULANT THERAPY. Performed at [facility], [address] [place], [place] [address]   ABO/Rh     Status: None   Collection Time: 05/09/19  7:07 PM  Result Value Ref Range   ABO/RH(D)      O POS Performed at [facility], [address] [place], [place] [address]   Glucose, capillary     Status: Abnormal   Collection Time: 05/09/19  8:14 PM  Result Value Ref Range   Glucose-Capillary 146  (H) 70 - 99 mg/dL  Prepare RBC     Status: None   Collection Time: 05/09/19  9:00 PM  Result Value Ref Range   Order Confirmation      ORDER PROCESSED BY BLOOD BANK Performed at [facility], [address], [place], [place] [address]   Type and screen [facility]     Status: None  Collection Time: 05/09/19  9:15 PM  Result Value Ref Range   ABO/RH(D) O POS    Antibody Screen NEG    Sample Expiration 05/12/2019,2359    Unit Number [id number]    Blood Component Type RED CELLS,LR    Unit division 00    Status of Unit ISSUED,FINAL    Transfusion Status OK TO TRANSFUSE    Crossmatch Result      Compatible Performed at [facility], [address] [place], [place] [address]   SARS Coronavirus 2 [facility] order, Performed in [place] hospital lab)     Status: None   Collection Time: 05/09/19  9:28 PM  Result Value Ref Range   SARS Coronavirus 2 NEGATIVE NEGATIVE    Comment: (NOTE) If result is NEGATIVE SARS-CoV-2 target nucleic acids are NOT DETECTED. The SARS-CoV-2 RNA is generally detectable in upper and lower  respiratory specimens during the acute phase of infection. The lowest  concentration of SARS-CoV-2 viral copies this assay can detect is 250  copies / mL. A negative result does not preclude SARS-CoV-2 infection  and should not be used as the sole basis for treatment or other  patient management decisions.  A negative result may occur with  improper specimen collection / handling, submission of specimen other  than nasopharyngeal swab, presence of viral mutation(s) within the  areas targeted by this assay, and inadequate number of viral copies  (<250 copies / mL). A negative result must be combined with clinical  observations, patient history, and epidemiological information. If result is POSITIVE SARS-CoV-2 target nucleic acids are DETECTED. The SARS-CoV-2 RNA is generally detectable in upper and lower  respiratory  specimens dur ing the acute phase of infection.  Positive  results are indicative of active infection with SARS-CoV-2.  Clinical  correlation with patient history and other diagnostic information is  necessary to determine patient infection status.  Positive results do  not rule out bacterial infection or co-infection with other viruses. If result is PRESUMPTIVE POSTIVE SARS-CoV-2 nucleic acids MAY BE PRESENT.   A presumptive positive result was obtained on the submitted specimen  and confirmed on repeat testing.  While 2019 novel coronavirus  (SARS-CoV-2) nucleic acids may be present in the submitted sample  additional confirmatory testing may be necessary for epidemiological  and / or clinical management purposes  to differentiate between  SARS-CoV-2 and other Sarbecovirus currently known to infect humans.  If clinically indicated additional testing with an alternate test  methodology [phone]) is advised. The SARS-CoV-2 RNA is generally  detectable in upper and lower respiratory sp ecimens during the acute  phase of infection. The expected result is Negative. Fact Sheet for Patients:  StrictlyIdeas.no Fact Sheet for Healthcare Providers: BankingDealers.co.za This test is not yet approved or cleared by the [place] FDA and has been authorized for detection and/or diagnosis of SARS-CoV-2 by FDA under an Emergency Use Authorization (EUA).  This EUA will remain in effect (meaning this test can be used) for the duration of the COVID-19 declaration under Section 564(b)(1) of the Act, 21 U.S.C. section 360bbb-3(b)(1), unless the authorization is terminated or revoked sooner. Performed at [facility], [address] [place], [place] [address]   Glucose, capillary     Status: None   Collection Time: 05/09/19 10:16 PM  Result Value Ref Range   Glucose-Capillary 90 70 - 99 mg/dL  TSH     Status: Abnormal   Collection Time:  05/10/19  4:05 AM  Result Value Ref Range  TSH 17.536 (H) 0.350 - 4.500 uIU/mL    Comment: Performed by a 3rd Generation assay with a functional sensitivity of <=0.01 uIU/mL. Performed at [facility], [address] [place], [place] [address]   Basic metabolic panel     Status: Abnormal   Collection Time: 05/10/19  4:05 AM  Result Value Ref Range   Sodium 141 135 - 145 mmol/L   Potassium 5.0 3.5 - 5.1 mmol/L   Chloride 115 (H) 98 - 111 mmol/L   CO2 22 22 - 32 mmol/L   Glucose, Bld 81 70 - 99 mg/dL   BUN 53 (H) 8 - 23 mg/dL   Creatinine, Ser 3.95 (H) 0.61 - 1.24 mg/dL   Calcium 7.8 (L) 8.9 - 10.3 mg/dL   GFR calc non Af Amer 15 (L) >60 mL/min   GFR calc Af Amer 17 (L) >60 mL/min   Anion gap 4 (L) 5 - 15    Comment: Performed at [facility], [address] [place], [place] [address]  CBC     Status: Abnormal   Collection Time: 05/10/19  4:05 AM  Result Value Ref Range   WBC 5.3 4.0 - 10.5 K/uL   RBC 3.00 (L) 4.22 - 5.81 MIL/uL   Hemoglobin 8.6 (L) 13.0 - 17.0 g/dL   HCT 26.9 (L) 39.0 - 52.0 %   MCV 89.7 80.0 - 100.0 fL   MCH 28.7 26.0 - 34.0 pg   MCHC 32.0 30.0 - 36.0 g/dL   RDW 13.8 11.5 - 15.5 %   Platelets 137 (L) 150 - 400 K/uL   nRBC 0.0 0.0 - 0.2 %    Comment: Performed at [facility], [address] [place], [place] [address]  Glucose, capillary     Status: None   Collection Time: 05/10/19  7:38 AM  Result Value Ref Range   Glucose-Capillary 87 70 - 99 mg/dL  Glucose, capillary     Status: None   Collection Time: 05/10/19 11:36 AM  Result Value Ref Range   Glucose-Capillary 96 70 - 99 mg/dL  Ammonia     Status: None   Collection Time: 05/10/19  3:29 PM  Result Value Ref Range   Ammonia 19 9 - 35 umol/L    Comment: Performed at [facility], [address] [place], [place] [address]  Glucose, capillary     Status: Abnormal   Collection Time: 05/10/19  5:10 PM  Result Value Ref Range   Glucose-Capillary 122 (H) 70 -  99 mg/dL  CBC     Status: Abnormal   Collection Time: 05/10/19  6:14 PM  Result Value Ref Range   WBC 8.2 4.0 - 10.5 K/uL   RBC 2.99 (L) 4.22 - 5.81 MIL/uL   Hemoglobin 8.8 (L) 13.0 - 17.0 g/dL   HCT 27.8 (L) 39.0 - 52.0 %   MCV 93.0 80.0 - 100.0 fL   MCH 29.4 26.0 - 34.0 pg   MCHC 31.7 30.0 - 36.0 g/dL   RDW 14.4 11.5 - 15.5 %   Platelets 136 (L) 150 - 400 K/uL   nRBC 0.0 0.0 - 0.2 %    Comment: Performed at [facility], [address] [place], [place] [address]  Glucose, capillary     Status: Abnormal   Collection Time: 05/10/19  9:08 PM  Result Value Ref Range   Glucose-Capillary 113 (H) 70 - 99 mg/dL  Glucose, capillary     Status: None   Collection Time: 05/11/19  7:48 AM  Result Value Ref Range   Glucose-Capillary 77 70 - 99 mg/dL    Recent Results (from the past 240 hour(s))  Urine Culture     Status: None   Collection Time: 05/04/19  2:10 PM   Specimen: Urine, Clean Catch  Result Value Ref Range Status   Specimen Description   Final    URINE, CLEAN CATCH Performed at [facility], [address], [place], [place] [address]    Special Requests   Final    NONE Performed at [facility], [address] [place], [place] [address]    Culture   Final    NO GROWTH Performed at [facility], [place] [address] [place], [place] [address]    Report Status 05/05/2019 FINAL  Final  SARS Coronavirus 2 [facility] order, Performed in [place] hospital lab)     Status: None   Collection Time: 05/09/19  9:28 PM  Result Value Ref Range Status   SARS Coronavirus 2 NEGATIVE NEGATIVE Final    Comment: (NOTE) If result is NEGATIVE SARS-CoV-2 target nucleic acids are NOT DETECTED. The SARS-CoV-2 RNA is generally detectable in upper and lower  respiratory specimens during the acute phase of infection. The lowest  concentration of SARS-CoV-2 viral copies this assay can detect is 250  copies / mL. A negative result does not preclude SARS-CoV-2  infection  and should not be used as the sole basis for treatment or other  patient management decisions.  A negative result may occur with  improper specimen collection / handling, submission of specimen other  than nasopharyngeal swab, presence of viral mutation(s) within the  areas targeted by this assay, and inadequate number of viral copies  (<250 copies / mL). A negative result must be combined with clinical  observations, patient history, and epidemiological information. If result is POSITIVE SARS-CoV-2 target nucleic acids are DETECTED. The SARS-CoV-2 RNA is generally detectable in upper and lower  respiratory specimens dur ing the acute phase of infection.  Positive  results are indicative of active infection with SARS-CoV-2.  Clinical  correlation with patient history and other diagnostic information is  necessary to determine patient infection status.  Positive results do  not rule out bacterial infection or co-infection with other viruses. If result is PRESUMPTIVE POSTIVE SARS-CoV-2 nucleic acids MAY BE PRESENT.   A presumptive positive result was obtained on the submitted specimen  and confirmed on repeat testing.  While 2019 novel coronavirus  (SARS-CoV-2) nucleic acids may be present in the submitted sample  additional confirmatory testing may be necessary for epidemiological  and / or clinical management purposes  to differentiate between  SARS-CoV-2 and other Sarbecovirus currently known to infect humans.  If clinically indicated additional testing with an alternate test  methodology [phone]) is advised. The SARS-CoV-2 RNA is generally  detectable in upper and lower respiratory sp ecimens during the acute  phase of infection. The expected result is Negative. Fact Sheet for Patients:  StrictlyIdeas.no Fact Sheet for Healthcare Providers: BankingDealers.co.za This test is not yet approved or cleared by the [place]  FDA and has been authorized for detection and/or diagnosis of SARS-CoV-2 by FDA under an Emergency Use Authorization (EUA).  This EUA will remain in effect (meaning this test can be used) for the duration of the COVID-19 declaration under Section 564(b)(1) of the Act, 21 U.S.C. section 360bbb-3(b)(1), unless the authorization is terminated or revoked sooner. Performed at [facility], [place], [place]  [address]     Ct Abdomen Pelvis Wo Contrast  Result Date: 05/10/2019 CLINICAL DATA:  Nausea, vomiting EXAM: CT ABDOMEN AND PELVIS WITHOUT CONTRAST TECHNIQUE: Multidetector CT imaging of the abdomen and pelvis was performed following the standard protocol without IV contrast. COMPARISON:  CT chest abdomen pelvis, 05/17/2017 FINDINGS: Examination is generally limited by lack of intravenous contrast, breath motion artifact, and streak artifact from patient arm positioning. Lower chest: Small bilateral pleural effusions with associated heterogeneous and ground-glass airspace opacity. Stable, benign small pulmonary nodule of the left lung base. Cardiomegaly. Hepatobiliary: No solid liver abnormality is seen. No gallstones, gallbladder wall thickening, or biliary dilatation. Pancreas: Unremarkable. No pancreatic ductal dilatation or surrounding inflammatory changes. Spleen: Normal in size without significant abnormality. Adrenals/Urinary Tract: Adrenal glands are unremarkable. Unchanged perinephric fat stranding. Kidneys are otherwise normal, without renal calculi, solid lesion, or hydronephrosis. Bladder is unremarkable. Urinary bladder is distended. Stomach/Bowel: Stomach is within normal limits. Appendix appears normal. No evidence of bowel wall thickening, distention, or inflammatory changes. Moderate burden of stool in the colon. Vascular/Lymphatic: Aortic atherosclerosis. No enlarged abdominal or pelvic lymph nodes. Reproductive: No mass or other significant abnormality. Other:  Anasarca.  No abdominopelvic ascites. Musculoskeletal: No acute or significant osseous findings. Multiple nonacute fractures of the ribs. IMPRESSION: 1. Examination is generally limited by lack of intravenous contrast, breath motion artifact, and streak artifact from patient arm positioning. Within this limitation, no acute CT noncontrast findings of the abdomen or pelvis to explain vomiting. 2.  Distended urinary bladder.  Correlate for urinary retention. 3. Small bilateral pleural effusions with associated heterogeneous and ground-glass airspace opacity. 4.  Anasarca. 5.  Other chronic and incidental findings as detailed above. Electronically Signed   By: Eddie Candle M.D.   On: 05/10/2019 11:13   Ct Head Wo Contrast  Result Date: 05/09/2019 CLINICAL DATA:  Head trauma.  Mechanical fall with right eye injury. EXAM: CT HEAD WITHOUT CONTRAST CT MAXILLOFACIAL WITHOUT CONTRAST CT CERVICAL SPINE WITHOUT CONTRAST TECHNIQUE: Multidetector CT imaging of the head, cervical spine, and maxillofacial structures were performed using the standard protocol without intravenous contrast. Multiplanar CT image reconstructions of the cervical spine and maxillofacial structures were also generated. COMPARISON:  Head CT 04/24/2019 FINDINGS: CT HEAD FINDINGS Brain: There is no mass, hemorrhage or extra-axial collection. There is generalized atrophy without lobar predilection. Areas of hypoattenuation of the deep gray nuclei and confluent periventricular white matter hypodensity, consistent with chronic small vessel disease. Vascular: No abnormal hyperdensity of the major intracranial arteries or dural venous sinuses. No intracranial atherosclerosis. Skull: Intermediate sized right periorbital hematoma. No skull fracture. CT MAXILLOFACIAL FINDINGS Osseous: --Complex facial fracture types: No LeFort, zygomaticomaxillary complex or nasoorbitoethmoidal fracture. --Simple fracture types: None. --Mandible: No fracture or dislocation.  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 There is a right periorbital hematoma. The right globe is normal. Status post lens replacement. Sinuses: No fluid levels or advanced mucosal thickening. Soft tissues: Right periorbital hematoma CT CERVICAL SPINE FINDINGS Alignment: No static subluxation. Facets are aligned. Occipital condyles and the lateral masses of C1-C2 are aligned. Skull base and vertebrae: No acute fracture. Soft tissues and spinal canal: No prevertebral fluid or swelling. No visible canal hematoma. Disc levels: C6-7 disc space narrowing and uncovertebral hypertrophy. Upper chest: No pneumothorax, pulmonary nodule or pleural effusion. Other: Normal visualized paraspinal cervical soft tissues. IMPRESSION: 1. No acute intracranial abnormality. 2. No fracture or static subluxation of the cervical spine. 3. Large right periorbital hematoma without orbital injury or skull fracture. Electronically  Signed   By: Ulyses Jarred M.D.   On: 05/09/2019 19:48   Ct Cervical Spine Wo Contrast  Result Date: 05/09/2019 CLINICAL DATA:  Head trauma.  Mechanical fall with right eye injury. EXAM: CT HEAD WITHOUT CONTRAST CT MAXILLOFACIAL WITHOUT CONTRAST CT CERVICAL SPINE WITHOUT CONTRAST TECHNIQUE: Multidetector CT imaging of the head, cervical spine, and maxillofacial structures were performed using the standard protocol without intravenous contrast. Multiplanar CT image reconstructions of the cervical spine and maxillofacial structures were also generated. COMPARISON:  Head CT 04/24/2019 FINDINGS: CT HEAD FINDINGS Brain: There is no mass, hemorrhage or extra-axial collection. There is generalized atrophy without lobar predilection. Areas of hypoattenuation of the deep gray nuclei and confluent periventricular white matter hypodensity, consistent with chronic small vessel disease. Vascular: No abnormal hyperdensity of the major intracranial arteries or dural venous sinuses. No intracranial atherosclerosis.  Skull: Intermediate sized right periorbital hematoma. No skull fracture. CT MAXILLOFACIAL FINDINGS Osseous: --Complex facial fracture types: No LeFort, zygomaticomaxillary complex or nasoorbitoethmoidal fracture. --Simple fracture types: None. --Mandible: No fracture or dislocation. 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 There is a right periorbital hematoma. The right globe is normal. Status post lens replacement. Sinuses: No fluid levels or advanced mucosal thickening. Soft tissues: Right periorbital hematoma CT CERVICAL SPINE FINDINGS Alignment: No static subluxation. Facets are aligned. Occipital condyles and the lateral masses of C1-C2 are aligned. Skull base and vertebrae: No acute fracture. Soft tissues and spinal canal: No prevertebral fluid or swelling. No visible canal hematoma. Disc levels: C6-7 disc space narrowing and uncovertebral hypertrophy. Upper chest: No pneumothorax, pulmonary nodule or pleural effusion. Other: Normal visualized paraspinal cervical soft tissues. IMPRESSION: 1. No acute intracranial abnormality. 2. No fracture or static subluxation of the cervical spine. 3. Large right periorbital hematoma without orbital injury or skull fracture. Electronically Signed   By: Ulyses Jarred M.D.   On: 05/09/2019 19:48   Ct Maxillofacial Wo Contrast  Result Date: 05/09/2019 CLINICAL DATA:  Head trauma.  Mechanical fall with right eye injury. EXAM: CT HEAD WITHOUT CONTRAST CT MAXILLOFACIAL WITHOUT CONTRAST CT CERVICAL SPINE WITHOUT CONTRAST TECHNIQUE: Multidetector CT imaging of the head, cervical spine, and maxillofacial structures were performed using the standard protocol without intravenous contrast. Multiplanar CT image reconstructions of the cervical spine and maxillofacial structures were also generated. COMPARISON:  Head CT 04/24/2019 FINDINGS: CT HEAD FINDINGS Brain: There is no mass, hemorrhage or extra-axial collection. There is generalized atrophy without lobar predilection.  Areas of hypoattenuation of the deep gray nuclei and confluent periventricular white matter hypodensity, consistent with chronic small vessel disease. Vascular: No abnormal hyperdensity of the major intracranial arteries or dural venous sinuses. No intracranial atherosclerosis. Skull: Intermediate sized right periorbital hematoma. No skull fracture. CT MAXILLOFACIAL FINDINGS Osseous: --Complex facial fracture types: No LeFort, zygomaticomaxillary complex or nasoorbitoethmoidal fracture. --Simple fracture types: None. --Mandible: No fracture or dislocation. 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 There is a right periorbital hematoma. The right globe is normal. Status post lens replacement. Sinuses: No fluid levels or advanced mucosal thickening. Soft tissues: Right periorbital hematoma CT CERVICAL SPINE FINDINGS Alignment: No static subluxation. Facets are aligned. Occipital condyles and the lateral masses of C1-C2 are aligned. Skull base and vertebrae: No acute fracture. Soft tissues and spinal canal: No prevertebral fluid or swelling. No visible canal hematoma. Disc levels: C6-7 disc space narrowing and uncovertebral hypertrophy. Upper chest: No pneumothorax, pulmonary nodule or pleural effusion. Other: Normal visualized paraspinal cervical soft tissues. IMPRESSION: 1. No acute intracranial abnormality. 2. No fracture  or static subluxation of the cervical spine. 3. Large right periorbital hematoma without orbital injury or skull fracture. Electronically Signed   By: Ulyses Jarred M.D.   On: 05/09/2019 19:48

## 2019-05-11 NOTE — Progress Notes (Signed)
[facility]  ROUNDING NOTE   Subjective:   More alert and oriented this morning.   NS at 54mL/hr  Objective:  Vital signs in last 24 hours:  Temp:  [98 F (36.7 C)-98.6 F (37 C)] 98.1 F (36.7 C) (08/05 0937) Pulse Rate:  [53-67] 58 (08/05 0937) Resp:  [18-20] 18 (08/05 0937) BP: (132-188)/(60-99) 132/60 (08/05 0937) SpO2:  [99 %-100 %] 99 % (08/05 0937)  Weight change:  Filed Weights   05/09/19 1857  Weight: 68 kg    Intake/Output: I/O last 3 completed shifts: In: 790 [P.O.:240; I.V.:209; Blood:390] Out: 200 [Urine:200]   Intake/Output this shift:  No intake/output data recorded.  Physical Exam: General: NAD, laying in bed  Head: Normocephalic, atraumatic. Moist oral mucosal membranes  Eyes: Anicteric, PERRL  Neck: Supple, trachea midline  Lungs:  Clear to auscultation  Heart: Regular rate and rhythm  Abdomen:  Soft, nontender  Extremities: no peripheral edema.  Neurologic: Alert and oriented x 3.   Skin: No lesions  Access: none    Basic Metabolic Panel: Recent Labs  Lab 05/09/19 1907 05/10/19 0405  NA 135 141  K 5.0 5.0  CL 111 115*  CO2 19* 22  GLUCOSE 165* 81  BUN 54* 53*  CREATININE 3.96* 3.95*  CALCIUM 7.6* 7.8*    Liver Function Tests: No results for input(s): AST, ALT, ALKPHOS, BILITOT, PROT, ALBUMIN in the last 168 hours. No results for input(s): LIPASE, AMYLASE in the last 168 hours. Recent Labs  Lab 05/10/19 1529  AMMONIA 19    CBC: Recent Labs  Lab 05/09/19 1907 05/10/19 0405 05/10/19 1814  WBC 4.7 5.3 8.2  NEUTROABS 2.9  --   --   HGB 7.6* 8.6* 8.8*  HCT 24.1* 26.9* 27.8*  MCV 91.6 89.7 93.0  PLT 141* 137* 136*    Cardiac Enzymes: No results for input(s): CKTOTAL, CKMB, CKMBINDEX, TROPONINI in the last 168 hours.  BNP: Invalid input(s): POCBNP  CBG: Recent Labs  Lab 05/10/19 0738 05/10/19 1136 05/10/19 1710 05/10/19 2108 05/11/19 0748  GLUCAP 87 96 122* 113* 77    Microbiology: Results  for orders placed or performed during the hospital encounter of 05/09/19  SARS Coronavirus 2 [facility] order, Performed in [place] hospital lab)     Status: None   Collection Time: 05/09/19  9:28 PM  Result Value Ref Range Status   SARS Coronavirus 2 NEGATIVE NEGATIVE Final    Comment: (NOTE) If result is NEGATIVE SARS-CoV-2 target nucleic acids are NOT DETECTED. The SARS-CoV-2 RNA is generally detectable in upper and lower  respiratory specimens during the acute phase of infection. The lowest  concentration of SARS-CoV-2 viral copies this assay can detect is 250  copies / mL. A negative result does not preclude SARS-CoV-2 infection  and should not be used as the sole basis for treatment or other  patient management decisions.  A negative result may occur with  improper specimen collection / handling, submission of specimen other  than nasopharyngeal swab, presence of viral mutation(s) within the  areas targeted by this assay, and inadequate number of viral copies  (<250 copies / mL). A negative result must be combined with clinical  observations, patient history, and epidemiological information. If result is POSITIVE SARS-CoV-2 target nucleic acids are DETECTED. The SARS-CoV-2 RNA is generally detectable in upper and lower  respiratory specimens dur ing the acute phase of infection.  Positive  results are indicative of active infection with SARS-CoV-2.  Clinical  correlation with patient  history and other diagnostic information is  necessary to determine patient infection status.  Positive results do  not rule out bacterial infection or co-infection with other viruses. If result is PRESUMPTIVE POSTIVE SARS-CoV-2 nucleic acids MAY BE PRESENT.   A presumptive positive result was obtained on the submitted specimen  and confirmed on repeat testing.  While 2019 novel coronavirus  (SARS-CoV-2) nucleic acids may be present in the submitted sample  additional confirmatory testing may be  necessary for epidemiological  and / or clinical management purposes  to differentiate between  SARS-CoV-2 and other Sarbecovirus currently known to infect humans.  If clinically indicated additional testing with an alternate test  methodology [phone]) is advised. The SARS-CoV-2 RNA is generally  detectable in upper and lower respiratory sp ecimens during the acute  phase of infection. The expected result is Negative. Fact Sheet for Patients:  StrictlyIdeas.no Fact Sheet for Healthcare Providers: BankingDealers.co.za This test is not yet approved or cleared by the [place] FDA and has been authorized for detection and/or diagnosis of SARS-CoV-2 by FDA under an Emergency Use Authorization (EUA).  This EUA will remain in effect (meaning this test can be used) for the duration of the COVID-19 declaration under Section 564(b)(1) of the Act, 21 U.S.C. section 360bbb-3(b)(1), unless the authorization is terminated or revoked sooner. Performed at [facility], [address] [place], [place] [address]     Coagulation Studies: Recent Labs    05/09/19 1907  LABPROT 15.3*  INR 1.2    Urinalysis: No results for input(s): COLORURINE, LABSPEC, PHURINE, GLUCOSEU, HGBUR, BILIRUBINUR, KETONESUR, PROTEINUR, UROBILINOGEN, NITRITE, LEUKOCYTESUR in the last 72 hours.  Invalid input(s): APPERANCEUR    Imaging: Ct Abdomen Pelvis Wo Contrast  Result Date: 05/10/2019 CLINICAL DATA:  Nausea, vomiting EXAM: CT ABDOMEN AND PELVIS WITHOUT CONTRAST TECHNIQUE: Multidetector CT imaging of the abdomen and pelvis was performed following the standard protocol without IV contrast. COMPARISON:  CT chest abdomen pelvis, 05/17/2017 FINDINGS: Examination is generally limited by lack of intravenous contrast, breath motion artifact, and streak artifact from patient arm positioning. Lower chest: Small bilateral pleural effusions with associated  heterogeneous and ground-glass airspace opacity. Stable, benign small pulmonary nodule of the left lung base. Cardiomegaly. Hepatobiliary: No solid liver abnormality is seen. No gallstones, gallbladder wall thickening, or biliary dilatation. Pancreas: Unremarkable. No pancreatic ductal dilatation or surrounding inflammatory changes. Spleen: Normal in size without significant abnormality. Adrenals/Urinary Tract: Adrenal glands are unremarkable. Unchanged perinephric fat stranding. Kidneys are otherwise normal, without renal calculi, solid lesion, or hydronephrosis. Bladder is unremarkable. Urinary bladder is distended. Stomach/Bowel: Stomach is within normal limits. Appendix appears normal. No evidence of bowel wall thickening, distention, or inflammatory changes. Moderate burden of stool in the colon. Vascular/Lymphatic: Aortic atherosclerosis. No enlarged abdominal or pelvic lymph nodes. Reproductive: No mass or other significant abnormality. Other: Anasarca.  No abdominopelvic ascites. Musculoskeletal: No acute or significant osseous findings. Multiple nonacute fractures of the ribs. IMPRESSION: 1. Examination is generally limited by lack of intravenous contrast, breath motion artifact, and streak artifact from patient arm positioning. Within this limitation, no acute CT noncontrast findings of the abdomen or pelvis to explain vomiting. 2.  Distended urinary bladder.  Correlate for urinary retention. 3. Small bilateral pleural effusions with associated heterogeneous and ground-glass airspace opacity. 4.  Anasarca. 5.  Other chronic and incidental findings as detailed above. Electronically Signed   By: Eddie Candle M.D.   On: 05/10/2019 11:13   Ct Head Wo Contrast  Result Date: 05/09/2019 CLINICAL DATA:  Head trauma.  Mechanical fall with right eye injury. EXAM: CT HEAD WITHOUT CONTRAST CT MAXILLOFACIAL WITHOUT CONTRAST CT CERVICAL SPINE WITHOUT CONTRAST TECHNIQUE: Multidetector CT imaging of the head, cervical  spine, and maxillofacial structures were performed using the standard protocol without intravenous contrast. Multiplanar CT image reconstructions of the cervical spine and maxillofacial structures were also generated. COMPARISON:  Head CT 04/24/2019 FINDINGS: CT HEAD FINDINGS Brain: There is no mass, hemorrhage or extra-axial collection. There is generalized atrophy without lobar predilection. Areas of hypoattenuation of the deep gray nuclei and confluent periventricular white matter hypodensity, consistent with chronic small vessel disease. Vascular: No abnormal hyperdensity of the major intracranial arteries or dural venous sinuses. No intracranial atherosclerosis. Skull: Intermediate sized right periorbital hematoma. No skull fracture. CT MAXILLOFACIAL FINDINGS Osseous: --Complex facial fracture types: No LeFort, zygomaticomaxillary complex or nasoorbitoethmoidal fracture. --Simple fracture types: None. --Mandible: No fracture or dislocation. 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 There is a right periorbital hematoma. The right globe is normal. Status post lens replacement. Sinuses: No fluid levels or advanced mucosal thickening. Soft tissues: Right periorbital hematoma CT CERVICAL SPINE FINDINGS Alignment: No static subluxation. Facets are aligned. Occipital condyles and the lateral masses of C1-C2 are aligned. Skull base and vertebrae: No acute fracture. Soft tissues and spinal canal: No prevertebral fluid or swelling. No visible canal hematoma. Disc levels: C6-7 disc space narrowing and uncovertebral hypertrophy. Upper chest: No pneumothorax, pulmonary nodule or pleural effusion. Other: Normal visualized paraspinal cervical soft tissues. IMPRESSION: 1. No acute intracranial abnormality. 2. No fracture or static subluxation of the cervical spine. 3. Large right periorbital hematoma without orbital injury or skull fracture. Electronically Signed   By: Ulyses Jarred M.D.   On: 05/09/2019 19:48   Ct  Cervical Spine Wo Contrast  Result Date: 05/09/2019 CLINICAL DATA:  Head trauma.  Mechanical fall with right eye injury. EXAM: CT HEAD WITHOUT CONTRAST CT MAXILLOFACIAL WITHOUT CONTRAST CT CERVICAL SPINE WITHOUT CONTRAST TECHNIQUE: Multidetector CT imaging of the head, cervical spine, and maxillofacial structures were performed using the standard protocol without intravenous contrast. Multiplanar CT image reconstructions of the cervical spine and maxillofacial structures were also generated. COMPARISON:  Head CT 04/24/2019 FINDINGS: CT HEAD FINDINGS Brain: There is no mass, hemorrhage or extra-axial collection. There is generalized atrophy without lobar predilection. Areas of hypoattenuation of the deep gray nuclei and confluent periventricular white matter hypodensity, consistent with chronic small vessel disease. Vascular: No abnormal hyperdensity of the major intracranial arteries or dural venous sinuses. No intracranial atherosclerosis. Skull: Intermediate sized right periorbital hematoma. No skull fracture. CT MAXILLOFACIAL FINDINGS Osseous: --Complex facial fracture types: No LeFort, zygomaticomaxillary complex or nasoorbitoethmoidal fracture. --Simple fracture types: None. --Mandible: No fracture or dislocation. 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 There is a right periorbital hematoma. The right globe is normal. Status post lens replacement. Sinuses: No fluid levels or advanced mucosal thickening. Soft tissues: Right periorbital hematoma CT CERVICAL SPINE FINDINGS Alignment: No static subluxation. Facets are aligned. Occipital condyles and the lateral masses of C1-C2 are aligned. Skull base and vertebrae: No acute fracture. Soft tissues and spinal canal: No prevertebral fluid or swelling. No visible canal hematoma. Disc levels: C6-7 disc space narrowing and uncovertebral hypertrophy. Upper chest: No pneumothorax, pulmonary nodule or pleural effusion. Other: Normal visualized paraspinal  cervical soft tissues. IMPRESSION: 1. No acute intracranial abnormality. 2. No fracture or static subluxation of the cervical spine. 3. Large right periorbital hematoma without orbital injury or skull fracture. Electronically Signed   By: Lennette Bihari  Collins [place] M.D.   On: 05/09/2019 19:48   Ct Maxillofacial Wo Contrast  Result Date: 05/09/2019 CLINICAL DATA:  Head trauma.  Mechanical fall with right eye injury. EXAM: CT HEAD WITHOUT CONTRAST CT MAXILLOFACIAL WITHOUT CONTRAST CT CERVICAL SPINE WITHOUT CONTRAST TECHNIQUE: Multidetector CT imaging of the head, cervical spine, and maxillofacial structures were performed using the standard protocol without intravenous contrast. Multiplanar CT image reconstructions of the cervical spine and maxillofacial structures were also generated. COMPARISON:  Head CT 04/24/2019 FINDINGS: CT HEAD FINDINGS Brain: There is no mass, hemorrhage or extra-axial collection. There is generalized atrophy without lobar predilection. Areas of hypoattenuation of the deep gray nuclei and confluent periventricular white matter hypodensity, consistent with chronic small vessel disease. Vascular: No abnormal hyperdensity of the major intracranial arteries or dural venous sinuses. No intracranial atherosclerosis. Skull: Intermediate sized right periorbital hematoma. No skull fracture. CT MAXILLOFACIAL FINDINGS Osseous: --Complex facial fracture types: No LeFort, zygomaticomaxillary complex or nasoorbitoethmoidal fracture. --Simple fracture types: None. --Mandible: No fracture or dislocation. 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 There is a right periorbital hematoma. The right globe is normal. Status post lens replacement. Sinuses: No fluid levels or advanced mucosal thickening. Soft tissues: Right periorbital hematoma CT CERVICAL SPINE FINDINGS Alignment: No static subluxation. Facets are aligned. Occipital condyles and the lateral masses of C1-C2 are aligned. Skull base and vertebrae: No  acute fracture. Soft tissues and spinal canal: No prevertebral fluid or swelling. No visible canal hematoma. Disc levels: C6-7 disc space narrowing and uncovertebral hypertrophy. Upper chest: No pneumothorax, pulmonary nodule or pleural effusion. Other: Normal visualized paraspinal cervical soft tissues. IMPRESSION: 1. No acute intracranial abnormality. 2. No fracture or static subluxation of the cervical spine. 3. Large right periorbital hematoma without orbital injury or skull fracture. Electronically Signed   By: Ulyses Jarred M.D.   On: 05/09/2019 19:48     Medications:   . sodium chloride    . sodium chloride 50 mL/hr at 05/10/19 0300   . amLODipine  10 mg Oral Daily  . aspirin EC  81 mg Oral Daily  . atorvastatin  40 mg Oral QHS  . citalopram  20 mg Oral Daily  . clopidogrel  75 mg Oral Daily  . divalproex  500 mg Oral BID  . glipiZIDE  2.5 mg Oral BID  . insulin aspart  0-15 Units Subcutaneous TID WC  . insulin aspart  0-5 Units Subcutaneous QHS  . levothyroxine  125 mcg Oral QAC breakfast  . lipase/protease/amylase  12,000 Units Oral TID WC  . metoprolol tartrate  25 mg Oral BID  . pantoprazole (PROTONIX) IV  40 mg Intravenous Q12H  . sodium bicarbonate  650 mg Oral BID  . tamsulosin  0.4 mg Oral Daily   acetaminophen **OR** acetaminophen, hydrALAZINE, ondansetron **OR** ondansetron (ZOFRAN) IV, polyethylene glycol, promethazine  Assessment/ Plan:  [name] is a 68 y.o. black male withLeft eye prosthesis, diabetes mellitus type 2, hypertension, syncope, and chronic kidney disease stage IV followed by [facility], who was admitted to [facility] on 05/09/[facility] for fall and possible seizure.   1. Acute renal failure on Chronic kidney disease stage IV with hyperkalemia and metabolic acidosis  Follows with [facility].  History of acute renal failure requiring hemodialylsis for several months. Has been off dialysis since 10/18/18.  Chronic kidney disease secondary to  diabetic nephropathy and hypertension Acute renal failure secondary to prerenal azotemia and acute blood loss.  No indication for dialysis - Continue IV fluids - Continue PO  bicarbonate.  2. Hypotension: with systolic and diastolic congestive heart failure - Continue amlodipine - Holding furosemide.   3. Anemia with chronic kidney disease/renal failure: with acute blood loss anemia. Status post PRBC transfusion on 8/3.  Hemoglobin 8.8   LOS: 2 Cameron Adams 8/5/202011:08 AM

## 2019-05-11 NOTE — Op Note (Signed)
[facility]astroenterology Patient Name: Cameron Adams Procedure Date: 05/11/2019 1:56 PM MRN: [id number] Account #: [id number] Date of Birth: January 31, 1951 Admit Type: Outpatient Age: 68 Room: [facility] ENDO ROOM 3 Gender: Male Note Status: Finalized Procedure:            Upper GI endoscopy Indications:          Iron deficiency anemia, Coffee-ground emesis Providers:            Benay Pike. Niyanna Asch MD, MD Medicines:            Propofol per Anesthesia Complications:        No immediate complications. Procedure:            Pre-Anesthesia Assessment:                       - After reviewing the risks and benefits, the patient                        was deemed in satisfactory condition to undergo the                        procedure.                       - Patient identification and proposed procedure were                        verified prior to the procedure by the nurse. The                        procedure was verified in the procedure room.                       - The risks and benefits of the procedure and the                        sedation options and risks were discussed with the                        patient. All questions were answered and informed                        consent was obtained.                       - ASA Grade Assessment: III - A patient with severe                        systemic disease.                       After obtaining informed consent, the endoscope was                        passed under direct vision. Throughout the procedure,                        the patient's blood pressure, pulse, and oxygen                        saturations were monitored continuously. The Endoscope  was introduced through the mouth, and advanced to the                        third part of duodenum. The upper GI endoscopy was                        accomplished without difficulty. The patient tolerated                        the procedure  well. Findings:      A 7 mm non-bleeding Mallory-Weiss tear with no stigmata of recent       bleeding was found.      A 7 mm non-bleeding Mallory-Weiss tear with no stigmata of recent       bleeding was found.      There is no endoscopic evidence of bleeding, erythema, erythema or       inflammatory changes suggestive of gastritis, hiatal hernia,       inflammation, ulceration or mass in the entire examined stomach.      The examined duodenum was normal.      The exam was otherwise without abnormality.      A large amount of food (residue) was found in the gastric fundus and in       the gastric antrum. Impression:           - Mallory-Weiss tear.                       - Mallory-Weiss tear.                       - Normal examined duodenum.                       - The examination was otherwise normal.                       - No specimens collected. Recommendation:       - Return patient to hospital ward for ongoing care.                       - NPO until mental status improves, then diet as                        tolerated.                       - GI sign off, call back if we can help. 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 All rights reserved. The codes documented in this report are preliminary and upon coder review may  be revised to meet current compliance requirements. Efrain Sella MD, MD 05/11/2019 2:33:00 PM This report has been signed electronically. Number of Addenda:  0 Note Initiated On: 05/11/2019 1:56 PM Estimated Blood Loss: Estimated blood loss: none. Estimated blood loss: none.      [facility]

## 2019-05-11 NOTE — Transfer of Care (Signed)
Immediate Anesthesia Transfer of Care Note  Patient: Cameron Adams  Procedure(s) Performed: ESOPHAGOGASTRODUODENOSCOPY (EGD) (N/A )  Patient Location: Endoscopy Unit  Anesthesia Type:General  Level of Consciousness: patient cooperative, confused and responds to stimulation  Airway & Oxygen Therapy: Patient Spontanous Breathing and Patient connected to face mask oxygen  Post-op Assessment: Report given to RN and Post -op Vital signs reviewed and stable  Post vital signs: Reviewed and stable  Last Vitals:  Vitals Value Taken Time  BP 111/50 05/11/19 1437  Temp 36.4 C 05/11/19 1436  Pulse 48 05/11/19 1438  Resp 15 05/11/19 1438  SpO2 100 % 05/11/19 1438  Vitals shown include unvalidated device data.  Last Pain:  Vitals:   05/11/19 1357  TempSrc: Oral  PainSc: 0-No pain         Complications: No apparent anesthesia complications

## 2019-05-11 NOTE — H&P (View-Only) (Signed)
GI Inpatient Consult Note  Reason for Consult: Hematemesis    Attending Requesting Consult: Dr. Max Sane, MD  History of Present Illness: Cameron Adams is a 68 y.o. male seen for evaluation of hematemesis at the request of Dr. Max Sane, MD. Pt has a PMH of diabetes, anemia of chronic disease, CKD Stage IV followed by [facility] Neph[facility], distolic CHF, Hx of seizures, hypothyroidism, and hx of urinary retention. He presented to the ED on Monday after sustaining a fall while attempting to get into his wheelchair. There was apparently no LOC. He is on dual antiplatelet therapy with aspirin and Plavix. He was found to have acute blood loss anemia with a drop in hemoglobin from 9 to 7.6 on presentation to the ED. Labs completed in the emergency room demonstrate BUN of 54 and creatinine 3.96 with hemoglobin 7.6 and hematocrit 24.1 and platelet count 141.  CT face is negative for acute findings or fractures. He had negative hemoccult in the ED and received 1 unit pRBCs. Pt apparently had several episodes of coffee-ground emesis yesterday. Hemoglobin yesterday morning was 8.8. but has not been rechecked this morning. Patient is lethargic and does not answer questions thoroughly. There have been episodes of hematemesis, hematochezia, or melena since admission. CT abd/pelvis with IV contrast was negative for any acute intra-abdominal pathology. I have called and spoken with the patient's two daughters - [place] and [place]. They report that patient has never had an EGD or colonoscopy. He apparently has been having diarrhea - 3-4 loose BMs daily over the past couple years. It is reportedly after he eats. Imodium has provided minimal relief. There is no apparent hematochezia or melena. They report patient was supposed to undergo a stress test, but this has not been done because he was hospitalized. Reportedly has been on Plavix for the past 4 months 2/2 TIA.    Last Colonoscopy: N/A Last Endoscopy:  N/A   Past Medical History:  Past Medical History:  Diagnosis Date  . Anemia of chronic disease   . Blindness of left eye   . Chronic kidney disease, stage III (moderate) (HCC)   . Diabetes ([place])   . Diarrhea   . Diastolic dysfunction    a. 06/2017 Echo: EF 55-60%, no rwma, Gr2 DD, Asc Ao 3.9cm, nl RV fxn, nl PASP.  Marland Kitchen Hypertension   . Hypothyroidism   . Monoclonal gammopathy of unknown significance (MGUS)    a. prev seen @ [facility].  Lost to f/u - never had BM bx.  . Orthostatic hypotension   . Syncope and collapse     Problem List: Patient Active Problem List   Diagnosis Date Noted  . Dehydration 05/09/2019  . Confusion   . Visual hallucination   . Altered mental status 04/24/2019  . Syncope and collapse 01/12/2019  . Pancreatic insufficiency 12/28/2018  . Chronic systolic CHF (congestive heart failure) ([place]) 12/13/2018  . History of TIA (transient ischemic attack) 12/13/2018  . Weakness   . AKI (acute kidney injury) ([place]) 12/04/2018  . Arm wound, right, initial encounter 07/21/2018  . Wound infection 07/21/2018  . Orthostatic hypotension dysautonomic syndrome ([place]) 07/07/2018  . Acute renal failure superimposed on stage 3 chronic kidney disease ([place]) 12/09/2017  . Dizziness 06/22/2017  . Hyperkalemia 06/16/2017  . Esophageal dysmotilities   . Dysphagia   . Rib fractures 05/17/2017  . Hemothorax, right 05/17/2017  . Recurrent major depressive disorder, in partial remission ([place]) 05/13/2017  . Pure hypercholesterolemia 05/13/2017  . IDDM (insulin dependent diabetes  mellitus) ([place]) 05/13/2017  . Hypothyroidism (acquired) 05/13/2017  . GAD (generalized anxiety disorder) 05/13/2017  . CKD (chronic kidney disease) stage 3, GFR 30-59 ml/min (HCC) 05/13/2017  . Toe gangrene ([place]) 09/16/2016  . PVD (peripheral vascular disease) ([place]) 09/16/2016  . Uncontrolled diabetes mellitus ([place]) 09/16/2016  . Hyponatremia 09/16/2016  . Hypokalemia 09/16/2016  . Essential hypertension,  malignant 09/16/2016  . Abnormal angiogram 09/16/2016  . Gangrene ([place]) 09/13/2016  . Pruritus 03/26/2016  . Orthostatic hypotension 03/26/2016  . 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 Alcohol abuse 03/26/2016  . Hyperglycemia 03/23/2016  . Passive suicidal ideations 09/16/2014  . MDD (major depressive disorder) 09/16/2014  . Dyspnea 11/09/2013  . CAP (community acquired pneumonia) 10/31/2013  . Chest pain 10/31/2013  . Chronic respiratory failure with hypoxia ([place]) 10/31/2013  . 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 Diabetic retinopathy ([place]) 06/16/2013  . Gynecomastia 04/18/2013    Past Surgical History: Past Surgical History:  Procedure Laterality Date  . 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 PERIPHERAL VASCULAR CATHETERIZATION N/A 09/15/2016   Procedure: Lower Extremity Angiography;  Surgeon: Algernon Huxley, MD;  Location: [facility];  Service: Cardiovascular;  Laterality: N/A;  . THYROID SURGERY      Allergies: Allergies  Allergen Reactions  . Gabapentin     Other reaction(s): Other (See Comments) Trouble sleeping     Home Medications: Medications Prior to Admission  Medication Sig Dispense Refill Last Dose  . acetaminophen (TYLENOL) 500 MG tablet Take 500-1,000 mg by mouth every 6 (six) hours as needed for mild pain, moderate pain or fever.   Unknown at PRN  . acidophilus (RISAQUAD) CAPS capsule Take 1 capsule by mouth daily.   05/09/2019 at 0900  . amLODipine (NORVASC) 10 MG tablet Take 1 tablet (10 mg total) by mouth daily.   05/09/2019 at 0900  . aspirin EC 81 MG tablet Take 81 mg by mouth daily.   05/09/2019 at 0900  . atorvastatin (LIPITOR) 40 MG tablet Take 40 mg by mouth at bedtime.   05/08/2019 at 2100  . Cholecalciferol (VITAMIN D-1000 MAX ST) 25 MCG (1000 UT) tablet Take 2,000 Units by mouth daily.    05/09/2019  at 0900  . citalopram (CELEXA) 20 MG tablet Take 20 mg by mouth daily.   05/09/2019 at 0900  . clopidogrel (PLAVIX) 75 MG tablet Take 1 tablet (75 mg total) by mouth daily. 90 tablet 0 05/09/2019 at 0900  . divalproex (DEPAKOTE ER) 500 MG 24 hr tablet Take 1 tablet (500 mg total) by mouth 2 (two) times daily. 60 tablet 0 05/09/2019 at 0900  . glipiZIDE (GLUCOTROL) 5 MG tablet Take 2.5 mg by mouth 2 (two) times daily.    05/09/2019 at 0900  . hydrOXYzine (ATARAX/VISTARIL) 10 MG tablet Take 1 tablet (10 mg total) by mouth every 8 (eight) hours as needed for anxiety. 30 tablet 0 Unknown at PRN  . levothyroxine (SYNTHROID, LEVOTHROID) 100 MCG tablet Take 1 tablet (100 mcg total) by mouth daily before breakfast. 30 tablet 0 05/09/2019 at 0630  . loperamide (IMODIUM) 2 MG capsule Take 1 capsule (2 mg total) by mouth every 6 (six) hours as needed for diarrhea or loose stools. 30 capsule 0 Unknown at PRN  . metoprolol tartrate (LOPRESSOR) 25 MG tablet Take 1 tablet (25 mg total) by mouth 2 (two) times daily. (Patient taking  differently: Take 37.5 mg by mouth 2 (two) times daily. )   05/09/2019 at 0800  . Multiple Vitamin (MULTIVITAMIN WITH MINERALS) TABS tablet Take 1 tablet by mouth daily.   05/09/2019 at 0900  . Pancrelipase, Lip-Prot-Amyl, (CREON) 24000-76000 units CPEP Take 48,000 Units by mouth 3 (three) times daily with meals.   05/09/2019 at 0800  . sodium bicarbonate 650 MG tablet Take 650 mg by mouth 2 (two) times daily.   05/09/2019 at 0900  . sulfamethoxazole-trimethoprim (BACTRIM DS) 800-160 MG tablet Take 1 tablet by mouth 2 (two) times daily.   05/09/2019 at 0800  . tamsulosin (FLOMAX) 0.4 MG CAPS capsule Take 1 capsule (0.4 mg total) by mouth daily. 30 capsule  05/09/2019 at 0900   Home medication reconciliation was completed with the patient.   Scheduled Inpatient Medications:   . amLODipine  10 mg Oral Daily  . aspirin EC  81 mg Oral Daily  . atorvastatin  40 mg Oral QHS  . citalopram  20 mg Oral Daily  .  clopidogrel  75 mg Oral Daily  . divalproex  500 mg Oral BID  . glipiZIDE  2.5 mg Oral BID  . insulin aspart  0-15 Units Subcutaneous TID WC  . insulin aspart  0-5 Units Subcutaneous QHS  . levothyroxine  125 mcg Oral QAC breakfast  . lipase/protease/amylase  12,000 Units Oral TID WC  . metoprolol tartrate  25 mg Oral BID  . pantoprazole (PROTONIX) IV  40 mg Intravenous Q12H  . sodium bicarbonate  650 mg Oral BID  . tamsulosin  0.4 mg Oral Daily    Continuous Inpatient Infusions:   . sodium chloride    . sodium chloride 50 mL/hr at 05/10/19 0300    PRN Inpatient Medications:  acetaminophen **OR** acetaminophen, hydrALAZINE, ondansetron **OR** ondansetron (ZOFRAN) IV, polyethylene glycol, promethazine  Family History: family history includes Heart disease in his brother and father; Kidney failure in his father and mother.  The patient's family history is negative for inflammatory bowel disorders, GI malignancy, or solid organ transplantation.  Social History:   reports that he quit smoking about 40 years ago. His smoking use included cigarettes. He has a 2.50 pack-year smoking history. He has never used smokeless tobacco. He reports previous alcohol use. He reports that he does not use drugs. The patient denies ETOH, tobacco, or drug use.   Review of Systems:  Unable to obtain due to patient's mental status    Physical Examination: BP 132/60 (BP Location: Left Arm)   Pulse (!) 58   Temp 98.1 F (36.7 C) (Oral)   Resp 18   Ht 5\' 11"  (1.803 m)   Wt 68 kg   SpO2 99%   BMI 20.92 kg/m   Lethargy, non-toxic appearing male in hospital bed Gen: NAD, alert and oriented to person HEENT: PEERLA, EOMI, Neck: supple, no JVD or thyromegaly Chest: CTA bilaterally, no wheezes, crackles, or other adventitious sounds CV: RRR, no m/g/c/r Abd: soft, NT, ND, hypoactive BS in all four quadrants; no HSM, guarding, ridigity, or rebound tenderness Ext: no edema, well perfused with 2+  pulses, Skin: no rash or lesions noted Lymph: no LAD  Data: Lab Results  Component Value Date   WBC 8.2 05/10/2019   HGB 8.8 (L) 05/10/2019   HCT 27.8 (L) 05/10/2019   MCV 93.0 05/10/2019   PLT 136 (L) 05/10/2019   Recent Labs  Lab 05/09/19 1907 05/10/19 0405 05/10/19 1814  HGB 7.6* 8.6* 8.8*   Lab Results  Component  Value Date   NA 141 05/10/2019   K 5.0 05/10/2019   CL 115 (H) 05/10/2019   CO2 22 05/10/2019   BUN 53 (H) 05/10/2019   CREATININE 3.95 (H) 05/10/2019   Lab Results  Component Value Date   ALT 14 04/24/2019   AST 27 04/24/2019   ALKPHOS 65 04/24/2019   BILITOT 0.5 04/24/2019   Recent Labs  Lab 05/09/19 1907  APTT 39*  INR 1.2   Assessment/Plan:  68 y/o AA male with PMH of CKD Stage IV, diastolic CHF, Hx of seizures, Hx of TIA on chronic dual antiplatelet therapy, hypothyroidism admitted 08/03 for AMS and acute blood loss anemia  1. Acute blood loss anemia  2. Hematemesis 3. CKD Stage IV 4. Chronic antiplatelet therapy - aspirin 81 mg and Plavix  - Concern for possible UGI source of bleeding. Differential includes esophagitis, Mallory-Weiss tear, Dielafoy's lesion, GAVE, AVMs, gastritis, PUD +/- H pylori, duodenitis, right colon source, malignancy - Drop in hemoglobin from baseline mid 9s to 7.6. He is s/p 1 unit of pRBCs.  - Hold all antiplatelet therapy. Per nursing, patient received Plavix this morning. - Agree with PPI - Continue to monitor H&H. Transfuse for Hgb <7.0. - Advise EGD for diagnostic and potential therapeutic intervention. I discussed procedure details and indications with patient's two daughters Beryle Flock and [place]) who have consented to the procedure. I reviewed the risks (including bleeding, perforation, infection, anesthesia complications, cardiac/respiratory complications), benefits and alternatives of EGD. Patient consents to proceed.  - If EGD is negative, consider colonoscopy - Further recommendations after  procedure   Thank you for the consult. Please call with questions or concerns.  Reeves Forth [facility] [phone] (Cell)

## 2019-05-11 NOTE — Consult Note (Signed)
GI Inpatient Consult Note  Reason for Consult: Hematemesis    Attending Requesting Consult: Dr. Max Sane, MD  History of Present Illness: Cameron Adams is a 68 y.o. male seen for evaluation of hematemesis at the request of Dr. Max Sane, MD. Pt has a PMH of diabetes, anemia of chronic disease, CKD Stage IV followed by [facility], distolic CHF, Hx of seizures, hypothyroidism, and hx of urinary retention. He presented to the ED on Monday after sustaining a fall while attempting to get into his wheelchair. There was apparently no LOC. He is on dual antiplatelet therapy with aspirin and Plavix. He was found to have acute blood loss anemia with a drop in hemoglobin from 9 to 7.6 on presentation to the ED. Labs completed in the emergency room demonstrate BUN of 54 and creatinine 3.96 with hemoglobin 7.6 and hematocrit 24.1 and platelet count 141.  CT face is negative for acute findings or fractures. He had negative hemoccult in the ED and received 1 unit pRBCs. Pt apparently had several episodes of coffee-ground emesis yesterday. Hemoglobin yesterday morning was 8.8. but has not been rechecked this morning. Patient is lethargic and does not answer questions thoroughly. There have been episodes of hematemesis, hematochezia, or melena since admission. CT abd/pelvis with IV contrast was negative for any acute intra-abdominal pathology. I have called and spoken with the patient's two daughters - [place] and [place]. They report that patient has never had an EGD or colonoscopy. He apparently has been having diarrhea - 3-4 loose BMs daily over the past couple years. It is reportedly after he eats. Imodium has provided minimal relief. There is no apparent hematochezia or melena. They report patient was supposed to undergo a stress test, but this has not been done because he was hospitalized. Reportedly has been on Plavix for the past 4 months 2/2 TIA.    Last Colonoscopy: N/A Last Endoscopy:  N/A   Past Medical History:  Past Medical History:  Diagnosis Date  . Anemia of chronic disease   . Blindness of left eye   . Chronic kidney disease, stage III (moderate) (HCC)   . Diabetes ([place])   . Diarrhea   . Diastolic dysfunction    a. 06/2017 Echo: EF 55-60%, no rwma, Gr2 DD, Asc Ao 3.9cm, nl RV fxn, nl PASP.  Marland Kitchen Hypertension   . Hypothyroidism   . Monoclonal gammopathy of unknown significance (MGUS)    a. prev seen @ [facility].  Lost to f/u - never had BM bx.  . Orthostatic hypotension   . Syncope and collapse     Problem List: Patient Active Problem List   Diagnosis Date Noted  . Dehydration 05/09/2019  . Confusion   . Visual hallucination   . Altered mental status 04/24/2019  . Syncope and collapse 01/12/2019  . Pancreatic insufficiency 12/28/2018  . Chronic systolic CHF (congestive heart failure) ([place]) 12/13/2018  . History of TIA (transient ischemic attack) 12/13/2018  . Weakness   . AKI (acute kidney injury) ([place]) 12/04/2018  . Arm wound, right, initial encounter 07/21/2018  . Wound infection 07/21/2018  . Orthostatic hypotension dysautonomic syndrome ([place]) 07/07/2018  . Acute renal failure superimposed on stage 3 chronic kidney disease ([place]) 12/09/2017  . Dizziness 06/22/2017  . Hyperkalemia 06/16/2017  . Esophageal dysmotilities   . Dysphagia   . Rib fractures 05/17/2017  . Hemothorax, right 05/17/2017  . Recurrent major depressive disorder, in partial remission ([place]) 05/13/2017  . Pure hypercholesterolemia 05/13/2017  . IDDM (insulin dependent diabetes  mellitus) ([place]) 05/13/2017  . Hypothyroidism (acquired) 05/13/2017  . GAD (generalized anxiety disorder) 05/13/2017  . CKD (chronic kidney disease) stage 3, GFR 30-59 ml/min (HCC) 05/13/2017  . Toe gangrene ([place]) 09/16/2016  . PVD (peripheral vascular disease) ([place]) 09/16/2016  . Uncontrolled diabetes mellitus ([place]) 09/16/2016  . Hyponatremia 09/16/2016  . Hypokalemia 09/16/2016  . Essential hypertension,  malignant 09/16/2016  . Abnormal angiogram 09/16/2016  . Gangrene ([place]) 09/13/2016  . Pruritus 03/26/2016  . Orthostatic hypotension 03/26/2016  . 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 Alcohol abuse 03/26/2016  . Hyperglycemia 03/23/2016  . Passive suicidal ideations 09/16/2014  . MDD (major depressive disorder) 09/16/2014  . Dyspnea 11/09/2013  . CAP (community acquired pneumonia) 10/31/2013  . Chest pain 10/31/2013  . Chronic respiratory failure with hypoxia ([place]) 10/31/2013  . 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 Diabetic retinopathy ([place]) 06/16/2013  . Gynecomastia 04/18/2013    Past Surgical History: Past Surgical History:  Procedure Laterality Date  . 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 PERIPHERAL VASCULAR CATHETERIZATION N/A 09/15/2016   Procedure: Lower Extremity Angiography;  Surgeon: Algernon Huxley, MD;  Location: [facility];  Service: Cardiovascular;  Laterality: N/A;  . THYROID SURGERY      Allergies: Allergies  Allergen Reactions  . Gabapentin     Other reaction(s): Other (See Comments) Trouble sleeping     Home Medications: Medications Prior to Admission  Medication Sig Dispense Refill Last Dose  . acetaminophen (TYLENOL) 500 MG tablet Take 500-1,000 mg by mouth every 6 (six) hours as needed for mild pain, moderate pain or fever.   Unknown at PRN  . acidophilus (RISAQUAD) CAPS capsule Take 1 capsule by mouth daily.   05/09/2019 at 0900  . amLODipine (NORVASC) 10 MG tablet Take 1 tablet (10 mg total) by mouth daily.   05/09/2019 at 0900  . aspirin EC 81 MG tablet Take 81 mg by mouth daily.   05/09/2019 at 0900  . atorvastatin (LIPITOR) 40 MG tablet Take 40 mg by mouth at bedtime.   05/08/2019 at 2100  . Cholecalciferol (VITAMIN D-1000 MAX ST) 25 MCG (1000 UT) tablet Take 2,000 Units by mouth daily.    05/09/2019  at 0900  . citalopram (CELEXA) 20 MG tablet Take 20 mg by mouth daily.   05/09/2019 at 0900  . clopidogrel (PLAVIX) 75 MG tablet Take 1 tablet (75 mg total) by mouth daily. 90 tablet 0 05/09/2019 at 0900  . divalproex (DEPAKOTE ER) 500 MG 24 hr tablet Take 1 tablet (500 mg total) by mouth 2 (two) times daily. 60 tablet 0 05/09/2019 at 0900  . glipiZIDE (GLUCOTROL) 5 MG tablet Take 2.5 mg by mouth 2 (two) times daily.    05/09/2019 at 0900  . hydrOXYzine (ATARAX/VISTARIL) 10 MG tablet Take 1 tablet (10 mg total) by mouth every 8 (eight) hours as needed for anxiety. 30 tablet 0 Unknown at PRN  . levothyroxine (SYNTHROID, LEVOTHROID) 100 MCG tablet Take 1 tablet (100 mcg total) by mouth daily before breakfast. 30 tablet 0 05/09/2019 at 0630  . loperamide (IMODIUM) 2 MG capsule Take 1 capsule (2 mg total) by mouth every 6 (six) hours as needed for diarrhea or loose stools. 30 capsule 0 Unknown at PRN  . metoprolol tartrate (LOPRESSOR) 25 MG tablet Take 1 tablet (25 mg total) by mouth 2 (two) times daily. (Patient taking  differently: Take 37.5 mg by mouth 2 (two) times daily. )   05/09/2019 at 0800  . Multiple Vitamin (MULTIVITAMIN WITH MINERALS) TABS tablet Take 1 tablet by mouth daily.   05/09/2019 at 0900  . Pancrelipase, Lip-Prot-Amyl, (CREON) 24000-76000 units CPEP Take 48,000 Units by mouth 3 (three) times daily with meals.   05/09/2019 at 0800  . sodium bicarbonate 650 MG tablet Take 650 mg by mouth 2 (two) times daily.   05/09/2019 at 0900  . sulfamethoxazole-trimethoprim (BACTRIM DS) 800-160 MG tablet Take 1 tablet by mouth 2 (two) times daily.   05/09/2019 at 0800  . tamsulosin (FLOMAX) 0.4 MG CAPS capsule Take 1 capsule (0.4 mg total) by mouth daily. 30 capsule  05/09/2019 at 0900   Home medication reconciliation was completed with the patient.   Scheduled Inpatient Medications:   . amLODipine  10 mg Oral Daily  . aspirin EC  81 mg Oral Daily  . atorvastatin  40 mg Oral QHS  . citalopram  20 mg Oral Daily  .  clopidogrel  75 mg Oral Daily  . divalproex  500 mg Oral BID  . glipiZIDE  2.5 mg Oral BID  . insulin aspart  0-15 Units Subcutaneous TID WC  . insulin aspart  0-5 Units Subcutaneous QHS  . levothyroxine  125 mcg Oral QAC breakfast  . lipase/protease/amylase  12,000 Units Oral TID WC  . metoprolol tartrate  25 mg Oral BID  . pantoprazole (PROTONIX) IV  40 mg Intravenous Q12H  . sodium bicarbonate  650 mg Oral BID  . tamsulosin  0.4 mg Oral Daily    Continuous Inpatient Infusions:   . sodium chloride    . sodium chloride 50 mL/hr at 05/10/19 0300    PRN Inpatient Medications:  acetaminophen **OR** acetaminophen, hydrALAZINE, ondansetron **OR** ondansetron (ZOFRAN) IV, polyethylene glycol, promethazine  Family History: family history includes Heart disease in his brother and father; Kidney failure in his father and mother.  The patient's family history is negative for inflammatory bowel disorders, GI malignancy, or solid organ transplantation.  Social History:   reports that he quit smoking about 40 years ago. His smoking use included cigarettes. He has a 2.50 pack-year smoking history. He has never used smokeless tobacco. He reports previous alcohol use. He reports that he does not use drugs. The patient denies ETOH, tobacco, or drug use.   Review of Systems:  Unable to obtain due to patient's mental status    Physical Examination: BP 132/60 (BP Location: Left Arm)   Pulse (!) 58   Temp 98.1 F (36.7 C) (Oral)   Resp 18   Ht 5\' 11"  (1.803 m)   Wt 68 kg   SpO2 99%   BMI 20.92 kg/m   Lethargy, non-toxic appearing male in hospital bed Gen: NAD, alert and oriented to person HEENT: PEERLA, EOMI, Neck: supple, no JVD or thyromegaly Chest: CTA bilaterally, no wheezes, crackles, or other adventitious sounds CV: RRR, no m/g/c/r Abd: soft, NT, ND, hypoactive BS in all four quadrants; no HSM, guarding, ridigity, or rebound tenderness Ext: no edema, well perfused with 2+  pulses, Skin: no rash or lesions noted Lymph: no LAD  Data: Lab Results  Component Value Date   WBC 8.2 05/10/2019   HGB 8.8 (L) 05/10/2019   HCT 27.8 (L) 05/10/2019   MCV 93.0 05/10/2019   PLT 136 (L) 05/10/2019   Recent Labs  Lab 05/09/19 1907 05/10/19 0405 05/10/19 1814  HGB 7.6* 8.6* 8.8*   Lab Results  Component  Value Date   NA 141 05/10/2019   K 5.0 05/10/2019   CL 115 (H) 05/10/2019   CO2 22 05/10/2019   BUN 53 (H) 05/10/2019   CREATININE 3.95 (H) 05/10/2019   Lab Results  Component Value Date   ALT 14 04/24/2019   AST 27 04/24/2019   ALKPHOS 65 04/24/2019   BILITOT 0.5 04/24/2019   Recent Labs  Lab 05/09/19 1907  APTT 39*  INR 1.2   Assessment/Plan:  68 y/o AA male with PMH of CKD Stage IV, diastolic CHF, Hx of seizures, Hx of TIA on chronic dual antiplatelet therapy, hypothyroidism admitted 08/03 for AMS and acute blood loss anemia  1. Acute blood loss anemia  2. Hematemesis 3. CKD Stage IV 4. Chronic antiplatelet therapy - aspirin 81 mg and Plavix  - Concern for possible UGI source of bleeding. Differential includes esophagitis, Mallory-Weiss tear, Dielafoy's lesion, GAVE, AVMs, gastritis, PUD +/- H pylori, duodenitis, right colon source, malignancy - Drop in hemoglobin from baseline mid 9s to 7.6. He is s/p 1 unit of pRBCs.  - Hold all antiplatelet therapy. Per nursing, patient received Plavix this morning. - Agree with PPI - Continue to monitor H&H. Transfuse for Hgb <7.0. - Advise EGD for diagnostic and potential therapeutic intervention. I discussed procedure details and indications with patient's two daughters Beryle Flock and [place]) who have consented to the procedure. I reviewed the risks (including bleeding, perforation, infection, anesthesia complications, cardiac/respiratory complications), benefits and alternatives of EGD. Patient consents to proceed.  - If EGD is negative, consider colonoscopy - Further recommendations after  procedure   Thank you for the consult. Please call with questions or concerns.  Reeves Forth [facility] [phone] (Cell)

## 2019-05-11 NOTE — Anesthesia Preprocedure Evaluation (Addendum)
Anesthesia Evaluation  Patient identified by MRN, date of birth, ID band Patient confused  General Assessment Comment:Consent from daughter  Reviewed: Allergy & Precautions, H&P , NPO status , reviewed documented beta blocker date and time   Airway Mallampati: III  TM Distance: >3 FB Neck ROM: full    Dental  (+) Edentulous Upper, Edentulous Lower   Pulmonary shortness of breath, pneumonia, former smoker,    Pulmonary exam normal        Cardiovascular hypertension, + Peripheral Vascular Disease and +CHF  Normal cardiovascular exam     Neuro/Psych PSYCHIATRIC DISORDERS Anxiety Depression    GI/Hepatic   Endo/Other  diabetesHypothyroidism   Renal/GU Renal diseaseChronic hyperkalemia     Musculoskeletal   Abdominal   Peds  Hematology  (+) Blood dyscrasia, anemia ,   Anesthesia Other Findings Past Medical History: No date: Anemia of chronic disease No date: Blindness of left eye No date: Chronic kidney disease, stage III (moderate) (HCC) No date: Diabetes ([place]) No date: Diarrhea No date: Diastolic dysfunction     Comment:  a. 06/2017 Echo: EF 55-60%, no rwma, Gr2 DD, Asc Ao               3.9cm, nl RV fxn, nl PASP. No date: Hypertension No date: Hypothyroidism No date: Monoclonal gammopathy of unknown significance (MGUS)     Comment:  a. prev seen @ [facility].  Lost to f/u - never had BM bx. No date: Orthostatic hypotension No date: Syncope and collapse  Past Surgical History: 09/19/2016: AMPUTATION TOE; Right     Comment:  Procedure: AMPUTATION TOE;  Surgeon: Albertine Patricia,               DPM;  Location: [facility] OR[facility];  Service: Podiatry;                Laterality: Right; 09/15/2016: PERIPHERAL VASCULAR CATHETERIZATION; N/A     Comment:  Procedure: Lower Extremity Angiography;  Surgeon: Algernon Huxley, MD;  Location: [facility];  Service:               Cardiovascular;  Laterality: N/A; No date:  THYROID SURGERY  BMI    Body Mass Index: 20.92 kg/m      Reproductive/Obstetrics                           Anesthesia Physical Anesthesia Plan  ASA: III  Anesthesia Plan: General   Post-op Pain Management:    Induction: Intravenous  PONV Risk Score and Plan: 2 and TIVA  Airway Management Planned: Nasal Cannula and Natural Airway  Additional Equipment:   Intra-op Plan:   Post-operative Plan:   Informed Consent: I have reviewed the patients History and Physical, chart, labs and discussed the procedure including the risks, benefits and alternatives for the proposed anesthesia with the patient or authorized representative who has indicated his/her understanding and acceptance.     Dental Advisory Given  Plan Discussed with: CRNA  Anesthesia Plan Comments:         Anesthesia Quick Evaluation

## 2019-05-11 NOTE — Consult Note (Signed)
[facility] Face-to-Face Psychiatry Consult   Reason for Consult:  AMS Referring Physician:  Dr. Manuella Ghazi Patient Identification: Cameron Adams MRN:  [id number] Principal Diagnosis: <principal problem not specified> Diagnosis:  Active Problems:   Dehydration   Total Time spent with patient: 1 hour  Subjective:   Cameron Adams is a 68 y.o. male patient reports that he came to the hospital due to falling at home and hitting his right eye. He states that he is not suicidal or homicidal and denies any hallucinations. He reports that he lives with his daughter, [name]. He states that he is doing ok today.   Daughter, [name], was contacted for collateral. She reports that she has concerns for medical issues, but not mental health. She states that he has depression and has been complaint with his citalopram. She states that he had presented this way previously and it was due to a UTI.  HPI:  Per Gardiner Barefoot NP: 68 y.o. male with a known history of diabetes mellitus, CKD, diastolic CHF, history of seizure, hypothyroidism, and history of urinary retention.  Patient presented to the emergency room after sustaining a fall while attempting to get into his wheelchair.  He recalls hitting his right eye on a piece of metal however is unsure what this was.  There is edema to his right eye with tenderness.  No lacerations or wounds noted.  He denies loss of consciousness he denies dizziness or headache.  He denies blurred vision however vision is diminished with edema.  Patient has a left eye prosthesis.  He is on anticoagulant with aspirin and Plavix.  Patient is known to have a decreased hemoglobin from 9 to 7.6 on arrival when compared to a 2-week period.  He denies hematemesis, hematochezia, or melena.  He denies abdominal pain.  He denies diarrhea, nausea, vomiting.  He denies recent fevers or chills.  He denies chest pain or shortness of breath.  Patient has a history of chronic renal failure with baseline  labs including BUN of 30 and creatinine 2.27 on 04/30/2019.  Labs completed in the emergency room demonstrate BUN of 54 and creatinine 3.96 with hemoglobin 7.6 and hematocrit 24.1 and platelet count 141.  CT face is negative for acute findings or fractures.  05/11/19: Patient is seen by this provider today. He is more alert and oriented today. His speech has improved since yesterday and he is answering all questions apropriately. Patient can open up his right eye for just a short period of time and then closes it again. He reports that he has had depression but that it has not been getting worse. He denies any suicidal or homicidal ideations and denies any hallucinations.Patient does not meet inpatient criteria and is psychiatrically cleared. I have notified Dr. Manuella Ghazi through secure chat.  05/10/19: Patient is seen by this provider face-to-face today. He is l;ying in bed and speaks when spoken to. He is unable to make eye contact due to a l;eft prosthetic eye and the right eye is swollen from his fall. His speech is slow and slurred and difficult to understand, but his answer are correct and was oriented to place, person, time, and that he lived with his daughter. There were reports of him being very confused and needing constant reminding and redirection, but not being witnessed now. I had the RN go with me to ensure of the patient's status and he reported that the patient is answering questions better than this morning. Will continue following this patent to see  if there are worsening symptoms or improvements. He does not meet inpatient criteria at this time.   Past Psychiatric History: Depression Risk to Self:   Risk to Others:   Prior Inpatient Therapy:   Prior Outpatient Therapy:    Past Medical History:  Past Medical History:  Diagnosis Date  . Anemia of chronic disease   . Blindness of left eye   . Chronic kidney disease, stage III (moderate) (HCC)   . Diabetes ([place])   . Diarrhea   . Diastolic  dysfunction    a. 06/2017 Echo: EF 55-60%, no rwma, Gr2 DD, Asc Ao 3.9cm, nl RV fxn, nl PASP.  Marland Kitchen Hypertension   . Hypothyroidism   . Monoclonal gammopathy of unknown significance (MGUS)    a. prev seen @ [facility].  Lost to f/u - never had BM bx.  . Orthostatic hypotension   . Syncope and collapse     Past Surgical History:  Procedure Laterality Date  . 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 PERIPHERAL VASCULAR CATHETERIZATION N/A 09/15/2016   Procedure: Lower Extremity Angiography;  Surgeon: Algernon Huxley, MD;  Location: [facility];  Service: Cardiovascular;  Laterality: N/A;  . THYROID SURGERY     Family History:  Family History  Problem Relation Age of Onset  . Kidney failure Mother   . Heart disease Father   . Kidney failure Father   . Heart disease Brother    Family Psychiatric  History: None reported Social History:  Social History   Substance and Sexual Activity  Alcohol Use Not Currently   Comment: previously drank heavily but not in many years     Social History   Substance and Sexual Activity  Drug Use No    Social History   Socioeconomic History  . Marital status: Single    Spouse name: Not on file  . Number of children: Not on file  . Years of education: Not on file  . Highest education level: Not on file  Occupational History  . Occupation: Retired  Scientific laboratory technician  . Financial resource strain: Hard  . Food insecurity    Worry: Not on file    Inability: Not on file  . Transportation needs    Medical: No    Non-medical: No  Tobacco Use  . Smoking status: Former Smoker    Packs/day: 0.10    Years: 25.00    Pack years: 2.50    Types: Cigarettes    Quit date: 10/31/1978    Years since quitting: 40.5  . Smokeless tobacco: Never Used  . Tobacco comment: smoked 2-3 cigarettes/day  Substance and Sexual Activity  . Alcohol use: Not  Currently    Comment: previously drank heavily but not in many years  . Drug use: No  . Sexual activity: Not on file  Lifestyle  . Physical activity    Days per week: 3 days    Minutes per session: 20 min  . Stress: Not on file  Relationships  . Social Herbalist on phone: Not on file    Gets together: Not on file    Attends religious service: Not on file    Active member of club or organization: Not on file    Attends meetings of clubs or organizations: Not on file    Relationship status: Not on file  Other Topics Concern  .  Not on file  Social History Narrative   Lives in [place] by himself.  Says he is very active at home but does not routinely exercise.  Usual diet is poor - some days he might only drink a few cups of coffee.   Additional Social History:    Allergies:   Allergies  Allergen Reactions  . Gabapentin     Other reaction(s): Other (See Comments) Trouble sleeping     Labs:  Results for orders placed or performed during the hospital encounter of 05/09/19 (from the past 48 hour(s))  CBC with Differential     Status: Abnormal   Collection Time: 05/09/19  7:07 PM  Result Value Ref Range   WBC 4.7 4.0 - 10.5 K/uL   RBC 2.63 (L) 4.22 - 5.81 MIL/uL   Hemoglobin 7.6 (L) 13.0 - 17.0 g/dL   HCT 24.1 (L) 39.0 - 52.0 %   MCV 91.6 80.0 - 100.0 fL   MCH 28.9 26.0 - 34.0 pg   MCHC 31.5 30.0 - 36.0 g/dL   RDW 14.0 11.5 - 15.5 %   Platelets 141 (L) 150 - 400 K/uL   nRBC 0.0 0.0 - 0.2 %   Neutrophils Relative % 63 %   Neutro Abs 2.9 1.7 - 7.7 K/uL   Lymphocytes Relative 23 %   Lymphs Abs 1.1 0.7 - 4.0 K/uL   Monocytes Relative 7 %   Monocytes Absolute 0.4 0.1 - 1.0 K/uL   Eosinophils Relative 6 %   Eosinophils Absolute 0.3 0.0 - 0.5 K/uL   Basophils Relative 0 %   Basophils Absolute 0.0 0.0 - 0.1 K/uL   Immature Granulocytes 1 %   Abs Immature Granulocytes 0.03 0.00 - 0.07 K/uL    Comment: Performed at [facility], [address]  [place], [place] [address]  Basic metabolic panel     Status: Abnormal   Collection Time: 05/09/19  7:07 PM  Result Value Ref Range   Sodium 135 135 - 145 mmol/L   Potassium 5.0 3.5 - 5.1 mmol/L   Chloride 111 98 - 111 mmol/L   CO2 19 (L) 22 - 32 mmol/L   Glucose, Bld 165 (H) 70 - 99 mg/dL   BUN 54 (H) 8 - 23 mg/dL   Creatinine, Ser 3.96 (H) 0.61 - 1.24 mg/dL   Calcium 7.6 (L) 8.9 - 10.3 mg/dL   GFR calc non Af Amer 15 (L) >60 mL/min   GFR calc Af Amer 17 (L) >60 mL/min   Anion gap 5 5 - 15    Comment: Performed at [facility], [address] [place], [place] [address]  Protime-INR     Status: Abnormal   Collection Time: 05/09/19  7:07 PM  Result Value Ref Range   Prothrombin Time 15.3 (H) 11.4 - 15.2 seconds   INR 1.2 0.8 - 1.2    Comment: (NOTE) INR goal varies based on device and disease states. Performed at [facility], [address] [place], [place] [address]   APTT     Status: Abnormal   Collection Time: 05/09/19  7:07 PM  Result Value Ref Range   aPTT 39 (H) 24 - 36 seconds    Comment:        IF BASELINE aPTT IS ELEVATED, SUGGEST PATIENT RISK ASSESSMENT BE USED TO DETERMINE APPROPRIATE ANTICOAGULANT THERAPY. Performed at [facility], [address], [place],  [address]   ABO/Rh     Status: None   Collection Time: 05/09/19  7:07 PM  Result Value Ref Range   ABO/RH(D)      O POS Performed at [facility], [address] [place], [place] [address]   Glucose, capillary     Status: Abnormal   Collection Time: 05/09/19  8:14 PM  Result Value Ref Range   Glucose-Capillary 146 (H) 70 - 99 mg/dL  Prepare RBC     Status: None   Collection Time: 05/09/19  9:00 PM  Result Value Ref Range   Order Confirmation      ORDER PROCESSED BY BLOOD BANK Performed at [facility], [address] [place], [place] [address]   Type and screen [place]     Status: None   Collection Time: 05/09/19   9:15 PM  Result Value Ref Range   ABO/RH(D) O POS    Antibody Screen NEG    Sample Expiration 05/12/2019,2359    Unit Number [id number]    Blood Component Type RED CELLS,LR    Unit division 00    Status of Unit ISSUED,FINAL    Transfusion Status OK TO TRANSFUSE    Crossmatch Result      Compatible Performed at [facility], [address], [place],  [address]   SARS Coronavirus 2 [facility] order, Performed in [place] hospital lab)     Status: None   Collection Time: 05/09/19  9:28 PM  Result Value Ref Range   SARS Coronavirus 2 NEGATIVE NEGATIVE    Comment: (NOTE) If result is NEGATIVE SARS-CoV-2 target nucleic acids are NOT DETECTED. The SARS-CoV-2 RNA is generally detectable in upper and lower  respiratory specimens during the acute phase of infection. The lowest  concentration of SARS-CoV-2 viral copies this assay can detect is 250  copies / mL. A negative result does not preclude SARS-CoV-2 infection  and should not be used as the sole basis for treatment or other  patient management decisions.  A negative result may occur with  improper specimen collection / handling, submission of specimen other  than nasopharyngeal swab, presence of viral mutation(s) within the  areas targeted by this assay, and inadequate number of viral copies  (<250 copies / mL). A negative result must be combined with clinical  observations, patient history, and epidemiological information. If result is POSITIVE SARS-CoV-2 target nucleic acids are DETECTED. The SARS-CoV-2 RNA is generally detectable in upper and lower  respiratory specimens dur ing the acute phase of infection.  Positive  results are indicative of active infection with SARS-CoV-2.  Clinical  correlation with patient history and other diagnostic information is  necessary to determine patient infection status.  Positive results do  not rule out bacterial infection or co-infection with other viruses. If result  is PRESUMPTIVE POSTIVE SARS-CoV-2 nucleic acids MAY BE PRESENT.   A presumptive positive result was obtained on the submitted specimen  and confirmed on repeat testing.  While 2019 novel coronavirus  (SARS-CoV-2) nucleic acids may be present in the submitted sample  additional confirmatory testing may be necessary for epidemiological  and / or clinical management purposes  to differentiate between  SARS-CoV-2 and other Sarbecovirus currently known to infect humans.  If clinically indicated additional testing with an alternate test  methodology [phone]) is advised. The SARS-CoV-2 RNA is generally  detectable in upper and lower respiratory sp ecimens during the acute  phase of infection. The expected result is Negative. Fact Sheet for Patients:  StrictlyIdeas.no Fact Sheet for Healthcare Providers: BankingDealers.co.za This test is not yet approved  or cleared by the [place] and has been authorized for detection and/or diagnosis of SARS-CoV-2 by FDA under an Emergency Use Authorization (EUA).  This EUA will remain in effect (meaning this test can be used) for the duration of the COVID-19 declaration under Section 564(b)(1) of the Act, 21 U.S.C. section 360bbb-3(b)(1), unless the authorization is terminated or revoked sooner. Performed at [facility], [address] [place], [place] [address]   Glucose, capillary     Status: None   Collection Time: 05/09/19 10:16 PM  Result Value Ref Range   Glucose-Capillary 90 70 - 99 mg/dL  TSH     Status: Abnormal   Collection Time: 05/10/19  4:05 AM  Result Value Ref Range   TSH 17.536 (H) 0.350 - 4.500 uIU/mL    Comment: Performed by a 3rd Generation assay with a functional sensitivity of <=0.01 uIU/mL. Performed at [facility], [address] [place], [place] [address]   Basic metabolic panel     Status: Abnormal   Collection Time: 05/10/19  4:05 AM  Result  Value Ref Range   Sodium 141 135 - 145 mmol/L   Potassium 5.0 3.5 - 5.1 mmol/L   Chloride 115 (H) 98 - 111 mmol/L   CO2 22 22 - 32 mmol/L   Glucose, Bld 81 70 - 99 mg/dL   BUN 53 (H) 8 - 23 mg/dL   Creatinine, Ser 3.95 (H) 0.61 - 1.24 mg/dL   Calcium 7.8 (L) 8.9 - 10.3 mg/dL   GFR calc non Af Amer 15 (L) >60 mL/min   GFR calc Af Amer 17 (L) >60 mL/min   Anion gap 4 (L) 5 - 15    Comment: Performed at [facility], [address] [place], [place] [address]  CBC     Status: Abnormal   Collection Time: 05/10/19  4:05 AM  Result Value Ref Range   WBC 5.3 4.0 - 10.5 K/uL   RBC 3.00 (L) 4.22 - 5.81 MIL/uL   Hemoglobin 8.6 (L) 13.0 - 17.0 g/dL   HCT 26.9 (L) 39.0 - 52.0 %   MCV 89.7 80.0 - 100.0 fL   MCH 28.7 26.0 - 34.0 pg   MCHC 32.0 30.0 - 36.0 g/dL   RDW 13.8 11.5 - 15.5 %   Platelets 137 (L) 150 - 400 K/uL   nRBC 0.0 0.0 - 0.2 %    Comment: Performed at [facility] [address] [place], [place] [address]  Glucose, capillary     Status: None   Collection Time: 05/10/19  7:38 AM  Result Value Ref Range   Glucose-Capillary 87 70 - 99 mg/dL  Glucose, capillary     Status: None   Collection Time: 05/10/19 11:36 AM  Result Value Ref Range   Glucose-Capillary 96 70 - 99 mg/dL  Ammonia     Status: None   Collection Time: 05/10/19  3:29 PM  Result Value Ref Range   Ammonia 19 9 - 35 umol/L    Comment: Performed at [facility], [address] [place], [place] [address]  Glucose, capillary     Status: Abnormal   Collection Time: 05/10/19  5:10 PM  Result Value Ref Range   Glucose-Capillary 122 (H) 70 - 99 mg/dL  CBC     Status: Abnormal   Collection Time: 05/10/19  6:14 PM  Result Value Ref Range   WBC 8.2 4.0 - 10.5 K/uL   RBC 2.99 (L) 4.22 - 5.81 MIL/uL  Hemoglobin 8.8 (L) 13.0 - 17.0 g/dL   HCT 27.8 (L) 39.0 - 52.0 %   MCV 93.0 80.0 - 100.0 fL   MCH 29.4 26.0 - 34.0 pg   MCHC 31.7 30.0 - 36.0 g/dL   RDW 14.4 11.5 - 15.5 %   Platelets  136 (L) 150 - 400 K/uL   nRBC 0.0 0.0 - 0.2 %    Comment: Performed at [facility], [address] [place], [place] [address]  Glucose, capillary     Status: Abnormal   Collection Time: 05/10/19  9:08 PM  Result Value Ref Range   Glucose-Capillary 113 (H) 70 - 99 mg/dL  Glucose, capillary     Status: None   Collection Time: 05/11/19  7:48 AM  Result Value Ref Range   Glucose-Capillary 77 70 - 99 mg/dL  Glucose, capillary     Status: None   Collection Time: 05/11/19 11:46 AM  Result Value Ref Range   Glucose-Capillary 86 70 - 99 mg/dL  Basic metabolic panel     Status: Abnormal   Collection Time: 05/11/19 11:54 AM  Result Value Ref Range   Sodium 142 135 - 145 mmol/L   Potassium 5.5 (H) 3.5 - 5.1 mmol/L   Chloride 118 (H) 98 - 111 mmol/L   CO2 17 (L) 22 - 32 mmol/L   Glucose, Bld 82 70 - 99 mg/dL   BUN 60 (H) 8 - 23 mg/dL   Creatinine, Ser 3.71 (H) 0.61 - 1.24 mg/dL   Calcium 8.0 (L) 8.9 - 10.3 mg/dL   GFR calc non Af Amer 16 (L) >60 mL/min   GFR calc Af Amer 18 (L) >60 mL/min   Anion gap 7 5 - 15    Comment: Performed at [facility], [address] [place], [place] [address]    Current Facility-Administered Medications  Medication Dose Route Frequency Provider Last Rate Last Dose  . 0.9 %  sodium chloride infusion  10 mL/hr Intravenous Once Marjean Donna E, MD      . 0.9 %  sodium chloride infusion   Intravenous Continuous Seals, Theo Dills, NP 50 mL/hr at 05/10/19 0300    . acetaminophen (TYLENOL) tablet 650 mg  650 mg Oral Q6H PRN Seals, Theo Dills, NP       Or  . acetaminophen (TYLENOL) suppository 650 mg  650 mg Rectal Q6H PRN Seals, Theo Dills, NP      . amLODipine (NORVASC) tablet 10 mg  10 mg Oral Daily Seals, Levada Dy H, NP   10 mg at 05/11/19 0943  . aspirin EC tablet 81 mg  81 mg Oral Daily Mayer Camel, NP   81 mg at 05/11/19 0943  . atorvastatin (LIPITOR) tablet 40 mg  40 mg Oral QHS Seals, Angela H, NP      . citalopram (CELEXA) tablet 20 mg  20  mg Oral Daily Seals, Angela H, NP   20 mg at 05/11/19 0943  . clopidogrel (PLAVIX) tablet 75 mg  75 mg Oral Daily Seals, Theo Dills, NP   75 mg at 05/11/19 0944  . divalproex (DEPAKOTE ER) 24 hr tablet 500 mg  500 mg Oral BID Gardiner Barefoot H, NP   500 mg at 05/11/19 0942  . glipiZIDE (GLUCOTROL) tablet 2.5 mg  2.5 mg Oral BID Gardiner Barefoot H, NP   Stopped at 05/10/19 1221  . hydrALAZINE (APRESOLINE) injection 10 mg  10 mg Intravenous Q6H PRN Mansy, Jan A, MD   10 mg at 05/11/19  2841  . insulin aspart (novoLOG) injection 0-15 Units  0-15 Units Subcutaneous TID WC Seals, Theo Dills, NP   Stopped at 05/10/19 1742  . insulin aspart (novoLOG) injection 0-5 Units  0-5 Units Subcutaneous QHS Seals, Angela H, NP      . levothyroxine (SYNTHROID) tablet 125 mcg  125 mcg Oral QAC breakfast Max Sane, MD      . lipase/protease/amylase (CREON) capsule 12,000 Units  12,000 Units Oral TID WC Seals, Theo Dills, NP   Stopped at 05/10/19 1218  . metoprolol tartrate (LOPRESSOR) tablet 25 mg  25 mg Oral BID Gardiner Barefoot H, NP   25 mg at 05/11/19 0943  . ondansetron (ZOFRAN) tablet 4 mg  4 mg Oral Q6H PRN Seals, Theo Dills, NP       Or  . ondansetron (ZOFRAN) injection 4 mg  4 mg Intravenous Q6H PRN Seals, Theo Dills, NP   4 mg at 05/10/19 1229  . pantoprazole (PROTONIX) injection 40 mg  40 mg Intravenous Q12H Seals, Angela H, NP   40 mg at 05/11/19 0942  . polyethylene glycol (MIRALAX / GLYCOLAX) packet 17 g  17 g Oral Daily PRN Seals, Levada Dy H, NP      . promethazine (PHENERGAN) injection 12.5 mg  12.5 mg Intravenous Q6H PRN Max Sane, MD   12.5 mg at 05/10/19 1817  . sodium bicarbonate tablet 650 mg  650 mg Oral BID Gardiner Barefoot H, NP   650 mg at 05/11/19 0944  . tamsulosin (FLOMAX) capsule 0.4 mg  0.4 mg Oral Daily Seals, Angela H, NP   0.4 mg at 05/11/19 3244    Musculoskeletal: Strength & Muscle Tone: decreased Gait & Station: Unable to assess, patient remained in the bed during evaluation Patient leans:  N/A  Psychiatric Specialty Exam: Physical Exam  Nursing note and vitals reviewed. Constitutional: He is oriented to person, place, and time. He appears well-developed and well-nourished.  Cardiovascular: Normal rate.  Respiratory: Effort normal.  Musculoskeletal: Normal range of motion.  Neurological: He is alert and oriented to person, place, and time.    Review of Systems  Cardiovascular: Negative.   Skin: Negative.   Psychiatric/Behavioral: Negative.     Blood pressure 112/66, pulse (!) 53, temperature 98 F (36.7 C), temperature source Oral, resp. rate 16, height 5\' 11"  (1.803 m), weight 68 kg, SpO2 94 %.Body mass index is 20.92 kg/m.  General Appearance: Casual  Eye Contact:  None: Prosthetic left eye and right orbit swollen from fall  Speech:  Clear and Coherent and Slow  Volume:  Decreased  Mood:  Euthymic  Affect:  Flat  Thought Process:  Coherent and Descriptions of Associations: Intact  Orientation:  Full (Time, Place, and Person)  Thought Content:  WDL  Suicidal Thoughts:  No  Homicidal Thoughts:  No  Memory:  Immediate;   Good Recent;   Good Remote;   Good  Judgement:  Fair  Insight:  Fair  Psychomotor Activity:  Normal  Concentration:  Concentration: Fair  Recall:  [place] of Knowledge:  Fair  Language:  Good  Akathisia:  No  Handed:    AIMS (if indicated):     Assets:  Financial Resources/Insurance Housing Resilience Social Support  ADL's:  Impaired  Cognition:  WNL  Sleep:        Treatment Plan Summary: Daily contact with patient to assess and evaluate symptoms and progress in treatment  Disposition: No evidence of imminent risk to self or others at present.   Patient  does not meet criteria for psychiatric inpatient admission.  Psychiatry will sign off as patient has stabilized and improved significantly  Lewis Shock, FNP 05/11/2019 1:51 PM

## 2019-05-12 ENCOUNTER — Encounter: Payer: Self-pay | Admitting: Internal Medicine

## 2019-05-12 LAB — CBC
HCT: 27.4 % — ABNORMAL LOW (ref 39.0–52.0)
Hemoglobin: 8.8 g/dL — ABNORMAL LOW (ref 13.0–17.0)
MCH: 29.1 pg (ref 26.0–34.0)
MCHC: 32.1 g/dL (ref 30.0–36.0)
MCV: 90.7 fL (ref 80.0–100.0)
Platelets: 136 10*3/uL — ABNORMAL LOW (ref 150–400)
RBC: 3.02 MIL/uL — ABNORMAL LOW (ref 4.22–5.81)
RDW: 14.3 % (ref 11.5–15.5)
WBC: 6.2 10*3/uL (ref 4.0–10.5)
nRBC: 0 % (ref 0.0–0.2)

## 2019-05-12 LAB — BASIC METABOLIC PANEL
Anion gap: 6 (ref 5–15)
BUN: 56 mg/dL — ABNORMAL HIGH (ref 8–23)
CO2: 21 mmol/L — ABNORMAL LOW (ref 22–32)
Calcium: 7.8 mg/dL — ABNORMAL LOW (ref 8.9–10.3)
Chloride: 116 mmol/L — ABNORMAL HIGH (ref 98–111)
Creatinine, Ser: 3.56 mg/dL — ABNORMAL HIGH (ref 0.61–1.24)
GFR calc Af Amer: 19 mL/min — ABNORMAL LOW (ref >60)
GFR calc non Af Amer: 17 mL/min — ABNORMAL LOW (ref >60)
Glucose, Bld: 94 mg/dL (ref 70–99)
Potassium: 5 mmol/L (ref 3.5–5.1)
Sodium: 143 mmol/L (ref 135–145)

## 2019-05-12 LAB — GLUCOSE, CAPILLARY
Glucose-Capillary: 103 mg/dL — ABNORMAL HIGH (ref 70–99)
Glucose-Capillary: 240 mg/dL — ABNORMAL HIGH (ref 70–99)
Glucose-Capillary: 115 mg/dL — ABNORMAL HIGH (ref 70–99)
Glucose-Capillary: 163 mg/dL — ABNORMAL HIGH (ref 70–99)

## 2019-05-12 MED ORDER — LACTATED RINGERS IV SOLN
INTRAVENOUS | Status: DC
  Administered 2019-05-12: 10:00:00 via INTRAVENOUS

## 2019-05-12 MED ORDER — RENA-VITE PO TABS
1.0000 | ORAL_TABLET | Freq: Every day | ORAL | Status: DC
  Administered 2019-05-12 – 2019-05-20 (×9): 1 via ORAL
  Filled 2019-05-12 (×9): qty 1

## 2019-05-12 MED ORDER — LEVOTHYROXINE SODIUM 100 MCG PO TABS
100.0000 ug | ORAL_TABLET | Freq: Every day | ORAL | Status: DC
  Administered 2019-05-13 – 2019-05-21 (×9): 100 ug via ORAL
  Filled 2019-05-12 (×9): qty 1

## 2019-05-12 MED ORDER — SODIUM CHLORIDE 0.9 % IV SOLN: INTRAVENOUS | Status: DC

## 2019-05-12 MED ORDER — SODIUM BICARBONATE 8.4 % IV SOLN
INTRAVENOUS | Status: DC
  Filled 2019-05-12 (×2): qty 150

## 2019-05-12 MED ORDER — LEVOTHYROXINE SODIUM 50 MCG PO TABS
25.0000 ug | ORAL_TABLET | Freq: Every day | ORAL | Status: DC
  Administered 2019-05-13 – 2019-05-21 (×9): 25 ug via ORAL
  Filled 2019-05-12 (×9): qty 1

## 2019-05-12 MED ORDER — GLUCERNA SHAKE PO LIQD
237.0000 mL | Freq: Two times a day (BID) | ORAL | Status: DC
  Administered 2019-05-13 – 2019-05-20 (×13): 237 mL via ORAL

## 2019-05-12 NOTE — Anesthesia Postprocedure Evaluation (Signed)
Anesthesia Post Note  Patient: Cameron Adams  Procedure(s) Performed: ESOPHAGOGASTRODUODENOSCOPY (EGD) (N/A )  Patient location during evaluation: Endoscopy Anesthesia Type: General Level of consciousness: awake and alert Pain management: pain level controlled Vital Signs Assessment: post-procedure vital signs reviewed and stable Respiratory status: spontaneous breathing, nonlabored ventilation and respiratory function stable Cardiovascular status: blood pressure returned to baseline and stable Postop Assessment: no apparent nausea or vomiting Anesthetic complications: no     Last Vitals:  Vitals:   05/11/19 2058 05/12/19 0501  BP: (!) 145/60 (!) 156/64  Pulse: (!) 55 (!) 52  Resp: 20 20  Temp: 36.8 C 36.8 C  SpO2: 98% 97%    Last Pain:  Vitals:   05/12/19 0501  TempSrc: Oral  PainSc:                  Alphonsus Sias

## 2019-05-12 NOTE — Care Management Important Message (Signed)
Important Message  Patient Details  Name: Cameron Adams MRN: [id number] Date of Birth: [birth year]   Medicare Important Message Given:  Yes     [name] 05/12/2019, 1:55 PM

## 2019-05-12 NOTE — Progress Notes (Signed)
Initial Nutrition Assessment  DOCUMENTATION CODES:   Not applicable  INTERVENTION:   Glucerna Shake po BID, each supplement provides 220 kcal and 10 grams of protein  Rena-vite daily   NUTRITION DIAGNOSIS:   Increased nutrient needs related to chronic illness(CHF, CKD) as evidenced by increased estimated needs.  GOAL:   Patient will meet greater than or equal to 90% of their needs  MONITOR:   PO intake, Supplement acceptance, Labs, Weight trends, Skin, I & O's  REASON FOR ASSESSMENT:   Malnutrition Screening Tool    ASSESSMENT:   68 y.o. male with a known history of diabetes mellitus, CKD, diastolic CHF, history of seizure, hypothyroidism, and history of urinary retention admitted s/p fall and with GIB  RD working remotely.  Pt s/p EGD 8/5; pt noted to have mallory-weiss tears  RD familiar with this patient from multiple previous admits. Pt with fair appetite and oral intake at baseline. Pt currently eating 100% of meals. Pt drinks Glucerna at home; RD will order this for in hospital as well. RD will also add renal multivitamin. Per chart, pt appears fairly weight stable for the past 5 months.    Pt at high risk for malnutrition but unable to diagnose at this time as NFPE cannot be performed.   Medications reviewed and include: aspirin, celexa, plavix, glipizide, insulin, synthroid, creon, protonix, NaBicarb, LRS @75ml /hr  Labs reviewed: BUN 56(H), creat 3.56(H) Hgb 8.8(L), Hct 27.4(L)  Unable to complete Nutrition-Focused physical exam at this time.   Diet Order:   Diet Order            Diet regular Room service appropriate? Yes; Fluid consistency: Thin  Diet effective now             EDUCATION NEEDS:   No education needs have been identified at this time  Skin:  Skin Assessment: Reviewed RN Assessment  Last BM:  8/5- type 3  Height:   Ht Readings from Last 1 Encounters:  05/11/19 5\' 11"  (1.803 m)    Weight:   Wt Readings from Last 1  Encounters:  05/11/19 68 kg    Ideal Body Weight:  78 kg  BMI:  Body mass index is 20.92 kg/m.  Estimated Nutritional Needs:   Kcal:  1900-2200kcal/day  Protein:  95-110g/day  Fluid:  >1.7L/day  Koleen Distance MS, RD, LDN Pager #- [phone] Office#- [phone] After Hours Pager: [phone]

## 2019-05-12 NOTE — Progress Notes (Signed)
[facility]  ROUNDING NOTE   Subjective:   08/05 0701 - 08/06 0700 In: 1826.5 [I.V.:1826.5] Out: 1450 [Urine:1450] Profuse Diarrhea - started today Patient reports it is chronic and going on for 2 years Takes imodium at home No blood in stool No abdominal pain or cramping States appetite is good   Objective:  Vital signs in last 24 hours:  Temp:  [97.4 F (36.3 C)-98.3 F (36.8 C)] 98.3 F (36.8 C) (08/06 0501) Pulse Rate:  [48-55] 52 (08/06 0501) Resp:  [11-20] 20 (08/06 0501) BP: (99-156)/(50-66) 156/64 (08/06 0501) SpO2:  [94 %-100 %] 97 % (08/06 0501) Weight:  [68 kg] 68 kg (08/05 1357)  Weight change:  Filed Weights   05/09/19 1857 05/11/19 1357  Weight: 68 kg 68 kg    Intake/Output: I/O last 3 completed shifts: In: 1826.5 [I.V.:1826.5] Out: 4174 [Urine:1650]   Intake/Output this shift:  No intake/output data recorded.  Physical Exam Constitutional:      Appearance: Normal appearance.  HENT:     Head: Normocephalic.     Nose: Nose normal.     Mouth/Throat:     Mouth: Mucous membranes are moist.  Eyes:     Conjunctiva/sclera: Conjunctivae normal.     Comments: Left prosthetic eye  Neck:     Musculoskeletal: Neck supple.  Cardiovascular:     Rate and Rhythm: Normal rate and regular rhythm.     Heart sounds: Normal heart sounds.  Pulmonary:     Effort: Pulmonary effort is normal.     Breath sounds: Normal breath sounds.  Abdominal:     General: Abdomen is flat. Bowel sounds are normal.     Palpations: Abdomen is soft.  Genitourinary:    Comments: Condom cathter in place Musculoskeletal:        General: No swelling or tenderness.     Comments: Trace edema  Neurological:     Mental Status: He is alert and oriented to person, place, and time. Mental status is at baseline.  Psychiatric:        Mood and Affect: Mood normal.      Basic Metabolic Panel: Recent Labs  Lab 05/09/19 1907 05/10/19 0405 05/11/19 1154 05/11/19 1700  05/12/19 0646  NA 135 141 142 140 143  K 5.0 5.0 5.5* 5.5* 5.0  CL 111 115* 118* 119* 116*  CO2 19* 22 17* 17* 21*  GLUCOSE 165* 81 82 96 94  BUN 54* 53* 60* 64* 56*  CREATININE 3.96* 3.95* 3.71* 3.77* 3.56*  CALCIUM 7.6* 7.8* 8.0* 7.7* 7.8*    Liver Function Tests: No results for input(s): AST, ALT, ALKPHOS, BILITOT, PROT, ALBUMIN in the last 168 hours. No results for input(s): LIPASE, AMYLASE in the last 168 hours. Recent Labs  Lab 05/10/19 1529  AMMONIA 19    CBC: Recent Labs  Lab 05/09/19 1907 05/10/19 0405 05/10/19 1814 05/11/19 1154 05/12/19 0646  WBC 4.7 5.3 8.2 7.8 6.2  NEUTROABS 2.9  --   --   --   --   HGB 7.6* 8.6* 8.8* 8.8* 8.8*  HCT 24.1* 26.9* 27.8* 27.7* 27.4*  MCV 91.6 89.7 93.0 91.1 90.7  PLT 141* 137* 136* 157 136*    Cardiac Enzymes: No results for input(s): CKTOTAL, CKMB, CKMBINDEX, TROPONINI in the last 168 hours.  BNP: Invalid input(s): POCBNP  CBG: Recent Labs  Lab 05/11/19 1502 05/11/19 1539 05/11/19 1649 05/11/19 2159 05/12/19 0749  GLUCAP 87 81 92 127* 103*    Microbiology: Results for orders placed  or performed during the hospital encounter of 05/09/19  SARS Coronavirus 2 [facility] order, Performed in [facility] hospital lab)     Status: None   Collection Time: 05/09/19  9:28 PM  Result Value Ref Range Status   SARS Coronavirus 2 NEGATIVE NEGATIVE Final    Comment: (NOTE) If result is NEGATIVE SARS-CoV-2 target nucleic acids are NOT DETECTED. The SARS-CoV-2 RNA is generally detectable in upper and lower  respiratory specimens during the acute phase of infection. The lowest  concentration of SARS-CoV-2 viral copies this assay can detect is 250  copies / mL. A negative result does not preclude SARS-CoV-2 infection  and should not be used as the sole basis for treatment or other  patient management decisions.  A negative result may occur with  improper specimen collection / handling, submission of specimen other  than  nasopharyngeal swab, presence of viral mutation(s) within the  areas targeted by this assay, and inadequate number of viral copies  (<250 copies / mL). A negative result must be combined with clinical  observations, patient history, and epidemiological information. If result is POSITIVE SARS-CoV-2 target nucleic acids are DETECTED. The SARS-CoV-2 RNA is generally detectable in upper and lower  respiratory specimens dur ing the acute phase of infection.  Positive  results are indicative of active infection with SARS-CoV-2.  Clinical  correlation with patient history and other diagnostic information is  necessary to determine patient infection status.  Positive results do  not rule out bacterial infection or co-infection with other viruses. If result is PRESUMPTIVE POSTIVE SARS-CoV-2 nucleic acids MAY BE PRESENT.   A presumptive positive result was obtained on the submitted specimen  and confirmed on repeat testing.  While 2019 novel coronavirus  (SARS-CoV-2) nucleic acids may be present in the submitted sample  additional confirmatory testing may be necessary for epidemiological  and / or clinical management purposes  to differentiate between  SARS-CoV-2 and other Sarbecovirus currently known to infect humans.  If clinically indicated additional testing with an alternate test  methodology [phone]) is advised. The SARS-CoV-2 RNA is generally  detectable in upper and lower respiratory sp ecimens during the acute  phase of infection. The expected result is Negative. Fact Sheet for Patients:  StrictlyIdeas.no Fact Sheet for Healthcare Providers: BankingDealers.co.za This test is not yet approved or cleared by the [place] FDA and has been authorized for detection and/or diagnosis of SARS-CoV-2 by FDA under an Emergency Use Authorization (EUA).  This EUA will remain in effect (meaning this test can be used) for the duration of  the COVID-19 declaration under Section 564(b)(1) of the Act, 21 U.S.C. section 360bbb-3(b)(1), unless the authorization is terminated or revoked sooner. Performed at [facility], [address] [place], [place] [address]     Coagulation Studies: Recent Labs    05/09/19 1907  LABPROT 15.3*  INR 1.2    Urinalysis: No results for input(s): COLORURINE, LABSPEC, PHURINE, GLUCOSEU, HGBUR, BILIRUBINUR, KETONESUR, PROTEINUR, UROBILINOGEN, NITRITE, LEUKOCYTESUR in the last 72 hours.  Invalid input(s): APPERANCEUR    Imaging: Ct Abdomen Pelvis Wo Contrast  Result Date: 05/10/2019 CLINICAL DATA:  Nausea, vomiting EXAM: CT ABDOMEN AND PELVIS WITHOUT CONTRAST TECHNIQUE: Multidetector CT imaging of the abdomen and pelvis was performed following the standard protocol without IV contrast. COMPARISON:  CT chest abdomen pelvis, 05/17/2017 FINDINGS: Examination is generally limited by lack of intravenous contrast, breath motion artifact, and streak artifact from patient arm positioning. Lower chest: Small bilateral pleural effusions with associated heterogeneous and ground-glass  airspace opacity. Stable, benign small pulmonary nodule of the left lung base. Cardiomegaly. Hepatobiliary: No solid liver abnormality is seen. No gallstones, gallbladder wall thickening, or biliary dilatation. Pancreas: Unremarkable. No pancreatic ductal dilatation or surrounding inflammatory changes. Spleen: Normal in size without significant abnormality. Adrenals/Urinary Tract: Adrenal glands are unremarkable. Unchanged perinephric fat stranding. Kidneys are otherwise normal, without renal calculi, solid lesion, or hydronephrosis. Bladder is unremarkable. Urinary bladder is distended. Stomach/Bowel: Stomach is within normal limits. Appendix appears normal. No evidence of bowel wall thickening, distention, or inflammatory changes. Moderate burden of stool in the colon. Vascular/Lymphatic: Aortic atherosclerosis. No  enlarged abdominal or pelvic lymph nodes. Reproductive: No mass or other significant abnormality. Other: Anasarca.  No abdominopelvic ascites. Musculoskeletal: No acute or significant osseous findings. Multiple nonacute fractures of the ribs. IMPRESSION: 1. Examination is generally limited by lack of intravenous contrast, breath motion artifact, and streak artifact from patient arm positioning. Within this limitation, no acute CT noncontrast findings of the abdomen or pelvis to explain vomiting. 2.  Distended urinary bladder.  Correlate for urinary retention. 3. Small bilateral pleural effusions with associated heterogeneous and ground-glass airspace opacity. 4.  Anasarca. 5.  Other chronic and incidental findings as detailed above. Electronically Signed   By: Eddie Candle M.D.   On: 05/10/2019 11:13     Medications:   . lactated ringers 75 mL/hr at 05/12/19 0954   . amLODipine  10 mg Oral Daily  . aspirin EC  81 mg Oral Daily  . atorvastatin  40 mg Oral QHS  . citalopram  20 mg Oral Daily  . clopidogrel  75 mg Oral Daily  . divalproex  500 mg Oral BID  . feeding supplement (GLUCERNA SHAKE)  237 mL Oral BID BM  . glipiZIDE  2.5 mg Oral BID  . insulin aspart  0-15 Units Subcutaneous TID WC  . insulin aspart  0-5 Units Subcutaneous QHS  . levothyroxine  100 mcg Oral Q0600   And  . levothyroxine  25 mcg Oral Q0600  . lipase/protease/amylase  12,000 Units Oral TID WC  . metoprolol tartrate  25 mg Oral BID  . multivitamin  1 tablet Oral QHS  . pantoprazole (PROTONIX) IV  40 mg Intravenous Q12H  . sodium bicarbonate  650 mg Oral BID  . tamsulosin  0.4 mg Oral Daily   acetaminophen **OR** acetaminophen, hydrALAZINE, ondansetron **OR** ondansetron (ZOFRAN) IV, polyethylene glycol, promethazine  Assessment/ Plan:  Cameron Adams is a 68 y.o. black male withLeft eye prosthesis, diabetes mellitus type 2, hypertension, syncope, and chronic kidney disease stage IV followed by [facility]  Nephrology, who was admitted to [facility] on 05/09/2019 for fall and possible seizure.   1. Acute renal failure on Chronic kidney disease stage IV with hyperkalemia and metabolic acidosis  Follows with [facility] Nephrology. Baseline Cr 3.0/GFR 25 from 04/06/2019 History of acute renal failure requiring hemodialylsis for several months. Has been off dialysis since 10/18/18.  Chronic kidney disease secondary to diabetic nephropathy and hypertension Acute renal failure secondary to prerenal azotemia and acute blood loss.  - Continue PO bicarbonate. Continue outpatient follow up   2. Hyperkalemia  May be related to iv fluid - ringers lactate If stays high, change iv fluids to normal saline  3. Anemia with chronic kidney disease/renal failure: with acute blood loss anemia. Status post PRBC transfusion on 8/3.  Lab Results  Component Value Date   HGB 8.8 (L) 05/12/2019      LOS: 3 Cameron Adams 8/6/202011:02 AM

## 2019-05-12 NOTE — Progress Notes (Signed)
[place] [facility] [place] NAME: Valin Massie    MR#:  [id number]  DATE OF BIRTH:  [birth year]  SUBJECTIVE:  CHIEF COMPLAINT:   Chief Complaint  Patient presents with  . Fall  Some diarrhea today, renal function improving, he is sleepy REVIEW OF SYSTEMS:  Review of Systems  Constitutional: Negative for diaphoresis, fever, malaise/fatigue and weight loss.  HENT: Negative for ear discharge, ear pain, hearing loss, nosebleeds, sore throat and tinnitus.   Eyes: Negative for blurred vision and pain.  Respiratory: Negative for cough, hemoptysis, shortness of breath and wheezing.   Cardiovascular: Negative for chest pain, palpitations, orthopnea and leg swelling.  Gastrointestinal: Negative for abdominal pain, blood in stool, constipation, diarrhea, heartburn, nausea and vomiting.  Genitourinary: Negative for dysuria, frequency and urgency.  Musculoskeletal: Negative for back pain and myalgias.  Skin: Negative for itching and rash.  Neurological: Negative for dizziness, tingling, tremors, focal weakness, seizures, weakness and headaches.  Psychiatric/Behavioral: Negative for depression. The patient is not nervous/anxious.     DRUG ALLERGIES:   Allergies  Allergen Reactions  . Gabapentin     Other reaction(s): Other (See Comments) Trouble sleeping    VITALS:  Blood pressure (!) 156/64, pulse (!) 52, temperature 98.3 F (36.8 C), temperature source Oral, resp. rate 20, height 5\' 11"  (1.803 m), weight 68 kg, SpO2 97 %. PHYSICAL EXAMINATION:  Physical Exam HENT:     Head: Normocephalic and atraumatic.  Eyes:     Conjunctiva/sclera: Conjunctivae normal.     Pupils: Pupils are equal, round, and reactive to light.  Neck:     Musculoskeletal: Normal range of motion and neck supple.     Thyroid: No thyromegaly.     Trachea: No tracheal deviation.  Cardiovascular:     Rate and Rhythm: Normal rate and regular rhythm.     Heart sounds:  Normal heart sounds.  Pulmonary:     Effort: Pulmonary effort is normal. No respiratory distress.     Breath sounds: Normal breath sounds. No wheezing.  Chest:     Chest wall: No tenderness.  Abdominal:     General: Bowel sounds are normal. There is no distension.     Palpations: Abdomen is soft.     Tenderness: There is no abdominal tenderness.  Musculoskeletal: Normal range of motion.  Skin:    General: Skin is warm and dry.     Findings: No rash.  Neurological:     Cranial Nerves: No cranial nerve deficit.     Comments: Sleepy today  Psychiatric:     Comments: Unable to evaluate as he is sleepy    LABORATORY PANEL:  Male CBC Recent Labs  Lab 05/12/19 0646  WBC 6.2  HGB 8.8*  HCT 27.4*  PLT 136*   ------------------------------------------------------------------------------------------------------------------ Chemistries  Recent Labs  Lab 05/12/19 0646  NA 143  K 5.0  CL 116*  CO2 21*  GLUCOSE 94  BUN 56*  CREATININE 3.56*  CALCIUM 7.8*   RADIOLOGY:  No results found. ASSESSMENT AND PLAN:  68 y.o.malewith a known history of diabetes mellitus, CKD, diastolic CHF, history of seizure, hypothyroidism, and history of urinary retention admitted for AMS. AMS most likely multifactoriel includingi metabolic/electrolytes abnties, infections. Pateitn has a hs/o of sz on depakote.  1.  Acute blood loss anemia -It is post EGD showing previous bleeding evidence from Mallory-Weiss tear.  Nothing acute -  Status post 1 unit packed red blood  cells on this admission -H&H remained stable  2.  Acute on chronic renal failure: Creatinine still at 3.56 although improved from admission not quite at baseline yet -  nephrology following, renal function improving -As now he is having some diarrhea would like to hold off discharge as his creatinine can get worse very quickly  3.  Nausea/vomiting -Resolved  4.  Diabetes mellitus - Moderate sliding scale insulin  5.  Acute  metabolic encephalopathy -Seem to be back to his baseline mental status - Continue depakote 500 mg bid.  Normal ammonia level   therapist recommends rehab    All the records are reviewed and case discussed with Care Management/Social Worker. Management plans discussed with the patient, family (discussed with his daughter Elwin Sleight over phone) and they are in agreement.  CODE STATUS: Full Code  TOTAL TIME TAKING CARE OF THIS PATIENT: 35 minutes.   More than 50% of the time was spent in counseling/coordination of care: YES  POSSIBLE D/C IN 1-2 DAYS, DEPENDING ON CLINICAL CONDITION.   Max Sane M.D on 05/12/2019 at 11:36 AM  Between 7am to 6pm - Pager - [phone]  After 6pm go to [email or web address] - Proofreader  [facility]  Office  [phone]  CC: Primary care physician; [name], MD  Note: This dictation was prepared with Dragon dictation along with smaller phrase technology. Any transcriptional errors that result from this process are unintentional.

## 2019-05-13 LAB — BASIC METABOLIC PANEL
Anion gap: 5 (ref 5–15)
BUN: 50 mg/dL — ABNORMAL HIGH (ref 8–23)
CO2: 21 mmol/L — ABNORMAL LOW (ref 22–32)
Calcium: 7.7 mg/dL — ABNORMAL LOW (ref 8.9–10.3)
Chloride: 114 mmol/L — ABNORMAL HIGH (ref 98–111)
Creatinine, Ser: 3.21 mg/dL — ABNORMAL HIGH (ref 0.61–1.24)
GFR calc Af Amer: 22 mL/min — ABNORMAL LOW (ref >60)
GFR calc non Af Amer: 19 mL/min — ABNORMAL LOW (ref >60)
Glucose, Bld: 154 mg/dL — ABNORMAL HIGH (ref 70–99)
Potassium: 5.3 mmol/L — ABNORMAL HIGH (ref 3.5–5.1)
Sodium: 140 mmol/L (ref 135–145)

## 2019-05-13 LAB — GLUCOSE, CAPILLARY
Glucose-Capillary: 115 mg/dL — ABNORMAL HIGH (ref 70–99)
Glucose-Capillary: 137 mg/dL — ABNORMAL HIGH (ref 70–99)
Glucose-Capillary: 131 mg/dL — ABNORMAL HIGH (ref 70–99)

## 2019-05-13 LAB — CBC
HCT: 25.8 % — ABNORMAL LOW (ref 39.0–52.0)
Hemoglobin: 8.2 g/dL — ABNORMAL LOW (ref 13.0–17.0)
MCH: 29.1 pg (ref 26.0–34.0)
MCHC: 31.8 g/dL (ref 30.0–36.0)
MCV: 91.5 fL (ref 80.0–100.0)
Platelets: 120 10*3/uL — ABNORMAL LOW (ref 150–400)
RBC: 2.82 MIL/uL — ABNORMAL LOW (ref 4.22–5.81)
RDW: 14.2 % (ref 11.5–15.5)
WBC: 5.6 10*3/uL (ref 4.0–10.5)
nRBC: 0 % (ref 0.0–0.2)

## 2019-05-13 MED ORDER — SODIUM ZIRCONIUM CYCLOSILICATE 10 G PO PACK
10.0000 g | PACK | Freq: Every day | ORAL | Status: DC
  Administered 2019-05-13 – 2019-05-21 (×9): 10 g via ORAL
  Filled 2019-05-13 (×9): qty 1

## 2019-05-13 NOTE — Progress Notes (Signed)
Physical Therapy Treatment Patient Details Name: Cameron Adams MRN: [id number] DOB: 1951-02-05 Today's Date: 05/13/2019    History of Present Illness 68 y.o. male with a known history of diabetes mellitus, CKD, diastolic CHF, history of seizure, hypothyroidism, CVA, Lt prosthetic eye, and history of urinary retention.  Patient presented to the emergency room after sustaining a fall while attempting to get into his wheelchair, apparently he hit his R eye at this time as well. Pt has been at [facility] for STR since prior admission.    PT Comments    Pt with significant improvement with mobility, safety, awareness, ambulation, etc this date.  He was able to do multiple in-room loops with walker amounting to ~75 ft with some fatigue but no phyiscal assist or overt safety issues.  He was much more interactive with this PT (did eval 8/4; vastly different mental status at that time) and able to participate fully.  Pt with good effort during ambulation, exercises and generally showed enough mobility and safety that home is a realistic d/c plan at this point.  Daughter is home with him and able to assist t/o the day, will benefit from tub bench as he reports getting in/out of shower tub has been more and more difficult.  Pt very interested in HHPT which this PT fully endorses.  Follow Up Recommendations  Home health PT;Supervision - Intermittent     Equipment Recommendations  (tub bench)    Recommendations for Other Services       Precautions / Restrictions Precautions Precautions: Fall Precaution Comments: Hx of seizures Restrictions Weight Bearing Restrictions: No    Mobility  Bed Mobility Overal bed mobility: Modified Independent Bed Mobility: Supine to Sit;Sit to Supine     Supine to sit: Supervision Sit to supine: Min guard   General bed mobility comments: Pt was able to get himself to sitting EOB w/o direct assist, CGA to guide LEs back into bed, though he was able to lift them up w/o  assist  Transfers Overall transfer level: Needs assistance Equipment used: Rolling walker (2 wheeled) Transfers: Sit to/from Stand Sit to Stand: Supervision         General transfer comment: minimally raised bed to standard seat height, showed good effort to rise and stability using walkeronce up  Ambulation/Gait Ambulation/Gait assistance: Min guard Gait Distance (Feet): 75 Feet Assistive device: Rolling walker (2 wheeled)       General Gait Details: Pt with much safer and more confident ambulation this date.  He was able to maintain appropriate use of walker, had though cadence was slow he had no safety issues with stable gait (including multiple turns w/o safety concerns.)  Overall pt did much better than expected and reports being relatively close to baseline, some mild fatigue with the effort but no dizziness or orthostatic symptoms.   Stairs             Wheelchair Mobility    Modified Rankin (Stroke Patients Only)       Balance Overall balance assessment: Needs assistance Sitting-balance support: Bilateral upper extremity supported Sitting balance-Leahy Scale: Good Sitting balance - Comments: Pt able to maintain balance well this session    Standing balance support: Bilateral upper extremity supported(using RW) Standing balance-Leahy Scale: Good Standing balance comment: good overall use of walker, significant use that was not excessive to maintain balance  Cognition Arousal/Alertness: Awake/alert Behavior During Therapy: WFL for tasks assessed/performed Overall Cognitive Status: Within Functional Limits for tasks assessed                                        Exercises General Exercises - Lower Extremity Ankle Circles/Pumps: Strengthening;10 reps Short Arc Quad: Strengthening;10 reps Heel Slides: Strengthening;10 reps(with resisted leg extensions (slightly more ROM in R)) Hip ABduction/ADduction:  Strengthening;10 reps Straight Leg Raises: AROM;10 reps Hip Flexion/Marching: AROM;10 reps    General Comments        Pertinent Vitals/Pain Pain Assessment: No/denies pain    Home Living                      Prior Function            PT Goals (current goals can now be found in the care plan section) Progress towards PT goals: Progressing toward goals    Frequency    Min 2X/week      PT Plan Discharge plan needs to be updated    Co-evaluation              AM-PAC PT "6 Clicks" Mobility   Outcome Measure  Help needed turning from your back to your side while in a flat bed without using bedrails?: None Help needed moving from lying on your back to sitting on the side of a flat bed without using bedrails?: None Help needed moving to and from a bed to a chair (including a wheelchair)?: A Little Help needed standing up from a chair using your arms (e.g., wheelchair or bedside chair)?: A Little Help needed to walk in hospital room?: A Little Help needed climbing 3-5 steps with a railing? : A Lot 6 Click Score: 19    End of Session Equipment Utilized During Treatment: Gait belt Activity Tolerance: Patient tolerated treatment well Patient left: with bed alarm set;with call bell/phone within reach   PT Visit Diagnosis: Other abnormalities of gait and mobility (R26.89);Muscle weakness (generalized) (M62.81);Difficulty in walking, not elsewhere classified (R26.2)     Time: 1401-1430 PT Time Calculation (min) (ACUTE ONLY): 29 min  Charges:  $Gait Training: 8-22 mins $Therapeutic Exercise: 8-22 mins                     Kreg Shropshire, DPT 05/13/2019, 2:52 PM

## 2019-05-13 NOTE — Progress Notes (Addendum)
[place] at [place] NAME: Cameron Adams    MR#:  [id number]  DATE OF BIRTH:  05-07-1951  SUBJECTIVE:  CHIEF COMPLAINT:   Chief Complaint  Patient presents with  . Fall  Continues to have diarrhea, potassium going up, renal function slowly improving.  Patient is sleepy REVIEW OF SYSTEMS:  Review of Systems  Constitutional: Negative for diaphoresis, fever, malaise/fatigue and weight loss.  HENT: Negative for ear discharge, ear pain, hearing loss, nosebleeds, sore throat and tinnitus.   Eyes: Negative for blurred vision and pain.  Respiratory: Negative for cough, hemoptysis, shortness of breath and wheezing.   Cardiovascular: Negative for chest pain, palpitations, orthopnea and leg swelling.  Gastrointestinal: Negative for abdominal pain, blood in stool, constipation, diarrhea, heartburn, nausea and vomiting.  Genitourinary: Negative for dysuria, frequency and urgency.  Musculoskeletal: Negative for back pain and myalgias.  Skin: Negative for itching and rash.  Neurological: Negative for dizziness, tingling, tremors, focal weakness, seizures, weakness and headaches.  Psychiatric/Behavioral: Negative for depression. The patient is not nervous/anxious.     DRUG ALLERGIES:   Allergies  Allergen Reactions  . Gabapentin     Other reaction(s): Other (See Comments) Trouble sleeping    VITALS:  Blood pressure (!) 111/57, pulse (!) 57, temperature 98.3 F (36.8 C), temperature source Oral, resp. rate 14, height 5\' 11"  (1.803 m), weight 68 kg, SpO2 99 %. PHYSICAL EXAMINATION:  Physical Exam HENT:     Head: Normocephalic and atraumatic.  Eyes:     Conjunctiva/sclera: Conjunctivae normal.     Pupils: Pupils are equal, round, and reactive to light.  Neck:     Musculoskeletal: Normal range of motion and neck supple.     Thyroid: No thyromegaly.     Trachea: No tracheal deviation.  Cardiovascular:     Rate and Rhythm: Normal rate  and regular rhythm.     Heart sounds: Normal heart sounds.  Pulmonary:     Effort: Pulmonary effort is normal. No respiratory distress.     Breath sounds: Normal breath sounds. No wheezing.  Chest:     Chest wall: No tenderness.  Abdominal:     General: Bowel sounds are normal. There is no distension.     Palpations: Abdomen is soft.     Tenderness: There is no abdominal tenderness.  Musculoskeletal: Normal range of motion.  Skin:    General: Skin is warm and dry.     Findings: No rash.  Neurological:     Cranial Nerves: No cranial nerve deficit.     Comments: Sleepy today  Psychiatric:     Comments: Unable to evaluate as he is sleepy    LABORATORY PANEL:  Male CBC Recent Labs  Lab 05/13/19 0635  WBC 5.6  HGB 8.2*  HCT 25.8*  PLT 120*   ------------------------------------------------------------------------------------------------------------------ Chemistries  Recent Labs  Lab 05/13/19 0529  NA 140  K 5.3*  CL 114*  CO2 21*  GLUCOSE 154*  BUN 50*  CREATININE 3.21*  CALCIUM 7.7*   RADIOLOGY:  No results found. ASSESSMENT AND PLAN:  68 y.o.malewith a known history of diabetes mellitus, CKD, diastolic CHF, history of seizure, hypothyroidism, and history of urinary retention admitted for AMS. AMS most likely multifactoriel includingi metabolic/electrolytes abnties, infections. Pateitn has a hs/o of sz on depakote.  1.  Acute blood loss anemia -It is post EGD showing previous bleeding evidence from Mallory-Weiss tear.  Nothing acute -  Status  post 1 unit packed red blood cells on this admission -H&H remained stable.  Hemoglobin 8.2  2.  Acute on chronic renal failure: Creatinine 3.21 -slowly improving -  nephrology following, renal function improving -As now he is having some diarrhea would like to hold off discharge as his creatinine can get worse very quickly  3.  Nausea/vomiting -Resolved  4.  Diabetes mellitus - Moderate sliding scale insulin   5.  Acute metabolic encephalopathy -Seem to be back to his baseline mental status - Continue depakote 500 mg bid.  Normal ammonia level  6.  Hyperkalemia -We will give Lokelma  7. Diarrhea: check c.diff. if neg, can consider checking GI Panel   therapist recommends rehab    All the records are reviewed and case discussed with Care Management/Social Worker. Management plans discussed with the patient, nursing and they are in agreement.  CODE STATUS: Full Code  TOTAL TIME TAKING CARE OF THIS PATIENT: 35 minutes.   More than 50% of the time was spent in counseling/coordination of care: YES  POSSIBLE D/C IN 1-2 DAYS, DEPENDING ON CLINICAL CONDITION.   Max Sane M.D on 05/13/2019 at 1:20 PM  Between 7am to 6pm - Pager - [phone]  After 6pm go to [email or web address] - Proofreader  [facility]  Office  [phone]  CC: Primary care physician; [name], MD  Note: This dictation was prepared with Dragon dictation along with smaller phrase technology. Any transcriptional errors that result from this process are unintentional.

## 2019-05-13 NOTE — TOC Progression Note (Signed)
Transition of Care [facility]) - Progression Note    Patient Details  Name: Cameron Adams MRN: [id number] Date of Birth: Mar 10, 1951  Transition of Care [facility] CM/SW Hannah, LCSW Phone Number: 05/13/2019, 2:33 PM  Clinical Narrative: CSW spoke with PT who said patient showed improvement in mobility today. He walked 75 feet in the room and he recommended home health. Discussed with daughter [name] and she said she wants him to finish his 20 days at [facility] before returning home with her sister. He has about 5 rehab days left before his copays start. Per MD, patient will be ready for discharge possibly early next week.   Expected Discharge Plan: Skilled Nursing Facility Barriers to Discharge: Continued Medical Work up  Expected Discharge Plan and Services Expected Discharge Plan: [place] Choice: [place] arrangements for the past 2 months: [place], Single Family Home Expected Discharge Date: 05/13/19                                     Social Determinants of Health (SDOH) Interventions    Readmission Risk Interventions Readmission Risk Prevention Plan 04/26/2019 01/16/2019  Transportation Screening Complete Complete  PCP or Specialist Appt within 3-5 Days - Complete  HRI or [place] - Complete  Social Work Consult for [place] Planning/Counseling - Complete  Palliative Care Screening - Not Applicable  Medication Review Press photographer) Complete Complete  HRI or Home Care Consult Complete -  SW Recovery Care/Counseling Consult Complete -  Palliative Care Screening Not Applicable -  [place] Patient Refused -  Some recent data might be hidden

## 2019-05-14 LAB — C DIFFICILE QUICK SCREEN W PCR REFLEX
C Diff antigen: POSITIVE — AB
C Diff toxin: NEGATIVE

## 2019-05-14 LAB — GLUCOSE, CAPILLARY
Glucose-Capillary: 114 mg/dL — ABNORMAL HIGH (ref 70–99)
Glucose-Capillary: 131 mg/dL — ABNORMAL HIGH (ref 70–99)
Glucose-Capillary: 106 mg/dL — ABNORMAL HIGH (ref 70–99)
Glucose-Capillary: 230 mg/dL — ABNORMAL HIGH (ref 70–99)

## 2019-05-14 LAB — CBC
HCT: 25.7 % — ABNORMAL LOW (ref 39.0–52.0)
Hemoglobin: 8.2 g/dL — ABNORMAL LOW (ref 13.0–17.0)
MCH: 29.2 pg (ref 26.0–34.0)
MCHC: 31.9 g/dL (ref 30.0–36.0)
MCV: 91.5 fL (ref 80.0–100.0)
Platelets: 121 10*3/uL — ABNORMAL LOW (ref 150–400)
RBC: 2.81 MIL/uL — ABNORMAL LOW (ref 4.22–5.81)
RDW: 14.1 % (ref 11.5–15.5)
WBC: 7.4 10*3/uL (ref 4.0–10.5)
nRBC: 0 % (ref 0.0–0.2)

## 2019-05-14 LAB — BASIC METABOLIC PANEL
Anion gap: 4 — ABNORMAL LOW (ref 5–15)
BUN: 47 mg/dL — ABNORMAL HIGH (ref 8–23)
CO2: 20 mmol/L — ABNORMAL LOW (ref 22–32)
Calcium: 7.5 mg/dL — ABNORMAL LOW (ref 8.9–10.3)
Chloride: 115 mmol/L — ABNORMAL HIGH (ref 98–111)
Creatinine, Ser: 3.12 mg/dL — ABNORMAL HIGH (ref 0.61–1.24)
GFR calc Af Amer: 23 mL/min — ABNORMAL LOW (ref >60)
GFR calc non Af Amer: 19 mL/min — ABNORMAL LOW (ref >60)
Glucose, Bld: 134 mg/dL — ABNORMAL HIGH (ref 70–99)
Potassium: 4.7 mmol/L (ref 3.5–5.1)
Sodium: 139 mmol/L (ref 135–145)

## 2019-05-14 LAB — CLOSTRIDIUM DIFFICILE BY PCR, REFLEXED: Toxigenic C. Difficile by PCR: POSITIVE — AB

## 2019-05-14 MED ORDER — VANCOMYCIN 50 MG/ML ORAL SOLUTION
125.0000 mg | Freq: Four times a day (QID) | ORAL | Status: DC
  Administered 2019-05-14 – 2019-05-17 (×13): 125 mg via ORAL
  Filled 2019-05-14 (×17): qty 2.5

## 2019-05-14 NOTE — Progress Notes (Signed)
[place] at [place] NAME: Cameron Adams    MR#:  [id number]  DATE OF BIRTH:  [birth year]  SUBJECTIVE:  CHIEF COMPLAINT:   Chief Complaint  Patient presents with  . Fall  Patient is awake and sitting on the chair Has watery stools and diarrhea Mild abdominal discomfort No vomitings No fever REVIEW OF SYSTEMS:   Review of Systems  Constitutional: Has generalized weakness HENT: Negative.   Eyes: Negative.   Respiratory: Negative.   Cardiovascular: Negative.   Gastrointestinal: Has diarrhea Genitourinary: Negative.   Musculoskeletal: Negative.   Skin: Negative.   Neurological: Negative.   Endo/Heme/Allergies: Negative.   Psychiatric/Behavioral: Negative.   All other systems reviewed and are negative. DRUG ALLERGIES:   Allergies  Allergen Reactions  . Gabapentin     Other reaction(s): Other (See Comments) Trouble sleeping    VITALS:  Blood pressure (!) 162/73, pulse (!) 58, temperature 97.8 F (36.6 C), temperature source Oral, resp. rate 16, height 5\' 11"  (1.803 m), weight 68 kg, SpO2 97 %. PHYSICAL EXAMINATION:  GENERAL: Pleasant-appearing in no apparent distress.  HEAD, EYES, EARS, NOSE AND THROAT: Atraumatic, normocephalic. Extraocular muscles are intact. Pupils equal and reactive to light. Sclerae anicteric. No conjunctival injection. No oro-pharyngeal erythema.  NECK: Supple. There is no jugular venous distention. No bruits, no lymphadenopathy, no thyromegaly.  HEART: Regular rate and rhythm, tachycardic. No murmurs, no rubs, no clicks.  LUNGS: Clear to auscultation bilaterally. No rales or rhonchi. No wheezes.  ABDOMEN: Soft, flat, tenderness noted around the umbilicus, nondistended. Has good bowel sounds. No hepatosplenomegaly appreciated.  EXTREMITIES: No evidence of any cyanosis, clubbing, or peripheral edema.  +2 pedal and radial pulses bilaterally.  NEUROLOGIC: The patient is alert, awake, and oriented x3  with no focal motor or sensory deficits appreciated bilaterally.  SKIN: Moist and warm with no rashes appreciated.  LYMPHATIC: No cervical or axillary lymphadenopathy.    LABORATORY PANEL:  Male CBC Recent Labs  Lab 05/13/19 0635  WBC 5.6  HGB 8.2*  HCT 25.8*  PLT 120*   ------------------------------------------------------------------------------------------------------------------ Chemistries  Recent Labs  Lab 05/13/19 0529  NA 140  K 5.3*  CL 114*  CO2 21*  GLUCOSE 154*  BUN 50*  CREATININE 3.21*  CALCIUM 7.7*   RADIOLOGY:  No results found. ASSESSMENT AND PLAN:  68 y.o.malewith a known history of diabetes mellitus, CKD, diastolic CHF, history of seizure, hypothyroidism, and history of urinary retention admitted for AMS. AMS most likely multifactoriel includingi metabolic/electrolytes abnties, infections. Pateitn has a hs/o of sz on depakote.  1.  Acute blood loss anemia -Staus post EGD showing previous bleeding evidence from Mallory-Weiss tear.  Nothing acute -  Status post 1 unit packed red blood cells on this admission -H&H remained stable.  Hemoglobin 8.2 Monitor hemoglobin hematocrit  2.  Acute C. difficile colitis Patient started on oral vancomycin Enteric precautions  3.  Acute on chronic renal failure: Creatinine 3.21 -slowly improving -  nephrology following, renal function improving  4.  Nausea/vomiting -Resolved  5.  Diabetes mellitus - Moderate sliding scale insulin  6.  Acute metabolic encephalopathy Improved -Seem to be back to his baseline mental status - Continue depakote 500 mg bid.  Normal ammonia level  7.  Hyperkalemia -Patient was given [place]  therapist recommends rehab  All the records are reviewed and case discussed with Care Management/Social Worker. Management plans discussed with the patient, nursing and they are  in agreement.  CODE STATUS: Full Code  TOTAL TIME TAKING CARE OF THIS PATIENT: 36 minutes.    More than 50% of the time was spent in counseling/coordination of care: YES  POSSIBLE D/C IN 1-2 DAYS, DEPENDING ON CLINICAL CONDITION.   Saundra Shelling M.D on 05/14/2019 at 1:21 PM  Between 7am to 6pm - Pager - [phone]  After 6pm go to [email or web address] - Proofreader  [facility] [place] [facility]  Office  [phone]  CC: Primary care physician; [name], MD  Note: This dictation was prepared with Dragon dictation along with smaller phrase technology. Any transcriptional errors that result from this process are unintentional.

## 2019-05-14 NOTE — Progress Notes (Signed)
Attempted to return call to patient's daughter [name] (was unable to speak with her when she called the unit and when she called in the room on patient's phone as I was in the middle of giving meds to her father in gown and gloves due to C-diff precautions and had tech waiting on me to help clean up of bowel movement for x2 person assist in room 224) after called the number she left and that is on the chart - no answer, fast busy signal after initial ring. Will continue to try to reach her.

## 2019-05-15 LAB — BASIC METABOLIC PANEL
Anion gap: 4 — ABNORMAL LOW (ref 5–15)
BUN: 48 mg/dL — ABNORMAL HIGH (ref 8–23)
CO2: 20 mmol/L — ABNORMAL LOW (ref 22–32)
Calcium: 7.7 mg/dL — ABNORMAL LOW (ref 8.9–10.3)
Chloride: 119 mmol/L — ABNORMAL HIGH (ref 98–111)
Creatinine, Ser: 3.2 mg/dL — ABNORMAL HIGH (ref 0.61–1.24)
GFR calc Af Amer: 22 mL/min — ABNORMAL LOW (ref >60)
GFR calc non Af Amer: 19 mL/min — ABNORMAL LOW (ref >60)
Glucose, Bld: 53 mg/dL — ABNORMAL LOW (ref 70–99)
Potassium: 4.9 mmol/L (ref 3.5–5.1)
Sodium: 143 mmol/L (ref 135–145)

## 2019-05-15 LAB — GLUCOSE, CAPILLARY
Glucose-Capillary: 46 mg/dL — ABNORMAL LOW (ref 70–99)
Glucose-Capillary: 74 mg/dL (ref 70–99)
Glucose-Capillary: 72 mg/dL (ref 70–99)
Glucose-Capillary: 73 mg/dL (ref 70–99)
Glucose-Capillary: 111 mg/dL — ABNORMAL HIGH (ref 70–99)

## 2019-05-15 MED ORDER — PNEUMOCOCCAL VAC POLYVALENT 25 MCG/0.5ML IJ INJ
0.5000 mL | INJECTION | INTRAMUSCULAR | Status: DC
  Filled 2019-05-15: qty 0.5

## 2019-05-15 MED ORDER — DEXTROSE-NACL 5-0.9 % IV SOLN
INTRAVENOUS | Status: DC
  Administered 2019-05-15 – 2019-05-16 (×2): via INTRAVENOUS

## 2019-05-15 NOTE — Progress Notes (Signed)
Went into patient's room to restart maintenance fluids and found him on the floor mat on his bottom between side rails of low bed. Patient was confused, but knew what month it was and where he was. Vital signs were taken and they were within normal limits. Bed alarm was also activated before patient was found on floor mat. Nursing supervisor, Webb Silversmith, was notified and told me to just put in a note because a fall did not occur due to the floor mats being in place.  Will continue to monitor.  Christene Slates 05/15/2019  3:13 AM

## 2019-05-15 NOTE — Progress Notes (Signed)
[place] at [place] NAME: Cameron Adams    MR#:  [id number]  DATE OF BIRTH:  11/08/1950  SUBJECTIVE:  CHIEF COMPLAINT:   Chief Complaint  Patient presents with  . Fall  Patient is awake and sitting on the chair Has decreased watery stools Blood sugars have been better this morning No vomitings Mild abdominal discomfort No fever REVIEW OF SYSTEMS:   Review of Systems  Constitutional: Has generalized weakness HENT: Negative.   Eyes: Negative.   Respiratory: Negative.   Cardiovascular: Negative.   Gastrointestinal: Has diarrhea Genitourinary: Negative.   Musculoskeletal: Negative.   Skin: Negative.   Neurological: Negative.   Endo/Heme/Allergies: Negative.   Psychiatric/Behavioral: Negative.   All other systems reviewed and are negative. DRUG ALLERGIES:   Allergies  Allergen Reactions  . Gabapentin     Other reaction(s): Other (See Comments) Trouble sleeping    VITALS:  Blood pressure (!) 162/68, pulse (!) 57, temperature 98.2 F (36.8 C), temperature source Oral, resp. rate 16, height 5\' 11"  (1.803 m), weight 68 kg, SpO2 97 %. PHYSICAL EXAMINATION:  GENERAL: Pleasant-appearing in no apparent distress.  HEAD, EYES, EARS, NOSE AND THROAT: Atraumatic, normocephalic. Extraocular muscles are intact. Pupils equal and reactive to light. Sclerae anicteric. No conjunctival injection. No oro-pharyngeal erythema.  NECK: Supple. There is no jugular venous distention. No bruits, no lymphadenopathy, no thyromegaly.  HEART: Regular rate and rhythm, tachycardic. No murmurs, no rubs, no clicks.  LUNGS: Clear to auscultation bilaterally. No rales or rhonchi. No wheezes.  ABDOMEN: Soft, flat, tenderness noted around the umbilicus, nondistended. Has good bowel sounds. No hepatosplenomegaly appreciated.  EXTREMITIES: No evidence of any cyanosis, clubbing, or peripheral edema.  +2 pedal and radial pulses bilaterally.  NEUROLOGIC: The  patient is alert, awake, and oriented x3 with no focal motor or sensory deficits appreciated bilaterally.  SKIN: Moist and warm with no rashes appreciated.  LYMPHATIC: No cervical or axillary lymphadenopathy.    LABORATORY PANEL:  Male CBC Recent Labs  Lab 05/14/19 1454  WBC 7.4  HGB 8.2*  HCT 25.7*  PLT 121*   ------------------------------------------------------------------------------------------------------------------ Chemistries  Recent Labs  Lab 05/15/19 0528  NA 143  K 4.9  CL 119*  CO2 20*  GLUCOSE 53*  BUN 48*  CREATININE 3.20*  CALCIUM 7.7*   RADIOLOGY:  No results found. ASSESSMENT AND PLAN:  68 y.o.malewith a known history of diabetes mellitus, CKD, diastolic CHF, history of seizure, hypothyroidism, and history of urinary retention admitted for AMS. AMS most likely multifactoriel includingi metabolic/electrolytes abnties, infections. Pateitn has a hs/o of sz on depakote.  1.  Acute blood loss anemia -Staus post EGD showing previous bleeding evidence from Mallory-Weiss tear.  Nothing acute -  Status post 1 unit packed red blood cells on this admission -H&H remained stable.  Hemoglobin 8.2 Monitor hemoglobin hematocrit  2.  Acute C. difficile colitis Improving slowly Patient started on oral vancomycin Enteric precautions to continue  3.  Acute on chronic renal failure: Creatinine 3.20-slowly improving -  nephrology following, renal function improving -IV fluids to continue  4.  Nausea/vomiting -Resolved  5.  Diabetes mellitus - Moderate sliding scale insulin  6.  Acute metabolic encephalopathy Improved -Seem to be back to his baseline mental status - Continue depakote 500 mg bid.  Normal ammonia level  7.  Hyperkalemia -Patient was given Lokelma Lokelma  8.  Hypoglycemia IV D5 normal saline infusion Monitor blood sugars closely Hold  hypoglycemic drugs Hold insulin sliding scale coverage  therapist recommends rehab  All the  records are reviewed and case discussed with Care Management/Social Worker. Management plans discussed with the patient, nursing and they are in agreement.  CODE STATUS: Full Code  TOTAL TIME TAKING CARE OF THIS PATIENT: 37 minutes.   More than 50% of the time was spent in counseling/coordination of care: YES  POSSIBLE D/C IN 1-2 DAYS, DEPENDING ON CLINICAL CONDITION.   Saundra Shelling M.D on 05/15/2019 at 12:29 PM  Between 7am to 6pm - Pager - [phone]  After 6pm go to [email or web address] - Proofreader  [facility] [place] [facility]  Office  [phone]  CC: Primary care physician; [name], MD  Note: This dictation was prepared with Dragon dictation along with smaller phrase technology. Any transcriptional errors that result from this process are unintentional.

## 2019-05-16 LAB — BASIC METABOLIC PANEL
Anion gap: 3 — ABNORMAL LOW (ref 5–15)
BUN: 44 mg/dL — ABNORMAL HIGH (ref 8–23)
CO2: 21 mmol/L — ABNORMAL LOW (ref 22–32)
Calcium: 7.7 mg/dL — ABNORMAL LOW (ref 8.9–10.3)
Chloride: 117 mmol/L — ABNORMAL HIGH (ref 98–111)
Creatinine, Ser: 3.05 mg/dL — ABNORMAL HIGH (ref 0.61–1.24)
GFR calc Af Amer: 23 mL/min — ABNORMAL LOW (ref >60)
GFR calc non Af Amer: 20 mL/min — ABNORMAL LOW (ref >60)
Glucose, Bld: 95 mg/dL (ref 70–99)
Potassium: 5.4 mmol/L — ABNORMAL HIGH (ref 3.5–5.1)
Sodium: 141 mmol/L (ref 135–145)

## 2019-05-16 LAB — GLUCOSE, CAPILLARY
Glucose-Capillary: 102 mg/dL — ABNORMAL HIGH (ref 70–99)
Glucose-Capillary: 209 mg/dL — ABNORMAL HIGH (ref 70–99)
Glucose-Capillary: 150 mg/dL — ABNORMAL HIGH (ref 70–99)
Glucose-Capillary: 184 mg/dL — ABNORMAL HIGH (ref 70–99)

## 2019-05-16 NOTE — Progress Notes (Signed)
PT Cancellation Note  Patient Details Name: Cameron Adams MRN: [id number] DOB: 06-30-1951   Cancelled Treatment:    Reason Eval/Treat Not Completed: Patient not medically ready;Medical issues which prohibited therapy(Chart reviewed, K+ 5.4 this date, outside of safe range to PT treatment. Will resume PT services once pt is medically ready.)  8:29 AM, 05/16/19 Etta Grandchild, PT, DPT Physical Therapist - [facility]  [phone] ([place])    Buccola,Allan C 05/16/2019, 8:29 AM

## 2019-05-16 NOTE — Care Management Important Message (Signed)
Important Message  Patient Details  Name: Cameron Adams MRN: [id number] Date of Birth: 01-28-1951   Medicare Important Message Given:  Yes     Candie Chroman, LCSW 05/16/2019, 12:25 PM

## 2019-05-16 NOTE — Progress Notes (Signed)
[facility]  ROUNDING NOTE   Subjective:  Patient seen at bedside this AM. Eating breakfast. Creatinine down a bit to 3.05.    Objective:  Vital signs in last 24 hours:  Temp:  [98.2 F (36.8 C)-98.6 F (37 C)] 98.6 F (37 C) (08/10 0431) Pulse Rate:  [57-69] 60 (08/10 1031) Resp:  [16-18] 18 (08/10 0431) BP: (124-187)/(51-75) 124/51 (08/10 1031) SpO2:  [94 %-100 %] 100 % (08/10 1031)  Weight change:  Filed Weights   05/09/19 1857 05/11/19 1357  Weight: 68 kg 68 kg    Intake/Output: I/O last 3 completed shifts: In: 279.9 [I.V.:279.9] Out: 1700 [Urine:1700]   Intake/Output this shift:  No intake/output data recorded.  Physical Exam Constitutional:      Appearance: Normal appearance.  HENT:     Head: Normocephalic.     Nose: Nose normal.     Mouth/Throat:     Mouth: Mucous membranes are moist.  Eyes:     Conjunctiva/sclera: Conjunctivae normal.     Comments: Left prosthetic eye  Neck:     Musculoskeletal: Neck supple.  Cardiovascular:     Rate and Rhythm: Normal rate and regular rhythm.     Heart sounds: Normal heart sounds.  Pulmonary:     Effort: Pulmonary effort is normal.     Breath sounds: Normal breath sounds.  Abdominal:     General: Abdomen is flat. Bowel sounds are normal.     Palpations: Abdomen is soft.  Genitourinary:    Comments: Condom cathter in place Musculoskeletal:        General: Swelling present. No tenderness.     Comments: Trace edema  Neurological:     Mental Status: He is alert and oriented to person, place, and time. Mental status is at baseline.  Psychiatric:        Mood and Affect: Mood normal.      Basic Metabolic Panel: Recent Labs  Lab 05/12/19 0646 05/13/19 0529 05/14/19 1454 05/15/19 0528 05/16/19 0459  NA 143 140 139 143 141  K 5.0 5.3* 4.7 4.9 5.4*  CL 116* 114* 115* 119* 117*  CO2 21* 21* 20* 20* 21*  GLUCOSE 94 154* 134* 53* 95  BUN 56* 50* 47* 48* 44*  CREATININE 3.56* 3.21* 3.12*  3.20* 3.05*  CALCIUM 7.8* 7.7* 7.5* 7.7* 7.7*    Liver Function Tests: No results for input(s): AST, ALT, ALKPHOS, BILITOT, PROT, ALBUMIN in the last 168 hours. No results for input(s): LIPASE, AMYLASE in the last 168 hours. Recent Labs  Lab 05/10/19 1529  AMMONIA 19    CBC: Recent Labs  Lab 05/09/19 1907  05/10/19 1814 05/11/19 1154 05/12/19 0646 05/13/19 0635 05/14/19 1454  WBC 4.7   < > 8.2 7.8 6.2 5.6 7.4  NEUTROABS 2.9  --   --   --   --   --   --   HGB 7.6*   < > 8.8* 8.8* 8.8* 8.2* 8.2*  HCT 24.1*   < > 27.8* 27.7* 27.4* 25.8* 25.7*  MCV 91.6   < > 93.0 91.1 90.7 91.5 91.5  PLT 141*   < > 136* 157 136* 120* 121*   < > = values in this interval not displayed.    Cardiac Enzymes: No results for input(s): CKTOTAL, CKMB, CKMBINDEX, TROPONINI in the last 168 hours.  BNP: Invalid input(s): POCBNP  CBG: Recent Labs  Lab 05/15/19 0828 05/15/19 1152 05/15/19 1646 05/15/19 2235 05/16/19 0737  GLUCAP 74 72 73 111* 102*  Microbiology: Results for orders placed or performed during the hospital encounter of 05/09/19  SARS Coronavirus 2 [facility] order, Performed in [place] hospital lab)     Status: None   Collection Time: 05/09/19  9:28 PM  Result Value Ref Range Status   SARS Coronavirus 2 NEGATIVE NEGATIVE Final    Comment: (NOTE) If result is NEGATIVE SARS-CoV-2 target nucleic acids are NOT DETECTED. The SARS-CoV-2 RNA is generally detectable in upper and lower  respiratory specimens during the acute phase of infection. The lowest  concentration of SARS-CoV-2 viral copies this assay can detect is 250  copies / mL. A negative result does not preclude SARS-CoV-2 infection  and should not be used as the sole basis for treatment or other  patient management decisions.  A negative result may occur with  improper specimen collection / handling, submission of specimen other  than nasopharyngeal swab, presence of viral mutation(s) within the  areas targeted  by this assay, and inadequate number of viral copies  (<250 copies / mL). A negative result must be combined with clinical  observations, patient history, and epidemiological information. If result is POSITIVE SARS-CoV-2 target nucleic acids are DETECTED. The SARS-CoV-2 RNA is generally detectable in upper and lower  respiratory specimens dur ing the acute phase of infection.  Positive  results are indicative of active infection with SARS-CoV-2.  Clinical  correlation with patient history and other diagnostic information is  necessary to determine patient infection status.  Positive results do  not rule out bacterial infection or co-infection with other viruses. If result is PRESUMPTIVE POSTIVE SARS-CoV-2 nucleic acids MAY BE PRESENT.   A presumptive positive result was obtained on the submitted specimen  and confirmed on repeat testing.  While 2019 novel coronavirus  (SARS-CoV-2) nucleic acids may be present in the submitted sample  additional confirmatory testing may be necessary for epidemiological  and / or clinical management purposes  to differentiate between  SARS-CoV-2 and other Sarbecovirus currently known to infect humans.  If clinically indicated additional testing with an alternate test  methodology [phone]) is advised. The SARS-CoV-2 RNA is generally  detectable in upper and lower respiratory sp ecimens during the acute  phase of infection. The expected result is Negative. Fact Sheet for Patients:  StrictlyIdeas.no Fact Sheet for Healthcare Providers: BankingDealers.co.za This test is not yet approved or cleared by the [place] FDA and has been authorized for detection and/or diagnosis of SARS-CoV-2 by FDA under an Emergency Use Authorization (EUA).  This EUA will remain in effect (meaning this test can be used) for the duration of the COVID-19 declaration under Section 564(b)(1) of the Act, 21 U.S.C. section  360bbb-3(b)(1), unless the authorization is terminated or revoked sooner. Performed at [facility], [address] [place], [place] [address]   C difficile quick scan w PCR reflex     Status: Abnormal   Collection Time: 05/14/19  4:51 AM   Specimen: Stool  Result Value Ref Range Status   C Diff antigen POSITIVE (A) NEGATIVE Final   C Diff toxin NEGATIVE NEGATIVE Final   C Diff interpretation Results are indeterminate. See PCR results.  Final    Comment: Performed at [facility], [address] [place], [place] [address]  C. Diff by PCR, Reflexed     Status: Abnormal   Collection Time: 05/14/19  4:51 AM  Result Value Ref Range Status   Toxigenic C. Difficile by PCR POSITIVE (A) NEGATIVE Final    Comment: Positive for toxigenic  C. difficile with little to no toxin production. Only treat if clinical presentation suggests symptomatic illness. Performed at [facility], [address] [place], [place] [address]     Coagulation Studies: No results for input(s): LABPROT, INR in the last 72 hours.  Urinalysis: No results for input(s): COLORURINE, LABSPEC, PHURINE, GLUCOSEU, HGBUR, BILIRUBINUR, KETONESUR, PROTEINUR, UROBILINOGEN, NITRITE, LEUKOCYTESUR in the last 72 hours.  Invalid input(s): APPERANCEUR    Imaging: No results found.   Medications:   . dextrose 5 % and 0.9% NaCl 75 mL/hr at 05/15/19 1243   . amLODipine  10 mg Oral Daily  . aspirin EC  81 mg Oral Daily  . atorvastatin  40 mg Oral QHS  . citalopram  20 mg Oral Daily  . clopidogrel  75 mg Oral Daily  . divalproex  500 mg Oral BID  . feeding supplement (GLUCERNA SHAKE)  237 mL Oral BID BM  . levothyroxine  100 mcg Oral Q0600   And  . levothyroxine  25 mcg Oral Q0600  . lipase/protease/amylase  12,000 Units Oral TID WC  . metoprolol tartrate  25 mg Oral BID  . multivitamin  1 tablet Oral QHS  . pantoprazole (PROTONIX) IV  40 mg Intravenous Q12H  . pneumococcal 23 valent vaccine   0.5 mL Intramuscular Tomorrow-1000  . sodium bicarbonate  650 mg Oral BID  . sodium zirconium cyclosilicate  10 g Oral Daily  . tamsulosin  0.4 mg Oral Daily  . vancomycin  125 mg Oral QID   acetaminophen **OR** acetaminophen, hydrALAZINE, ondansetron **OR** ondansetron (ZOFRAN) IV, polyethylene glycol, promethazine  Assessment/ Plan:  [name] is a 68 y.o. black male withLeft eye prosthesis, diabetes mellitus type 2, hypertension, syncope, and chronic kidney disease stage IV followed by [facility], who was admitted to [facility] on 05/09/2019 for fall and possible seizure.   1. Acute renal failure on Chronic kidney disease stage IV with hyperkalemia and metabolic acidosis  Follows with [facility]. Baseline Cr 3.0/GFR 25 from 04/06/2019 History of acute renal failure requiring hemodialylsis for several months. Has been off dialysis since 10/18/18.  Chronic kidney disease secondary to diabetic nephropathy and hypertension Acute renal failure secondary to prerenal azotemia and acute blood loss.  -Renal function appears to be stable at the moment.  Creatinine 3.05 with an EGFR 0.3.  No indication for dialysis at the moment.   2. Hyperkalemia  Potassium currently 5.4.  Maintain the patient on Lokelma 10 g daily for now.  3. Anemia with chronic kidney disease/renal failure: with acute blood loss anemia. Status post PRBC transfusion on 8/3.  Consider Epogen as an outpatient. Lab Results  Component Value Date   HGB 8.2 (L) 05/14/2019      LOS: 7 Latasia Silberstein 8/10/202011:35 AM

## 2019-05-16 NOTE — Progress Notes (Signed)
[facility] at [place] NAME: Cameron Adams    MR#:  [id number]  DATE OF BIRTH:  19-Sep-1951  SUBJECTIVE:  CHIEF COMPLAINT:   Chief Complaint  Patient presents with  . Fall  Patient is awake and sitting on the chair Still has watery stools Blood sugars have been better  No vomitings Mild abdominal discomfort No fever REVIEW OF SYSTEMS:   Review of Systems  Constitutional: Has generalized weakness HENT: Negative.   Eyes: Negative.   Respiratory: Negative.   Cardiovascular: Negative.   Gastrointestinal: Has diarrhea Genitourinary: Negative.   Musculoskeletal: Negative.   Skin: Negative.   Neurological: Negative.   Endo/Heme/Allergies: Negative.   Psychiatric/Behavioral: Negative.   All other systems reviewed and are negative. DRUG ALLERGIES:   Allergies  Allergen Reactions  . Gabapentin     Other reaction(s): Other (See Comments) Trouble sleeping    VITALS:  Blood pressure (!) 124/51, pulse 60, temperature 98.6 F (37 C), temperature source Oral, resp. rate 18, height 5\' 11"  (1.803 m), weight 68 kg, SpO2 100 %. PHYSICAL EXAMINATION:  GENERAL: Pleasant-appearing in no apparent distress.  HEAD, EYES, EARS, NOSE AND THROAT: Atraumatic, normocephalic. Extraocular muscles are intact. Pupils equal and reactive to light. Sclerae anicteric. No conjunctival injection. No oro-pharyngeal erythema.  NECK: Supple. There is no jugular venous distention. No bruits, no lymphadenopathy, no thyromegaly.  HEART: Regular rate and rhythm, tachycardic. No murmurs, no rubs, no clicks.  LUNGS: Clear to auscultation bilaterally. No rales or rhonchi. No wheezes.  ABDOMEN: Soft, flat, tenderness noted around the umbilicus, nondistended. Has good bowel sounds. No hepatosplenomegaly appreciated.  EXTREMITIES: No evidence of any cyanosis, clubbing, or peripheral edema.  +2 pedal and radial pulses bilaterally.  NEUROLOGIC: The patient is alert,  awake, and oriented x3 with no focal motor or sensory deficits appreciated bilaterally.  SKIN: Moist and warm with no rashes appreciated.  LYMPHATIC: No cervical or axillary lymphadenopathy.    LABORATORY PANEL:  Male CBC Recent Labs  Lab 05/14/19 1454  WBC 7.4  HGB 8.2*  HCT 25.7*  PLT 121*   ------------------------------------------------------------------------------------------------------------------ Chemistries  Recent Labs  Lab 05/16/19 0459  NA 141  K 5.4*  CL 117*  CO2 21*  GLUCOSE 95  BUN 44*  CREATININE 3.05*  CALCIUM 7.7*   RADIOLOGY:  No results found. ASSESSMENT AND PLAN:  68 y.o.malewith a known history of diabetes mellitus, CKD, diastolic CHF, history of seizure, hypothyroidism, and history of urinary retention admitted for AMS. AMS most likely multifactoriel includingi metabolic/electrolytes abnties, infections. Pateitn has a hs/o of sz on depakote.  1.  Acute blood loss anemia -Staus post EGD showing previous bleeding evidence from Mallory-Weiss tear.  Nothing acute -  Status post 1 unit packed red blood cells on this admission -H&H remained stable. Last Hemoglobin 8.2 Monitor hemoglobin hematocrit as needed  2.  Acute C. difficile colitis Improving slowly Patient started on oral vancomycin and to continue Enteric precautions to continue  3.  Acute on chronic renal failure ckd stage IV: Creatinine 3.05-slowly improving -  nephrology following, renal function improving -IV fluids to continue -Avoid nephrotoxic meds -Off dialysis since 10/18/18 -No need for dialysis as of now  4.  Nausea/vomiting -Resolved  5.  Diabetes mellitus - Moderate sliding scale insulin  6.  Acute metabolic encephalopathy Improved -Seem to be back to his baseline mental status - Continue depakote 500 mg bid.  Normal ammonia level  7.  Hyperkalemia -Patient on lokelma  8.  Hypoglycemia IV D5 normal saline infusion Monitor blood sugars closely Hold  hypoglycemic drugs Hold insulin sliding scale coverage  therapist recommends rehab  All the records are reviewed and case discussed with Care Management/Social Worker. Management plans discussed with the patient, nursing and they are in agreement.  CODE STATUS: Full Code  TOTAL TIME TAKING CARE OF THIS PATIENT: 35 minutes.   More than 50% of the time was spent in counseling/coordination of care: YES  POSSIBLE D/C IN 1-2 DAYS, DEPENDING ON CLINICAL CONDITION.   Saundra Shelling M.D on 05/16/2019 at 12:43 PM  Between 7am to 6pm - Pager - [phone]  After 6pm go to [email or web address] - Proofreader  [facility]  Office  [phone]  CC: Primary care physician; [name], MD  Note: This dictation was prepared with Dragon dictation along with smaller phrase technology. Any transcriptional errors that result from this process are unintentional.

## 2019-05-17 LAB — GLUCOSE, CAPILLARY
Glucose-Capillary: 176 mg/dL — ABNORMAL HIGH (ref 70–99)
Glucose-Capillary: 165 mg/dL — ABNORMAL HIGH (ref 70–99)
Glucose-Capillary: 167 mg/dL — ABNORMAL HIGH (ref 70–99)
Glucose-Capillary: 111 mg/dL — ABNORMAL HIGH (ref 70–99)

## 2019-05-17 LAB — BASIC METABOLIC PANEL
Anion gap: 5 (ref 5–15)
BUN: 42 mg/dL — ABNORMAL HIGH (ref 8–23)
CO2: 19 mmol/L — ABNORMAL LOW (ref 22–32)
Calcium: 7.6 mg/dL — ABNORMAL LOW (ref 8.9–10.3)
Chloride: 115 mmol/L — ABNORMAL HIGH (ref 98–111)
Creatinine, Ser: 3.11 mg/dL — ABNORMAL HIGH (ref 0.61–1.24)
GFR calc Af Amer: 23 mL/min — ABNORMAL LOW (ref >60)
GFR calc non Af Amer: 20 mL/min — ABNORMAL LOW (ref >60)
Glucose, Bld: 193 mg/dL — ABNORMAL HIGH (ref 70–99)
Potassium: 5.3 mmol/L — ABNORMAL HIGH (ref 3.5–5.1)
Sodium: 139 mmol/L (ref 135–145)

## 2019-05-17 MED ORDER — FIDAXOMICIN 200 MG PO TABS
200.0000 mg | ORAL_TABLET | Freq: Two times a day (BID) | ORAL | Status: DC
  Filled 2019-05-17 (×2): qty 1

## 2019-05-17 MED ORDER — VANCOMYCIN 50 MG/ML ORAL SOLUTION
500.0000 mg | Freq: Four times a day (QID) | ORAL | Status: DC
  Administered 2019-05-17 – 2019-05-21 (×17): 500 mg via ORAL
  Filled 2019-05-17 (×25): qty 10

## 2019-05-17 MED ORDER — INSULIN ASPART 100 UNIT/ML ~~LOC~~ SOLN
0.0000 [IU] | Freq: Three times a day (TID) | SUBCUTANEOUS | Status: DC
  Administered 2019-05-17: 17:00:00 3 [IU] via SUBCUTANEOUS
  Administered 2019-05-18 – 2019-05-20 (×6): 2 [IU] via SUBCUTANEOUS
  Filled 2019-05-17 (×7): qty 1

## 2019-05-17 MED ORDER — INSULIN ASPART 100 UNIT/ML ~~LOC~~ SOLN: 0.0000 [IU] | Freq: Every day | SUBCUTANEOUS | Status: DC

## 2019-05-17 MED ORDER — SODIUM CHLORIDE 0.9 % IV SOLN
INTRAVENOUS | Status: DC
  Administered 2019-05-17 – 2019-05-19 (×6): via INTRAVENOUS

## 2019-05-17 NOTE — Progress Notes (Signed)
[place] at [place] NAME: Atanacio Melnyk    MR#:  [id number]  DATE OF BIRTH:  1950-10-22  SUBJECTIVE:  CHIEF COMPLAINT:   Chief Complaint  Patient presents with  . Fall  Patient is awake and lying on the bed Still has diarrhea Blood sugars have been better  No vomitings Mild abdominal discomfort No fever REVIEW OF SYSTEMS:   Review of Systems  Constitutional: Has generalized weakness HENT: Negative.   Eyes: Negative.   Respiratory: Negative.   Cardiovascular: Negative.   Gastrointestinal: Has diarrhea Genitourinary: Negative.   Musculoskeletal: Negative.   Skin: Negative.   Neurological: Negative.   Endo/Heme/Allergies: Negative.   Psychiatric/Behavioral: Negative.   All other systems reviewed and are negative. DRUG ALLERGIES:   Allergies  Allergen Reactions  . Gabapentin     Other reaction(s): Other (See Comments) Trouble sleeping    VITALS:  Blood pressure (!) 186/66, pulse (!) 59, temperature 98.4 F (36.9 C), temperature source Oral, resp. rate 20, height 5\' 11"  (1.803 m), weight 68 kg, SpO2 98 %. PHYSICAL EXAMINATION:  GENERAL: Pleasant-appearing in no apparent distress.  HEAD, EYES, EARS, NOSE AND THROAT: Atraumatic, normocephalic. Extraocular muscles are intact. Pupils equal and reactive to light. Sclerae anicteric. No conjunctival injection. No oro-pharyngeal erythema.  NECK: Supple. There is no jugular venous distention. No bruits, no lymphadenopathy, no thyromegaly.  HEART: Regular rate and rhythm, tachycardic. No murmurs, no rubs, no clicks.  LUNGS: Clear to auscultation bilaterally. No rales or rhonchi. No wheezes.  ABDOMEN: Soft, flat, tenderness noted around the umbilicus, nondistended. Has good bowel sounds. No hepatosplenomegaly appreciated.  EXTREMITIES: No evidence of any cyanosis, clubbing, or peripheral edema.  +2 pedal and radial pulses bilaterally.  NEUROLOGIC: The patient is alert, awake,  and oriented x3 with no focal motor or sensory deficits appreciated bilaterally.  SKIN: Moist and warm with no rashes appreciated.  LYMPHATIC: No cervical or axillary lymphadenopathy.    LABORATORY PANEL:  Male CBC Recent Labs  Lab 05/14/19 1454  WBC 7.4  HGB 8.2*  HCT 25.7*  PLT 121*   ------------------------------------------------------------------------------------------------------------------ Chemistries  Recent Labs  Lab 05/16/19 0459  NA 141  K 5.4*  CL 117*  CO2 21*  GLUCOSE 95  BUN 44*  CREATININE 3.05*  CALCIUM 7.7*   RADIOLOGY:  No results found. ASSESSMENT AND PLAN:  68 y.o.malewith a known history of diabetes mellitus, CKD, diastolic CHF, history of seizure, hypothyroidism, and history of urinary retention admitted for AMS. AMS most likely multifactoriel includingi metabolic/electrolytes abnties, infections. Pateitn has a hs/o of sz on depakote.  1.  Acute blood loss anemia -Staus post EGD showing previous bleeding evidence from Mallory-Weiss tear.  Nothing acute -  Status post 1 unit packed red blood cells on this admission -H&H remained stable. Last Hemoglobin 8.2 Monitor hemoglobin hematocrit as needed  2.  Acute C. difficile colitis Worsening diarrhea Continue oral vancomycin Patient will be started on Dificid from today Enteric precautions to continue  3.  Acute on chronic renal failure ckd stage IV: Creatinine 3.05-slowly improving -  nephrology following, renal function improving -Pending a.m. labs -IV fluids to continue and switch to normal saline -Avoid nephrotoxic meds -Off dialysis since 10/18/18 -No need for dialysis as of now  4.  Nausea/vomiting -Resolved  5.  Diabetes mellitus - Moderate sliding scale insulin -Diabetic diet  6.  Acute metabolic encephalopathy Improved -Seem to be back to his baseline mental status -  Continue depakote 500 mg bid.  Normal ammonia level  7.  Hyperkalemia -Patient on lokelma  8.   Hypoglycemia Resolved  9.  Ambulatory dysfunction -Status post physical therapy evaluation -SNF placement once clinically active issues are regular   All the records are reviewed and case discussed with Care Management/Social Worker. Management plans discussed with the patient, nursing and they are in agreement.  CODE STATUS: Full Code  TOTAL TIME TAKING CARE OF THIS PATIENT: 36 minutes.   More than 50% of the time was spent in counseling/coordination of care: YES  POSSIBLE D/C IN 1-2 DAYS, DEPENDING ON CLINICAL CONDITION.   Saundra Shelling M.D on 05/17/2019 at 10:46 AM  Between 7am to 6pm - Pager - [phone]  After 6pm go to [email or web address] - Proofreader  [facility] [place] [facility]  Office  [phone]  CC: Primary care physician; [name], MD  Note: This dictation was prepared with Dragon dictation along with smaller phrase technology. Any transcriptional errors that result from this process are unintentional.

## 2019-05-17 NOTE — Plan of Care (Signed)
Patient has had fewer BM's today but has had a few large ones. His Vancomycin PO was increased. Pt refused bed alarm, but is more oriented and has not tried to get out the bed alone.

## 2019-05-17 NOTE — Progress Notes (Signed)
PT Cancellation Note  Patient Details Name: Cameron Adams MRN: [id number] DOB: January 09, 1951   Cancelled Treatment:    Reason Eval/Treat Not Completed: Medical issues which prohibited therapy(Awaiting blood work this AM to reassess electrolytes (specifically potassium).  Will continue to follow and treat as appropriate.)   Lashundra Shiveley H. Owens Shark, PT, DPT, NCS 05/17/19, 11:07 AM [phone]

## 2019-05-17 NOTE — Progress Notes (Signed)
[facility]  ROUNDING NOTE   Subjective:  Patient having ongoing diarrhea at the moment. Creatinine a little worse at 3.1 with a BUN of 42. Potassium currently 5.3.    Objective:  Vital signs in last 24 hours:  Temp:  [98.3 F (36.8 C)-98.6 F (37 C)] 98.3 F (36.8 C) (08/11 1245) Pulse Rate:  [56-64] 56 (08/11 1245) Resp:  [18-20] 18 (08/11 1245) BP: (144-186)/(65-68) 144/65 (08/11 1245) SpO2:  [95 %-98 %] 95 % (08/11 1245)  Weight change:  Filed Weights   05/09/19 1857 05/11/19 1357  Weight: 68 kg 68 kg    Intake/Output: I/O last 3 completed shifts: In: 1720.1 [I.V.:1720.1] Out: 800 [Urine:800]   Intake/Output this shift:  Total I/O In: -  Out: 500 [Urine:500]  Physical Exam Constitutional:      Appearance: Normal appearance.  HENT:     Head: Normocephalic.     Nose: Nose normal.     Mouth/Throat:     Mouth: Mucous membranes are moist.  Eyes:     Conjunctiva/sclera: Conjunctivae normal.     Comments: Left prosthetic eye  Neck:     Musculoskeletal: Neck supple.  Cardiovascular:     Rate and Rhythm: Normal rate and regular rhythm.     Heart sounds: Normal heart sounds.  Pulmonary:     Effort: Pulmonary effort is normal.     Breath sounds: Normal breath sounds.  Abdominal:     General: Abdomen is flat. Bowel sounds are normal.     Palpations: Abdomen is soft.  Genitourinary:    Comments: Condom cathter in place Musculoskeletal:        General: Swelling present. No tenderness.     Comments: Trace edema  Neurological:     Mental Status: He is alert and oriented to person, place, and time. Mental status is at baseline.  Psychiatric:        Mood and Affect: Mood normal.      Basic Metabolic Panel: Recent Labs  Lab 05/13/19 0529 05/14/19 1454 05/15/19 0528 05/16/19 0459 05/17/19 1038  NA 140 139 143 141 139  K 5.3* 4.7 4.9 5.4* 5.3*  CL 114* 115* 119* 117* 115*  CO2 21* 20* 20* 21* 19*  GLUCOSE 154* 134* 53* 95 193*  BUN 50*  47* 48* 44* 42*  CREATININE 3.21* 3.12* 3.20* 3.05* 3.11*  CALCIUM 7.7* 7.5* 7.7* 7.7* 7.6*    Liver Function Tests: No results for input(s): AST, ALT, ALKPHOS, BILITOT, PROT, ALBUMIN in the last 168 hours. No results for input(s): LIPASE, AMYLASE in the last 168 hours. Recent Labs  Lab 05/10/19 1529  AMMONIA 19    CBC: Recent Labs  Lab 05/10/19 1814 05/11/19 1154 05/12/19 0646 05/13/19 0635 05/14/19 1454  WBC 8.2 7.8 6.2 5.6 7.4  HGB 8.8* 8.8* 8.8* 8.2* 8.2*  HCT 27.8* 27.7* 27.4* 25.8* 25.7*  MCV 93.0 91.1 90.7 91.5 91.5  PLT 136* 157 136* 120* 121*    Cardiac Enzymes: No results for input(s): CKTOTAL, CKMB, CKMBINDEX, TROPONINI in the last 168 hours.  BNP: Invalid input(s): POCBNP  CBG: Recent Labs  Lab 05/16/19 1248 05/16/19 1720 05/16/19 2108 05/17/19 0724 05/17/19 1140  GLUCAP 209* 150* 184* 176* 165*    Microbiology: Results for orders placed or performed during the hospital encounter of 05/09/19  SARS Coronavirus 2 [facility] order, Performed in [facility] hospital lab)     Status: None   Collection Time: 05/09/19  9:28 PM  Result Value Ref Range Status  SARS Coronavirus 2 NEGATIVE NEGATIVE Final    Comment: (NOTE) If result is NEGATIVE SARS-CoV-2 target nucleic acids are NOT DETECTED. The SARS-CoV-2 RNA is generally detectable in upper and lower  respiratory specimens during the acute phase of infection. The lowest  concentration of SARS-CoV-2 viral copies this assay can detect is 250  copies / mL. A negative result does not preclude SARS-CoV-2 infection  and should not be used as the sole basis for treatment or other  patient management decisions.  A negative result may occur with  improper specimen collection / handling, submission of specimen other  than nasopharyngeal swab, presence of viral mutation(s) within the  areas targeted by this assay, and inadequate number of viral copies  (<250 copies / mL). A negative result must be combined  with clinical  observations, patient history, and epidemiological information. If result is POSITIVE SARS-CoV-2 target nucleic acids are DETECTED. The SARS-CoV-2 RNA is generally detectable in upper and lower  respiratory specimens dur ing the acute phase of infection.  Positive  results are indicative of active infection with SARS-CoV-2.  Clinical  correlation with patient history and other diagnostic information is  necessary to determine patient infection status.  Positive results do  not rule out bacterial infection or co-infection with other viruses. If result is PRESUMPTIVE POSTIVE SARS-CoV-2 nucleic acids MAY BE PRESENT.   A presumptive positive result was obtained on the submitted specimen  and confirmed on repeat testing.  While 2019 novel coronavirus  (SARS-CoV-2) nucleic acids may be present in the submitted sample  additional confirmatory testing may be necessary for epidemiological  and / or clinical management purposes  to differentiate between  SARS-CoV-2 and other Sarbecovirus currently known to infect humans.  If clinically indicated additional testing with an alternate test  methodology [phone]) is advised. The SARS-CoV-2 RNA is generally  detectable in upper and lower respiratory sp ecimens during the acute  phase of infection. The expected result is Negative. Fact Sheet for Patients:  StrictlyIdeas.no Fact Sheet for Healthcare Providers: BankingDealers.co.za This test is not yet approved or cleared by the [place] FDA and has been authorized for detection and/or diagnosis of SARS-CoV-2 by FDA under an Emergency Use Authorization (EUA).  This EUA will remain in effect (meaning this test can be used) for the duration of the COVID-19 declaration under Section 564(b)(1) of the Act, 21 U.S.C. section 360bbb-3(b)(1), unless the authorization is terminated or revoked sooner. Performed at [facility], [address] [place], [place] [address]   C difficile quick scan w PCR reflex     Status: Abnormal   Collection Time: 05/14/19  4:51 AM   Specimen: Stool  Result Value Ref Range Status   C Diff antigen POSITIVE (A) NEGATIVE Final   C Diff toxin NEGATIVE NEGATIVE Final   C Diff interpretation Results are indeterminate. See PCR results.  Final    Comment: Performed at [facility], [address] [place], [place] [address]  C. Diff by PCR, Reflexed     Status: Abnormal   Collection Time: 05/14/19  4:51 AM  Result Value Ref Range Status   Toxigenic C. Difficile by PCR POSITIVE (A) NEGATIVE Final    Comment: Positive for toxigenic C. difficile with little to no toxin production. Only treat if clinical presentation suggests symptomatic illness. Performed at [facility], [address] [place], [place] [address]     Coagulation Studies: No results for input(s): LABPROT, INR in the last 72 hours.  Urinalysis:  No results for input(s): COLORURINE, LABSPEC, [place], GLUCOSEU, HGBUR, BILIRUBINUR, KETONESUR, PROTEINUR, UROBILINOGEN, NITRITE, LEUKOCYTESUR in the last 72 hours.  Invalid input(s): APPERANCEUR    Imaging: No results found.   Medications:   . sodium chloride 75 mL/hr at 05/17/19 0910   . amLODipine  10 mg Oral Daily  . aspirin EC  81 mg Oral Daily  . atorvastatin  40 mg Oral QHS  . citalopram  20 mg Oral Daily  . clopidogrel  75 mg Oral Daily  . divalproex  500 mg Oral BID  . feeding supplement (GLUCERNA SHAKE)  237 mL Oral BID BM  . insulin aspart  0-15 Units Subcutaneous TID WC  . insulin aspart  0-5 Units Subcutaneous QHS  . levothyroxine  100 mcg Oral Q0600   And  . levothyroxine  25 mcg Oral Q0600  . lipase/protease/amylase  12,000 Units Oral TID WC  . metoprolol tartrate  25 mg Oral BID  . multivitamin  1 tablet Oral QHS  . pantoprazole (PROTONIX) IV  40 mg Intravenous Q12H  . pneumococcal 23 valent vaccine  0.5 mL Intramuscular  Tomorrow-1000  . sodium bicarbonate  650 mg Oral BID  . sodium zirconium cyclosilicate  10 g Oral Daily  . tamsulosin  0.4 mg Oral Daily  . vancomycin  500 mg Oral QID   acetaminophen **OR** acetaminophen, hydrALAZINE, ondansetron **OR** ondansetron (ZOFRAN) IV, polyethylene glycol, promethazine  Assessment/ Plan:  [name] is a 68 y.o. black male withLeft eye prosthesis, diabetes mellitus type 2, hypertension, syncope, and chronic kidney disease stage IV followed by [facility], who was admitted to [facility] on 05/09/2019 for fall and possible seizure.   1. Acute renal failure on Chronic kidney disease stage IV with hyperkalemia and metabolic acidosis  Follows with [facility] Nephrology. Baseline Cr 3.0/GFR 25 from 04/06/2019 History of acute renal failure requiring hemodialylsis for several months. Has been off dialysis since 10/18/18.  Chronic kidney disease secondary to diabetic nephropathy and hypertension Acute renal failure secondary to prerenal azotemia and acute blood loss.  -Creatinine currently 3.11 with a BUN of 42.  Patient is making urine.  No urgent indication for dialysis.  Continue to monitor renal parameters daily.   2. Hyperkalemia  Potassium slightly lower today at 5.3.  Maintain the patient on Lokelma 10 g daily and follow-up serum potassium tomorrow.  3. Anemia with chronic kidney disease/renal failure: with acute blood loss anemia. Status post PRBC transfusion on 8/3.  Consider Epogen as an outpatient. Lab Results  Component Value Date   HGB 8.2 (L) 05/14/2019      LOS: 8 Xiara Knisley 8/11/20201:47 PM

## 2019-05-17 NOTE — Progress Notes (Signed)
Inpatient Diabetes Program Recommendations  AACE/ADA: New Consensus Statement on Inpatient Glycemic Control  Target Ranges:  Prepandial:   less than 140 mg/dL      [facility] postprandial:   less than 180 mg/dL (1-2 hours)      Critically ill patients:  140 - 180 mg/dL   Results for Cameron Adams, Cameron Adams (MRN [id number]) as of 05/17/2019 11:52  Ref. Range 05/16/2019 07:37 05/16/2019 12:48 05/16/2019 17:20 05/16/2019 21:08 05/17/2019 07:24  Glucose-Capillary Latest Ref Range: 70 - 99 mg/dL 102 (H) 209 (H) 150 (H) 184 (H) 176 (H)  Results for Cameron Adams, Cameron Adams (MRN [id number]) as of 05/17/2019 11:52  Ref. Range 12/05/2018 04:53  Hemoglobin A1C Latest Ref Range: 4.8 - 5.6 % 7.2 (H)   Review of Glycemic Control  Diabetes history: DM2 Outpatient Diabetes medications: Glipizide 2.5 mg BID Current orders for Inpatient glycemic control: CBGs AC&HS  Inpatient Diabetes Program Recommendations:   Insulin-Correction: Please consider ordering Novolog 0-9 units TID with meals and Novolog 0-5 units QHS.  Diet: If appropriate, please consider changing diet from Regular to Carb Modified.  Thanks, Barnie Alderman, RN, MSN, CDE Diabetes Coordinator Inpatient Diabetes Program [phone] (Team Pager from 8am to 5pm)

## 2019-05-17 NOTE — TOC Progression Note (Addendum)
Transition of Care [facility]) - Progression Note    Patient Details  Name: ELSTON ALDAPE MRN: [id number] Date of Birth: [birth year]  Transition of Care [facility]) CM/SW Contact  Katrina Stack, RN Phone Number: 05/17/2019, 1:48 PM  Clinical Narrative:   Physical therapy had to hold treatment today due to elevated potassium. Last therapy treatment 05/13/19.  Being treated with oral vancomycin for his C Diff. Continues with watery diarrhea but frequency is decreasing. Continue to anticipate return to [facility] when medically stable.  Sent updated FL2 today to inform of C Diff    Expected Discharge Plan: [place] Barriers to Discharge: Continued Medical Work up  Expected Discharge Plan and Services Expected Discharge Plan: [place] Choice: [place] arrangements for the past 2 months: [place], [place] Expected Discharge Date: 05/13/19                                     Social Determinants of Health (SDOH) Interventions    Readmission Risk Interventions Readmission Risk Prevention Plan 04/26/2019 01/16/2019  Transportation Screening Complete Complete  PCP or Specialist Appt within 3-5 Days - Complete  HRI or [place] - Complete  Social Work Consult for [place] Planning/Counseling - Complete  Palliative Care Screening - Not Applicable  Medication Review Press photographer) Complete Complete  HRI or Home Care Consult Complete -  SW Recovery Care/Counseling Consult Complete -  Palliative Care Screening Not Applicable -  [place] Patient Refused -  Some recent data might be hidden

## 2019-05-17 NOTE — NC FL2 (Signed)
[place] LEVEL OF CARE SCREENING TOOL     IDENTIFICATION  Patient Name: Cameron Adams Birthdate: 09/21/1951 Sex: male Admission Date (Current Location): 05/09/2019  [place] and [place] Number:  Engineering geologist and Address:  [facility], [address] [place], [place] [address]      Provider Number: [id number]  Attending Physician Name and Address:  Saundra Shelling, MD  Relative Name and Phone Number:       Current Level of Care: Hospital Recommended Level of Care: [place] Prior Approval Number:    Date Approved/Denied:   PASRR Number: [id number]  Discharge Plan: SNF    Current Diagnoses: Patient Active Problem List   Diagnosis Date Noted  . Dehydration 05/09/2019  . Confusion   . Visual hallucination   . Altered mental status 04/24/2019  . Syncope and collapse 01/12/2019  . Pancreatic insufficiency 12/28/2018  . Chronic systolic CHF (congestive heart failure) ([place]) 12/13/2018  . History of TIA (transient ischemic attack) 12/13/2018  . Weakness   . AKI (acute kidney injury) ([place]) 12/04/2018  . Arm wound, right, initial encounter 07/21/2018  . Wound infection 07/21/2018  . Orthostatic hypotension dysautonomic syndrome ([place]) 07/07/2018  . Acute renal failure superimposed on stage 3 chronic kidney disease ([place]) 12/09/2017  . Dizziness 06/22/2017  . Hyperkalemia 06/16/2017  . Esophageal dysmotilities   . Dysphagia   . Rib fractures 05/17/2017  . Hemothorax, right 05/17/2017  . Recurrent major depressive disorder, in partial remission ([place]) 05/13/2017  . Pure hypercholesterolemia 05/13/2017  . IDDM (insulin dependent diabetes mellitus) ([place]) 05/13/2017  . Hypothyroidism (acquired) 05/13/2017  . GAD (generalized anxiety disorder) 05/13/2017  . CKD (chronic kidney disease) stage 3, GFR 30-59 ml/min (HCC) 05/13/2017  . Toe gangrene ([place]) 09/16/2016  . PVD (peripheral vascular disease) ([place]) 09/16/2016   . Uncontrolled diabetes mellitus ([place]) 09/16/2016  . Hyponatremia 09/16/2016  . Hypokalemia 09/16/2016  . Essential hypertension, malignant 09/16/2016  . Abnormal angiogram 09/16/2016  . Gangrene ([place]) 09/13/2016  . Pruritus 03/26/2016  . Orthostatic hypotension 03/26/2016  . 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 Alcohol abuse 03/26/2016  . Hyperglycemia 03/23/2016  . Passive suicidal ideations 09/16/2014  . MDD (major depressive disorder) 09/16/2014  . Dyspnea 11/09/2013  . CAP (community acquired pneumonia) 10/31/2013  . Chest pain 10/31/2013  . Chronic respiratory failure with hypoxia ([place]) 10/31/2013  . 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 Diabetic retinopathy ([place]) 06/16/2013  . Gynecomastia 04/18/2013    Orientation RESPIRATION BLADDER Height & Weight     Self  Normal Indwelling catheter Weight: 68 kg Height:  5\' 11"  (180.3 cm)  BEHAVIORAL SYMPTOMS/MOOD NEUROLOGICAL BOWEL NUTRITION STATUS  (has had some agitation) (None) Continent Diet  AMBULATORY STATUS COMMUNICATION OF NEEDS Skin   Extensive Assist Verbally Skin abrasions                       Personal Care Assistance Level of Assistance  Bathing, Dressing Bathing Assistance: Limited assistance Feeding assistance: Limited assistance Dressing Assistance: Limited assistance     Functional Limitations Info  (elye prosthesis) Sight Info: Adequate Hearing Info: Adequate Speech Info: Adequate    SPECIAL CARE FACTORS FREQUENCY  PT (By licensed PT)     PT Frequency: 5 x week              Contractures Contractures Info: Not present    Additional Factors Info  Code Status Code Status Info: full Allergies Info:  Gabapentin Psychotropic Info: Celexa 20 mg PO daily, Depakote ER 500 mg PO BID.         Current Medications (05/17/2019):  This is the current hospital active medication list Current Facility-Administered Medications  Medication Dose Route Frequency  Provider Last Rate Last Dose  . 0.9 %  sodium chloride infusion   Intravenous Continuous Pyreddy, Reatha Harps, MD 75 mL/hr at 05/17/19 0910    . acetaminophen (TYLENOL) tablet 650 mg  650 mg Oral Q6H PRN Seals, Theo Dills, NP       Or  . acetaminophen (TYLENOL) suppository 650 mg  650 mg Rectal Q6H PRN Seals, Theo Dills, NP      . amLODipine (NORVASC) tablet 10 mg  10 mg Oral Daily Seals, Angela H, NP   10 mg at 05/17/19 0900  . aspirin EC tablet 81 mg  81 mg Oral Daily Seals, Theo Dills, NP   81 mg at 05/17/19 0900  . atorvastatin (LIPITOR) tablet 40 mg  40 mg Oral QHS Seals, Angela H, NP   40 mg at 05/16/19 2123  . citalopram (CELEXA) tablet 20 mg  20 mg Oral Daily Seals, Angela H, NP   20 mg at 05/17/19 0900  . clopidogrel (PLAVIX) tablet 75 mg  75 mg Oral Daily Seals, Theo Dills, NP   75 mg at 05/17/19 0901  . divalproex (DEPAKOTE ER) 24 hr tablet 500 mg  500 mg Oral BID Gardiner Barefoot H, NP   500 mg at 05/17/19 0859  . feeding supplement (GLUCERNA SHAKE) (GLUCERNA SHAKE) liquid 237 mL  237 mL Oral BID BM Manuella Ghazi, Vipul, MD   237 mL at 05/17/19 1051  . hydrALAZINE (APRESOLINE) injection 10 mg  10 mg Intravenous Q6H PRN Mansy, Jan A, MD   10 mg at 05/13/19 0547  . insulin aspart (novoLOG) injection 0-15 Units  0-15 Units Subcutaneous TID WC Pyreddy, Pavan, MD      . insulin aspart (novoLOG) injection 0-5 Units  0-5 Units Subcutaneous QHS Pyreddy, Pavan, MD      . levothyroxine (SYNTHROID) tablet 100 mcg  100 mcg Oral Q0600 Max Sane, MD   100 mcg at 05/17/19 0524   And  . levothyroxine (SYNTHROID) tablet 25 mcg  25 mcg Oral Q0600 Max Sane, MD   25 mcg at 05/17/19 0525  . lipase/protease/amylase (CREON) capsule 12,000 Units  12,000 Units Oral TID WC Mayer Camel, NP   12,000 Units at 05/17/19 0859  . metoprolol tartrate (LOPRESSOR) tablet 25 mg  25 mg Oral BID Seals, Angela H, NP   25 mg at 05/17/19 0900  . multivitamin (RENA-VIT) tablet 1 tablet  1 tablet Oral QHS Max Sane, MD   1 tablet at 05/16/19  2122  . ondansetron (ZOFRAN) tablet 4 mg  4 mg Oral Q6H PRN Seals, Theo Dills, NP       Or  . ondansetron (ZOFRAN) injection 4 mg  4 mg Intravenous Q6H PRN Seals, Theo Dills, NP   4 mg at 05/10/19 1229  . pantoprazole (PROTONIX) injection 40 mg  40 mg Intravenous Q12H Seals, Angela H, NP   40 mg at 05/17/19 0900  . pneumococcal 23 valent vaccine (PNU-IMMUNE) injection 0.5 mL  0.5 mL Intramuscular Tomorrow-1000 Pyreddy, Pavan, MD      . polyethylene glycol (MIRALAX / GLYCOLAX) packet 17 g  17 g Oral Daily PRN Seals, Levada Dy H, NP      . promethazine (PHENERGAN) injection 12.5 mg  12.5 mg Intravenous Q6H PRN Max Sane, MD  12.5 mg at 05/10/19 1817  . sodium bicarbonate tablet 650 mg  650 mg Oral BID Gardiner Barefoot H, NP   650 mg at 05/17/19 0900  . sodium zirconium cyclosilicate (LOKELMA) packet 10 g  10 g Oral Daily Murlean Iba, MD   10 g at 05/17/19 0859  . tamsulosin (FLOMAX) capsule 0.4 mg  0.4 mg Oral Daily Seals, Angela H, NP   0.4 mg at 05/17/19 0900  . vancomycin (VANCOCIN) 50 mg/mL oral solution 500 mg  500 mg Oral QID Saundra Shelling, MD         Discharge Medications: Please see discharge summary for a list of discharge medications.  Relevant Imaging Results:  Relevant Lab Results:   Additional Information Now being treated with oral Vancomycin for C Diff.  Joni Reining RN  Katrina Stack, RN

## 2019-05-18 LAB — BASIC METABOLIC PANEL
Anion gap: 4 — ABNORMAL LOW (ref 5–15)
BUN: 45 mg/dL — ABNORMAL HIGH (ref 8–23)
CO2: 18 mmol/L — ABNORMAL LOW (ref 22–32)
Calcium: 7.5 mg/dL — ABNORMAL LOW (ref 8.9–10.3)
Chloride: 119 mmol/L — ABNORMAL HIGH (ref 98–111)
Creatinine, Ser: 3.31 mg/dL — ABNORMAL HIGH (ref 0.61–1.24)
GFR calc Af Amer: 21 mL/min — ABNORMAL LOW (ref >60)
GFR calc non Af Amer: 18 mL/min — ABNORMAL LOW (ref >60)
Glucose, Bld: 105 mg/dL — ABNORMAL HIGH (ref 70–99)
Potassium: 5 mmol/L (ref 3.5–5.1)
Sodium: 141 mmol/L (ref 135–145)

## 2019-05-18 LAB — GLUCOSE, CAPILLARY
Glucose-Capillary: 110 mg/dL — ABNORMAL HIGH (ref 70–99)
Glucose-Capillary: 148 mg/dL — ABNORMAL HIGH (ref 70–99)
Glucose-Capillary: 126 mg/dL — ABNORMAL HIGH (ref 70–99)
Glucose-Capillary: 95 mg/dL (ref 70–99)

## 2019-05-18 NOTE — Progress Notes (Signed)
[facility]  ROUNDING NOTE   Subjective:  Patient continues to have diarrhea. Creatinine currently 3.3.    Objective:  Vital signs in last 24 hours:  Temp:  [98.8 F (37.1 C)-99.9 F (37.7 C)] 98.8 F (37.1 C) (08/12 1135) Pulse Rate:  [63-69] 63 (08/12 1135) Resp:  [16-20] 16 (08/12 1135) BP: (117-162)/(59-70) 138/59 (08/12 1135) SpO2:  [88 %-90 %] 88 % (08/12 1135)  Weight change:  Filed Weights   05/09/19 1857 05/11/19 1357  Weight: 68 kg 68 kg    Intake/Output: I/O last 3 completed shifts: In: 661.8 [I.V.:661.8] Out: 1550 [LKGMW:1027]   Intake/Output this shift:  No intake/output data recorded.  Physical Exam Constitutional:      Appearance: Normal appearance.  HENT:     Head: Normocephalic.     Nose: Nose normal.     Mouth/Throat:     Mouth: Mucous membranes are moist.  Eyes:     Conjunctiva/sclera: Conjunctivae normal.     Comments: Left prosthetic eye  Neck:     Musculoskeletal: Neck supple.  Cardiovascular:     Rate and Rhythm: Normal rate and regular rhythm.     Heart sounds: Normal heart sounds.  Pulmonary:     Effort: Pulmonary effort is normal.     Breath sounds: Normal breath sounds.  Abdominal:     General: Abdomen is flat. Bowel sounds are normal.     Palpations: Abdomen is soft.  Genitourinary:    Comments: Condom cathter in place Musculoskeletal:        General: Swelling present. No tenderness.     Comments: Trace edema  Neurological:     Mental Status: He is alert and oriented to person, place, and time. Mental status is at baseline.  Psychiatric:        Mood and Affect: Mood normal.      Basic Metabolic Panel: Recent Labs  Lab 05/14/19 1454 05/15/19 0528 05/16/19 0459 05/17/19 1038 05/18/19 0637  NA 139 143 141 139 141  K 4.7 4.9 5.4* 5.3* 5.0  CL 115* 119* 117* 115* 119*  CO2 20* 20* 21* 19* 18*  GLUCOSE 134* 53* 95 193* 105*  BUN 47* 48* 44* 42* 45*  CREATININE 3.12* 3.20* 3.05* 3.11* 3.31*  CALCIUM  7.5* 7.7* 7.7* 7.6* 7.5*    Liver Function Tests: No results for input(s): AST, ALT, ALKPHOS, BILITOT, PROT, ALBUMIN in the last 168 hours. No results for input(s): LIPASE, AMYLASE in the last 168 hours. No results for input(s): AMMONIA in the last 168 hours.  CBC: Recent Labs  Lab 05/12/19 0646 05/13/19 0635 05/14/19 1454  WBC 6.2 5.6 7.4  HGB 8.8* 8.2* 8.2*  HCT 27.4* 25.8* 25.7*  MCV 90.7 91.5 91.5  PLT 136* 120* 121*    Cardiac Enzymes: No results for input(s): CKTOTAL, CKMB, CKMBINDEX, TROPONINI in the last 168 hours.  BNP: Invalid input(s): POCBNP  CBG: Recent Labs  Lab 05/17/19 1140 05/17/19 1632 05/17/19 2128 05/18/19 0739 05/18/19 1131  GLUCAP 165* 167* 111* 110* 148*    Microbiology: Results for orders placed or performed during the hospital encounter of 05/09/19  SARS Coronavirus 2 [facility] order, Performed in [facility] hospital lab)     Status: None   Collection Time: 05/09/19  9:28 PM  Result Value Ref Range Status   SARS Coronavirus 2 NEGATIVE NEGATIVE Final    Comment: (NOTE) If result is NEGATIVE SARS-CoV-2 target nucleic acids are NOT DETECTED. The SARS-CoV-2 RNA is generally detectable in upper and lower  respiratory specimens during the acute phase of infection. The lowest  concentration of SARS-CoV-2 viral copies this assay can detect is 250  copies / mL. A negative result does not preclude SARS-CoV-2 infection  and should not be used as the sole basis for treatment or other  patient management decisions.  A negative result may occur with  improper specimen collection / handling, submission of specimen other  than nasopharyngeal swab, presence of viral mutation(s) within the  areas targeted by this assay, and inadequate number of viral copies  (<250 copies / mL). A negative result must be combined with clinical  observations, patient history, and epidemiological information. If result is POSITIVE SARS-CoV-2 target nucleic acids are  DETECTED. The SARS-CoV-2 RNA is generally detectable in upper and lower  respiratory specimens dur ing the acute phase of infection.  Positive  results are indicative of active infection with SARS-CoV-2.  Clinical  correlation with patient history and other diagnostic information is  necessary to determine patient infection status.  Positive results do  not rule out bacterial infection or co-infection with other viruses. If result is PRESUMPTIVE POSTIVE SARS-CoV-2 nucleic acids MAY BE PRESENT.   A presumptive positive result was obtained on the submitted specimen  and confirmed on repeat testing.  While 2019 novel coronavirus  (SARS-CoV-2) nucleic acids may be present in the submitted sample  additional confirmatory testing may be necessary for epidemiological  and / or clinical management purposes  to differentiate between  SARS-CoV-2 and other Sarbecovirus currently known to infect humans.  If clinically indicated additional testing with an alternate test  methodology [phone]) is advised. The SARS-CoV-2 RNA is generally  detectable in upper and lower respiratory sp ecimens during the acute  phase of infection. The expected result is Negative. Fact Sheet for Patients:  StrictlyIdeas.no Fact Sheet for Healthcare Providers: BankingDealers.co.za This test is not yet approved or cleared by the [place] FDA and has been authorized for detection and/or diagnosis of SARS-CoV-2 by FDA under an Emergency Use Authorization (EUA).  This EUA will remain in effect (meaning this test can be used) for the duration of the COVID-19 declaration under Section 564(b)(1) of the Act, 21 U.S.C. section 360bbb-3(b)(1), unless the authorization is terminated or revoked sooner. Performed at [facility], [address] [place], [place] [address]   C difficile quick scan w PCR reflex     Status: Abnormal   Collection Time: 05/14/19  4:51 AM    Specimen: Stool  Result Value Ref Range Status   C Diff antigen POSITIVE (A) NEGATIVE Final   C Diff toxin NEGATIVE NEGATIVE Final   C Diff interpretation Results are indeterminate. See PCR results.  Final    Comment: Performed at [facility], [address] [place], [place] [address]  C. Diff by PCR, Reflexed     Status: Abnormal   Collection Time: 05/14/19  4:51 AM  Result Value Ref Range Status   Toxigenic C. Difficile by PCR POSITIVE (A) NEGATIVE Final    Comment: Positive for toxigenic C. difficile with little to no toxin production. Only treat if clinical presentation suggests symptomatic illness. Performed at [facility], [address] [place], [place] [address]     Coagulation Studies: No results for input(s): LABPROT, INR in the last 72 hours.  Urinalysis: No results for input(s): COLORURINE, LABSPEC, PHURINE, GLUCOSEU, HGBUR, BILIRUBINUR, KETONESUR, PROTEINUR, UROBILINOGEN, NITRITE, LEUKOCYTESUR in the last 72 hours.  Invalid input(s): APPERANCEUR    Imaging: No results found.  Medications:   . sodium chloride 75 mL/hr at 05/18/19 1229   . amLODipine  10 mg Oral Daily  . aspirin EC  81 mg Oral Daily  . atorvastatin  40 mg Oral QHS  . citalopram  20 mg Oral Daily  . clopidogrel  75 mg Oral Daily  . divalproex  500 mg Oral BID  . feeding supplement (GLUCERNA SHAKE)  237 mL Oral BID BM  . insulin aspart  0-15 Units Subcutaneous TID WC  . insulin aspart  0-5 Units Subcutaneous QHS  . levothyroxine  100 mcg Oral Q0600   And  . levothyroxine  25 mcg Oral Q0600  . lipase/protease/amylase  12,000 Units Oral TID WC  . metoprolol tartrate  25 mg Oral BID  . multivitamin  1 tablet Oral QHS  . pneumococcal 23 valent vaccine  0.5 mL Intramuscular Tomorrow-1000  . sodium bicarbonate  650 mg Oral BID  . sodium zirconium cyclosilicate  10 g Oral Daily  . tamsulosin  0.4 mg Oral Daily  . vancomycin  500 mg Oral QID   acetaminophen **OR**  acetaminophen, hydrALAZINE, ondansetron **OR** ondansetron (ZOFRAN) IV, polyethylene glycol, promethazine  Assessment/ Plan:  [name] is a 68 y.o. black male withLeft eye prosthesis, diabetes mellitus type 2, hypertension, syncope, and chronic kidney disease stage IV followed by [facility], who was admitted to [facility] on 05/09/2019 for fall and possible seizure.   1. Acute renal failure on Chronic kidney disease stage IV with hyperkalemia and metabolic acidosis  Follows with [facility]. Baseline Cr 3.0/GFR 25 from 04/06/2019 History of acute renal failure requiring hemodialylsis for several months. Has been off dialysis since 10/18/18.  Chronic kidney disease secondary to diabetic nephropathy and hypertension Acute renal failure secondary to prerenal azotemia and acute blood loss.  -Creatinine slightly worse at 3.3.  Still having ongoing diarrhea.  No indication for dialysis.  Continue to monitor renal parameters and avoid nephrotoxins as possible..   2. Hyperkalemia  Potassium down to 5.0.  Maintain the patient on [place] for now.  3. Anemia with chronic kidney disease/renal failure: with acute blood loss anemia. Status post PRBC transfusion on 8/3.  Consider Epogen as an outpatient. Lab Results  Component Value Date   HGB 8.2 (L) 05/14/2019      LOS: 9 Dagmar Adcox 8/12/20203:28 PM

## 2019-05-18 NOTE — Progress Notes (Signed)
[facility] NAME: Cameron Adams    MR#:  [id number]  DATE OF BIRTH:  10/04/1951  SUBJECTIVE:  CHIEF COMPLAINT:   Chief Complaint  Patient presents with  . Fall  Patient is awake and lying on the bed Diarrhea improving  Blood sugars have been better  No vomitings Decreased abdominal discomfort No fever REVIEW OF SYSTEMS:   Review of Systems  Constitutional: Has generalized weakness HENT: Negative.   Eyes: Negative.   Respiratory: Negative.   Cardiovascular: Negative.   Gastrointestinal: Has decreased diarrhea Genitourinary: Negative.   Musculoskeletal: Negative.   Skin: Negative.   Neurological: Negative.   Endo/Heme/Allergies: Negative.   Psychiatric/Behavioral: Negative.   All other systems reviewed and are negative. DRUG ALLERGIES:   Allergies  Allergen Reactions  . Gabapentin     Other reaction(s): Other (See Comments) Trouble sleeping    VITALS:  Blood pressure (!) 138/59, pulse 63, temperature 98.8 F (37.1 C), temperature source Oral, resp. rate 16, height 5\' 11"  (1.803 m), weight 68 kg, SpO2 (!) 88 %. PHYSICAL EXAMINATION:  GENERAL: Pleasant-appearing in no apparent distress.  HEAD, EYES, EARS, NOSE AND THROAT: Atraumatic, normocephalic. Extraocular muscles are intact. Pupils equal and reactive to light. Sclerae anicteric. No conjunctival injection. No oro-pharyngeal erythema.  NECK: Supple. There is no jugular venous distention. No bruits, no lymphadenopathy, no thyromegaly.  HEART: Regular rate and rhythm, tachycardic. No murmurs, no rubs, no clicks.  LUNGS: Clear to auscultation bilaterally. No rales or rhonchi. No wheezes.  ABDOMEN: Soft, flat, tenderness noted around the umbilicus, nondistended. Has good bowel sounds. No hepatosplenomegaly appreciated.  EXTREMITIES: No evidence of any cyanosis, clubbing, or peripheral edema.  +2 pedal and radial pulses bilaterally.  NEUROLOGIC: The patient  is alert, awake, and oriented x3 with no focal motor or sensory deficits appreciated bilaterally.  SKIN: Moist and warm with no rashes appreciated.  LYMPHATIC: No cervical or axillary lymphadenopathy.    LABORATORY PANEL:  Male CBC Recent Labs  Lab 05/14/19 1454  WBC 7.4  HGB 8.2*  HCT 25.7*  PLT 121*   ------------------------------------------------------------------------------------------------------------------ Chemistries  Recent Labs  Lab 05/18/19 0637  NA 141  K 5.0  CL 119*  CO2 18*  GLUCOSE 105*  BUN 45*  CREATININE 3.31*  CALCIUM 7.5*   RADIOLOGY:  No results found. ASSESSMENT AND PLAN:  68 y.o.malewith a known history of diabetes mellitus, CKD, diastolic CHF, history of seizure, hypothyroidism, and history of urinary retention admitted for AMS. AMS most likely multifactoriel includingi metabolic/electrolytes abnties, infections. Pateitn has a hs/o of sz on depakote.  1.  Acute blood loss anemia -Staus post EGD showing previous bleeding evidence from Mallory-Weiss tear.  Nothing acute -  Status post 1 unit packed red blood cells on this admission -H&H remained stable. Last Hemoglobin 8.2 Monitor hemoglobin hematocrit as needed  2.  Acute C. difficile colitis  diarrhea improving Continue oral vancomycin at higher dose Enteric precautions to continue  3.  Acute on chronic renal failure ckd stage IV: Creatinine 3.31-slowly improving -  nephrology following, renal function improving -IV fluids to continue and switched to normal saline -Avoid nephrotoxic meds -Off dialysis since 69/13/20 -No need for dialysis as of now  4.  Nausea/vomiting -Resolved  5.  Diabetes mellitus - Moderate sliding scale insulin -Diabetic diet  6.  Acute metabolic encephalopathy Improved -Seem to be back to his baseline mental status - Continue depakote 500 mg bid.  Normal ammonia level  7.  Hyperkalemia improved -Patient on lokelma  8.  Hypoglycemia Resolved   9.  Ambulatory dysfunction - physical therapy evaluation -SNF placement once clinically active issues are regular   All the records are reviewed and case discussed with Care Management/Social Worker. Management plans discussed with the patient, nursing and they are in agreement.  CODE STATUS: Full Code  TOTAL TIME TAKING CARE OF THIS PATIENT: 34 minutes.   More than 50% of the time was spent in counseling/coordination of care: YES  POSSIBLE D/C IN 1-2 DAYS, DEPENDING ON CLINICAL CONDITION.   Saundra Shelling M.D on 05/18/2019 at 12:44 PM  Between 7am to 6pm - Pager - [phone]  After 6pm go to [email or web address] - Proofreader  [facility]  Office  [phone]  CC: Primary care physician; [name], MD  Note: This dictation was prepared with Dragon dictation along with smaller phrase technology. Any transcriptional errors that result from this process are unintentional.

## 2019-05-18 NOTE — Plan of Care (Signed)
  Problem: Education: Goal: Knowledge of General Education information will improve Description: Including pain rating scale, medication(s)/side effects and non-pharmacologic comfort measures Outcome: Progressing Dr. Holley Raring and Dr. Estanislado Pandy rounded on pt this AM and dicussed POC.  Pt understands POC and had no further questions.  Will continue to monitor and assess.    Problem: Health Behavior/Discharge Planning: Goal: Ability to manage health-related needs will improve Outcome: Progressing Pt is able to verbalized POC and had no further questions concerning her hospitalization.    Problem: Clinical Measurements: Goal: Ability to maintain clinical measurements within normal limits will improve Outcome: Progressing Pt was hypertensive this shift.  He is on room air for O2. With O2 saturations at 88-90% and respirations of 16-18 breaths per minute.  He does not endorse SOB or DOE.  Will continue to monitor and assess.     Problem: Clinical Measurements: Goal: Will remain free from infection Outcome: Progressing S/Sx of infection monitor and assessed q4 hours/ PRN, along with VS.  Pt has remained afebrile thus far.  He is on PO abx per MD's orders.  Will continue to monitor and assess.    Problem: Clinical Measurements: Goal: Respiratory complications will improve Outcome: Progressing Respiratory status monitored and assessed q4 hours/ PRN, along with VS.  He is on room air for O2. With O2 saturations at 88-90% and respirations of 16-18 breaths per minute.  He does not endorse SOB or DOE.  Will continue to monitor and assess.   Problem: Activity: Goal: Risk for activity intolerance will decrease Outcome: Progressing Pt is able to get OOB 1x assist from RN staff.    Problem: Nutrition: Goal: Adequate nutrition will be maintained Outcome: Progressing Pt is on a carb modified/ heart healthy diet.  He was able to eat 85% of his meals this shift.  Will continue to monitor and assess.     Problem: Elimination: Goal: Will not experience complications related to bowel motility Outcome: Progressing Pt's LMB was 05/18/2019.     Problem: Pain Managment: Goal: General experience of comfort will improve Outcome: Progressing Pt has denied pain thus far.  Will continue to monitor and assess.    Problem: Safety: Goal: Ability to remain free from injury will improve Outcome: Progressing Instructed pt to utilize RN call light for assistance.  Hourly rounds performed.  Bed alarm implemented to keep pt safe from falls.  Settings set to third most sensitive setting.  Bed in lowest position, locked with two upper side rails engaged.  Belongings and call light within reach.     Problem: Skin Integrity: Goal: Risk for impaired skin integrity will decrease Outcome: Progressing Skin integrity monitored and assessed q-shift/ PRN.  Instructed pt to turn q2 hours to prevent further skin impairment.  Tubes and drains assessed for device related pressure sores.  Will continue to monitor and assess.

## 2019-05-18 NOTE — Progress Notes (Signed)
PT Cancellation Note  Patient Details Name: Cameron Adams MRN: [id number] DOB: 1951-07-11   Cancelled Treatment:    Reason Eval/Treat Not Completed: Fatigue/lethargy limiting ability to participate(Treatment session attempted.  Patient sleeping soundly upon arrival to room.  Awakens briefly with verbal/tactile cuing, but unable to maintain adequate alertness for participation with session.  Will re-attempt at later time/date as appropriate.)   Amely Voorheis H. Owens Shark, PT, DPT, NCS 05/18/19, 11:29 AM [phone]

## 2019-05-18 NOTE — Progress Notes (Signed)
PT Cancellation Note  Patient Details Name: Cameron Adams MRN: [id number] DOB: May 16, 1951   Cancelled Treatment:    Reason Eval/Treat Not Completed: Fatigue/lethargy limiting ability to participate. Treatment attempted; pt soundly sleeping and unable to be awoken for participation in PT. Re attempt at a later date.    Larae Grooms, PTA 05/18/2019, 5:09 PM

## 2019-05-19 LAB — BASIC METABOLIC PANEL
Anion gap: 6 (ref 5–15)
BUN: 47 mg/dL — ABNORMAL HIGH (ref 8–23)
CO2: 17 mmol/L — ABNORMAL LOW (ref 22–32)
Calcium: 7.4 mg/dL — ABNORMAL LOW (ref 8.9–10.3)
Chloride: 117 mmol/L — ABNORMAL HIGH (ref 98–111)
Creatinine, Ser: 3.17 mg/dL — ABNORMAL HIGH (ref 0.61–1.24)
GFR calc Af Amer: 22 mL/min — ABNORMAL LOW (ref >60)
GFR calc non Af Amer: 19 mL/min — ABNORMAL LOW (ref >60)
Glucose, Bld: 111 mg/dL — ABNORMAL HIGH (ref 70–99)
Potassium: 5.1 mmol/L (ref 3.5–5.1)
Sodium: 140 mmol/L (ref 135–145)

## 2019-05-19 LAB — GLUCOSE, CAPILLARY
Glucose-Capillary: 82 mg/dL (ref 70–99)
Glucose-Capillary: 149 mg/dL — ABNORMAL HIGH (ref 70–99)
Glucose-Capillary: 125 mg/dL — ABNORMAL HIGH (ref 70–99)
Glucose-Capillary: 146 mg/dL — ABNORMAL HIGH (ref 70–99)

## 2019-05-19 MED ORDER — SODIUM BICARBONATE 650 MG PO TABS
1300.0000 mg | ORAL_TABLET | Freq: Two times a day (BID) | ORAL | Status: DC
  Administered 2019-05-19 – 2019-05-21 (×4): 1300 mg via ORAL
  Filled 2019-05-19 (×4): qty 2

## 2019-05-19 MED ORDER — HYDRALAZINE HCL 10 MG PO TABS
10.0000 mg | ORAL_TABLET | Freq: Three times a day (TID) | ORAL | Status: DC
  Administered 2019-05-19 – 2019-05-20 (×3): 10 mg via ORAL
  Filled 2019-05-19 (×6): qty 1

## 2019-05-19 NOTE — Progress Notes (Signed)
[facility]  ROUNDING NOTE   Subjective:  Creatinine currently 3.2. Continues to have diarrhea.    Objective:  Vital signs in last 24 hours:  Temp:  [97.5 F (36.4 C)-98.2 F (36.8 C)] 97.5 F (36.4 C) (08/13 1144) Pulse Rate:  [50-58] 58 (08/13 1144) Resp:  [15-20] 15 (08/13 1144) BP: (146-159)/(64-70) 146/70 (08/13 1144) SpO2:  [92 %-99 %] 99 % (08/13 1144)  Weight change:  Filed Weights   05/09/19 1857 05/11/19 1357  Weight: 68 kg 68 kg    Intake/Output: I/O last 3 completed shifts: In: -  Out: 1350 [Urine:1350]   Intake/Output this shift:  No intake/output data recorded.  Physical Exam Constitutional:      Appearance: Normal appearance.  HENT:     Head: Normocephalic.     Nose: Nose normal.     Mouth/Throat:     Mouth: Mucous membranes are moist.  Eyes:     Conjunctiva/sclera: Conjunctivae normal.     Comments: Left prosthetic eye  Neck:     Musculoskeletal: Neck supple.  Cardiovascular:     Rate and Rhythm: Normal rate and regular rhythm.     Heart sounds: Normal heart sounds.  Pulmonary:     Effort: Pulmonary effort is normal.     Breath sounds: Normal breath sounds.  Abdominal:     General: Abdomen is flat. Bowel sounds are normal.     Palpations: Abdomen is soft.  Genitourinary:    Comments: Condom catheter in place Musculoskeletal:        General: Swelling present. No tenderness.     Comments: Trace edema  Neurological:     Mental Status: He is alert and oriented to person, place, and time. Mental status is at baseline.  Psychiatric:        Mood and Affect: Mood normal.      Basic Metabolic Panel: Recent Labs  Lab 05/15/19 0528 05/16/19 0459 05/17/19 1038 05/18/19 0637 05/19/19 0355  NA 143 141 139 141 140  K 4.9 5.4* 5.3* 5.0 5.1  CL 119* 117* 115* 119* 117*  CO2 20* 21* 19* 18* 17*  GLUCOSE 53* 95 193* 105* 111*  BUN 48* 44* 42* 45* 47*  CREATININE 3.20* 3.05* 3.11* 3.31* 3.17*  CALCIUM 7.7* 7.7* 7.6* 7.5*  7.4*    Liver Function Tests: No results for input(s): AST, ALT, ALKPHOS, BILITOT, PROT, ALBUMIN in the last 168 hours. No results for input(s): LIPASE, AMYLASE in the last 168 hours. No results for input(s): AMMONIA in the last 168 hours.  CBC: Recent Labs  Lab 05/13/19 0635 05/14/19 1454  WBC 5.6 7.4  HGB 8.2* 8.2*  HCT 25.8* 25.7*  MCV 91.5 91.5  PLT 120* 121*    Cardiac Enzymes: No results for input(s): CKTOTAL, CKMB, CKMBINDEX, TROPONINI in the last 168 hours.  BNP: Invalid input(s): POCBNP  CBG: Recent Labs  Lab 05/18/19 1131 05/18/19 1650 05/18/19 2250 05/19/19 0800 05/19/19 1128  GLUCAP 148* 126* 95 82 149*    Microbiology: Results for orders placed or performed during the hospital encounter of 05/09/19  SARS Coronavirus 2 [facility] order, Performed in [place] hospital lab)     Status: None   Collection Time: 05/09/19  9:28 PM  Result Value Ref Range Status   SARS Coronavirus 2 NEGATIVE NEGATIVE Final    Comment: (NOTE) If result is NEGATIVE SARS-CoV-2 target nucleic acids are NOT DETECTED. The SARS-CoV-2 RNA is generally detectable in upper and lower  respiratory specimens during the acute phase of  infection. The lowest  concentration of SARS-CoV-2 viral copies this assay can detect is 250  copies / mL. A negative result does not preclude SARS-CoV-2 infection  and should not be used as the sole basis for treatment or other  patient management decisions.  A negative result may occur with  improper specimen collection / handling, submission of specimen other  than nasopharyngeal swab, presence of viral mutation(s) within the  areas targeted by this assay, and inadequate number of viral copies  (<250 copies / mL). A negative result must be combined with clinical  observations, patient history, and epidemiological information. If result is POSITIVE SARS-CoV-2 target nucleic acids are DETECTED. The SARS-CoV-2 RNA is generally detectable in upper and  lower  respiratory specimens dur ing the acute phase of infection.  Positive  results are indicative of active infection with SARS-CoV-2.  Clinical  correlation with patient history and other diagnostic information is  necessary to determine patient infection status.  Positive results do  not rule out bacterial infection or co-infection with other viruses. If result is PRESUMPTIVE POSTIVE SARS-CoV-2 nucleic acids MAY BE PRESENT.   A presumptive positive result was obtained on the submitted specimen  and confirmed on repeat testing.  While 2019 novel coronavirus  (SARS-CoV-2) nucleic acids may be present in the submitted sample  additional confirmatory testing may be necessary for epidemiological  and / or clinical management purposes  to differentiate between  SARS-CoV-2 and other Sarbecovirus currently known to infect humans.  If clinically indicated additional testing with an alternate test  methodology [phone]) is advised. The SARS-CoV-2 RNA is generally  detectable in upper and lower respiratory sp ecimens during the acute  phase of infection. The expected result is Negative. Fact Sheet for Patients:  StrictlyIdeas.no Fact Sheet for Healthcare Providers: BankingDealers.co.za This test is not yet approved or cleared by the [place] FDA and has been authorized for detection and/or diagnosis of SARS-CoV-2 by FDA under an Emergency Use Authorization (EUA).  This EUA will remain in effect (meaning this test can be used) for the duration of the COVID-19 declaration under Section 564(b)(1) of the Act, 21 U.S.C. section 360bbb-3(b)(1), unless the authorization is terminated or revoked sooner. Performed at [facility], [address] [place], [place] [address]   C difficile quick scan w PCR reflex     Status: Abnormal   Collection Time: 05/14/19  4:51 AM   Specimen: Stool  Result Value Ref Range Status   C Diff antigen  POSITIVE (A) NEGATIVE Final   C Diff toxin NEGATIVE NEGATIVE Final   C Diff interpretation Results are indeterminate. See PCR results.  Final    Comment: Performed at [facility], [address] [place], [place] [address]  C. Diff by PCR, Reflexed     Status: Abnormal   Collection Time: 05/14/19  4:51 AM  Result Value Ref Range Status   Toxigenic C. Difficile by PCR POSITIVE (A) NEGATIVE Final    Comment: Positive for toxigenic C. difficile with little to no toxin production. Only treat if clinical presentation suggests symptomatic illness. Performed at [facility], [address] [place], [place] [address]     Coagulation Studies: No results for input(s): LABPROT, INR in the last 72 hours.  Urinalysis: No results for input(s): COLORURINE, LABSPEC, PHURINE, GLUCOSEU, HGBUR, BILIRUBINUR, KETONESUR, PROTEINUR, UROBILINOGEN, NITRITE, LEUKOCYTESUR in the last 72 hours.  Invalid input(s): APPERANCEUR    Imaging: No results found.   Medications:    . amLODipine  10 mg Oral Daily  . aspirin EC  81 mg Oral Daily  . atorvastatin  40 mg Oral QHS  . citalopram  20 mg Oral Daily  . clopidogrel  75 mg Oral Daily  . divalproex  500 mg Oral BID  . feeding supplement (GLUCERNA SHAKE)  237 mL Oral BID BM  . insulin aspart  0-15 Units Subcutaneous TID WC  . insulin aspart  0-5 Units Subcutaneous QHS  . levothyroxine  100 mcg Oral Q0600   And  . levothyroxine  25 mcg Oral Q0600  . lipase/protease/amylase  12,000 Units Oral TID WC  . metoprolol tartrate  25 mg Oral BID  . multivitamin  1 tablet Oral QHS  . pneumococcal 23 valent vaccine  0.5 mL Intramuscular Tomorrow-1000  . sodium bicarbonate  650 mg Oral BID  . sodium zirconium cyclosilicate  10 g Oral Daily  . tamsulosin  0.4 mg Oral Daily  . vancomycin  500 mg Oral QID   acetaminophen **OR** acetaminophen, hydrALAZINE, ondansetron **OR** ondansetron (ZOFRAN) IV, polyethylene glycol,  promethazine  Assessment/ Plan:  [name] is a 68 y.o. black male withLeft eye prosthesis, diabetes mellitus type 2, hypertension, syncope, and chronic kidney disease stage IV followed by [facility], who was admitted to [facility] on 05/09/2019 for fall and possible seizure.   1. Acute renal failure on Chronic kidney disease stage IV with hyperkalemia and metabolic acidosis  Follows with [facility]. Baseline Cr 3.0/GFR 25 from 04/06/2019 History of acute renal failure requiring hemodialylsis for several months. Has been off dialysis since 10/18/18.  Chronic kidney disease secondary to diabetic nephropathy and hypertension Acute renal failure secondary to prerenal azotemia and acute blood loss.  -Creatinine down slightly to 3.1.  This is close to his baseline.  Continue to monitor renal parameters daily.   2. Hyperkalemia  Potassium stable at 5.1.  Maintain the patient on Lokelma.  3. Anemia with chronic kidney disease/renal failure: with acute blood loss anemia. Status post PRBC transfusion on 8/3.  Consider Epogen as an outpatient. Lab Results  Component Value Date   HGB 8.2 (L) 05/14/2019   4.  Metabolic acidosis.  Increase sodium bicarbonate to 1300 mg p.o. twice daily.   LOS: 10 [place] 8/13/202011:58 AM

## 2019-05-19 NOTE — TOC Initial Note (Signed)
Transition of Care [facility]) - Initial/Assessment Note    Patient Details  Name: Cameron Adams MRN: [id number] Date of Birth: May 29, 1951  [facility]) CM/SW Contact:    Beverly Sessions, RN Phone Number: 05/19/2019, 4:09 PM  Clinical Narrative:                   Patient last worked with PT on 8/7.  At that time recommendation were for home health PT.  Per [facility] Klu[facility] at [facility] the would be able to accept patient back at discharge, however is there is not a medical need at discharge Medicare may deny and bill patient.   RNCM reached out to daughter Ms. Yellock.  She states that prior to being admitted to [facility] patient resides with her.  Currently due to the pandemic she is working remotely from home and able to provide 24/7 support.  She states that if and when she returns to work on site patient would be left alone for periods during the day.   Daughter was provided with information regarding patient returning to [facility]  With the current recommendations.  She would like to discuss with her sister.  Daughter informed that PT would attempt to see patient today, and I would update her based on their recommendation today  Update: PT has worked with patient today. Recommendations have been changed to SNF. Per PT was very lethargic during session.  Daughter called to update, voicemail was left, awaiting return call  Otila Kluver at [facility] updated    Expected Discharge Plan: [place] Barriers to Discharge: Continued Medical Work up   Patient Goals and CMS Choice Patient states their goals for this hospitalization and ongoing recovery are:: Patient not oriented.   Choice offered to / list presented to : NA  Expected Discharge Plan and Services Expected Discharge Plan: [place] Choice: [place] Living arrangements for the past 2 months: [place], [place] Expected Discharge Date: 05/13/19                                     Prior Living Arrangements/Services Living arrangements for the past 2 months: [place], [place] Lives with:: Adult Children Patient language and need for interpreter reviewed:: Yes Do you feel safe going back to the place where you live?: Yes      Need for Family Participation in Patient Care: Yes (Comment) Care giver support system in place?: Yes (comment)   Criminal Activity/Legal Involvement Pertinent to Current Situation/Hospitalization: No - Comment as needed  Activities of Daily Living Home Assistive Devices/Equipment: Wheelchair, Environmental consultant (specify type) ADL Screening (condition at time of admission) Patient's cognitive ability adequate to safely complete daily activities?: No Is the patient deaf or have difficulty hearing?: No Does the patient have difficulty seeing, even when wearing glasses/contacts?: Yes Does the patient have difficulty concentrating, remembering, or making decisions?: Yes Patient able to express need for assistance with ADLs?: No Does the patient have difficulty dressing or bathing?: Yes Independently performs ADLs?: No Communication: Needs assistance Does the patient have difficulty walking or climbing stairs?: Yes Weakness of Legs: Both Weakness of Arms/Hands: Both  Permission Sought/Granted Permission sought to share information with : Facility Sport and exercise psychologist, Family Supports Permission granted to share information with : (Patient not oriented.)  Share Information with NAME: [name]  Permission granted to  share info w AGENCY: [facility]  Permission granted to share info w Relationship: Daughter/HCPOA  Permission granted to share info w Contact Information: [phone]  Emotional Assessment Appearance:: Appears stated age Attitude/Demeanor/Rapport: Unable to Assess Affect (typically observed): Unable to Assess Orientation: : (Disoriented x 4) Alcohol / Substance Use:  Never Used Psych Involvement: Yes (comment)(Consulted)  Admission diagnosis:  Fall, initial encounter [W19.XXXA] Anemia, unspecified type [D64.9] Acute renal failure superimposed on chronic kidney disease, unspecified CKD stage, unspecified acute renal failure type ([place]) [N17.9, N18.9] Patient Active Problem List   Diagnosis Date Noted  . Dehydration 05/09/2019  . Confusion   . Visual hallucination   . Altered mental status 04/24/2019  . Syncope and collapse 01/12/2019  . Pancreatic insufficiency 12/28/2018  . Chronic systolic CHF (congestive heart failure) ([place]) 12/13/2018  . History of TIA (transient ischemic attack) 12/13/2018  . Weakness   . AKI (acute kidney injury) ([place]) 12/04/2018  . Arm wound, right, initial encounter 07/21/2018  . Wound infection 07/21/2018  . Orthostatic hypotension dysautonomic syndrome ([place]) 07/07/2018  . Acute renal failure superimposed on stage 3 chronic kidney disease ([place]) 12/09/2017  . Dizziness 06/22/2017  . Hyperkalemia 06/16/2017  . Esophageal dysmotilities   . Dysphagia   . Rib fractures 05/17/2017  . Hemothorax, right 05/17/2017  . Recurrent major depressive disorder, in partial remission ([place]) 05/13/2017  . Pure hypercholesterolemia 05/13/2017  . IDDM (insulin dependent diabetes mellitus) ([place]) 05/13/2017  . Hypothyroidism (acquired) 05/13/2017  . GAD (generalized anxiety disorder) 05/13/2017  . CKD (chronic kidney disease) stage 3, GFR 30-59 ml/min (HCC) 05/13/2017  . Toe gangrene ([place]) 09/16/2016  . PVD (peripheral vascular disease) ([place]) 09/16/2016  . Uncontrolled diabetes mellitus ([place]) 09/16/2016  . Hyponatremia 09/16/2016  . Hypokalemia 09/16/2016  . Essential hypertension, malignant 09/16/2016  . Abnormal angiogram 09/16/2016  . Gangrene ([place]) 09/13/2016  . Pruritus 03/26/2016  . Orthostatic hypotension 03/26/2016  . 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 Alcohol abuse 03/26/2016  . Hyperglycemia  03/23/2016  . Passive suicidal ideations 09/16/2014  . MDD (major depressive disorder) 09/16/2014  . Dyspnea 11/09/2013  . CAP (community acquired pneumonia) 10/31/2013  . Chest pain 10/31/2013  . Chronic respiratory failure with hypoxia ([place]) 10/31/2013  . 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 Diabetic retinopathy ([place]) 06/16/2013  . Gynecomastia 04/18/2013   PCP:  [name], MD Pharmacy:   [id number] [place], [address] [place] [address] Phone: [phone] Fax: [phone]     Social Determinants of Health (SDOH) Interventions    Readmission Risk Interventions Readmission Risk Prevention Plan 04/26/2019 01/16/2019  Transportation Screening Complete Complete  PCP or Specialist Appt within 3-5 Days - Complete  HRI or [place] - Complete  Social Work Consult for [place] Planning/Counseling - Complete  Palliative Care Screening - Not Applicable  Medication Review Press photographer) Complete Complete  HRI or Home Care Consult Complete -  SW Recovery Care/Counseling Consult Complete -  Palliative Care Screening Not Applicable -  [place] Patient Refused -  Some recent data might be hidden

## 2019-05-19 NOTE — Progress Notes (Signed)
Physical Therapy Treatment Patient Details Name: Cameron Adams MRN: [id number] DOB: 09-Mar-1951 Today's Date: 05/19/2019    History of Present Illness 68 y.o. male with a known history of diabetes mellitus, CKD, diastolic CHF, history of seizure, hypothyroidism, CVA, Lt prosthetic eye, and history of urinary retention.  Patient presented to the emergency room after sustaining a fall while attempting to get into his wheelchair, apparently he hit his R eye at this time as well. Pt has been at [facility] for STR since prior admission.    PT Comments    Pt sleeping, but able to awaken through voice and agreeable to PT. Pt remains very lethargic often falling back asleep. Performs limited repetitions of several exercises requiring re awakening throughout. Pt demonstrates little active motion. Ultimately pt falls asleep with inability to awaken. No further attempt. Continue PT to progress participation, active/active assisted range, strength and endurance to improve functional mobility.    Follow Up Recommendations  SNF     Equipment Recommendations       Recommendations for Other Services       Precautions / Restrictions Precautions Precautions: Fall Precaution Comments: Hx of seizures Restrictions Weight Bearing Restrictions: No    Mobility  Bed Mobility               General bed mobility comments: Not tested  Transfers                    Ambulation/Gait                 Stairs             Wheelchair Mobility    Modified Rankin (Stroke Patients Only)       Balance                                            Cognition Arousal/Alertness: Lethargic Behavior During Therapy: Flat affect Overall Cognitive Status: Within Functional Limits for tasks assessed(Very limited due to lethargy)                                        Exercises General Exercises - Lower Extremity Ankle Circles/Pumps: AAROM;AROM;Both;10  reps(greater active on R versus L) Quad Sets: Other (comment)(attempted; does not follow instruction) Heel Slides: AAROM;Both;5 reps Hip ABduction/ADduction: AAROM;PROM;Both;5 reps    General Comments        Pertinent Vitals/Pain Pain Assessment: No/denies pain    Home Living                      Prior Function            PT Goals (current goals can now be found in the care plan section) Progress towards PT goals: Not progressing toward goals - comment    Frequency    Min 2X/week      PT Plan Discharge plan needs to be updated    Co-evaluation              AM-PAC PT "6 Clicks" Mobility   Outcome Measure  Help needed turning from your back to your side while in a flat bed without using bedrails?: Total Help needed moving from lying on your back to sitting on the side of a flat bed without using bedrails?:  Total Help needed moving to and from a bed to a chair (including a wheelchair)?: Total Help needed standing up from a chair using your arms (e.g., wheelchair or bedside chair)?: Total Help needed to walk in hospital room?: Total Help needed climbing 3-5 steps with a railing? : Total 6 Click Score: 6    End of Session   Activity Tolerance: Patient limited by lethargy Patient left: with bed alarm set;with call bell/phone within reach   PT Visit Diagnosis: Other abnormalities of gait and mobility (R26.89);Muscle weakness (generalized) (M62.81);Difficulty in walking, not elsewhere classified (R26.2)     Time: 0768-0881 PT Time Calculation (min) (ACUTE ONLY): 13 min  Charges:  $Therapeutic Exercise: 8-22 mins                      Larae Grooms, PTA 05/19/2019, 1:40 PM

## 2019-05-19 NOTE — Progress Notes (Signed)
Nutrition Follow Up Note   DOCUMENTATION CODES:   Not applicable  INTERVENTION:   Glucerna Shake po BID, each supplement provides 220 kcal and 10 grams of protein  Rena-vite daily   NUTRITION DIAGNOSIS:   Increased nutrient needs related to chronic illness(CHF, CKD) as evidenced by increased estimated needs.  GOAL:   Patient will meet greater than or equal to 90% of their needs  -progressing   MONITOR:   PO intake, Supplement acceptance, Labs, Weight trends, Skin, I & O's  ASSESSMENT:   68 y.o. male with a known history of diabetes mellitus, CKD, diastolic CHF, history of seizure, hypothyroidism, and history of urinary retention admitted s/p fall and with GIB. Pt now with C-diff.  Pt s/p EGD 8/5; pt noted to have mallory-weiss tears  Pt continues to have fair appetite and oral intake in hospital. Pt's appetite slightly decreased over the past week r/t abdominal pain and diarrhea secondary to C-diff. Pt is drinking the Glucerna. No new weight since admit; will request weekly weights.   Medications reviewed and include: aspirin, celexa, plavix, insulin, synthroid, creon, lokelma, rena-vite, protonix, NaBicarb, vancomycin   Labs reviewed: K 5.1 wnl, BUN 47(H), creat 3.17(H) Hgb 8.2(L), Hct 25.7(L)  Diet Order:   Diet Order            Diet Carb Modified Fluid consistency: Thin; Room service appropriate? Yes  Diet effective now             EDUCATION NEEDS:   No education needs have been identified at this time  Skin:  Skin Assessment: Reviewed RN Assessment  Last BM:  8/13- type 7  Height:   Ht Readings from Last 1 Encounters:  05/11/19 5\' 11"  (1.803 m)    Weight:   Wt Readings from Last 1 Encounters:  05/11/19 68 kg    Ideal Body Weight:  78 kg  BMI:  Body mass index is 20.92 kg/m.  Estimated Nutritional Needs:   Kcal:  1900-2200kcal/day  Protein:  95-110g/day  Fluid:  >1.7L/day  Koleen Distance MS, RD, LDN Pager #- [phone] Office#-  [phone] After Hours Pager: [phone]

## 2019-05-19 NOTE — Plan of Care (Signed)
Patient remained confused over the shift. Patient continues to have frequent bowel movements. Patient now resting quietly in bed. Will continue to monitor.

## 2019-05-19 NOTE — Progress Notes (Signed)
[place] [facility] [place] NAME: Atlee Kluth    MR#:  [id number]  DATE OF BIRTH:  1950/12/02  SUBJECTIVE:  CHIEF COMPLAINT:   Chief Complaint  Patient presents with  . Fall  Patient is awake and lying on the bed Diarrhea improving every day Has been bradycardic overnight No vomitings Decreased abdominal discomfort No fever REVIEW OF SYSTEMS:   Review of Systems  Constitutional: Has generalized weakness HENT: Negative.   Eyes: Negative.   Respiratory: Negative.   Cardiovascular: Negative.   Gastrointestinal: Has decreased diarrhea Genitourinary: Negative.   Musculoskeletal: Negative.   Skin: Negative.   Neurological: Negative.   Endo/Heme/Allergies: Negative.   Psychiatric/Behavioral: Negative.   All other systems reviewed and are negative. DRUG ALLERGIES:   Allergies  Allergen Reactions  . Gabapentin     Other reaction(s): Other (See Comments) Trouble sleeping    VITALS:  Blood pressure (!) 146/70, pulse (!) 58, temperature (!) 97.5 F (36.4 C), temperature source Oral, resp. rate 15, height 5\' 11"  (1.803 m), weight 68 kg, SpO2 99 %. PHYSICAL EXAMINATION:  GENERAL: Pleasant-appearing in no apparent distress.  HEAD, EYES, EARS, NOSE AND THROAT: Atraumatic, normocephalic. Extraocular muscles are intact. Pupils equal and reactive to light. Sclerae anicteric. No conjunctival injection. No oro-pharyngeal erythema.  NECK: Supple. There is no jugular venous distention. No bruits, no lymphadenopathy, no thyromegaly.  HEART: S1-S2 bradycardia noted. No murmurs, no rubs, no clicks.  LUNGS: Clear to auscultation bilaterally. No rales or rhonchi. No wheezes.  ABDOMEN: Soft, flat, tenderness noted around the umbilicus, nondistended. Has good bowel sounds. No hepatosplenomegaly appreciated.  EXTREMITIES: No evidence of any cyanosis, clubbing, or peripheral edema.  +2 pedal and radial pulses bilaterally.  NEUROLOGIC: The patient  is alert, awake, and oriented x3 with no focal motor or sensory deficits appreciated bilaterally.  SKIN: Moist and warm with no rashes appreciated.  LYMPHATIC: No cervical or axillary lymphadenopathy.    LABORATORY PANEL:  Male CBC Recent Labs  Lab 05/14/19 1454  WBC 7.4  HGB 8.2*  HCT 25.7*  PLT 121*   ------------------------------------------------------------------------------------------------------------------ Chemistries  Recent Labs  Lab 05/19/19 0355  NA 140  K 5.1  CL 117*  CO2 17*  GLUCOSE 111*  BUN 47*  CREATININE 3.17*  CALCIUM 7.4*   RADIOLOGY:  No results found. ASSESSMENT AND PLAN:  68 y.o.malewith a known history of diabetes mellitus, CKD, diastolic CHF, history of seizure, hypothyroidism, and history of urinary retention admitted for AMS. AMS most likely multifactoriel includingi metabolic/electrolytes abnties, infections. Pateitn has a hs/o of sz on depakote.  1.  Acute blood loss anemia -Staus post EGD showing previous bleeding evidence from Mallory-Weiss tear.  Nothing acute -  Status post 1 unit packed red blood cells on this admission -H&H remained stable. Last Hemoglobin 8.2 Monitor hemoglobin hematocrit as needed  2.  Acute C. difficile colitis  diarrhea improving Continue oral vancomycin at higher dose Enteric precautions to continue  3.  Acute on chronic renal failure ckd stage IV:  With hyperkalemia and metabolic acidosis Does not require any dialysis as per nephrology Creatinine 3.17 -Avoid nephrotoxic meds -Off dialysis since 69/13/20 -No need for dialysis as of now  4.  Bradycardia Hold metoprolol  5.  Diabetes mellitus - Moderate sliding scale insulin -Diabetic diet  6.  Acute metabolic encephalopathy Improved -Seem to be back to his baseline mental status - Continue depakote 500 mg bid.  Normal ammonia level  7.  Hyperkalemia improved -Patient on lokelma  8.  Hypoglycemia Resolved  9.  Ambulatory dysfunction -  physical therapy evaluation  10.  Hypertension Start oral hydralazine   All the records are reviewed and case discussed with Care Management/Social Worker. Management plans discussed with the patient, nursing and they are in agreement.  CODE STATUS: Full Code  TOTAL TIME TAKING CARE OF THIS PATIENT: 34 minutes.   More than 50% of the time was spent in counseling/coordination of care: YES  POSSIBLE D/C IN 1-2 DAYS, DEPENDING ON CLINICAL CONDITION.   Saundra Shelling M.D on 05/19/2019 at 12:10 PM  Between 7am to 6pm - Pager - [phone]  After 6pm go to [email or web address] - Proofreader  [facility]  Office  [phone]  CC: Primary care physician; [name], MD  Note: This dictation was prepared with Dragon dictation along with smaller phrase technology. Any transcriptional errors that result from this process are unintentional.

## 2019-05-19 NOTE — Progress Notes (Signed)
Please note, patient is currently open to outpatient Palliative. He was previously seen at home, and was discharged to [facility] at last admission CSW [place] Scrape made aware. Flo Shanks BS, RN, [facility] [phone].

## 2019-05-20 LAB — CBC
HCT: 24.1 % — ABNORMAL LOW (ref 39.0–52.0)
Hemoglobin: 7.5 g/dL — ABNORMAL LOW (ref 13.0–17.0)
MCH: 29.5 pg (ref 26.0–34.0)
MCHC: 31.1 g/dL (ref 30.0–36.0)
MCV: 94.9 fL (ref 80.0–100.0)
Platelets: 146 10*3/uL — ABNORMAL LOW (ref 150–400)
RBC: 2.54 MIL/uL — ABNORMAL LOW (ref 4.22–5.81)
RDW: 14.4 % (ref 11.5–15.5)
WBC: 6.8 10*3/uL (ref 4.0–10.5)
nRBC: 0 % (ref 0.0–0.2)

## 2019-05-20 LAB — BASIC METABOLIC PANEL
Anion gap: 4 — ABNORMAL LOW (ref 5–15)
BUN: 45 mg/dL — ABNORMAL HIGH (ref 8–23)
CO2: 19 mmol/L — ABNORMAL LOW (ref 22–32)
Calcium: 7.6 mg/dL — ABNORMAL LOW (ref 8.9–10.3)
Chloride: 120 mmol/L — ABNORMAL HIGH (ref 98–111)
Creatinine, Ser: 2.95 mg/dL — ABNORMAL HIGH (ref 0.61–1.24)
GFR calc Af Amer: 24 mL/min — ABNORMAL LOW (ref >60)
GFR calc non Af Amer: 21 mL/min — ABNORMAL LOW (ref >60)
Glucose, Bld: 101 mg/dL — ABNORMAL HIGH (ref 70–99)
Potassium: 4.7 mmol/L (ref 3.5–5.1)
Sodium: 143 mmol/L (ref 135–145)

## 2019-05-20 LAB — GLUCOSE, CAPILLARY
Glucose-Capillary: 91 mg/dL (ref 70–99)
Glucose-Capillary: 141 mg/dL — ABNORMAL HIGH (ref 70–99)
Glucose-Capillary: 133 mg/dL — ABNORMAL HIGH (ref 70–99)
Glucose-Capillary: 150 mg/dL — ABNORMAL HIGH (ref 70–99)

## 2019-05-20 MED ORDER — HYDRALAZINE HCL 25 MG PO TABS
25.0000 mg | ORAL_TABLET | Freq: Two times a day (BID) | ORAL | Status: DC
  Administered 2019-05-20 – 2019-05-21 (×2): 25 mg via ORAL
  Filled 2019-05-20 (×2): qty 1

## 2019-05-20 MED ORDER — SODIUM CHLORIDE 0.9 % IV SOLN
INTRAVENOUS | Status: DC
  Administered 2019-05-20 – 2019-05-21 (×2): via INTRAVENOUS

## 2019-05-20 NOTE — Progress Notes (Signed)
[place] [facility] [place] NAME: Cameron Adams    MR#:  [id number]  DATE OF BIRTH:  12-May-1951  SUBJECTIVE:  CHIEF COMPLAINT:   Chief Complaint  Patient presents with  . Fall  Patient is awake and lying on the bed Had 2 episodes of diarrhea today Bradycardia has improved No vomitings Decreased abdominal discomfort No fever More awake and responds to verbal commands [facility] REVIEW OF SYSTEMS:   Review of Systems  Constitutional: Has generalized weakness HENT: Negative.   Eyes: Negative.   Respiratory: Negative.   Cardiovascular: Negative.   Gastrointestinal: Has  diarrhea Genitourinary: Negative.   Musculoskeletal: Negative.   Skin: Negative.   Neurological: Negative.   Endo/Heme/Allergies: Negative.   Psychiatric/Behavioral: Negative.   All other systems reviewed and are negative. DRUG ALLERGIES:   Allergies  Allergen Reactions  . Gabapentin     Other reaction(s): Other (See Comments) Trouble sleeping    VITALS:  Blood pressure (!) 176/69, pulse (!) 59, temperature 97.9 F (36.6 C), temperature source Oral, resp. rate 20, height 5\' 11"  (1.803 m), weight 91.2 kg, SpO2 96 %. PHYSICAL EXAMINATION:  GENERAL: Pleasant-appearing in no apparent distress.  HEAD, EYES, EARS, NOSE AND THROAT: Atraumatic, normocephalic. Extraocular muscles are intact. Pupils equal and reactive to light. Sclerae anicteric. No conjunctival injection. No oro-pharyngeal erythema.  NECK: Supple. There is no jugular venous distention. No bruits, no lymphadenopathy, no thyromegaly.  HEART: S1-S2 bradycardia noted. No murmurs, no rubs, no clicks.  LUNGS: Clear to auscultation bilaterally. No rales or rhonchi. No wheezes.  ABDOMEN: Soft, flat, tenderness noted around the umbilicus, nondistended. Has good bowel sounds. No hepatosplenomegaly appreciated.  EXTREMITIES: No evidence of any cyanosis, clubbing, or peripheral edema.  +2  pedal and radial pulses bilaterally.  NEUROLOGIC: The patient is alert, awake, and oriented x3 with no focal motor or sensory deficits appreciated bilaterally.  SKIN: Moist and warm with no rashes appreciated.  LYMPHATIC: No cervical or axillary lymphadenopathy.    LABORATORY PANEL:  Male CBC Recent Labs  Lab 05/20/19 0833  WBC 6.8  HGB 7.5*  HCT 24.1*  PLT 146*   ------------------------------------------------------------------------------------------------------------------ Chemistries  Recent Labs  Lab 05/20/19 0833  NA 143  K 4.7  CL 120*  CO2 19*  GLUCOSE 101*  BUN 45*  CREATININE 2.95*  CALCIUM 7.6*   RADIOLOGY:  No results found. ASSESSMENT AND PLAN:  68 y.o.malewith a known history of diabetes mellitus, CKD, diastolic CHF, history of seizure, hypothyroidism, and history of urinary retention admitted for AMS. AMS most likely multifactoriel includingi metabolic/electrolytes abnties, infections. Pateitn has a hs/o of sz on depakote.  1.  Acute blood loss anemia -Staus post EGD showing previous bleeding evidence from Mallory-Weiss tear.  Nothing acute -  Status post 1 unit packed red blood cells on this admission -H&H remained stable. Last Hemoglobin 8.2 Monitor hemoglobin hematocrit as needed Transfuse PRBC if hemoglobin less than 7  2.  Acute C. difficile colitis  diarrhea improving slowly Continue oral vancomycin at higher dose Enteric precautions to continue  3.  Acute on chronic renal failure ckd stage IV:  With hyperkalemia and metabolic acidosis Does not require any dialysis as per nephrology Creatinine 2.95 -Avoid nephrotoxic meds -Off dialysis since 10/18/18 -No need for dialysis as of now -IV fluids  4.  Bradycardia Improved  5.  Diabetes mellitus - Moderate sliding scale insulin -Diabetic diet  6.  Acute metabolic  encephalopathy Improved -Seem to be back to his baseline mental status - Continue depakote 500 mg bid.  Normal ammonia  level  7.  Hyperkalemia improved -Patient on lokelma  8.  Hypoglycemia Resolved  9.  Ambulatory dysfunction - physical therapy evaluation  10.  Hypertension Start oral hydralazine  11.  Family updated   All the records are reviewed and case discussed with Care Management/Social Worker. Management plans discussed with the patient, nursing and they are in agreement.  CODE STATUS: Full Code  TOTAL TIME TAKING CARE OF THIS PATIENT: 35 minutes.   More than 50% of the time was spent in counseling/coordination of care: YES  POSSIBLE D/C IN 1-2 DAYS, DEPENDING ON CLINICAL CONDITION.   Saundra Shelling M.D on 05/20/2019 at 11:19 AM  Between 7am to 6pm - Pager - [phone]  After 6pm go to [email or web address] - Proofreader  [facility]  Office  [phone]  CC: Primary care physician; [name], MD  Note: This dictation was prepared with Dragon dictation along with smaller phrase technology. Any transcriptional errors that result from this process are unintentional.

## 2019-05-20 NOTE — TOC Progression Note (Signed)
Transition of Care [facility]) - Progression Note    Patient Details  Name: Cameron Adams MRN: [id number] Date of Birth: May 18, 1951  Transition of Care [facility]) CM/SW Contact  Beverly Sessions, RN Phone Number: 05/20/2019, 4:17 PM  Clinical Narrative:    Spoke with daughter and updated that PT is now recommending SNF.  She was glad the the recommendation has been changed and states that her and her sister are still in agreement for patient to return to [facility] at discharge    Expected Discharge Plan: [place] Barriers to Discharge: Continued Medical Work up  Expected Discharge Plan and Services Expected Discharge Plan: [place]: [place] arrangements for the past 2 months: [place], [place] Expected Discharge Date: 05/13/19                                     Social Determinants of Health (SDOH) Interventions    Readmission Risk Interventions Readmission Risk Prevention Plan 04/26/2019 01/16/2019  Transportation Screening Complete Complete  PCP or Specialist Appt within 3-5 Days - Complete  HRI or [place] - Complete  Social Work Consult for [place] Planning/Counseling - Complete  Palliative Care Screening - Not Applicable  Medication Review Press photographer) Complete Complete  HRI or Home Care Consult Complete -  SW Recovery Care/Counseling Consult Complete -  [place] Patient Refused -  Some recent data might be hidden

## 2019-05-20 NOTE — Progress Notes (Signed)
[facility]  ROUNDING NOTE   Subjective:  Patient still having diarrhea at this point in time. Patient has known advanced renal dysfunction however creatinine currently down to 2.95.  Objective:  Vital signs in last 24 hours:  Temp:  [97.8 F (36.6 C)-98.4 F (36.9 C)] 98.4 F (36.9 C) (08/14 1143) Pulse Rate:  [58-66] 66 (08/14 1143) Resp:  [17-20] 17 (08/14 1143) BP: (138-176)/(52-69) 138/52 (08/14 1143) SpO2:  [92 %-96 %] 92 % (08/14 1143) Weight:  [91.2 kg] 91.2 kg (08/14 0524)  Weight change:  Filed Weights   05/09/19 1857 05/11/19 1357 05/20/19 0524  Weight: 68 kg 68 kg 91.2 kg    Intake/Output: I/O last 3 completed shifts: In: 0  Out: 1951 [Urine:1951]   Intake/Output this shift:  No intake/output data recorded.  Physical Exam Constitutional:      Appearance: Normal appearance.  HENT:     Head: Normocephalic.     Nose: Nose normal.     Mouth/Throat:     Mouth: Mucous membranes are moist.  Eyes:     Conjunctiva/sclera: Conjunctivae normal.     Comments: Left prosthetic eye  Neck:     Musculoskeletal: Neck supple.  Cardiovascular:     Rate and Rhythm: Normal rate and regular rhythm.     Heart sounds: Normal heart sounds.  Pulmonary:     Effort: Pulmonary effort is normal.     Breath sounds: Normal breath sounds.  Abdominal:     General: Abdomen is flat. Bowel sounds are normal.     Palpations: Abdomen is soft.  Genitourinary:    Comments: Condom catheter in place Musculoskeletal:        General: Swelling present. No tenderness.     Comments: Trace edema  Neurological:     Mental Status: He is alert and oriented to person, place, and time. Mental status is at baseline.  Psychiatric:        Mood and Affect: Mood normal.      Basic Metabolic Panel: Recent Labs  Lab 05/16/19 0459 05/17/19 1038 05/18/19 0637 05/19/19 0355 05/20/19 0833  NA 141 139 141 140 143  K 5.4* 5.3* 5.0 5.1 4.7  CL 117* 115* 119* 117* 120*  CO2 21* 19*  18* 17* 19*  GLUCOSE 95 193* 105* 111* 101*  BUN 44* 42* 45* 47* 45*  CREATININE 3.05* 3.11* 3.31* 3.17* 2.95*  CALCIUM 7.7* 7.6* 7.5* 7.4* 7.6*    Liver Function Tests: No results for input(s): AST, ALT, ALKPHOS, BILITOT, PROT, ALBUMIN in the last 168 hours. No results for input(s): LIPASE, AMYLASE in the last 168 hours. No results for input(s): AMMONIA in the last 168 hours.  CBC: Recent Labs  Lab 05/14/19 1454 05/20/19 0833  WBC 7.4 6.8  HGB 8.2* 7.5*  HCT 25.7* 24.1*  MCV 91.5 94.9  PLT 121* 146*    Cardiac Enzymes: No results for input(s): CKTOTAL, CKMB, CKMBINDEX, TROPONINI in the last 168 hours.  BNP: Invalid input(s): POCBNP  CBG: Recent Labs  Lab 05/19/19 0800 05/19/19 1128 05/19/19 1645 05/19/19 2135 05/20/19 0745  GLUCAP 82 149* 125* 146* 74    Microbiology: Results for orders placed or performed during the hospital encounter of 05/09/19  SARS Coronavirus 2 [facility] order, Performed in [place] hospital lab)     Status: None   Collection Time: 05/09/19  9:28 PM  Result Value Ref Range Status   SARS Coronavirus 2 NEGATIVE NEGATIVE Final    Comment: (NOTE) If result is NEGATIVE SARS-CoV-2  target nucleic acids are NOT DETECTED. The SARS-CoV-2 RNA is generally detectable in upper and lower  respiratory specimens during the acute phase of infection. The lowest  concentration of SARS-CoV-2 viral copies this assay can detect is 250  copies / mL. A negative result does not preclude SARS-CoV-2 infection  and should not be used as the sole basis for treatment or other  patient management decisions.  A negative result may occur with  improper specimen collection / handling, submission of specimen other  than nasopharyngeal swab, presence of viral mutation(s) within the  areas targeted by this assay, and inadequate number of viral copies  (<250 copies / mL). A negative result must be combined with clinical  observations, patient history, and  epidemiological information. If result is POSITIVE SARS-CoV-2 target nucleic acids are DETECTED. The SARS-CoV-2 RNA is generally detectable in upper and lower  respiratory specimens dur ing the acute phase of infection.  Positive  results are indicative of active infection with SARS-CoV-2.  Clinical  correlation with patient history and other diagnostic information is  necessary to determine patient infection status.  Positive results do  not rule out bacterial infection or co-infection with other viruses. If result is PRESUMPTIVE POSTIVE SARS-CoV-2 nucleic acids MAY BE PRESENT.   A presumptive positive result was obtained on the submitted specimen  and confirmed on repeat testing.  While 2019 novel coronavirus  (SARS-CoV-2) nucleic acids may be present in the submitted sample  additional confirmatory testing may be necessary for epidemiological  and / or clinical management purposes  to differentiate between  SARS-CoV-2 and other Sarbecovirus currently known to infect humans.  If clinically indicated additional testing with an alternate test  methodology [phone]) is advised. The SARS-CoV-2 RNA is generally  detectable in upper and lower respiratory sp ecimens during the acute  phase of infection. The expected result is Negative. Fact Sheet for Patients:  StrictlyIdeas.no Fact Sheet for Healthcare Providers: BankingDealers.co.za This test is not yet approved or cleared by the [place] FDA and has been authorized for detection and/or diagnosis of SARS-CoV-2 by FDA under an Emergency Use Authorization (EUA).  This EUA will remain in effect (meaning this test can be used) for the duration of the COVID-19 declaration under Section 564(b)(1) of the Act, 21 U.S.C. section 360bbb-3(b)(1), unless the authorization is terminated or revoked sooner. Performed at [facility], [address] [place], [place] [address]   C  difficile quick scan w PCR reflex     Status: Abnormal   Collection Time: 05/14/19  4:51 AM   Specimen: Stool  Result Value Ref Range Status   C Diff antigen POSITIVE (A) NEGATIVE Final   C Diff toxin NEGATIVE NEGATIVE Final   C Diff interpretation Results are indeterminate. See PCR results.  Final    Comment: Performed at [facility], [address] [place], [place] [address]  C. Diff by PCR, Reflexed     Status: Abnormal   Collection Time: 05/14/19  4:51 AM  Result Value Ref Range Status   Toxigenic C. Difficile by PCR POSITIVE (A) NEGATIVE Final    Comment: Positive for toxigenic C. difficile with little to no toxin production. Only treat if clinical presentation suggests symptomatic illness. Performed at [facility], [address] [place], [place] [address]     Coagulation Studies: No results for input(s): LABPROT, INR in the last 72 hours.  Urinalysis: No results for input(s): COLORURINE, LABSPEC, PHURINE, GLUCOSEU, HGBUR, BILIRUBINUR, KETONESUR, PROTEINUR, UROBILINOGEN, NITRITE, LEUKOCYTESUR in  the last 72 hours.  Invalid input(s): APPERANCEUR    Imaging: No results found.   Medications:   . sodium chloride 75 mL/hr at 05/20/19 1034   . amLODipine  10 mg Oral Daily  . atorvastatin  40 mg Oral QHS  . citalopram  20 mg Oral Daily  . clopidogrel  75 mg Oral Daily  . divalproex  500 mg Oral BID  . feeding supplement (GLUCERNA SHAKE)  237 mL Oral BID BM  . hydrALAZINE  25 mg Oral BID  . insulin aspart  0-15 Units Subcutaneous TID WC  . insulin aspart  0-5 Units Subcutaneous QHS  . levothyroxine  100 mcg Oral Q0600   And  . levothyroxine  25 mcg Oral Q0600  . lipase/protease/amylase  12,000 Units Oral TID WC  . multivitamin  1 tablet Oral QHS  . pneumococcal 23 valent vaccine  0.5 mL Intramuscular Tomorrow-1000  . sodium bicarbonate  1,300 mg Oral BID  . sodium zirconium cyclosilicate  10 g Oral Daily  . tamsulosin  0.4 mg Oral Daily  .  vancomycin  500 mg Oral QID   acetaminophen **OR** acetaminophen, hydrALAZINE, ondansetron **OR** ondansetron (ZOFRAN) IV, polyethylene glycol, promethazine  Assessment/ Plan:  Cameron Adams is a 68 y.o. black male withLeft eye prosthesis, diabetes mellitus type 2, hypertension, syncope, and chronic kidney disease stage IV followed by [facility], who was [facility] to [facility] on [facility]/09[facility] for fall and possible seizure.   1. Acute renal failure on Chronic kidney disease stage IV with hyperkalemia and metabolic acidosis  Follows with [facility]. Baseline Cr 3.0/GFR 25 from 04/06/2019 History of acute renal failure requiring hemodialylsis for several months. Has been off dialysis since 10/18/18.  Chronic kidney disease secondary to diabetic nephropathy and hypertension Acute renal failure secondary to prerenal azotemia and acute blood loss.  -Creatinine down to 2.95 today.  Good urine output.  Continue to periodically monitor.   2. Hyperkalemia  Potassium down to 4.7.  Okay to continue Lokelma 10 g p.o. daily.  3. Anemia with chronic kidney disease/renal failure: with acute blood loss anemia.  Hemoglobin has drifted down a bit.  Consider blood transfusion for hemoglobin of 7 or less. Lab Results  Component Value Date   HGB 7.5 (L) 05/20/2019   4.  Metabolic acidosis.  bicarbonate increased to 19.  Maintain the patient on sodium bicarbonate 1300 mg p.o. twice daily.   LOS: 11 Ayano Douthitt 8/14/202011:48 AM

## 2019-05-20 NOTE — Care Management Important Message (Signed)
Important Message  Patient Details  Name: Cameron Adams MRN: [id number] Date of Birth: [birth year]   Medicare Important Message Given:  Yes     Beverly Sessions, RN 05/20/2019, 5:09 PM

## 2019-05-21 LAB — BASIC METABOLIC PANEL
Anion gap: 5 (ref 5–15)
BUN: 45 mg/dL — ABNORMAL HIGH (ref 8–23)
CO2: 19 mmol/L — ABNORMAL LOW (ref 22–32)
Calcium: 7.5 mg/dL — ABNORMAL LOW (ref 8.9–10.3)
Chloride: 120 mmol/L — ABNORMAL HIGH (ref 98–111)
Creatinine, Ser: 3.1 mg/dL — ABNORMAL HIGH (ref 0.61–1.24)
GFR calc Af Amer: 23 mL/min — ABNORMAL LOW (ref >60)
GFR calc non Af Amer: 20 mL/min — ABNORMAL LOW (ref >60)
Glucose, Bld: 82 mg/dL (ref 70–99)
Potassium: 4.9 mmol/L (ref 3.5–5.1)
Sodium: 144 mmol/L (ref 135–145)

## 2019-05-21 LAB — GLUCOSE, CAPILLARY
Glucose-Capillary: 63 mg/dL — ABNORMAL LOW (ref 70–99)
Glucose-Capillary: 89 mg/dL (ref 70–99)

## 2019-05-21 LAB — HEMOGLOBIN AND HEMATOCRIT, BLOOD
HCT: 24.4 % — ABNORMAL LOW (ref 39.0–52.0)
Hemoglobin: 7.6 g/dL — ABNORMAL LOW (ref 13.0–17.0)

## 2019-05-21 LAB — SARS CORONAVIRUS 2 BY RT PCR (HOSPITAL ORDER, PERFORMED IN ~~LOC~~ HOSPITAL LAB): SARS Coronavirus 2: NEGATIVE

## 2019-05-21 MED ORDER — GLIPIZIDE 5 MG PO TABS: 2.5000 mg | ORAL_TABLET | Freq: Every day | ORAL | Status: AC

## 2019-05-21 MED ORDER — VANCOMYCIN 50 MG/ML ORAL SOLUTION: 500.0000 mg | Freq: Four times a day (QID) | ORAL | 0 refills | Status: DC

## 2019-05-21 MED ORDER — VANCOMYCIN 50 MG/ML ORAL SOLUTION: 125.0000 mg | Freq: Four times a day (QID) | ORAL | 0 refills | Status: AC

## 2019-05-21 MED ORDER — METOPROLOL TARTRATE 25 MG PO TABS: 37.5000 mg | ORAL_TABLET | Freq: Two times a day (BID) | ORAL | Status: AC

## 2019-05-21 NOTE — Progress Notes (Signed)
EMS came and concern that he was hard to wake -up. I explained to them  that is pt  baseline. They also questioned about swollen scrotum. I told them that MD (Dr, Benjie Karvonen has been notified about the scrotum and and no new order but to elevate his scrotum. It is dependent edema.

## 2019-05-21 NOTE — Progress Notes (Signed)
Discharge order received. Patient mental status is at baseline. Vital signs stable . No signs of acute distress. Discharge instructions given. Patient verbalized understanding. No other issues noted at this time.   

## 2019-05-21 NOTE — Progress Notes (Signed)
[facility]  ROUNDING NOTE   Subjective:  Diarrhea persists. He appears to have it primarily after he eats. Patient still on normal saline. Creatinine slightly higher today at 3.1.  However this is very close to his baseline.  Objective:  Vital signs in last 24 hours:  Temp:  [98.4 F (36.9 C)-98.9 F (37.2 C)] 98.7 F (37.1 C) (08/15 0546) Pulse Rate:  [66-71] 70 (08/15 0546) Resp:  [16-17] 16 (08/15 0546) BP: (138-156)/(52-64) 156/64 (08/15 0546) SpO2:  [90 %-92 %] 90 % (08/15 0546)  Weight change:  Filed Weights   05/09/19 1857 05/11/19 1357 05/20/19 0524  Weight: 68 kg 68 kg 91.2 kg    Intake/Output: I/O last 3 completed shifts: In: 632.7 [P.O.:240; I.V.:392.7] Out: 1500 [Urine:1500]   Intake/Output this shift:  Total I/O In: 1131.5 [I.V.:1131.5] Out: -   Physical Exam Constitutional:      Appearance: Normal appearance.  HENT:     Head: Normocephalic.     Nose: Nose normal.     Mouth/Throat:     Mouth: Mucous membranes are moist.  Eyes:     Conjunctiva/sclera: Conjunctivae normal.     Comments: Left prosthetic eye  Neck:     Musculoskeletal: Neck supple.  Cardiovascular:     Rate and Rhythm: Normal rate and regular rhythm.     Heart sounds: Normal heart sounds.  Pulmonary:     Effort: Pulmonary effort is normal.     Breath sounds: Normal breath sounds.  Abdominal:     General: Abdomen is flat. Bowel sounds are normal.     Palpations: Abdomen is soft.  Genitourinary:    Comments: Condom catheter in place Musculoskeletal:        General: Swelling present. No tenderness.     Comments: Trace edema  Neurological:     Mental Status: He is alert and oriented to person, place, and time. Mental status is at baseline.  Psychiatric:        Mood and Affect: Mood normal.      Basic Metabolic Panel: Recent Labs  Lab 05/17/19 1038 05/18/19 0637 05/19/19 0355 05/20/19 0833 05/21/19 0445  NA 139 141 140 143 144  K 5.3* 5.0 5.1 4.7 4.9   CL 115* 119* 117* 120* 120*  CO2 19* 18* 17* 19* 19*  GLUCOSE 193* 105* 111* 101* 82  BUN 42* 45* 47* 45* 45*  CREATININE 3.11* 3.31* 3.17* 2.95* 3.10*  CALCIUM 7.6* 7.5* 7.4* 7.6* 7.5*    Liver Function Tests: No results for input(s): AST, ALT, ALKPHOS, BILITOT, PROT, ALBUMIN in the last 168 hours. No results for input(s): LIPASE, AMYLASE in the last 168 hours. No results for input(s): AMMONIA in the last 168 hours.  CBC: Recent Labs  Lab 05/14/19 1454 05/20/19 0833 05/21/19 0445  WBC 7.4 6.8  --   HGB 8.2* 7.5* 7.6*  HCT 25.7* 24.1* 24.4*  MCV 91.5 94.9  --   PLT 121* 146*  --     Cardiac Enzymes: No results for input(s): CKTOTAL, CKMB, CKMBINDEX, TROPONINI in the last 168 hours.  BNP: Invalid input(s): POCBNP  CBG: Recent Labs  Lab 05/20/19 0745 05/20/19 1140 05/20/19 1636 05/20/19 2115 05/21/19 0753  GLUCAP 91 141* 133* 150* 49*    Microbiology: Results for orders placed or performed during the hospital encounter of 05/09/19  SARS Coronavirus 2 [facility] order, Performed in [place] hospital lab)     Status: None   Collection Time: 05/09/19  9:28 PM  Result Value Ref  Range Status   SARS Coronavirus 2 NEGATIVE NEGATIVE Final    Comment: (NOTE) If result is NEGATIVE SARS-CoV-2 target nucleic acids are NOT DETECTED. The SARS-CoV-2 RNA is generally detectable in upper and lower  respiratory specimens during the acute phase of infection. The lowest  concentration of SARS-CoV-2 viral copies this assay can detect is 250  copies / mL. A negative result does not preclude SARS-CoV-2 infection  and should not be used as the sole basis for treatment or other  patient management decisions.  A negative result may occur with  improper specimen collection / handling, submission of specimen other  than nasopharyngeal swab, presence of viral mutation(s) within the  areas targeted by this assay, and inadequate number of viral copies  (<250 copies / mL). A negative  result must be combined with clinical  observations, patient history, and epidemiological information. If result is POSITIVE SARS-CoV-2 target nucleic acids are DETECTED. The SARS-CoV-2 RNA is generally detectable in upper and lower  respiratory specimens dur ing the acute phase of infection.  Positive  results are indicative of active infection with SARS-CoV-2.  Clinical  correlation with patient history and other diagnostic information is  necessary to determine patient infection status.  Positive results do  not rule out bacterial infection or co-infection with other viruses. If result is PRESUMPTIVE POSTIVE SARS-CoV-2 nucleic acids MAY BE PRESENT.   A presumptive positive result was obtained on the submitted specimen  and confirmed on repeat testing.  While 2019 novel coronavirus  (SARS-CoV-2) nucleic acids may be present in the submitted sample  additional confirmatory testing may be necessary for epidemiological  and / or clinical management purposes  to differentiate between  SARS-CoV-2 and other Sarbecovirus currently known to infect humans.  If clinically indicated additional testing with an alternate test  methodology [phone]) is advised. The SARS-CoV-2 RNA is generally  detectable in upper and lower respiratory sp ecimens during the acute  phase of infection. The expected result is Negative. Fact Sheet for Patients:  StrictlyIdeas.no Fact Sheet for Healthcare Providers: BankingDealers.co.za This test is not yet approved or cleared by the [place] FDA and has been authorized for detection and/or diagnosis of SARS-CoV-2 by FDA under an Emergency Use Authorization (EUA).  This EUA will remain in effect (meaning this test can be used) for the duration of the COVID-19 declaration under Section 564(b)(1) of the Act, 21 U.S.C. section 360bbb-3(b)(1), unless the authorization is terminated or revoked sooner. Performed at  [facility], [address] [place], [place] [address]   C difficile quick scan w PCR reflex     Status: Abnormal   Collection Time: 05/14/19  4:51 AM   Specimen: Stool  Result Value Ref Range Status   C Diff antigen POSITIVE (A) NEGATIVE Final   C Diff toxin NEGATIVE NEGATIVE Final   C Diff interpretation Results are indeterminate. See PCR results.  Final    Comment: Performed at [facility], [address] [place], [place] [address]  C. Diff by PCR, Reflexed     Status: Abnormal   Collection Time: 05/14/19  4:51 AM  Result Value Ref Range Status   Toxigenic C. Difficile by PCR POSITIVE (A) NEGATIVE Final    Comment: Positive for toxigenic C. difficile with little to no toxin production. Only treat if clinical presentation suggests symptomatic illness. Performed at [facility], [address] [place], [place] [address]     Coagulation Studies: No results for input(s): LABPROT, INR in the last  72 hours.  Urinalysis: No results for input(s): COLORURINE, LABSPEC, PHURINE, GLUCOSEU, HGBUR, BILIRUBINUR, KETONESUR, PROTEINUR, UROBILINOGEN, NITRITE, LEUKOCYTESUR in the last 72 hours.  Invalid input(s): APPERANCEUR    Imaging: No results found.   Medications:   . sodium chloride 75 mL/hr at 05/21/19 0817   . amLODipine  10 mg Oral Daily  . atorvastatin  40 mg Oral QHS  . citalopram  20 mg Oral Daily  . clopidogrel  75 mg Oral Daily  . divalproex  500 mg Oral BID  . feeding supplement (GLUCERNA SHAKE)  237 mL Oral BID BM  . hydrALAZINE  25 mg Oral BID  . insulin aspart  0-15 Units Subcutaneous TID WC  . insulin aspart  0-5 Units Subcutaneous QHS  . levothyroxine  100 mcg Oral Q0600   And  . levothyroxine  25 mcg Oral Q0600  . lipase/protease/amylase  12,000 Units Oral TID WC  . multivitamin  1 tablet Oral QHS  . pneumococcal 23 valent vaccine  0.5 mL Intramuscular Tomorrow-1000  . sodium bicarbonate  1,300 mg Oral BID  . sodium  zirconium cyclosilicate  10 g Oral Daily  . tamsulosin  0.4 mg Oral Daily  . vancomycin  500 mg Oral QID   acetaminophen **OR** acetaminophen, hydrALAZINE, ondansetron **OR** ondansetron (ZOFRAN) IV, polyethylene glycol, promethazine  Assessment/ Plan:  [name] is a 68 y.o. black male withLeft eye prosthesis, diabetes mellitus type 2, hypertension, syncope, and chronic kidney disease stage IV followed by [facility], who was admitted to [facility] on 05/09/2019 for fall and possible seizure.   1. Acute renal failure on Chronic kidney disease stage IV with hyperkalemia and metabolic acidosis  Follows with [name]phrology. Baseline Cr 3.0/GFR 25 from 04/06/2019 History of acute renal failure requiring hemodialylsis for several months. Has been off dialysis since 10/18/18.  Chronic kidney disease secondary to diabetic nephropathy and hypertension Acute renal failure secondary to prerenal azotemia and acute blood loss.  -Creatinine slightly higher at 3.1 however this remains very close to his baseline.  Urine output was 900 cc over the preceding 24 hours.  Patient will need continued follow-up of his underlying chronic kidney disease through [facility].   2. Hyperkalemia  Potassium remains acceptable at 4.9.  Maintain the patient on Lokelma 10 g p.o. daily.  3. Anemia with chronic kidney disease/renal failure: with acute blood loss anemia.  Hemoglobin has drifted down a bit.  Consider blood transfusion for hemoglobin of 7 or less.  Consider Epogen as an outpatient. Lab Results  Component Value Date   HGB 7.6 (L) 05/21/2019   4.  Metabolic acidosis.  Continue sodium bicarbonate 1300 mg p.o. twice daily which was increased over the course of the hospitalization..   LOS: 12 Cameron Adams 8/15/202011:16 AM

## 2019-05-21 NOTE — Discharge Summary (Signed)
[facility] [place] NAME: Cameron Adams    MR#:  [id number]  DATE OF BIRTH:  01-02-1951  DATE OF ADMISSION:  05/09/2019 ADMITTING PHYSICIAN: Christel Mormon, MD  DATE OF DISCHARGE: 05/21/2019  PRIMARY CARE PHYSICIAN: [name], MD    ADMISSION DIAGNOSIS:  Fall, initial encounter [W19.XXXA] Anemia, unspecified type [D64.9] Acute renal failure superimposed on chronic kidney disease, unspecified CKD stage, unspecified acute renal failure type ([place]) [N17.9, N18.9]  DISCHARGE DIAGNOSIS:  Active Problems:   Dehydration   SECONDARY DIAGNOSIS:   Past Medical History:  Diagnosis Date  . Anemia of chronic disease   . Blindness of left eye   . Chronic kidney disease, stage III (moderate) (HCC)   . Diabetes ([place])   . Diarrhea   . Diastolic dysfunction    a. 06/2017 Echo: EF 55-60%, no rwma, Gr2 DD, Asc Ao 3.9cm, nl RV fxn, nl PASP.  Marland Kitchen Hypertension   . Hypothyroidism   . Monoclonal gammopathy of unknown significance (MGUS)    a. prev seen @ [facility].  Lost to f/u - never had BM bx.  . Orthostatic hypotension   . Syncope and collapse     HOSPITAL COURSE:   68 year old male with history of chronic diastolic heart failure, diabetes, chronic kidney disease stage IV followed at [facility] who presented to the emergency room due to fall.  1.  Acute blood loss anemia: Patient is status post EGD which showed previous bleeding from Mallory-Weiss tear.  No acute pathology.  He is status post 1 unit PRBC.  Hemoglobin remained stable.  2.  C. difficile colitis: Patient's diarrhea has improved.  He will continue on vancomycin.  Stop date of vancomycin oral is 8/19.  3.  Acute on chronic kidney disease stage IV: Patient was followed by nephrology while in hospital.  Creatinine has stabilized.  He will have outpatient follow-up with [facility] nephrology.  He has been off of dialysis since January 2020.  There was no indication for dialysis during this hospital  stay.  4.  Asymptomatic bradycardia which is improved  5.  Diabetes: Patient is ordered diabetic diet   6.  Acute metabolic encephalopathy in the setting of above issues: He is currently at baseline mental status. Ammonia level is normal. He will continue Depakote 500 twice daily  7.  Hyperkalemia from acute kidney injury: This has improved  Physical therapy has recommended skilled nursing facility upon discharge.  DISCHARGE CONDITIONS AND DIET:  Stable Renal diabetic diet  CONSULTS OBTAINED:  Treatment Team:  Neville Route, MD  DRUG ALLERGIES:   Allergies  Allergen Reactions  . Gabapentin     Other reaction(s): Other (See Comments) Trouble sleeping     DISCHARGE MEDICATIONS:   Allergies as of 05/21/2019      Reactions   Gabapentin    Other reaction(s): Other (See Comments) Trouble sleeping       Medication List    STOP taking these medications   hydrOXYzine 10 MG tablet Commonly known as: ATARAX/VISTARIL   loperamide 2 MG capsule Commonly known as: IMODIUM   sulfamethoxazole-trimethoprim 800-160 MG tablet Commonly known as: BACTRIM DS     TAKE these medications   acetaminophen 500 MG tablet Commonly known as: TYLENOL Take 500-1,000 mg by mouth every 6 (six) hours as needed for mild pain, moderate pain or fever.   acidophilus Caps capsule Take 1 capsule by mouth daily.   amLODipine 10 MG tablet Commonly known as: NORVASC Take 1 tablet (10  mg total) by mouth daily.   aspirin EC 81 MG tablet Take 81 mg by mouth daily.   atorvastatin 40 MG tablet Commonly known as: LIPITOR Take 40 mg by mouth at bedtime.   citalopram 20 MG tablet Commonly known as: CELEXA Take 20 mg by mouth daily.   clopidogrel 75 MG tablet Commonly known as: Plavix Take 1 tablet (75 mg total) by mouth daily.   Creon 24000-76000 units Cpep Generic drug: Pancrelipase (Lip-Prot-Amyl) Take 48,000 Units by mouth 3 (three) times daily with meals.   divalproex 500 MG 24 hr  tablet Commonly known as: DEPAKOTE ER Take 1 tablet (500 mg total) by mouth 2 (two) times daily.   glipiZIDE 5 MG tablet Commonly known as: GLUCOTROL Take 0.5 tablets (2.5 mg total) by mouth daily before breakfast. What changed: when to take this   levothyroxine 100 MCG tablet Commonly known as: SYNTHROID Take 1 tablet (100 mcg total) by mouth daily before breakfast.   metoprolol tartrate 25 MG tablet Commonly known as: LOPRESSOR Take 1.5 tablets (37.5 mg total) by mouth 2 (two) times daily.   multivitamin with minerals Tabs tablet Take 1 tablet by mouth daily.   sodium bicarbonate 650 MG tablet Take 650 mg by mouth 2 (two) times daily.   tamsulosin 0.4 MG Caps capsule Commonly known as: FLOMAX Take 1 capsule (0.4 mg total) by mouth daily.   vancomycin 50 mg/mL  oral solution Commonly known as: VANCOCIN Take 2.5 mLs (125 mg total) by mouth every 6 (six) hours for 4 days.   Vitamin D-1000 Max St 25 MCG (1000 UT) tablet Generic drug: Cholecalciferol Take 2,000 Units by mouth daily.         Today   CHIEF COMPLAINT:  Doing well this morning.  No acute issues.  Diarrhea has improved.  No abdominal pain.  Daughter is at bedside.   VITAL SIGNS:  Blood pressure (!) 156/64, pulse 70, temperature 98.7 F (37.1 C), temperature source Oral, resp. rate 16, height 5\' 11"  (1.803 m), weight 91.2 kg, SpO2 90 %.   REVIEW OF SYSTEMS:  Review of Systems  Constitutional: Negative.  Negative for chills, fever and malaise/fatigue.  HENT: Negative.  Negative for ear discharge, ear pain, hearing loss, nosebleeds and sore throat.   Eyes: Negative.  Negative for blurred vision and pain.  Respiratory: Negative.  Negative for cough, hemoptysis, shortness of breath and wheezing.   Cardiovascular: Negative.  Negative for chest pain, palpitations and leg swelling.  Gastrointestinal: Positive for diarrhea (better). Negative for abdominal pain, blood in stool, nausea and vomiting.   Genitourinary: Negative.  Negative for dysuria.  Musculoskeletal: Negative.  Negative for back pain.  Skin: Negative.   Neurological: Negative for dizziness, tremors, speech change, focal weakness, seizures and headaches.  Endo/Heme/Allergies: Negative.  Does not bruise/bleed easily.  Psychiatric/Behavioral: Negative.  Negative for depression, hallucinations and suicidal ideas.     PHYSICAL EXAMINATION:  GENERAL:  68 y.o.-year-old patient lying in the bed with no acute distress.  NECK:  Supple, no jugular venous distention. No thyroid enlargement, no tenderness.  LUNGS: Normal breath sounds bilaterally, no wheezing, rales,rhonchi  No use of accessory muscles of respiration.  CARDIOVASCULAR: S1, S2 normal. No murmurs, rubs, or gallops.  ABDOMEN: Soft, non-tender, non-distended. Bowel sounds present. No organomegaly or mass.  EXTREMITIES: 1+ edema, nocyanosis, or clubbing.  PSYCHIATRIC: The patient is alert and oriented x 3.  SKIN: No obvious rash, lesion, or ulcer.  Scrotal edema  DATA REVIEW:   CBC Recent Labs  Lab 05/20/19 0833 05/21/19 0445  WBC 6.8  --   HGB 7.5* 7.6*  HCT 24.1* 24.4*  PLT 146*  --     Chemistries  Recent Labs  Lab 05/21/19 0445  NA 144  K 4.9  CL 120*  CO2 19*  GLUCOSE 82  BUN 45*  CREATININE 3.10*  CALCIUM 7.5*    Cardiac Enzymes No results for input(s): TROPONINI in the last 168 hours.  Microbiology Results  @MICRORSLT48 @  RADIOLOGY:  No results found.    Allergies as of 05/21/2019      Reactions   Gabapentin    Other reaction(s): Other (See Comments) Trouble sleeping       Medication List    STOP taking these medications   hydrOXYzine 10 MG tablet Commonly known as: ATARAX/VISTARIL   loperamide 2 MG capsule Commonly known as: IMODIUM   sulfamethoxazole-trimethoprim 800-160 MG tablet Commonly known as: BACTRIM DS     TAKE these medications   acetaminophen 500 MG tablet Commonly known as: TYLENOL Take 500-1,000 mg  by mouth every 6 (six) hours as needed for mild pain, moderate pain or fever.   acidophilus Caps capsule Take 1 capsule by mouth daily.   amLODipine 10 MG tablet Commonly known as: NORVASC Take 1 tablet (10 mg total) by mouth daily.   aspirin EC 81 MG tablet Take 81 mg by mouth daily.   atorvastatin 40 MG tablet Commonly known as: LIPITOR Take 40 mg by mouth at bedtime.   citalopram 20 MG tablet Commonly known as: CELEXA Take 20 mg by mouth daily.   clopidogrel 75 MG tablet Commonly known as: Plavix Take 1 tablet (75 mg total) by mouth daily.   Creon 24000-76000 units Cpep Generic drug: Pancrelipase (Lip-Prot-Amyl) Take 48,000 Units by mouth 3 (three) times daily with meals.   divalproex 500 MG 24 hr tablet Commonly known as: DEPAKOTE ER Take 1 tablet (500 mg total) by mouth 2 (two) times daily.   glipiZIDE 5 MG tablet Commonly known as: GLUCOTROL Take 0.5 tablets (2.5 mg total) by mouth daily before breakfast. What changed: when to take this   levothyroxine 100 MCG tablet Commonly known as: SYNTHROID Take 1 tablet (100 mcg total) by mouth daily before breakfast.   metoprolol tartrate 25 MG tablet Commonly known as: LOPRESSOR Take 1.5 tablets (37.5 mg total) by mouth 2 (two) times daily.   multivitamin with minerals Tabs tablet Take 1 tablet by mouth daily.   sodium bicarbonate 650 MG tablet Take 650 mg by mouth 2 (two) times daily.   tamsulosin 0.4 MG Caps capsule Commonly known as: FLOMAX Take 1 capsule (0.4 mg total) by mouth daily.   vancomycin 50 mg/mL  oral solution Commonly known as: VANCOCIN Take 2.5 mLs (125 mg total) by mouth every 6 (six) hours for 4 days.   Vitamin D-1000 Max St 25 MCG (1000 UT) tablet Generic drug: Cholecalciferol Take 2,000 Units by mouth daily.          Management plans discussed with the patient and daughter and they ares in agreement. Stable for discharge snf  Patient should follow up with pcp  CODE STATUS:      Code Status Orders  (From admission, onward)         Start     Ordered   05/09/19 2104  Full code  Continuous     05/09/19 2103        Code Status History    Date Active Date Inactive Code Status Order ID  Comments User Context   04/24/2019 1641 04/30/2019 1858 Full Code [id number]  Sela Hua, MD ED   01/12/2019 1802 01/16/2019 1509 Full Code [id number]  Dustin Flock, MD ED   12/04/2018 1353 12/08/2018 2140 Full Code [id number]  Salary, Avel Peace, MD ED   06/22/2017 1735 06/29/2017 2042 Full Code [id number]  Vaughan Basta, MD Inpatient   06/16/2017 1849 06/19/2017 1841 Full Code [id number]  Vaughan Basta, MD Inpatient   05/17/2017 1550 05/20/2017 1744 Full Code [id number]  Idelle Crouch, MD Inpatient   09/13/2016 1756 09/16/2016 1856 Full Code [id number]  Fritzi Mandes, MD Inpatient   Advance Care Planning Activity    Advance Directive Documentation     Most Recent Value  Type of Advance Directive  Healthcare Power of Attorney  Pre-existing out of facility DNR order (yellow form or pink MOST form)  -  "MOST" Form in Place?  -      TOTAL TIME TAKING CARE OF THIS PATIENT: 39 minutes.    Note: This dictation was prepared with Dragon dictation along with smaller phrase technology. Any transcriptional errors that result from this process are unintentional.  Bettey Costa M.D on 05/21/2019 at 11:28 AM  Between 7am to 6pm - Pager - [phone] After 6pm go to [email or web address] - password [email or web address] [facility]  Office  [phone]  CC: Primary care physician; [name], MD

## 2019-05-21 NOTE — TOC Transition Note (Signed)
Transition of Care [facility]) - CM/SW Discharge Note   Patient Details  Name: MYKEAL CARRICK MRN: [id number] Date of Birth: [birth year]  Transition of [facility]) CM/SW Contact:  Latanya Maudlin, RN Phone Number: 05/21/2019, 1:53 PM   Clinical Narrative:  Patient cleared to return to [facility] today. Per Otil[facility] Kluver at [facility] can accept Mr Ohalloran back today. Patient will go to room 805. Bedside RN to collect updated COVID test. Will use EMS. Number for report placed on chart and bedside RN will call report once COVID results are back.    Final next level of care: Skilled Nursing Facility Barriers to Discharge: No Barriers Identified   Patient Goals and CMS Choice Patient states their goals for this hospitalization and ongoing recovery are:: Patient not oriented.   Choice offered to / list presented to : NA  Discharge Placement                Patient to be transferred to facility by: EMS      Discharge Plan and Services     Post Acute Care Choice: Skilled Nursing Facility                               Social Determinants of Health (SDOH) Interventions     Readmission Risk Interventions Readmission Risk Prevention Plan 04/26/2019 01/16/2019  Transportation Screening Complete Complete  PCP or Specialist Appt within 3-5 Days - Complete  HRI or [place] - Complete  Social Work Consult for [place] Planning/Counseling - Complete  Palliative Care Screening - Not Applicable  Medication Review Press photographer) Complete Complete  HRI or Home Care Consult Complete -  SW Recovery Care/Counseling Consult Complete -  Palliative Care Screening Not Applicable -  [place] Patient Refused -  Some recent data might be hidden

## 2019-05-25 ENCOUNTER — Non-Acute Institutional Stay: Payer: Medicare Other | Admitting: Primary Care

## 2019-05-25 ENCOUNTER — Other Ambulatory Visit: Payer: Self-pay

## 2019-05-25 DIAGNOSIS — Z515 Encounter for palliative care: Secondary | ICD-10-CM

## 2019-05-25 NOTE — Progress Notes (Signed)
[facility] Palliative Care Consult Note Telephone: [phone]  Fax: [phone]  TELEHEALTH VISIT STATEMENT Due to the COVID-19 crisis, this visit was done via telemedicine from my office. It was initiated and consented to by this patient and/or family.  PATIENT NAME: Cameron Adams DOB: Oct 28, 1950 MRN: [id number]  PRIMARY CARE PROVIDER:   Juluis Pitch, MD, [address] [place] [address] [phone]  REFERRING PROVIDER:  Juluis Pitch, MD [address] [place],  [place] [address] [phone]  RESPONSIBLE PARTY:   Extended Emergency Contact Information Primary Emergency Contact: Vashti Hey Mobile Phone: [phone] Relation: Daughter Secondary Emergency Contact: Miles,Tenika Address: [address]          [place], [place] [id number] Johnnette Litter of [place] Phone: [phone] Relation: Daughter   ASSESSMENT AND RECOMMENDATIONS:   1. Advance Care Planning/Goals of Care: Goals include to maximize quality of life and symptom management. I will follow up with family to discuss patient's goals of care and treatment preferences. DNR was charted from palliative NP visit in June, 2020.  2. Symptom Management:   Seizure Activity: Recent exacerbation, has increased anti- convulsant. Had grand mal seizure per nursing report which caused him to slip out of his chair. Prior home palliative NP documented in June 2020  some sort of seizure activity reported for 10-15 min when he has clonus and then is not alert.  Hypotension: Has had drops esp with rising to 90/40. He has reported orthostasis.  Recommendations have been for compression stockings which may help the edema. He is a risk to diurese due to orthostasis.   Mobility: Undergoing PT rehab at [facility].  Had been living at home, now at [facility] for rehab after falls associated with seizures.  3. Family /Caregiver/Community Supports: Til July 2020  lived at home alone, reported some weakness on left  arm and leg 14 months ago. March 2020 he started having episodes of weakness, bp fluctuation,facial drooping and tongue protrusion with slurred speech lasting 10 min. No stroke or seizure activity was found. From March to now 500 mg depakote bid controlled seizures. I will explore long term living plans with daughters.  4. Cognitive / Functional decline:  Drowsy on interview, able to answer questions but falls asleep quickly. AT SNF for PT.  5. Follow up Palliative Care Visit: Palliative care will continue to follow for goals of care clarification and symptom management. Return 2-4 weeks or prn.  I spent 25 minutes providing this consultation,  from 1430 to 1455. More than 50% of the time in this consultation was spent coordinating communication.   HISTORY OF PRESENT ILLNESS:  Cameron Adams is a 68 y.o. year old male with multiple medical problems including seizure disorder, left eye blindness, chronic kidney disease stage III. Palliative Care was asked to follow this patient by consultation request of Juluis Pitch, MD to help address advance care planning and goals of care. This is a follow up visit.  CODE STATUS: MOST says POA, Full scope of treatment. Currently full code per SNF.  PPS: 30% HOSPICE ELIGIBILITY/DIAGNOSIS: TBD  PAST MEDICAL HISTORY:  Past Medical History:  Diagnosis Date  . Anemia of chronic disease   . Blindness of left eye   . Chronic kidney disease, stage III (moderate) (HCC)   . Diabetes ([place])   . Diarrhea   . Diastolic dysfunction    a. 06/2017 Echo: EF 55-60%, no rwma, Gr2 DD, Asc Ao 3.9cm, nl RV fxn, nl PASP.  Marland Kitchen Hypertension   .  Hypothyroidism   . Monoclonal gammopathy of unknown significance (MGUS)    a. prev seen @ [facility].  Lost to f/u - never had BM bx.  . Orthostatic hypotension   . Syncope and collapse     SOCIAL HX:  Social History   Tobacco Use  . Smoking status: Former Smoker    Packs/day: 0.10    Years: 25.00    Pack years: 2.50    Types:  Cigarettes    Quit date: 10/31/1978    Years since quitting: 40.5  . Smokeless tobacco: Never Used  . Tobacco comment: smoked 2-3 cigarettes/day  Substance Use Topics  . Alcohol use: Not Currently    Comment: previously drank heavily but not in many years    ALLERGIES:  Allergies  Allergen Reactions  . Gabapentin     Other reaction(s): Other (See Comments) Trouble sleeping      PERTINENT MEDICATIONS:  Outpatient Encounter Medications as of 05/25/2019  Medication Sig  . acetaminophen (TYLENOL) 500 MG tablet Take 500-1,000 mg by mouth every 6 (six) hours as needed for mild pain, moderate pain or fever.  Marland Kitchen acidophilus (RISAQUAD) CAPS capsule Take 1 capsule by mouth daily.  Marland Kitchen amLODipine (NORVASC) 10 MG tablet Take 1 tablet (10 mg total) by mouth daily.  Marland Kitchen aspirin EC 81 MG tablet Take 81 mg by mouth daily.  Marland Kitchen atorvastatin (LIPITOR) 40 MG tablet Take 40 mg by mouth at bedtime.  . Cholecalciferol (VITAMIN D-1000 MAX ST) 25 MCG (1000 UT) tablet Take 2,000 Units by mouth daily.   . citalopram (CELEXA) 20 MG tablet Take 20 mg by mouth daily.  . clopidogrel (PLAVIX) 75 MG tablet Take 1 tablet (75 mg total) by mouth daily.  . divalproex (DEPAKOTE ER) 500 MG 24 hr tablet Take 1 tablet (500 mg total) by mouth 2 (two) times daily.  Marland Kitchen glipiZIDE (GLUCOTROL) 5 MG tablet Take 0.5 tablets (2.5 mg total) by mouth daily before breakfast.  . levothyroxine (SYNTHROID, LEVOTHROID) 100 MCG tablet Take 1 tablet (100 mcg total) by mouth daily before breakfast.  . metoprolol tartrate (LOPRESSOR) 25 MG tablet Take 1.5 tablets (37.5 mg total) by mouth 2 (two) times daily.  . Multiple Vitamin (MULTIVITAMIN WITH MINERALS) TABS tablet Take 1 tablet by mouth daily.  . Pancrelipase, Lip-Prot-Amyl, (CREON) 24000-76000 units CPEP Take 48,000 Units by mouth 3 (three) times daily with meals.  . sodium bicarbonate 650 MG tablet Take 650 mg by mouth 2 (two) times daily.  . tamsulosin (FLOMAX) 0.4 MG CAPS capsule Take 1  capsule (0.4 mg total) by mouth daily.  . vancomycin (VANCOCIN) 50 mg/mL oral solution Take 2.5 mLs (125 mg total) by mouth every 6 (six) hours for 4 days.   No facility-administered encounter medications on file as of 05/25/2019.     PHYSICAL EXAM / ROS:  O2 sat =67% during seizures,  bg= 183 at time of seizures, on depakote already at that time. Documented 153 lbs in 01/2019. Current and past weights: increased weight due to significant anasarca HEENT; swollen right eye due to fall from chair from seizure.  General: NAD,appears nourished, drowsy Cardiovascular: no chest pain reported, anasarca edema, 2-3+ edema including face, scrotum, lips, seems to be resolving.  Pulmonary: no cough, no increased SOB, oxygen due to seizures Abdomen: cannot feed self., appetite 51-75%, denies constipation, incontinent of bowel, no more loose stools however, was Rx for C diff., GU: denies dysuria, incontinent of urine, CKD IV, he is urinating. MSK:  no joint deformities, non-ambulatory, trying  to get oob due to confusion Skin: no rashes or wounds reported Neurological: Weakness, seizures, AMS, hallucinations, Grand mal 48 hours ago, some probable absence seizures today,sleeping a lot. [place] out of chair during seizure x 2.  Cyndia Skeeters DNP AGPCNP-BC

## 2019-05-30 ENCOUNTER — Other Ambulatory Visit: Payer: Self-pay

## 2019-05-30 ENCOUNTER — Non-Acute Institutional Stay: Payer: Medicare Other | Admitting: Primary Care

## 2019-05-30 ENCOUNTER — Telehealth: Payer: Self-pay | Admitting: Primary Care

## 2019-05-30 NOTE — Telephone Encounter (Signed)
 Call from nursing home, patient expired at 1245 am this morning, .

## 2019-06-07 DEATH — deceased

## 2021-04-01 IMAGING — US BILATERAL CAROTID DUPLEX ULTRASOUND
1 series · 13 of 24 positions shown · non-contrast
Comparison: Carotid Doppler ultrasound-12/04/2018; 09/14/2016

CLINICAL DATA: Syncopal episode. History of TIA, hypertension,
hyperlipidemia and diabetes.

EXAM:
BILATERAL CAROTID DUPLEX ULTRASOUND
TECHNIQUE: Gray scale imaging, color Doppler and duplex ultrasound were
performed of bilateral carotid and vertebral arteries in the neck.

[Series 1: bilateral carotid duplex ultrasound · 0.06mm/px · 13 of 69 slices shown]
[im 1/69]
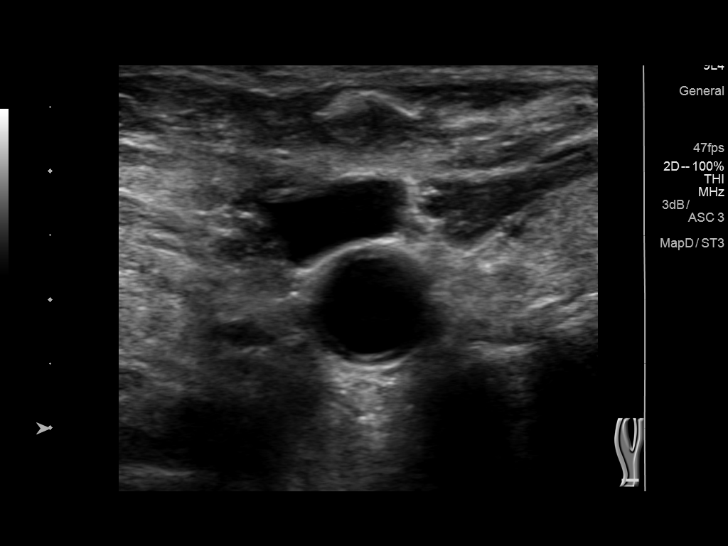
[im 6/69]
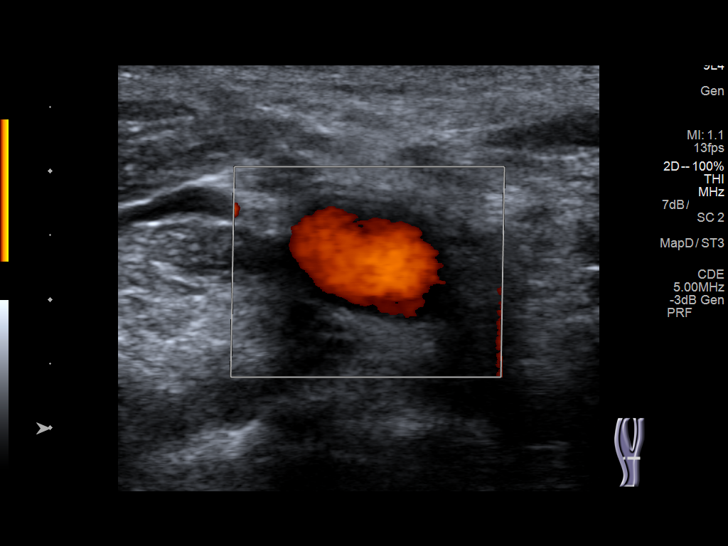
[im 12/69]
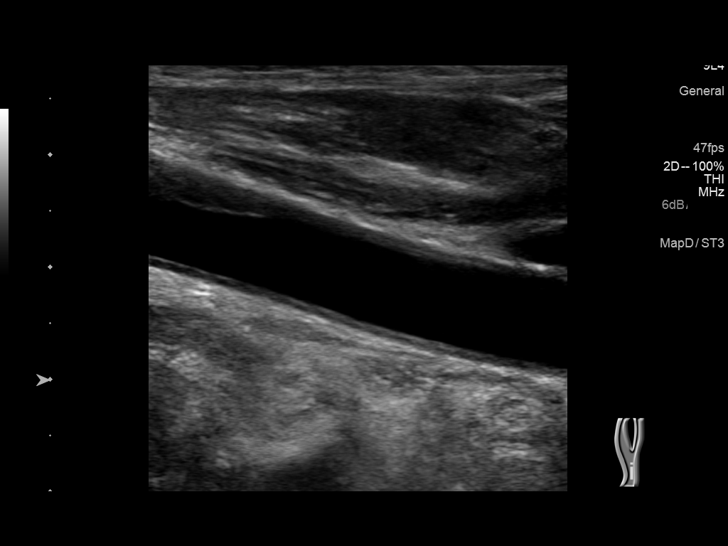
[im 18/69]
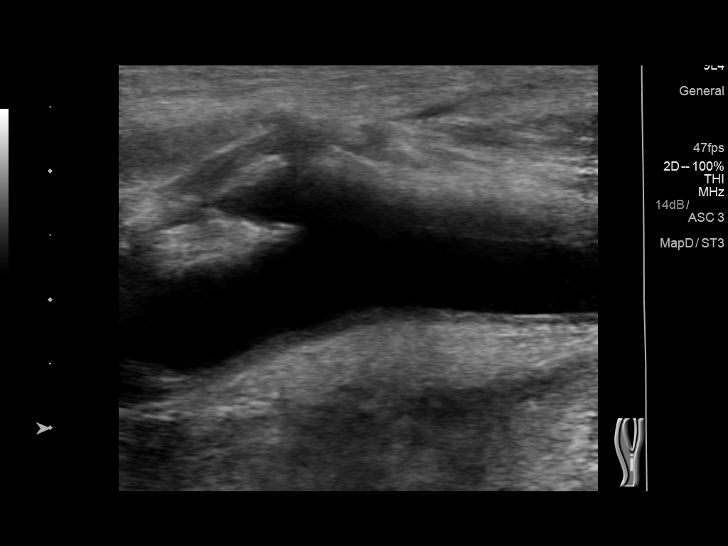
[im 24/69]
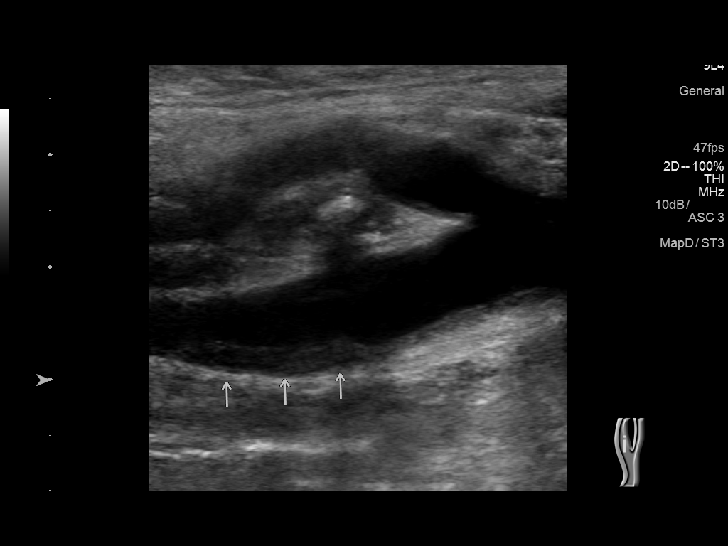
[im 30/69]
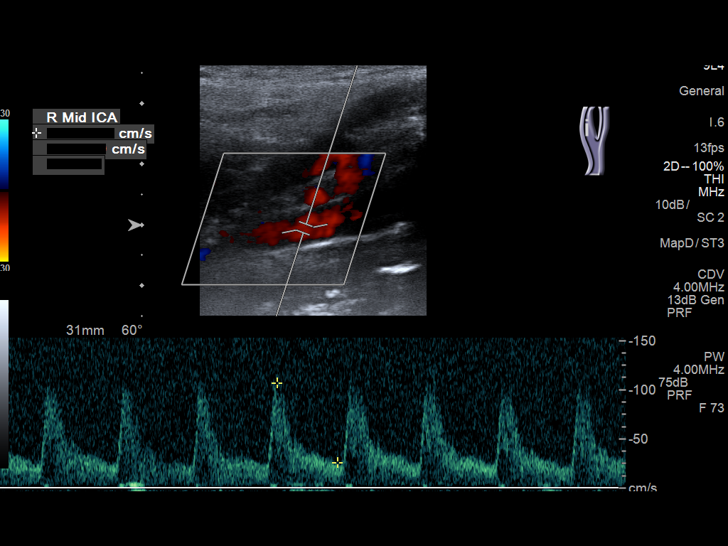
[im 36/69]
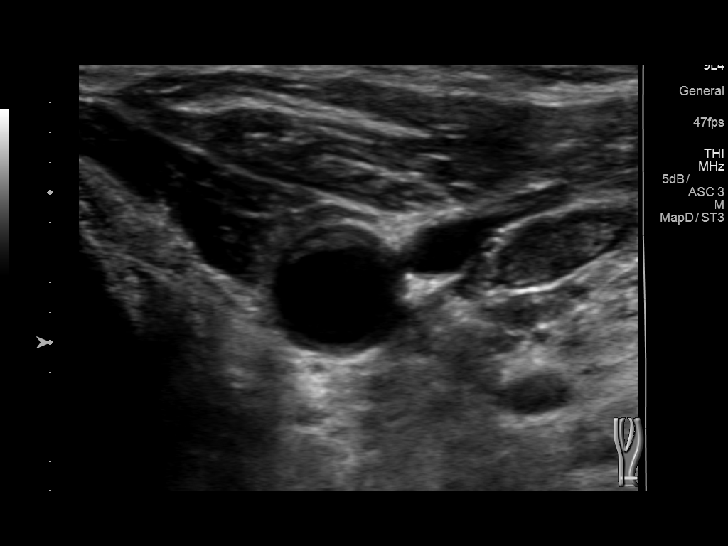
[im 39/69]
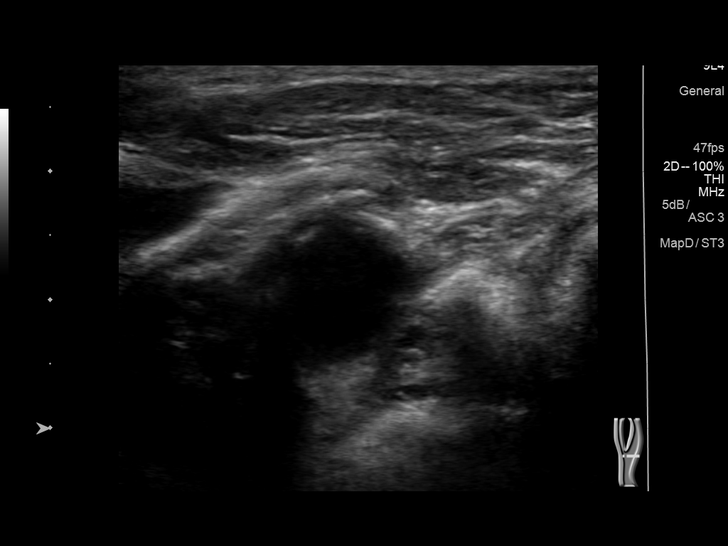
[im 45/69]
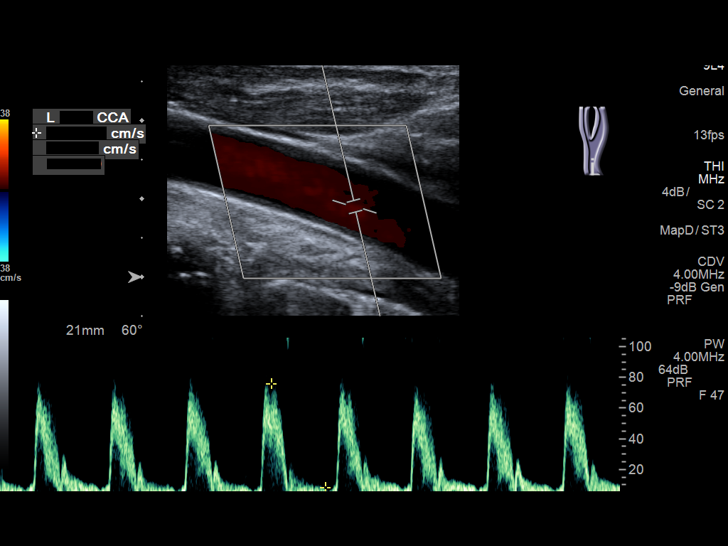
[im 51/69]
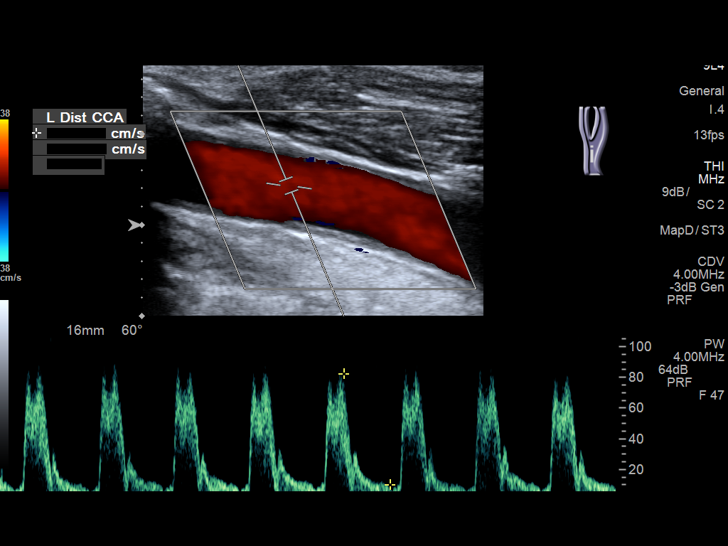
[im 57/69]
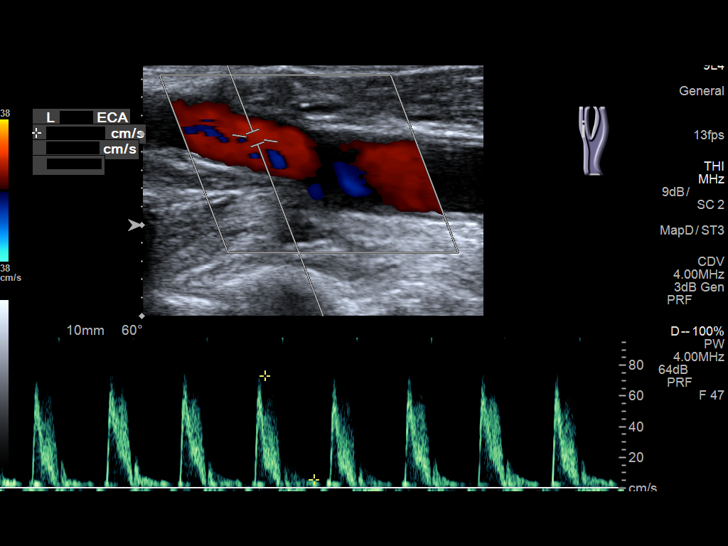
[im 63/69]
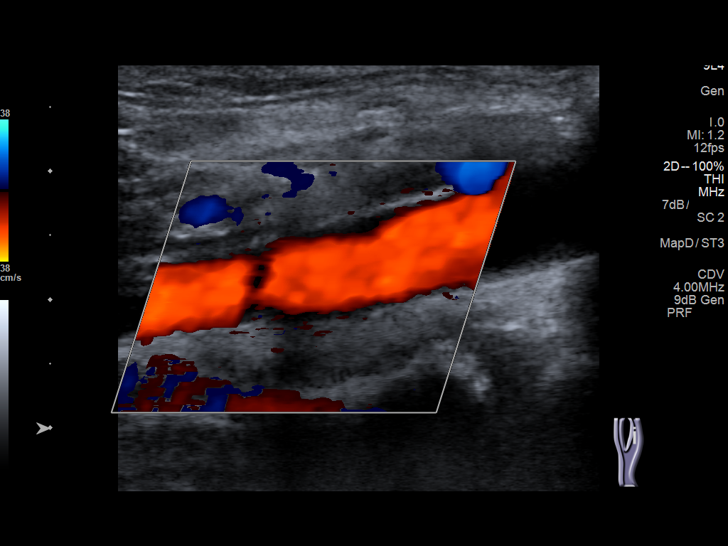
[im 69/69]
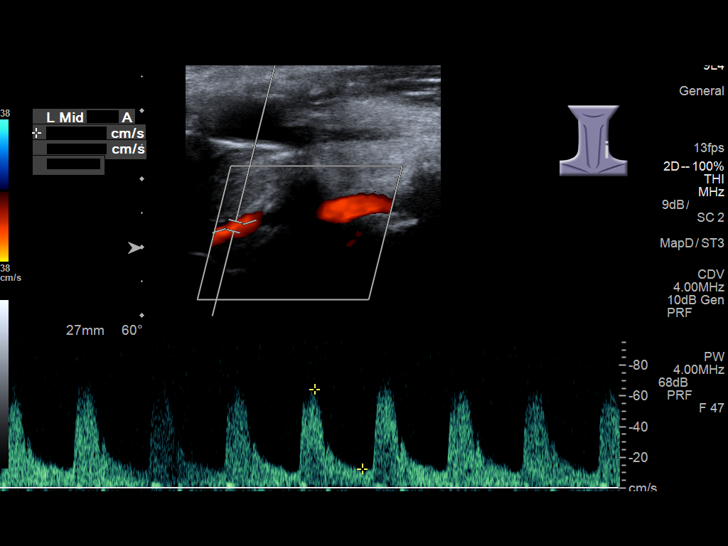

[13 of 24 positions shown; findings below may reference images not displayed]

FINDINGS: Criteria: Quantification of carotid stenosis is based on velocity
parameters that correlate the residual internal carotid diameter
with NASCET-based stenosis levels, using the diameter of the distal
internal carotid lumen as the denominator for stenosis measurement.

The following velocity measurements were obtained:

RIGHT

ICA: 149/27 cm/sec

CCA: 93/12 cm/sec

SYSTOLIC ICA/CCA RATIO:

ECA: 107 cm/sec

LEFT

ICA: 82/11 cm/sec

CCA: 82/10 cm/sec

SYSTOLIC ICA/CCA RATIO:

ECA: 73 cm/sec

RIGHT CAROTID ARTERY: There is a moderate amount of eccentric near
circumferential hypoechoic plaque within distal aspect the right
common carotid artery (image 4 and 16), extending to involve the
right carotid bulb (image 19). There is a moderate amount of near
circumferential hypoechoic plaque involving the proximal aspect the
right internal carotid artery (image 25), which now results in
elevated peak systolic velocities within the proximal aspect the
right internal carotid artery. Greatest acquired peak systolic
velocity with the proximal right ICA measures 149 centimeters/second
(image 27) previously, greatest acquired peak systolic velocity
measured 119 cm cm/second.

RIGHT VERTEBRAL ARTERY:  Antegrade Flow

LEFT CAROTID ARTERY: There is a large amount of eccentric echogenic
plaque involving the proximal aspect the left internal carotid
artery (image 59 and 60), progressed compared to the [DATE]
examination, though not resulting in elevated peak systolic
velocities within left internal carotid artery to suggest a
hemodynamically significant stenosis.

LEFT VERTEBRAL ARTERY:  Antegrade flow
IMPRESSION: 1. Moderate amount of right-sided atherosclerotic plaque, progressed
compared to the [DATE] examination, and now results in elevated
peak systolic velocities with the right internal carotid artery
compatible with the 50-69% luminal narrowing range. Further
evaluation with CTA could performed as clinically indicated.
2. Large amount of left-sided atherosclerotic plaque, progressed
compared to the [DATE] examination, though not definitely resulting
in a hemodynamically significant stenosis.
